# Patient Record
Sex: Female | Born: 1960 | Race: White | Hispanic: No | Marital: Single | Smoking: Current every day smoker
Health system: Southern US, Community
[De-identification: ages and names within clinical notes are randomized; demographics above are authoritative.]

## PROBLEM LIST (undated history)

## (undated) DIAGNOSIS — K5792 Diverticulitis of intestine, part unspecified, without perforation or abscess without bleeding: Secondary | ICD-10-CM

## (undated) DIAGNOSIS — J387 Other diseases of larynx: Secondary | ICD-10-CM

## (undated) DIAGNOSIS — M1990 Unspecified osteoarthritis, unspecified site: Secondary | ICD-10-CM

## (undated) DIAGNOSIS — K219 Gastro-esophageal reflux disease without esophagitis: Secondary | ICD-10-CM

## (undated) DIAGNOSIS — E039 Hypothyroidism, unspecified: Secondary | ICD-10-CM

## (undated) DIAGNOSIS — I10 Essential (primary) hypertension: Secondary | ICD-10-CM

## (undated) HISTORY — DX: Other diseases of larynx: J38.7

## (undated) HISTORY — PX: CHOLECYSTECTOMY: SHX55

## (undated) HISTORY — PX: APPENDECTOMY: SHX54

## (undated) HISTORY — PX: ABDOMINAL HYSTERECTOMY: SHX81

---

## 2005-08-16 ENCOUNTER — Encounter (INDEPENDENT_AMBULATORY_CARE_PROVIDER_SITE_OTHER): Payer: Self-pay | Admitting: *Deleted

## 2005-08-16 ENCOUNTER — Ambulatory Visit: Payer: Self-pay | Admitting: Gynecology

## 2005-08-16 ENCOUNTER — Ambulatory Visit (HOSPITAL_COMMUNITY): Admission: RE | Admit: 2005-08-16 | Discharge: 2005-08-16 | Payer: Self-pay | Admitting: Gynecology

## 2005-08-16 ENCOUNTER — Encounter: Payer: Self-pay | Admitting: Family Medicine

## 2005-09-06 ENCOUNTER — Ambulatory Visit: Payer: Self-pay | Admitting: Gynecology

## 2006-02-26 ENCOUNTER — Emergency Department (HOSPITAL_COMMUNITY): Admission: EM | Admit: 2006-02-26 | Discharge: 2006-02-27 | Payer: Self-pay | Admitting: Emergency Medicine

## 2006-11-10 ENCOUNTER — Ambulatory Visit: Payer: Self-pay | Admitting: Gynecology

## 2007-03-06 ENCOUNTER — Ambulatory Visit: Payer: Self-pay | Admitting: Gynecology

## 2007-07-11 ENCOUNTER — Emergency Department: Payer: Self-pay | Admitting: Emergency Medicine

## 2007-07-11 ENCOUNTER — Other Ambulatory Visit: Payer: Self-pay

## 2008-03-25 ENCOUNTER — Ambulatory Visit: Payer: Self-pay | Admitting: Gynecology

## 2009-02-02 ENCOUNTER — Inpatient Hospital Stay (HOSPITAL_COMMUNITY): Admission: AD | Admit: 2009-02-02 | Discharge: 2009-02-03 | Payer: Self-pay | Admitting: Obstetrics & Gynecology

## 2009-02-03 ENCOUNTER — Encounter: Payer: Self-pay | Admitting: Family Medicine

## 2009-02-03 ENCOUNTER — Ambulatory Visit: Payer: Self-pay | Admitting: Family Medicine

## 2009-02-03 LAB — CONVERTED CEMR LAB
Amylase: 50 units/L (ref 0–105)
Lipase: 42 units/L (ref 0–75)

## 2009-02-05 ENCOUNTER — Encounter: Payer: Self-pay | Admitting: Family Medicine

## 2009-02-05 LAB — CONVERTED CEMR LAB: T3, Free: 2.6 pg/mL (ref 2.3–4.2)

## 2009-03-19 ENCOUNTER — Ambulatory Visit: Payer: Self-pay | Admitting: Internal Medicine

## 2009-04-30 ENCOUNTER — Encounter: Payer: Self-pay | Admitting: Family Medicine

## 2009-04-30 LAB — CONVERTED CEMR LAB: TSH: 4.998 microintl units/mL

## 2009-05-05 ENCOUNTER — Encounter: Payer: Self-pay | Admitting: Family Medicine

## 2009-05-05 ENCOUNTER — Ambulatory Visit: Payer: Self-pay | Admitting: Obstetrics and Gynecology

## 2009-05-05 LAB — CONVERTED CEMR LAB: T3, Free: 0.91 pg/mL

## 2010-02-26 ENCOUNTER — Encounter: Payer: Self-pay | Admitting: Family Medicine

## 2010-02-26 LAB — CONVERTED CEMR LAB: TSH: 0.52 microintl units/mL (ref 0.350–4.500)

## 2010-03-03 ENCOUNTER — Ambulatory Visit: Payer: Self-pay | Admitting: Family Medicine

## 2010-03-03 DIAGNOSIS — Z9189 Other specified personal risk factors, not elsewhere classified: Secondary | ICD-10-CM

## 2010-03-03 DIAGNOSIS — E039 Hypothyroidism, unspecified: Secondary | ICD-10-CM | POA: Insufficient documentation

## 2010-03-03 DIAGNOSIS — I10 Essential (primary) hypertension: Secondary | ICD-10-CM | POA: Insufficient documentation

## 2010-03-03 DIAGNOSIS — K921 Melena: Secondary | ICD-10-CM

## 2010-03-03 DIAGNOSIS — F411 Generalized anxiety disorder: Secondary | ICD-10-CM | POA: Insufficient documentation

## 2010-03-16 ENCOUNTER — Encounter: Payer: Self-pay | Admitting: Family Medicine

## 2010-05-24 ENCOUNTER — Inpatient Hospital Stay (HOSPITAL_COMMUNITY): Admission: EM | Admit: 2010-05-24 | Discharge: 2010-05-26 | Payer: Self-pay | Source: Home / Self Care

## 2010-05-25 ENCOUNTER — Encounter (INDEPENDENT_AMBULATORY_CARE_PROVIDER_SITE_OTHER): Payer: Self-pay

## 2010-06-02 ENCOUNTER — Emergency Department (HOSPITAL_COMMUNITY)
Admission: EM | Admit: 2010-06-02 | Discharge: 2010-06-02 | Payer: Self-pay | Source: Home / Self Care | Admitting: Emergency Medicine

## 2010-07-07 NOTE — Miscellaneous (Signed)
  Clinical Lists Changes  Observations: Added new observation of TSH: 0.520 microintl units/mL (02/26/2010 10:05) Added new observation of T3 FREE: 0.91 pg/mL (05/05/2009 10:05) Added new observation of TSH: 4.998 microintl units/mL (04/30/2009 10:05) Added new observation of TSH: 6.614 microintl units/mL (02/03/2009 10:05)

## 2010-07-07 NOTE — Assessment & Plan Note (Signed)
Summary: NEW PT TO EST/THROAT PAIN/CLE   Vital Signs:  Patient profile:   50 year old female Height:      58 inches Weight:      120.75 pounds BMI:     25.33 Temp:     98.4 degrees F oral Pulse rate:   88 / minute Pulse rhythm:   regular BP sitting:   130 / 88  (left arm) Cuff size:   regular  Vitals Entered By: Delilah Shan CMA Duncan Dull) (March 03, 2010 9:32 AM) CC: New Patient to Establish   History of Present Illness: Blood in stool, happened 3 weeks ago. No pain with episode.  1 prev episode.  Occurred with straining for BM.   MDD- prev on effexor.  Had good effect.  Off the med, patient is fatigued.  No SI/HI.  "I did better on it."  H/o hypothyroidism, recent TSH wnl.    Pain throat down into chest, happens every other day.  Lasts about 2 hours.  +nausea w/o vomiting.  Feel hot with the episodes.  L arm and L leg feel "different when it happens."  Can happen at rest.  Nothing makes it better.  She can exercise w/o pain.  Irregular patter- "it happens when it happens."  Has been going on about 1.5 months.  No motor changes.  No h/o CAD.  No help with tums, aleve.  Taking BC powder every AM.    Berneta Levins off smoking.    Preventive Screening-Counseling & Management  Alcohol-Tobacco     Smoking Status: current  Caffeine-Diet-Exercise     Does Patient Exercise: yes      Drug Use:  no.    Problems Prior to Update: 1)  Anxiety  (ICD-300.00) 2)  Chest Pain, Atypical, Hx of  (ICD-V15.89) 3)  Blood in Stool  (ICD-578.1) 4)  Hypothyroidism  (ICD-244.9) 5)  Hypertension  (ICD-401.9)  Current Medications (verified): 1)  Levothyroxine Sodium 50 Mcg Tabs (Levothyroxine Sodium) .... Take 1 Tablet By Mouth Once A Day 2)  Effexor Xr 37.5 Mg Xr24h-Cap (Venlafaxine Hcl) .... Take 1 Capsule By Mouth Once A Day 3)  Premarin 1.25 Mg Tabs (Estrogens Conjugated) .... Take 1 Tablet By Mouth Once A Day  Allergies (verified): No Known Drug Allergies  Past History:  Past Medical  History: HYPOTHYROIDISM (ICD-244.9) HYPERTENSION (ICD-401.9) Depression- prev in citalopram, changed to effexor.   Menopausal symptoms on HRT per Dr. Shawnie Pons tobacco abuse likely GERD  Past Surgical History: 2006   Hysterectomy  Family History: Reviewed history and no changes required. Family History Breast cancer 1st degree relative <50, parents Family History Lung cancer, parents Family History of Stroke F 1st degree relative <60, parents M dead multiple types of  CA, some of unknown type, died in mid62s from throat CA F alive, healthy 4 sisters alive 1 sister dead alcohol/abuse  Social History: Reviewed history and no changes required. 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  no murmur PULM: ctab, no inc wob ABD: soft, +bs  EXT: no edema SKIN: no acute rash  chaperoned exam: ext hemorroids noted w/o gross blood.    Impression & Recommendations:  Problem # 1:  BLOOD IN STOOL (ICD-578.1) Old ext hemorrhoids noted.  rec follow up if persistent.  Stool softener in meantime.  Avoid straining.  If persistent BRBPR, would need GI eval.   Problem # 2:  HYPOTHYROIDISM (ICD-244.9) Recent TSH wnl, no change in meds.  Her updated medication list for this problem includes:    Levothyroxine Sodium 50 Mcg Tabs (Levothyroxine sodium) .Marland Kitchen... Take 1 tablet by mouth once a day  Problem # 3:  CHEST PAIN, ATYPICAL, HX OF (ICD-V15.89) Likely GERD.  Stop BC powders- prev taken  daily- and stop other NSAIDS.  Use tylenol as needed.  Elevated head of bed. prilosec 20mg  a day, #28 samples given.  follow up as needed.    Problem # 4:  ANXIETY (ICD-300.00) Restart effexor and follow up as needed.  She agrees.  No SI/HI.  The following medications were removed from the medication list:    Citalopram Hydrobromide 20 Mg Tabs (Citalopram hydrobromide) .Marland Kitchen... Take 1 tablet by mouth once a day Her updated medication list for this problem includes:    Effexor Xr 37.5 Mg Xr24h-cap (Venlafaxine hcl) .Marland Kitchen... Take 1 capsule by mouth once a day  Complete Medication List: 1)  Levothyroxine Sodium 50 Mcg Tabs (Levothyroxine sodium) .... Take 1 tablet by mouth once a day 2)  Effexor Xr 37.5 Mg Xr24h-cap (Venlafaxine hcl) .... Take 1 capsule by mouth once a day 3)  Premarin 1.25 Mg Tabs (Estrogens conjugated) .... Take 1 tablet by mouth once a day  Patient Instructions: 1)  Take the prilosec 1 tab a day and see if that makes a difference in the feeling in your chest.  Raise the head of your bed by putting 2 bricks under your headboard.  Let me know if you aren't feeling better.  Keep working on stopping smoking.  Take care.  Prescriptions: EFFEXOR XR 37.5 MG XR24H-CAP (VENLAFAXINE HCL) Take 1 capsule by mouth once a day  #90 x 3   Entered and Authorized by:   Crawford Givens MD   Signed by:   Crawford Givens MD on 03/03/2010   Method used:   Print then Give to Patient   RxID:   [id number] LEVOTHYROXINE SODIUM 50 MCG TABS (LEVOTHYROXINE SODIUM) Take 1 tablet by mouth once a day  #90 x 3   Entered and Authorized by:   Crawford Givens MD   Signed by:   Crawford Givens MD on 03/03/2010   Method used:   Print then Give to Patient   RxID:   [id number]   Current Allergies (reviewed today): No known allergies     Preventive Care Screening  Mammogram:    Date:  06/07/2008    Results:  Done   Pap Smear:    Date:  06/07/2008    Results:  Done

## 2010-08-17 LAB — COMPREHENSIVE METABOLIC PANEL
ALT: 12 U/L (ref 0–35)
AST: 27 U/L (ref 0–37)
Albumin: 4.3 g/dL (ref 3.5–5.2)
Alkaline Phosphatase: 85 U/L (ref 39–117)
Alkaline Phosphatase: 85 U/L (ref 39–117)
BUN: 20 mg/dL (ref 6–23)
CO2: 21 mEq/L (ref 19–32)
Calcium: 10.2 mg/dL (ref 8.4–10.5)
Chloride: 104 mEq/L (ref 96–112)
Creatinine, Ser: 1.14 mg/dL (ref 0.4–1.2)
Creatinine, Ser: 1.22 mg/dL — ABNORMAL HIGH (ref 0.4–1.2)
GFR calc Af Amer: 57 mL/min — ABNORMAL LOW (ref >60)
GFR calc Af Amer: >60 mL/min (ref >60)
GFR calc non Af Amer: 47 mL/min — ABNORMAL LOW (ref >60)
Glucose, Bld: 108 mg/dL — ABNORMAL HIGH (ref 70–99)
Potassium: 4.7 mEq/L (ref 3.5–5.1)
Potassium: 4.8 mEq/L (ref 3.5–5.1)
Total Bilirubin: 0.6 mg/dL (ref 0.3–1.2)
Total Bilirubin: 1.1 mg/dL (ref 0.3–1.2)
Total Protein: 8.1 g/dL (ref 6.0–8.3)

## 2010-08-17 LAB — DIFFERENTIAL
Basophils Absolute: 0.1 10*3/uL (ref 0.0–0.1)
Basophils Absolute: 0.1 10*3/uL (ref 0.0–0.1)
Basophils Relative: 1 % (ref 0–1)
Eosinophils Absolute: 0.5 10*3/uL (ref 0.0–0.7)
Eosinophils Relative: 5 % (ref 0–5)
Lymphocytes Relative: 28 % (ref 12–46)
Lymphocytes Relative: 29 % (ref 12–46)
Monocytes Absolute: 1 10*3/uL (ref 0.1–1.0)
Monocytes Relative: 8 % (ref 3–12)
Neutro Abs: 7.1 10*3/uL (ref 1.7–7.7)

## 2010-08-17 LAB — CBC
Hemoglobin: 14.7 g/dL (ref 12.0–15.0)
MCH: 34.7 pg — ABNORMAL HIGH (ref 26.0–34.0)
MCH: 34.9 pg — ABNORMAL HIGH (ref 26.0–34.0)
MCHC: 34.1 g/dL (ref 30.0–36.0)
MCV: 101.7 fL — ABNORMAL HIGH (ref 78.0–100.0)
MCV: 101.8 fL — ABNORMAL HIGH (ref 78.0–100.0)
Platelets: 296 10*3/uL (ref 150–400)
RBC: 4.21 MIL/uL (ref 3.87–5.11)
RDW: 13.1 % (ref 11.5–15.5)
WBC: 11.9 10*3/uL — ABNORMAL HIGH (ref 4.0–10.5)
WBC: 9.5 10*3/uL (ref 4.0–10.5)

## 2010-08-17 LAB — URINALYSIS, ROUTINE W REFLEX MICROSCOPIC
Bilirubin Urine: NEGATIVE
Bilirubin Urine: NEGATIVE
Hgb urine dipstick: NEGATIVE
Hgb urine dipstick: NEGATIVE
Ketones, ur: NEGATIVE mg/dL
Nitrite: NEGATIVE
Protein, ur: NEGATIVE mg/dL
Protein, ur: NEGATIVE mg/dL
Specific Gravity, Urine: 1.012 (ref 1.005–1.030)
Urobilinogen, UA: 0.2 mg/dL (ref 0.0–1.0)
Urobilinogen, UA: 0.2 mg/dL (ref 0.0–1.0)

## 2010-08-17 LAB — URINE CULTURE

## 2010-08-17 LAB — LIPASE, BLOOD: Lipase: 35 U/L (ref 11–59)

## 2010-09-12 LAB — CBC
HCT: 44.7 % (ref 36.0–46.0)
MCV: 104.8 fL — ABNORMAL HIGH (ref 78.0–100.0)
Platelets: 162 10*3/uL (ref 150–400)
RDW: 12.8 % (ref 11.5–15.5)
WBC: 7.3 10*3/uL (ref 4.0–10.5)

## 2010-09-12 LAB — DIFFERENTIAL
Basophils Absolute: 0 10*3/uL (ref 0.0–0.1)
Lymphocytes Relative: 28 % (ref 12–46)
Monocytes Absolute: 0.4 10*3/uL (ref 0.1–1.0)
Monocytes Relative: 6 % (ref 3–12)
Neutro Abs: 4.4 10*3/uL (ref 1.7–7.7)

## 2010-09-12 LAB — COMPREHENSIVE METABOLIC PANEL
AST: 23 U/L (ref 0–37)
Albumin: 3.9 g/dL (ref 3.5–5.2)
Calcium: 9.6 mg/dL (ref 8.4–10.5)
Creatinine, Ser: 0.65 mg/dL (ref 0.4–1.2)
GFR calc Af Amer: >60 mL/min (ref >60)
Total Protein: 7.1 g/dL (ref 6.0–8.3)

## 2010-09-12 LAB — URINALYSIS, ROUTINE W REFLEX MICROSCOPIC
Glucose, UA: NEGATIVE mg/dL
Leukocytes, UA: NEGATIVE
Protein, ur: NEGATIVE mg/dL
Specific Gravity, Urine: <1.005 — ABNORMAL LOW (ref 1.005–1.030)

## 2010-09-12 LAB — WET PREP, GENITAL
Trich, Wet Prep: NONE SEEN
Yeast Wet Prep HPF POC: NONE SEEN

## 2010-09-12 LAB — URINE MICROSCOPIC-ADD ON

## 2010-10-20 NOTE — Assessment & Plan Note (Signed)
[name], [name]               ACCOUNT NO.:  [id number]   MEDICAL RECORD NO.:  [id number]          PATIENT TYPE:  POB   LOCATION:  [facility] at [facility]         FACILITY:  [facility]   PHYSICIAN:  Tinnie Gens, MD        DATE OF BIRTH:  1960-09-25   DATE OF SERVICE:  02/03/2009                                  CLINIC NOTE   CHIEF COMPLAINT:  Abdominal pain.   HISTORY OF PRESENT ILLNESS:  The patient is a 50 year old gravida 3,  para 2-0-1-2 who has history of a hysterectomy with LSO followed by RSO  plus appendectomy for chronic pelvic pain.  The patient has been on  Premarin and Effexor; however, she has been without her Effexor since  for some time and she is waiting to get back to the medication  assistance program.  The patient has also not had any further blood  pressure medicine.  I have instructed her previously that her blood  pressure is exceptionally high, put on HCTZ, referred her for primary  care that she has not follow up with.  She states that that is because  she does not have health insurance and is unable to find a primary care  physician.  She went to the [facility] yesterday with abdominal pain.  She  threw up in the parking lot and stated that she felt better.  Her  abdominal pain is sharp in nature, sort of in the middle, it is upper  and lower abdominal pain, is associated with bloating.  She does  occasionally have blood in her stool.  The patient reports that she has  to hold her abdomen to empty her bladder.  The patient denies  significant vaginal bleeding or diarrhea.  The patient does report  vaginal odor.  She was diagnosed with BV at the hospital and was given a  prescription for Flagyl which she has not filled.  The patient also has  long history of alcohol and tobacco use.   On exam today, her blood pressure is elevated at 175/99, weight is 116.  She is an elderly-appearing female who looks much older than stated age.  She is thin.  She has some mild  bloating of her abdomen.  Liver is not  palpable.  I do not have great evidence of shifting waves.  Pelvic, she  has good support of the uterus and cervix.  Her adnexa are absent.   IMPRESSION:  1. Bacterial vaginitis.  2. Abdominal pain with bloating.   PLAN:  1. The patient also complains of hair loss.  We will check TSH, should      check amylase, lipase. We did check her LFTs which were normal.  We      will get a PT as well today.  2. Referral for right upper quadrant ultrasound to see if she has      gallbladder disease and could consider a CT of the abdomen should      that not be diagnostic.  3. Hypertension.  I have given her HCTZ again with 2 refills 25 mg and      I have cautioned her about stroke range blood  pressure and the need      for a primary care physician.  4. Long-term alcohol use.  I wonder if this is causing her abdominal      pain.  As stated, we will obtain the above-mentioned labs and have      her follow up with GI or primary care.           ______________________________  Tinnie Gens, MD     TP/MEDQ  D:  02/03/2009  T:  02/04/2009  Job:  [id number]

## 2010-10-20 NOTE — Assessment & Plan Note (Signed)
NAME:  [name], [name]               ACCOUNT NO.:  [id number]   MEDICAL RECORD NO.:  [id number]          PATIENT TYPE:  POB   LOCATION:  [facility] at [facility]         FACILITY:  [facility]   PHYSICIAN:  Tinnie Gens, MD        DATE OF BIRTH:  Jan 28, 1961   DATE OF SERVICE:  03/25/2008                                  CLINIC NOTE   CHIEF COMPLAINT:  Yearly exam.   HISTORY OF PRESENT ILLNESS:  The patient is a 50 year old gravida 3,  para 2-0-1-2 who has undergone hysterectomy, LSO and RSO plus  appendectomy for chronic pelvic pain.  The patient continues to have  some mild pain in the left lower quadrant greater than the right lower  quadrant especially with coughing.  The patient is currently on Effexor  75 mg a day and Premarin 1.25 mg a day.  It feels like this works well  for her.  She would like to get back on her Xanax because she thinks it  helps level her hormones out.  She usually takes 1 of these a day.  The  patient has been off it for the past year since she was referred to a  psychiatrist for treatment.  The patient is also interested in smoking  cessation.  She verbalizes this.  Her job is going to go to a smoke-free  environment in January and she is definitely interested in cutting down.   PAST MEDICAL HISTORY:  Essentially negative; however, the last 3 visits  here, she has had elevated blood pressures every time including 180/90  today.   PAST SURGICAL HISTORY:  She has undergone laparoscopic BTL, did LSO, RSO  plus appendectomy.   ALLERGIES:  None known.   MEDICATIONS:  1. Premarin 1.25 mg p.o. daily.  2. Effexor 25 mg p.o. daily.   OBSTETRICAL HISTORY:  She is G3, P2 with a 1 therapeutic AB.   FAMILY HISTORY:  Throat cancer in her mother.   SOCIAL HISTORY:  Long time tobacco use.  She reports one half pack per  day, although she reports waking up in the middle of the night to smoke  every hour on the hour.  She also drinks up to 6 beers a day; however,  she denies  having any trouble with alcohol use.  She has no DUIs, is  however not going to work.  She gets up and is at work by 4:30 every  morning.   REVIEW OF SYSTEMS:  A 14-point review of systems reviewed.  She does  have headache.  She reports having some dizzy spells and spots in front  of her eyes and some shortness of breath.  She denies hematuria,  dysuria, or chest pain.  She reports some shortness of breath with  exertion.  She has some hand pain and tendinitis, sounds like in her  left thumb.  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  She  denies nausea, vomiting, or abdominal pain.  She occasionally sees red  when she wipes but that is only after straining.  No hematochezia or  melena is noted.   PHYSICAL EXAMINATION:  VITAL SIGNS:  Her  vitals are as noted in the  chart.  The blood pressure is 180/90 and weight is 117.  GENERAL:  She is a well-developed, well-nourished female who looks older  than her stated age.  LUNGS:  Clear bilaterally.  CV:  Regular rate and rhythm.  No rubs, gallops, or murmurs.  NECK:  Supple.  Normal thyroid.  HEENT:  Normocephalic and atraumatic.  Sclerae anicteric.  ABDOMEN:  Soft, nontender, and nondistended.  No masses.  No  organomegaly.  EXTREMITIES:  No cyanosis, clubbing, or edema.  A 2+ distal pulses.  GU:  Normal external female genitalia.  BUS is normal.  Vagina is pink  and rugated.  Cervix, uterus, and adnexa are surgically absent.  She  does have tenderness diffusely throughout the pelvis.   IMPRESSION:  1. Yearly exam status post hysterectomy.  2. Markedly elevated blood pressure   PLAN:  1. Start hydrochlorothiazide 25 mg p.o. daily, refilled Premarin 1.25      mg daily, Effexor 75 mg p.o. daily, and Xanax 0.5 mg 1 p.o. daily      as needed.  2. Referred to primary care physician.  3. Mammogram.  4. Possible referral to hand surgeon if necessary.           ______________________________  Tinnie Gens, MD      TP/MEDQ  D:  03/25/2008  T:  03/26/2008  Job:  [id number]

## 2010-10-23 NOTE — Op Note (Signed)
[name], [name]               ACCOUNT NO.:  [id number]   MEDICAL RECORD NO.:  [id number]          PATIENT TYPE:  AMB   LOCATION:  [facility]                           FACILITY:  [facility]   PHYSICIAN:  Ginger Carne, MD  DATE OF BIRTH:  Jun 21, 1960   DATE OF PROCEDURE:  DATE OF DISCHARGE:                                 OPERATIVE REPORT   Audio too short to transcribe (less than 5 seconds)      Ginger Carne, MD     SHB/MEDQ  D:  08/16/2005  T:  08/16/2005  Job:  [id number]

## 2010-10-23 NOTE — Op Note (Signed)
NAMESAFFRON, BUSEY               ACCOUNT NO.:  [id number]   MEDICAL RECORD NO.:  [id number]          PATIENT TYPE:  AMB   LOCATION:  [facility]                           FACILITY:  [facility]   PHYSICIAN:  Ginger Carne, MD  DATE OF BIRTH:  [birth year]   DATE OF PROCEDURE:  08/16/2005  DATE OF DISCHARGE:                                 OPERATIVE REPORT   PREOPERATIVE DIAGNOSIS:  Chronic pelvic pain.   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   OPERATIVE PROCEDURE:  Laparoscopic lysis of pelvic peritoneal intestinal  adhesions, laparoscopic right salpingo-oophorectomy, and laparoscopic  appendectomy.   SURGEON:  Ginger Carne, M.D.   ASSISTANT:  None.   COMPLICATIONS:  None immediate.   ESTIMATED BLOOD LOSS:  Minimal.   SPECIMEN:  Right tube, ovary and appendix.   OPERATIVE FINDINGS:  The patient has had a previously excised uterus,  cervix, left tube and ovary despite operative report from [facility] by another physician indicating the patient had a right  salpingo-oophorectomy at the time of her abdominal hysterectomy with  preservation of left tube and ovary. Careful inspection with photography of  the left pelvic sidewall, tracing to the base of the infundibulopelvic  ligament demonstrated no evidence for ovarian tissue. Rectosigmoid was  carefully dissected away from the sidewall and there was no evidence of an  ovary hidden under said structure. The right tube and ovary were  significantly adherent to its respective sidewall. Portion of the distal  ileum made a 180 degrees loop with attachment to the ovary and the  respective sidewall overlying the right external iliac artery. The appendix  appeared to be significantly thickened, swollen with a thickened  mesoappendix consistent with subacute appendicitis. Upper abdomen normal.  Large and small bowel otherwise grossly  normal.   OPERATIVE PROCEDURE:  The patient prepped and draped in the usual fashion  and placed in lithotomy position. Betadine solution used for antiseptic and  the patient was catheterized prior to procedure. After adequate general  anesthesia a vertical infraumbilical incision was made. The Veress needle  was placed in the abdomen. Opening and closing pressures were 10-15 mmHg.  Needle released and trocar placed through the same incision. Laparoscope  placed through the trocar sleeve. Two 5 mm ports were made in the left lower  quadrant, left hypogastric regions respectively. After carefully identifying  the ureters bilaterally, the left pelvic sidewall was meticulously inspected  and dissected. The rectosigmoid was flipped over to assure that portions of  the left tube and ovary were not hidden under said structure. There were  areas of adhesions of the rectosigmoid and the mesosigmoid attached to the  bladder and the vaginal cuff and these were carefully dissected free. The  loop of distal ileum was dissected off the right ovary in addition to the  sidewall and no injury to the serosa was noted. After reidentifying the  ureter on the right pelvic sidewall. The ovary was carefully dissected off  its respective sidewall. Infundibulopelvic ligament bipolar cauterized and  cut with  the remainder of the ovary dissected off said sidewall. This was  removed at a later time through an Endopouch bag.   The appendix was then identified. Mesoappendix bipolar cauterized to the  base and no bleeding noted. 2-0 Vicryl loop ties were placed at the base.  Another about 7 mm above the first two. Appendix and cut above the first two  ties and removed through an Endopouch bag. Copious irrigation of the base  with lactated Ringer's followed. Irrigant removed. No active bleeding noted.  As mentioned earlier, the ovary and tube were then similarly removed with an  Endopouch bag. No bleeding noted on the  IP ligament of the right side. More  irrigation performed with irrigant removed. Afterwards gas released, trocars  removed. Closure of a 10 mm fascia site with 0 Vicryl suture and 4-0 Vicryl  for subcuticular closure. Instrument and sponge count were correct. The  patient tolerated the procedure well, returned to post anesthesia recovery  room in excellent condition.      Ginger Carne, MD  Electronically Signed     SHB/MEDQ  D:  08/16/2005  T:  08/17/2005  Job:  [id number]

## 2011-01-09 ENCOUNTER — Emergency Department (HOSPITAL_COMMUNITY): Payer: Self-pay

## 2011-01-09 ENCOUNTER — Emergency Department (HOSPITAL_COMMUNITY)
Admission: EM | Admit: 2011-01-09 | Discharge: 2011-01-10 | Disposition: A | Discharge status: Home / Self Care | Payer: Self-pay | Attending: Emergency Medicine | Admitting: Emergency Medicine

## 2011-01-09 DIAGNOSIS — R110 Nausea: Secondary | ICD-10-CM | POA: Insufficient documentation

## 2011-01-09 DIAGNOSIS — W010XXA Fall on same level from slipping, tripping and stumbling without subsequent striking against object, initial encounter: Secondary | ICD-10-CM | POA: Insufficient documentation

## 2011-01-09 DIAGNOSIS — S2239XA Fracture of one rib, unspecified side, initial encounter for closed fracture: Secondary | ICD-10-CM | POA: Insufficient documentation

## 2011-01-09 DIAGNOSIS — R0989 Other specified symptoms and signs involving the circulatory and respiratory systems: Secondary | ICD-10-CM | POA: Insufficient documentation

## 2011-01-09 DIAGNOSIS — R071 Chest pain on breathing: Secondary | ICD-10-CM | POA: Insufficient documentation

## 2011-01-09 DIAGNOSIS — Z9089 Acquired absence of other organs: Secondary | ICD-10-CM | POA: Insufficient documentation

## 2011-01-09 DIAGNOSIS — R0609 Other forms of dyspnea: Secondary | ICD-10-CM | POA: Insufficient documentation

## 2011-01-10 ENCOUNTER — Encounter: Payer: Self-pay | Admitting: Family Medicine

## 2011-01-10 DIAGNOSIS — S2239XA Fracture of one rib, unspecified side, initial encounter for closed fracture: Secondary | ICD-10-CM | POA: Insufficient documentation

## 2011-04-30 ENCOUNTER — Other Ambulatory Visit: Payer: Self-pay | Admitting: *Deleted

## 2011-04-30 MED ORDER — LEVOTHYROXINE SODIUM 50 MCG PO TABS: 50.0000 ug | ORAL_TABLET | Freq: Every day | ORAL | Status: DC

## 2011-09-24 ENCOUNTER — Emergency Department: Payer: Self-pay | Admitting: *Deleted

## 2011-09-24 LAB — COMPREHENSIVE METABOLIC PANEL
Albumin: 4.3 g/dL (ref 3.4–5.0)
Alkaline Phosphatase: 119 U/L (ref 50–136)
Bilirubin,Total: 0.3 mg/dL (ref 0.2–1.0)
Calcium, Total: 9.3 mg/dL (ref 8.5–10.1)
Chloride: 105 mmol/L (ref 98–107)
Co2: 26 mmol/L (ref 21–32)
EGFR (African American): >60
Glucose: 102 mg/dL — ABNORMAL HIGH (ref 65–99)
Osmolality: 282 (ref 275–301)
Potassium: 3.2 mmol/L — ABNORMAL LOW (ref 3.5–5.1)
SGOT(AST): 28 U/L (ref 15–37)

## 2011-09-24 LAB — CBC
HCT: 48.1 % — ABNORMAL HIGH (ref 35.0–47.0)
HGB: 16.5 g/dL — ABNORMAL HIGH (ref 12.0–16.0)
MCH: 36.6 pg — ABNORMAL HIGH (ref 26.0–34.0)
MCV: 107 fL — ABNORMAL HIGH (ref 80–100)
Platelet: 210 10*3/uL (ref 150–440)
RDW: 12.5 % (ref 11.5–14.5)
WBC: 11.5 10*3/uL — ABNORMAL HIGH (ref 3.6–11.0)

## 2011-09-24 LAB — CK TOTAL AND CKMB (NOT AT ARMC)
CK, Total: 51 U/L (ref 21–215)
CK-MB: 0.7 ng/mL (ref 0.5–3.6)

## 2011-09-24 LAB — TROPONIN I: Troponin-I: <0.02 ng/mL

## 2011-09-25 LAB — LIPASE, BLOOD: Lipase: 251 U/L (ref 73–393)

## 2011-12-30 ENCOUNTER — Emergency Department: Payer: Self-pay | Admitting: Emergency Medicine

## 2011-12-30 LAB — URINALYSIS, COMPLETE
Bilirubin,UR: NEGATIVE
Glucose,UR: NEGATIVE mg/dL (ref 0–75)
Hyaline Cast: 16
Ph: 6 (ref 4.5–8.0)
Protein: NEGATIVE
RBC,UR: 2 /HPF (ref 0–5)
Specific Gravity: 1.008 (ref 1.003–1.030)
Squamous Epithelial: <1
WBC UR: 1 /HPF (ref 0–5)

## 2011-12-30 LAB — COMPREHENSIVE METABOLIC PANEL
Albumin: 4.1 g/dL (ref 3.4–5.0)
Anion Gap: 10 (ref 7–16)
Calcium, Total: 9.7 mg/dL (ref 8.5–10.1)
Chloride: 101 mmol/L (ref 98–107)
Co2: 29 mmol/L (ref 21–32)
Creatinine: 0.83 mg/dL (ref 0.60–1.30)
EGFR (African American): >60
EGFR (Non-African Amer.): >60
Glucose: 124 mg/dL — ABNORMAL HIGH (ref 65–99)
Osmolality: 280 (ref 275–301)
Potassium: 2.8 mmol/L — ABNORMAL LOW (ref 3.5–5.1)
Sodium: 140 mmol/L (ref 136–145)

## 2011-12-30 LAB — CBC
HGB: 16.4 g/dL — ABNORMAL HIGH (ref 12.0–16.0)
MCH: 36 pg — ABNORMAL HIGH (ref 26.0–34.0)
MCHC: 34.6 g/dL (ref 32.0–36.0)
Platelet: 279 10*3/uL (ref 150–440)
RBC: 4.54 10*6/uL (ref 3.80–5.20)
RDW: 12.4 % (ref 11.5–14.5)
WBC: 12.3 10*3/uL — ABNORMAL HIGH (ref 3.6–11.0)

## 2011-12-30 LAB — CLOSTRIDIUM DIFFICILE BY PCR

## 2011-12-31 LAB — WBCS, STOOL

## 2012-01-06 ENCOUNTER — Inpatient Hospital Stay (HOSPITAL_COMMUNITY)
Admission: EM | Admit: 2012-01-06 | Discharge: 2012-01-09 | DRG: 378 | Disposition: A | Discharge status: Home / Self Care | Payer: MEDICAID | Attending: Internal Medicine | Admitting: Internal Medicine

## 2012-01-06 ENCOUNTER — Encounter (HOSPITAL_COMMUNITY): Payer: Self-pay | Admitting: Emergency Medicine

## 2012-01-06 DIAGNOSIS — K5730 Diverticulosis of large intestine without perforation or abscess without bleeding: Secondary | ICD-10-CM | POA: Diagnosis present

## 2012-01-06 DIAGNOSIS — D126 Benign neoplasm of colon, unspecified: Secondary | ICD-10-CM | POA: Diagnosis present

## 2012-01-06 DIAGNOSIS — F17200 Nicotine dependence, unspecified, uncomplicated: Secondary | ICD-10-CM | POA: Diagnosis present

## 2012-01-06 DIAGNOSIS — D72829 Elevated white blood cell count, unspecified: Secondary | ICD-10-CM | POA: Diagnosis present

## 2012-01-06 DIAGNOSIS — D649 Anemia, unspecified: Secondary | ICD-10-CM

## 2012-01-06 DIAGNOSIS — K922 Gastrointestinal hemorrhage, unspecified: Principal | ICD-10-CM | POA: Diagnosis present

## 2012-01-06 DIAGNOSIS — K219 Gastro-esophageal reflux disease without esophagitis: Secondary | ICD-10-CM | POA: Diagnosis present

## 2012-01-06 DIAGNOSIS — Z7982 Long term (current) use of aspirin: Secondary | ICD-10-CM

## 2012-01-06 DIAGNOSIS — F411 Generalized anxiety disorder: Secondary | ICD-10-CM

## 2012-01-06 DIAGNOSIS — K2970 Gastritis, unspecified, without bleeding: Secondary | ICD-10-CM | POA: Diagnosis present

## 2012-01-06 DIAGNOSIS — R9431 Abnormal electrocardiogram [ECG] [EKG]: Secondary | ICD-10-CM | POA: Diagnosis present

## 2012-01-06 DIAGNOSIS — F1010 Alcohol abuse, uncomplicated: Secondary | ICD-10-CM | POA: Diagnosis present

## 2012-01-06 DIAGNOSIS — E039 Hypothyroidism, unspecified: Secondary | ICD-10-CM | POA: Diagnosis present

## 2012-01-06 DIAGNOSIS — Z9189 Other specified personal risk factors, not elsewhere classified: Secondary | ICD-10-CM

## 2012-01-06 DIAGNOSIS — D62 Acute posthemorrhagic anemia: Secondary | ICD-10-CM | POA: Diagnosis present

## 2012-01-06 DIAGNOSIS — K259 Gastric ulcer, unspecified as acute or chronic, without hemorrhage or perforation: Secondary | ICD-10-CM | POA: Diagnosis present

## 2012-01-06 DIAGNOSIS — S2239XA Fracture of one rib, unspecified side, initial encounter for closed fracture: Secondary | ICD-10-CM

## 2012-01-06 DIAGNOSIS — E876 Hypokalemia: Secondary | ICD-10-CM | POA: Diagnosis present

## 2012-01-06 DIAGNOSIS — I959 Hypotension, unspecified: Secondary | ICD-10-CM | POA: Diagnosis present

## 2012-01-06 DIAGNOSIS — K5289 Other specified noninfective gastroenteritis and colitis: Secondary | ICD-10-CM | POA: Diagnosis present

## 2012-01-06 DIAGNOSIS — K921 Melena: Secondary | ICD-10-CM

## 2012-01-06 DIAGNOSIS — I10 Essential (primary) hypertension: Secondary | ICD-10-CM | POA: Diagnosis present

## 2012-01-06 HISTORY — DX: Gastro-esophageal reflux disease without esophagitis: K21.9

## 2012-01-06 HISTORY — DX: Unspecified osteoarthritis, unspecified site: M19.90

## 2012-01-06 HISTORY — DX: Essential (primary) hypertension: I10

## 2012-01-06 HISTORY — DX: Hypothyroidism, unspecified: E03.9

## 2012-01-06 MED ORDER — PANTOPRAZOLE SODIUM 40 MG IV SOLR
40.0000 mg | INTRAVENOUS | Status: AC
  Administered 2012-01-07: 40 mg via INTRAVENOUS
  Filled 2012-01-06: qty 40

## 2012-01-06 MED ORDER — ONDANSETRON HCL 4 MG/2ML IJ SOLN
INTRAMUSCULAR | Status: AC
  Filled 2012-01-06: qty 2

## 2012-01-06 MED ORDER — SODIUM CHLORIDE 0.9 % IV BOLUS (SEPSIS)
1000.0000 mL | Freq: Once | INTRAVENOUS | Status: AC
  Administered 2012-01-06: 1000 mL via INTRAVENOUS

## 2012-01-06 MED ORDER — SODIUM CHLORIDE 0.9 % IV SOLN
8.0000 mg/h | INTRAVENOUS | Status: AC
  Administered 2012-01-07: 8 mg/h via INTRAVENOUS
  Filled 2012-01-06: qty 80

## 2012-01-06 NOTE — ED Provider Notes (Addendum)
History     CSN: [id number]  Arrival date & time 01/06/12  2257   First MD Initiated Contact with Patient 01/06/12 2258      Chief Complaint  Patient presents with  . Rectal Bleeding    (Consider location/radiation/quality/duration/timing/severity/associated sxs/prior treatment) HPI Comments: 51 year old female with history of tobacco abuse, heavy aspirin use she takes 2 BC powders a day for approximately 15 years who presents with approximately 2 weeks of rectal bleeding. She states it has been getting worse over the last several days it's been a constant flow of dark red blood and extensive blood clot. She is not having bowel movements, has no rectal pain but has a constant feeling of tenesmus. She's been seen in to outside hospital, she reports having a CT scan of her abdomen one week ago and started on an antibiotic per her report. His is been getting worse, it is severe, she is getting lightheaded and short of breath. Paramedics report that she was mildly hypertensive in route but no hypoxia, hypotension. The patient denies nausea vomiting back pain swelling rashes headaches blurred vision or sore throat.  Patient is a 51 y.o. female presenting with hematochezia. The history is provided by the patient and the EMS personnel.  Rectal Bleeding     History reviewed. No pertinent past medical history.  History reviewed. No pertinent past surgical history.  No family history on file.  History  Substance Use Topics  . Smoking status: Never Smoker   . Smokeless tobacco: Not on file  . Alcohol Use: No    OB History    Grav Para Term Preterm Abortions TAB SAB Ect Mult Living                  Review of Systems  Gastrointestinal: Positive for hematochezia.  All other systems reviewed and are negative.    Allergies  Review of patient's allergies indicates no known allergies.  Home Medications   Current Outpatient Rx  Name Route Sig Dispense Refill  . HYDROCHLOROTHIAZIDE  25 MG PO TABS Oral Take 25 mg by mouth daily.    . IBUPROFEN 600 MG PO TABS Oral Take 600 mg by mouth 2 (two) times daily.    Marland Kitchen METRONIDAZOLE 500 MG PO TABS Oral Take 500 mg by mouth 3 (three) times daily. Started 12-31-11  for 7 days. Ending 01-07-12.    Marland Kitchen ONDANSETRON HCL 4 MG PO TABS Oral Take 4 mg by mouth every 8 (eight) hours as needed. For nausea    . OXYCODONE HCL PO Oral Take 1 tablet by mouth 2 (two) times daily.      BP 128/83  Pulse 98  Temp 97.4 F (36.3 C) (Oral)  Resp 14  SpO2 97%  Physical Exam  Nursing note and vitals reviewed. Constitutional: She appears well-developed and well-nourished. No distress.  HENT:  Head: Normocephalic and atraumatic.  Mouth/Throat: Oropharynx is clear and moist. No oropharyngeal exudate.  Eyes: Conjunctivae and EOM are normal. Pupils are equal, round, and reactive to light. Right eye exhibits no discharge. Left eye exhibits no discharge. No scleral icterus.  Neck: Normal range of motion. Neck supple. No JVD present. No thyromegaly present.  Cardiovascular: Regular rhythm, normal heart sounds and intact distal pulses.  Exam reveals no gallop and no friction rub.   No murmur heard.      Mild tachycardia, strong peripheral pulses at the radial arteries bilaterally  Pulmonary/Chest: Effort normal and breath sounds normal. No respiratory distress. She has no wheezes.  She has no rales.  Abdominal: Soft. Bowel sounds are normal. She exhibits no distension and no mass. There is tenderness ( Focal tenderness to palpation in the left lower quadrant and periumbilical region, no guarding, no peritoneal signs).  Genitourinary:       Rectal exam with chaperone present, gross blood in the rectal vault, both liquid and clot, no palpated hemorrhoids or fissures, no masses palpated  Musculoskeletal: Normal range of motion. She exhibits no edema and no tenderness.  Lymphadenopathy:    She has no cervical adenopathy.  Neurological: She is alert. Coordination  normal.  Skin: Skin is warm and dry. No rash noted. No erythema.  Psychiatric: She has a normal mood and affect. Her behavior is normal.    ED Course  Procedures (including critical care time)  Labs Reviewed  CBC WITH DIFFERENTIAL - Abnormal; Notable for the following:    WBC 13.4 (*)     RBC 3.26 (*)     Hemoglobin 11.5 (*)     HCT 32.9 (*)     MCV 100.9 (*)     MCH 35.3 (*)     Neutro Abs 9.7 (*)     All other components within normal limits  COMPREHENSIVE METABOLIC PANEL - Abnormal; Notable for the following:    Potassium 1.8 (*)     Calcium 8.3 (*)     Total Protein 5.4 (*)     Albumin 2.9 (*)     Total Bilirubin 0.1 (*)     GFR calc non Af Amer 79 (*)     All other components within normal limits  RETICULOCYTES - Abnormal; Notable for the following:    RBC. 3.26 (*)     All other components within normal limits  TYPE AND SCREEN  APTT  PROTIME-INR  ABO/RH  VITAMIN B12  FOLATE  IRON AND TIBC  FERRITIN   No results found.   1. GI bleed   2. Hypokalemia       MDM  The patient appears ill with gross rectal bleeding which she appears voluminous, likely anemic and coagulopathic secondary to platelet dysfunction from excessive aspirin intake. Will type and screen, collect anemia labs, provided GI medications including Pepcid and Protonix including a drip though I suspect this is likely lower GI in the appearance of the blood being bright red. There is not appear to be a definite source in the rectal vault on anoscopy, will involve gastroenterology as well as internal medicine.  Procedure Note:  Anoscopy  Risks benefits alternatives of the procedure given to the patient Verbal Consent obtained Patient placed in the lateral decubitus position Anoscopy performed Findings  gross bright red blood with extensive amount of clot in the rectal vault Patient tolerated procedure without any complaints  D/w Dr. Dulce Sellar who will see in the AM.   D/w Dr. Mikeal Hawthorne who will  admit  Has very low K requiring Magnesium Sulfate and K drips as well as protonix drip.  Outside records reviewed - showed mild colitis of the descending colon and Hgb of 16.5, today is 11.5 with significant drop in Hgb  Lab has verified that the potassium is 1.8, EKG shows prolonged QT  ED ECG REPORT  I personally interpreted this EKG   Date: 01/07/2012   Rate: 91  Rhythm: normal sinus rhythm  QRS Axis: normal  Intervals: QT prolonged  ST/T Wave abnormalities: nonspecific ST/T changes  Conduction Disutrbances:nonspecific intraventricular conduction delay  Narrative Interpretation:   Old EKG Reviewed: Compared with 05/24/2010,  QT interval prolonged, QRS no widened.   Critical care provided.    CRITICAL CARE Performed by: Vida Roller   Total critical care time: 35  Critical care time was exclusive of separately billable procedures and treating other patients.  Critical care was necessary to treat or prevent imminent or life-threatening deterioration.  Critical care was time spent personally by me on the following activities: development of treatment plan with patient and/or surrogate as well as nursing, discussions with consultants, evaluation of patient's response to treatment, examination of patient, obtaining history from patient or surrogate, ordering and performing treatments and interventions, ordering and review of laboratory studies, ordering and review of radiographic studies, pulse oximetry and re-evaluation of patient's condition.         Vida Roller, MD 01/07/12 7846  Vida Roller, MD 01/07/12 [phone]

## 2012-01-06 NOTE — ED Notes (Signed)
PHARMACY NOTIFIED ON PT'S PROTONIX IV ORDER.

## 2012-01-06 NOTE — ED Notes (Signed)
PT. ARRIVED WITH EMS FROM HOME , REPORTS INTERMITTENT RECTAL BLEEDING FOR 2 WEEKS AND THEN THIS EVENING , VOMITTED X1 PTA - RECEIVED ZOFRAN 4 MG IV BY EMS .

## 2012-01-07 ENCOUNTER — Encounter (HOSPITAL_COMMUNITY)
Admission: EM | Disposition: A | Discharge status: Home / Self Care | Payer: Self-pay | Source: Home / Self Care | Attending: Internal Medicine

## 2012-01-07 ENCOUNTER — Inpatient Hospital Stay (HOSPITAL_COMMUNITY): Payer: Self-pay

## 2012-01-07 ENCOUNTER — Encounter (HOSPITAL_COMMUNITY): Payer: Self-pay | Admitting: Internal Medicine

## 2012-01-07 DIAGNOSIS — D649 Anemia, unspecified: Secondary | ICD-10-CM

## 2012-01-07 DIAGNOSIS — R9431 Abnormal electrocardiogram [ECG] [EKG]: Secondary | ICD-10-CM

## 2012-01-07 DIAGNOSIS — K922 Gastrointestinal hemorrhage, unspecified: Secondary | ICD-10-CM

## 2012-01-07 DIAGNOSIS — F1010 Alcohol abuse, uncomplicated: Secondary | ICD-10-CM | POA: Diagnosis present

## 2012-01-07 DIAGNOSIS — E876 Hypokalemia: Secondary | ICD-10-CM | POA: Diagnosis present

## 2012-01-07 DIAGNOSIS — D62 Acute posthemorrhagic anemia: Secondary | ICD-10-CM | POA: Diagnosis present

## 2012-01-07 DIAGNOSIS — D72829 Elevated white blood cell count, unspecified: Secondary | ICD-10-CM | POA: Diagnosis present

## 2012-01-07 HISTORY — PX: ESOPHAGOGASTRODUODENOSCOPY: SHX5428

## 2012-01-07 HISTORY — DX: Gastrointestinal hemorrhage, unspecified: K92.2

## 2012-01-07 LAB — CBC
HCT: 26.9 % — ABNORMAL LOW (ref 36.0–46.0)
HCT: 24 % — ABNORMAL LOW (ref 36.0–46.0)
HCT: 24.2 % — ABNORMAL LOW (ref 36.0–46.0)
Hemoglobin: 8.8 g/dL — ABNORMAL LOW (ref 12.0–15.0)
Hemoglobin: 8.4 g/dL — ABNORMAL LOW (ref 12.0–15.0)
Hemoglobin: 8.3 g/dL — ABNORMAL LOW (ref 12.0–15.0)
MCH: 35.5 pg — ABNORMAL HIGH (ref 26.0–34.0)
MCH: 35.8 pg — ABNORMAL HIGH (ref 26.0–34.0)
MCHC: 34.3 g/dL (ref 30.0–36.0)
MCV: 102.6 fL — ABNORMAL HIGH (ref 78.0–100.0)
MCV: 104.3 fL — ABNORMAL HIGH (ref 78.0–100.0)
RBC: 2.48 MIL/uL — ABNORMAL LOW (ref 3.87–5.11)
RBC: 2.34 MIL/uL — ABNORMAL LOW (ref 3.87–5.11)
RDW: 12.4 % (ref 11.5–15.5)
RDW: 12.7 % (ref 11.5–15.5)
WBC: 10.6 10*3/uL — ABNORMAL HIGH (ref 4.0–10.5)
WBC: 8.1 10*3/uL (ref 4.0–10.5)

## 2012-01-07 LAB — VITAMIN B12: Vitamin B-12: 775 pg/mL (ref 211–911)

## 2012-01-07 LAB — CBC WITH DIFFERENTIAL/PLATELET
Basophils Relative: 1 % (ref 0–1)
HCT: 32.9 % — ABNORMAL LOW (ref 36.0–46.0)
Hemoglobin: 11.5 g/dL — ABNORMAL LOW (ref 12.0–15.0)
MCH: 35.3 pg — ABNORMAL HIGH (ref 26.0–34.0)
MCHC: 35 g/dL (ref 30.0–36.0)
Monocytes Absolute: 1 10*3/uL (ref 0.1–1.0)
Monocytes Relative: 7 % (ref 3–12)
Neutro Abs: 9.7 10*3/uL — ABNORMAL HIGH (ref 1.7–7.7)

## 2012-01-07 LAB — COMPREHENSIVE METABOLIC PANEL
Albumin: 2.9 g/dL — ABNORMAL LOW (ref 3.5–5.2)
Alkaline Phosphatase: 49 U/L (ref 39–117)
BUN: 11 mg/dL (ref 6–23)
BUN: 9 mg/dL (ref 6–23)
Calcium: 7.5 mg/dL — ABNORMAL LOW (ref 8.4–10.5)
Chloride: 102 mEq/L (ref 96–112)
Creatinine, Ser: 0.85 mg/dL (ref 0.50–1.10)
GFR calc Af Amer: >90 mL/min (ref >90)
GFR calc Af Amer: >90 mL/min (ref >90)
GFR calc non Af Amer: 79 mL/min — ABNORMAL LOW (ref >90)
Glucose, Bld: 95 mg/dL (ref 70–99)
Glucose, Bld: 122 mg/dL — ABNORMAL HIGH (ref 70–99)
Potassium: 2.7 mEq/L — CL (ref 3.5–5.1)
Total Bilirubin: 0.1 mg/dL — ABNORMAL LOW (ref 0.3–1.2)
Total Protein: 4.8 g/dL — ABNORMAL LOW (ref 6.0–8.3)

## 2012-01-07 LAB — BASIC METABOLIC PANEL
BUN: 5 mg/dL — ABNORMAL LOW (ref 6–23)
Chloride: 115 mEq/L — ABNORMAL HIGH (ref 96–112)
Glucose, Bld: 161 mg/dL — ABNORMAL HIGH (ref 70–99)
Potassium: 4 mEq/L (ref 3.5–5.1)

## 2012-01-07 LAB — IRON AND TIBC
Iron: 58 ug/dL (ref 42–135)
Saturation Ratios: 36 % (ref 20–55)
TIBC: 163 ug/dL — ABNORMAL LOW (ref 250–470)
UIBC: 105 ug/dL — ABNORMAL LOW (ref 125–400)

## 2012-01-07 LAB — MAGNESIUM: Magnesium: 1.9 mg/dL (ref 1.5–2.5)

## 2012-01-07 LAB — ABO/RH: ABO/RH(D): O POS

## 2012-01-07 LAB — FOLATE: Folate: >20 ng/mL

## 2012-01-07 SURGERY — EGD (ESOPHAGOGASTRODUODENOSCOPY)
Anesthesia: Moderate Sedation

## 2012-01-07 MED ORDER — AMPICILLIN-SULBACTAM SODIUM 3 (2-1) G IJ SOLR
3.0000 g | Freq: Four times a day (QID) | INTRAMUSCULAR | Status: DC
  Administered 2012-01-07 – 2012-01-09 (×7): 3 g via INTRAVENOUS
  Filled 2012-01-07 (×11): qty 3

## 2012-01-07 MED ORDER — MIDAZOLAM HCL 10 MG/2ML IJ SOLN
INTRAMUSCULAR | Status: DC | PRN
  Administered 2012-01-07 (×3): 2 mg via INTRAVENOUS

## 2012-01-07 MED ORDER — POTASSIUM CHLORIDE 10 MEQ/100ML IV SOLN
10.0000 meq | Freq: Once | INTRAVENOUS | Status: AC
  Administered 2012-01-07: 10 meq via INTRAVENOUS
  Filled 2012-01-07: qty 100

## 2012-01-07 MED ORDER — BUTAMBEN-TETRACAINE-BENZOCAINE 2-2-14 % EX AERO
INHALATION_SPRAY | CUTANEOUS | Status: DC | PRN
  Administered 2012-01-07: 1 via TOPICAL

## 2012-01-07 MED ORDER — ACETAMINOPHEN 325 MG PO TABS: 650.0000 mg | ORAL_TABLET | Freq: Four times a day (QID) | ORAL | Status: DC | PRN

## 2012-01-07 MED ORDER — POTASSIUM CHLORIDE 10 MEQ/100ML IV SOLN
10.0000 meq | INTRAVENOUS | Status: AC
  Administered 2012-01-07 (×6): 10 meq via INTRAVENOUS
  Filled 2012-01-07 (×2): qty 100
  Filled 2012-01-07: qty 200
  Filled 2012-01-07 (×2): qty 100

## 2012-01-07 MED ORDER — KCL IN DEXTROSE-NACL 40-5-0.9 MEQ/L-%-% IV SOLN
INTRAVENOUS | Status: DC
  Administered 2012-01-07 – 2012-01-08 (×3): via INTRAVENOUS
  Filled 2012-01-07 (×9): qty 1000

## 2012-01-07 MED ORDER — MORPHINE SULFATE 2 MG/ML IJ SOLN
1.0000 mg | INTRAMUSCULAR | Status: DC | PRN
  Administered 2012-01-07: 21:00:00 via INTRAVENOUS
  Administered 2012-01-08: 1 mg via INTRAVENOUS
  Filled 2012-01-07 (×2): qty 1

## 2012-01-07 MED ORDER — PANTOPRAZOLE SODIUM 40 MG IV SOLR
40.0000 mg | Freq: Two times a day (BID) | INTRAVENOUS | Status: DC
  Administered 2012-01-07 – 2012-01-08 (×4): 40 mg via INTRAVENOUS
  Filled 2012-01-07 (×6): qty 40

## 2012-01-07 MED ORDER — ACETAMINOPHEN 650 MG RE SUPP: 650.0000 mg | RECTAL | Status: DC | PRN

## 2012-01-07 MED ORDER — ACETAMINOPHEN 650 MG RE SUPP: 650.0000 mg | Freq: Four times a day (QID) | RECTAL | Status: DC | PRN

## 2012-01-07 MED ORDER — SODIUM CHLORIDE 0.9 % IJ SOLN
3.0000 mL | Freq: Two times a day (BID) | INTRAMUSCULAR | Status: DC
  Administered 2012-01-07 – 2012-01-08 (×3): 3 mL via INTRAVENOUS

## 2012-01-07 MED ORDER — ONDANSETRON HCL 4 MG/2ML IJ SOLN
4.0000 mg | Freq: Once | INTRAMUSCULAR | Status: AC
  Administered 2012-01-07: 4 mg via INTRAVENOUS
  Filled 2012-01-07: qty 2

## 2012-01-07 MED ORDER — ONDANSETRON HCL 4 MG/2ML IJ SOLN
4.0000 mg | Freq: Four times a day (QID) | INTRAMUSCULAR | Status: DC | PRN
  Administered 2012-01-08: 4 mg via INTRAVENOUS
  Filled 2012-01-07: qty 2

## 2012-01-07 MED ORDER — DIPHENHYDRAMINE HCL 50 MG/ML IJ SOLN
INTRAMUSCULAR | Status: DC | PRN
  Administered 2012-01-07: 25 mg via INTRAVENOUS

## 2012-01-07 MED ORDER — FENTANYL CITRATE 0.05 MG/ML IJ SOLN
INTRAMUSCULAR | Status: AC
  Filled 2012-01-07: qty 2

## 2012-01-07 MED ORDER — DIPHENHYDRAMINE HCL 50 MG/ML IJ SOLN
INTRAMUSCULAR | Status: AC
  Filled 2012-01-07: qty 1

## 2012-01-07 MED ORDER — FENTANYL CITRATE 0.05 MG/ML IJ SOLN
INTRAMUSCULAR | Status: DC | PRN
  Administered 2012-01-07 (×3): 25 ug via INTRAVENOUS

## 2012-01-07 MED ORDER — ACETAMINOPHEN 325 MG PO TABS
650.0000 mg | ORAL_TABLET | ORAL | Status: DC | PRN
  Administered 2012-01-07 – 2012-01-08 (×2): 650 mg via ORAL
  Filled 2012-01-07 (×2): qty 2

## 2012-01-07 MED ORDER — MAGNESIUM SULFATE 40 MG/ML IJ SOLN
2.0000 g | Freq: Once | INTRAMUSCULAR | Status: AC
  Administered 2012-01-07: 2 g via INTRAVENOUS
  Filled 2012-01-07: qty 50

## 2012-01-07 MED ORDER — ONDANSETRON HCL 4 MG PO TABS: 4.0000 mg | ORAL_TABLET | Freq: Four times a day (QID) | ORAL | Status: DC | PRN

## 2012-01-07 MED ORDER — SODIUM CHLORIDE 0.9 % IV BOLUS (SEPSIS)
500.0000 mL | Freq: Once | INTRAVENOUS | Status: AC
  Administered 2012-01-07: 500 mL via INTRAVENOUS

## 2012-01-07 MED ORDER — MIDAZOLAM HCL 10 MG/2ML IJ SOLN
INTRAMUSCULAR | Status: AC
  Filled 2012-01-07: qty 2

## 2012-01-07 NOTE — H&P (Signed)
BURNETT LIEBER is an 51 y.o. female.   Chief Complaint: Rectal bleeding HPI: A 51 year old female presenting with rectal bleeding for 2 weeks. Patient apparently started having mild abdominal pain and is followed by massive rectal bleed which is getting worse in the last 2 days. She was seen at an outside hospital initially over the last 2 weeks. She was doing fine with a hemoglobin of 16 g. That has dropped to 11 g tonight. Patient also felt weak tired and unable to function properly so she can emergency room. In the ER she was found to have grossly bloody stools leukocytosis and profound hypokalemia with potassium of 1.8. She also had of his EKG changes consistent with severe hypokalemia. She has been taking Goody's powder for the past 15 years. Initially she takes one twice a day but lately she's been taking just once a day.  History reviewed. No pertinent past medical history.  History reviewed. No pertinent past surgical history.  History reviewed. No pertinent family history. Social History:  reports that she has never smoked. She does not have any smokeless tobacco history on file. She reports that she does not drink alcohol or use illicit drugs.  Allergies: No Known Allergies   (Not in a hospital admission)  Results for orders placed during the hospital encounter of 01/06/12 (from the past 48 hour(s))  CBC WITH DIFFERENTIAL     Status: Abnormal   Collection Time   01/06/12 11:24 PM      Component Value Range Comment   WBC 13.4 (*) 4.0 - 10.5 K/uL    RBC 3.26 (*) 3.87 - 5.11 MIL/uL    Hemoglobin 11.5 (*) 12.0 - 15.0 g/dL    HCT 30.8 (*) 65.7 - 46.0 %    MCV 100.9 (*) 78.0 - 100.0 fL    MCH 35.3 (*) 26.0 - 34.0 pg    MCHC 35.0  30.0 - 36.0 g/dL    RDW 84.6  96.2 - 95.2 %    Platelets 271  150 - 400 K/uL    Neutrophils Relative 72  43 - 77 %    Neutro Abs 9.7 (*) 1.7 - 7.7 K/uL    Lymphocytes Relative 17  12 - 46 %    Lymphs Abs 2.3  0.7 - 4.0 K/uL    Monocytes Relative 7  3 -  12 %    Monocytes Absolute 1.0  0.1 - 1.0 K/uL    Eosinophils Relative 3  0 - 5 %    Eosinophils Absolute 0.4  0.0 - 0.7 K/uL    Basophils Relative 1  0 - 1 %    Basophils Absolute 0.1  0.0 - 0.1 K/uL   COMPREHENSIVE METABOLIC PANEL     Status: Abnormal   Collection Time   01/06/12 11:24 PM      Component Value Range Comment   Sodium 139  135 - 145 mEq/L    Potassium 1.8 (*) 3.5 - 5.1 mEq/L    Chloride 102  96 - 112 mEq/L    CO2 23  19 - 32 mEq/L    Glucose, Bld 95  70 - 99 mg/dL    BUN 11  6 - 23 mg/dL    Creatinine, Ser 8.41  0.50 - 1.10 mg/dL    Calcium 8.3 (*) 8.4 - 10.5 mg/dL    Total Protein 5.4 (*) 6.0 - 8.3 g/dL    Albumin 2.9 (*) 3.5 - 5.2 g/dL    AST 25  0 - 37  U/L    ALT 17  0 - 35 U/L    Alkaline Phosphatase 55  39 - 117 U/L    Total Bilirubin 0.1 (*) 0.3 - 1.2 mg/dL    GFR calc non Af Amer 79 (*) >90 mL/min    GFR calc Af Amer >90  >90 mL/min   APTT     Status: Normal   Collection Time   01/06/12 11:24 PM      Component Value Range Comment   aPTT 36  24 - 37 seconds   PROTIME-INR     Status: Normal   Collection Time   01/06/12 11:24 PM      Component Value Range Comment   Prothrombin Time 14.3  11.6 - 15.2 seconds    INR 1.09  0.00 - 1.49   RETICULOCYTES     Status: Abnormal   Collection Time   01/06/12 11:24 PM      Component Value Range Comment   Retic Ct Pct 1.9  0.4 - 3.1 %    RBC. 3.26 (*) 3.87 - 5.11 MIL/uL    Retic Count, Manual 61.9  19.0 - 186.0 K/uL   TYPE AND SCREEN     Status: Normal   Collection Time   01/06/12 11:31 PM      Component Value Range Comment   ABO/RH(D) O POS      Antibody Screen NEG      Sample Expiration 01/09/2012     ABO/RH     Status: Normal   Collection Time   01/06/12 11:31 PM      Component Value Range Comment   ABO/RH(D) O POS      No results found.  Review of Systems  HENT: Negative.   Eyes: Negative.   Respiratory: Negative.   Cardiovascular: Negative.   Gastrointestinal: Positive for nausea, vomiting, diarrhea and  blood in stool.  Genitourinary: Negative.   Musculoskeletal: Negative.   Skin: Negative.   Neurological: Positive for weakness.  Endo/Heme/Allergies: Bruises/bleeds easily.  Psychiatric/Behavioral: Negative.     Blood pressure 128/83, pulse 98, temperature 97.4 F (36.3 C), temperature source Oral, resp. rate 14, SpO2 97.00%. Physical Exam  Constitutional: She is oriented to person, place, and time. She appears well-developed and well-nourished.       Ill-looking  HENT:  Head: Normocephalic and atraumatic.  Right Ear: External ear normal.  Left Ear: External ear normal.  Nose: Nose normal.  Mouth/Throat: Oropharynx is clear and moist.  Eyes: Conjunctivae and EOM are normal. Pupils are equal, round, and reactive to light.  Neck: Normal range of motion. Neck supple.  Cardiovascular: Regular rhythm, normal heart sounds and intact distal pulses.  Tachycardia present.   Respiratory: Effort normal and breath sounds normal.  GI: Soft. Bowel sounds are normal.  Musculoskeletal: Normal range of motion.  Neurological: She is alert and oriented to person, place, and time. She has normal reflexes.  Skin: Skin is warm and dry.  Psychiatric: She has a normal mood and affect. Her behavior is normal. Judgment and thought content normal.     Assessment/Plan 51 year old female here with lower GI bleed profound hypokalemia as well as leukocytosis. See she had an outside hospital CT scan that is consistent with diverticular disease with some colitis about 2 weeks ago. I recommended will therefore be a combination of diverticular bleed, colitis, or internal hemorrhoids.  Plan #1 lower GI bleed: Patient will be admitted to step down unit. Will check serial CBCs every 6 hours. Type and crossmatch for  2 units of packed red blood cells on hold. We'll transfuse her if her hemoglobin drops below 8 or if she becomes hemodynamically unstable. GI has been consulted for possible scope in the morning. We'll put  her on IV Protonix at this point. No Goody's powder.  #2 profound hypokalemia: Possibly from taking diuretics as well as persistent diarrhea in the last 2 weeks.  #3 anemia blood loss: Again we'll follow H&H every 6 hours. We will transfuse her if she becomes hemodynamically unstable or if the hemoglobin drops to less than 8 g.  #4 leukocytosis: Probably reactive. Follow patient closely as well as her white count. Checking a chest x-ray and urinalysis.  #5 hypertension: We will hold antihypertensives in the setting of GI bleed. Blood pressure seems reasonable.  GARBA,LAWAL 01/07/2012, 1:08 AM

## 2012-01-07 NOTE — Progress Notes (Signed)
Utilization Review Completed.  [name]  01/07/2012  

## 2012-01-07 NOTE — ED Notes (Signed)
REPORT GIVEN TO FLOOR NURSE , PT. CHANGED THE 3RD TIME WITH BLOODY DISCHARGE FOR RECTUM , DENIES PAIN , ALERT AND ORIENTED , IV SITES UNREMARKABLE.

## 2012-01-07 NOTE — Interval H&P Note (Signed)
History and Physical Interval Note:  01/07/2012 4:23 PM  Sheppard Plumber  has presented today for surgery, with the diagnosis of GI bleed  The various methods of treatment have been discussed with the patient and family. After consideration of risks, benefits and other options for treatment, the patient has consented to  Procedure(s) (LRB): ESOPHAGOGASTRODUODENOSCOPY (EGD) (N/A) as a surgical intervention .  The patient's history has been reviewed, patient examined, no change in status, stable for surgery.  I have reviewed the patient's chart and labs.  Questions were answered to the patient's satisfaction.     Yasser Hepp JR,Dae Antonucci L

## 2012-01-07 NOTE — Op Note (Signed)
[facility] [address] [place], [place]  [address]  ENDOSCOPY PROCEDURE REPORT  PATIENT:  Maria, Frederick  MR#:  [id number] BIRTHDATE:  [birth year], 51 yrs. old  GENDER:  female  ENDOSCOPIST:  Carman Ching Referred by:  [facility],  PROCEDURE DATE:  01/07/2012 PROCEDURE:  EGD, diagnostic 43235 ASA CLASS:  Class II INDICATIONS:  melenic bleeding patient had an acute drop in hemoglobin. She has a history of BC use. For this reason EGD performed.  MEDICATIONS:   Benadryl 25 mg IV, Fentanyl 75 mcg IV, Versed 6 mg IV TOPICAL ANESTHETIC:  Cetacaine Spray  DESCRIPTION OF PROCEDURE:   After the risks and benefits of the procedure were explained, informed consent was obtained.  The EG-[id number] ([id number]) endoscope was introduced through the mouth and advanced to the second portion of the duodenum.  The instrument was slowly withdrawn as the mucosa was fully examined. <<PROCEDUREIMAGES>>  An ulcer was found in the antrum. the ulcer was approximately 1/2 cm or less. There was no stigmata of recent bleeding. There was associated gastritis and erosions alter the antrum.  nl esophagus in the total esophagus. no signs of esophageal varices. Small hiatal hernia  the duodenal bulb and second duodenum were without abnormalities.    Retroflexed views revealed no abnormalities. The scope was then withdrawn from the patient and the procedure completed.  COMPLICATIONS:  A complication of none occurred on 01/07/2012 at.  ENDOSCOPIC IMPRESSION: 1) Nl esophagus in the total esophagus 2) Nl duodenum 3) Antral gastric ulcer this is a very small ulcer is associated with gastritis is probably related to nonsteroidal use. It is not at all clear that this is the cause of her acute drop in hemoglobin. RECOMMENDATIONS: 1) continue PPI would also is that this could be a diverticular bleed. We'll add MiraLAX and try to get her cleaned out and follow  clinically.  ______________________________ Carman Ching  CC:  n. eSIGNEDCarman Ching at 01/07/2012 04:53 PM  Ramon Dredge, [phone]

## 2012-01-07 NOTE — Progress Notes (Signed)
[facility] PROGRESS NOTE  [name] ZOX:[id number] DOB: 12/10/1960 DOA: 01/06/2012 PCP: Crawford Givens, MD  Assessment/Plan: Principal Problem:  *GIB  Possibly lower GI but due to her h/o ETOH abuse and goody powder use, she will have an EGD today.  She continues to bleed profusely and is becoming lightheaded. Possible h/o of colitis and "kidney stones"- she was placed on and antibiotic at [place] and 2 others at [facility] but was vomiting too much to tolerate them. She did not vomit blood.  Will start Unasyn now - QT prolonged therefore will avoid flouroquinolones.   Active Problems:  Anemia due to blood loss, acute Will need to transfuse 2 units PRBC now- she has had 2-3 more bloody stools since the Hgb was checked this AM. Will place on strict bedrest.    HYPERTENSION/ hypotension Fluid bolus - 500 cc ordered to be given prior to blood transfusion.    Hypokalemia Replaced, recheck K+ and Mg+ now.    Leukocytosis Improved- were likely reactive.    Alcohol abuse Follow on CIWA scale.    Code Status: Full Family Communication: with family in room Disposition Plan: keep in SDU   Brief narrative: Patient has had a history of recent bleeding. She was in the emergency room in [place] following rectal bleeding several days ago and probably had a CT scan. Her family notes that she has had some rectal bleeding for about 2 weeks and somewhere along the way had a kidney stone. He reports that she was in the emergency room in  her hemoglobin was 16. She continued to feel weak and passing dark tarry stools and some maroonish stools and was subsequently brought to the emergency room,. At this point, she was found to have a potassium of 1.8 and a hemoglobin of 11. Since her hospitalization, her hemoglobin has dropped from 11.5-8.8 and she has had some maroonish stools. Other labs are remarkable for normal bilirubin AST and ALT with a somewhat low albumin of  2.9. Her INR is 1.09. The patient has a history of drinking he drinks at least 3 shots or more of whiskey a day. She also takes daily BC powders for chronic headaches. She denies ever having EGD . She apparently vomited yesterday her fianc indicates this was food in clear material with no signs of blood.   Consultants:  GI- Dr Randa Evens   HPI/Subjective: Feels weak and a little lightheaded when getting up. Having abdominal cramping  Objective: Filed Vitals:   01/07/12 0235 01/07/12 0422 01/07/12 0800 01/07/12 1151  BP:  117/59 118/70 117/64  Pulse:  90 91 85  Temp: 97.7 F (36.5 C) 97.5 F (36.4 C) 98.1 F (36.7 C) 98.4 F (36.9 C)  TempSrc: Oral Oral Oral Oral  Resp:  19 20 18   Height: 4\' 10"  (1.473 m)     Weight: 48.8 kg (107 lb 9.4 oz)     SpO2:  99% 99% 97%    Intake/Output Summary (Last 24 hours) at 01/07/12 1532 Last data filed at 01/07/12 1430  Gross per 24 hour  Intake 2511.75 ml  Output    975 ml  Net 1536.75 ml    Exam:   General:  Weak and pale appearing, mild distress.   Cardiovascular: RRR, tachycardic  Respiratory: CTA b/l  Abdomen: soft, tender in left abdomen and suprapubic area, BS+  Ext: no c/c/e  Data Reviewed: Basic Metabolic Panel:  Lab 01/07/12 8119 01/06/12 2324  NA 140 139  K 2.7* 1.8*  CL 106 102  CO2 22 23  GLUCOSE 122* 95  BUN 9 11  CREATININE 0.71 0.85  CALCIUM 7.5* 8.3*  MG 2.3 --  PHOS -- --   Liver Function Tests:  Lab 01/07/12 0234 01/06/12 2324  AST 20 25  ALT 15 17  ALKPHOS 49 55  BILITOT 0.1* 0.1*  PROT 4.8* 5.4*  ALBUMIN 2.6* 2.9*   No results found for this basename: LIPASE:5,AMYLASE:5 in the last 168 hours No results found for this basename: AMMONIA:5 in the last 168 hours CBC:  Lab 01/07/12 1452 01/07/12 0809 01/07/12 0234 01/06/12 2324  WBC 8.1 7.8 10.6* 13.4*  NEUTROABS -- -- -- 9.7*  HGB 8.4* 8.8* 9.5* 11.5*  HCT 24.0* 25.1* 26.9* 32.9*  MCV 102.6* 101.2* 100.7* 100.9*  PLT 230 252 235 271    Cardiac Enzymes: No results found for this basename: CKTOTAL:5,CKMB:5,CKMBINDEX:5,TROPONINI:5 in the last 168 hours BNP (last 3 results) No results found for this basename: PROBNP:3 in the last 8760 hours CBG: No results found for this basename: GLUCAP:5 in the last 168 hours  Recent Results (from the past 240 hour(s))  MRSA PCR SCREENING     Status: Normal   Collection Time   01/07/12  2:29 AM      Component Value Range Status Comment   MRSA by PCR NEGATIVE  NEGATIVE Final      Studies: No results found.  Scheduled Meds:   . ampicillin-sulbactam (UNASYN) IV  3 g Intravenous Q6H  . magnesium sulfate  2 g Intravenous Once  . ondansetron      . ondansetron (ZOFRAN) IV  4 mg Intravenous Once  . pantoprozole (PROTONIX) infusion  8 mg/hr Intravenous To ER  . pantoprazole (PROTONIX) IV  40 mg Intravenous STAT  . pantoprazole (PROTONIX) IV  40 mg Intravenous Q12H  . potassium chloride  10 mEq Intravenous Once  . potassium chloride  10 mEq Intravenous Q1 Hr x 6  . sodium chloride  1,000 mL Intravenous Once  . sodium chloride  500 mL Intravenous Once  . sodium chloride  3 mL Intravenous Q12H   Continuous Infusions:   . dextrose 5 % and 0.9 % NaCl with KCl 40 mEq/L 125 mL/hr at 01/07/12 1117    ________________________________________________________________________  Time spent: 50 min    [facility]  [facility] Pager [phone] If 8PM-8AM, please contact night-coverage at [email or web address], password [facility] 01/07/2012, 3:32 PM  LOS: 1 day

## 2012-01-07 NOTE — Progress Notes (Addendum)
CRITICAL VALUE ALERT  Critical value received:  K+ 2.7  Date of notification:  01/07/12   Time of notification:  0338  Critical value read back:yes  Nurse who received alert:  Sumner Boast, RN  MD notified (1st page):  Bretta Bang, NP  Time of first page:  [phone]  MD notified (2nd page):  Time of second page:  Responding MD: Bretta Bang, NP  Time MD responded:  [phone]

## 2012-01-07 NOTE — Consult Note (Signed)
[facility] CONSULT Reason for consult: GI bleeding Referring Physician: [facility]. She does not have a primary care physician  [name] is an 51 y.o. female.  HPI: Patient has had a history of recent bleeding. She was in the emergency room in [place] following rectal bleeding several days ago and probably had a CT scan. Her family notes that she has had some rectal bleeding for about 2 weeks and somewhere along the way had a kidney stone. He reports that she was in the emergency room in [place] her hemoglobin was 16. She continued to feel weak and passing dark tarry stools and some maroonish stools and was subsequently brought to the emergency room,. At this point, she was found to have a potassium of 1.8 and a hemoglobin of 11. Since her hospitalization, her hemoglobin has dropped from 11.5-8.8 and she has had some maroonish stools. Other labs are remarkable for normal bilirubin AST and ALT with a somewhat low albumin of 2.9. Her INR is 1.09. The patient has a history of drinking he drinks at least 3 shots or more of whiskey a day. She also takes daily BC powders for chronic headaches. She denies ever having EGD . She apparently vomited yesterday her fianc indicates this was food in clear material with no signs of blood.  Past Medical History  Diagnosis Date  . Hypertension     History reviewed. No pertinent past surgical history.  History reviewed. No pertinent family history.  Social History:  reports that she has been smoking Cigarettes.  She has been smoking about .5 packs per day. She does not have any smokeless tobacco history on file. She reports that she drinks about 16.8 ounces of alcohol per week. She reports that she does not use illicit drugs.  Allergies: No Known Allergies  Medications;    . ampicillin-sulbactam (UNASYN) IV  3 g Intravenous Q6H  . magnesium sulfate  2 g Intravenous Once  . ondansetron      . ondansetron (ZOFRAN) IV  4 mg  Intravenous Once  . pantoprozole (PROTONIX) infusion  8 mg/hr Intravenous To ER  . pantoprazole (PROTONIX) IV  40 mg Intravenous STAT  . pantoprazole (PROTONIX) IV  40 mg Intravenous Q12H  . potassium chloride  10 mEq Intravenous Once  . potassium chloride  10 mEq Intravenous Q1 Hr x 6  . sodium chloride  1,000 mL Intravenous Once  . sodium chloride  500 mL Intravenous Once  . sodium chloride  3 mL Intravenous Q12H   PRN Meds acetaminophen, acetaminophen, morphine injection, ondansetron (ZOFRAN) IV, ondansetron Results for orders placed during the hospital encounter of 01/06/12 (from the past 48 hour(s))  CBC WITH DIFFERENTIAL     Status: Abnormal   Collection Time   01/06/12 11:24 PM      Component Value Range Comment   WBC 13.4 (*) 4.0 - 10.5 K/uL    RBC 3.26 (*) 3.87 - 5.11 MIL/uL    Hemoglobin 11.5 (*) 12.0 - 15.0 g/dL    HCT 95.2 (*) 84.1 - 46.0 %    MCV 100.9 (*) 78.0 - 100.0 fL    MCH 35.3 (*) 26.0 - 34.0 pg    MCHC 35.0  30.0 - 36.0 g/dL    RDW 32.4  40.1 - 02.7 %    Platelets 271  150 - 400 K/uL    Neutrophils Relative 72  43 - 77 %    Neutro Abs 9.7 (*) 1.7 - 7.7 K/uL    Lymphocytes  Relative 17  12 - 46 %    Lymphs Abs 2.3  0.7 - 4.0 K/uL    Monocytes Relative 7  3 - 12 %    Monocytes Absolute 1.0  0.1 - 1.0 K/uL    Eosinophils Relative 3  0 - 5 %    Eosinophils Absolute 0.4  0.0 - 0.7 K/uL    Basophils Relative 1  0 - 1 %    Basophils Absolute 0.1  0.0 - 0.1 K/uL   COMPREHENSIVE METABOLIC PANEL     Status: Abnormal   Collection Time   01/06/12 11:24 PM      Component Value Range Comment   Sodium 139  135 - 145 mEq/L    Potassium 1.8 (*) 3.5 - 5.1 mEq/L    Chloride 102  96 - 112 mEq/L    CO2 23  19 - 32 mEq/L    Glucose, Bld 95  70 - 99 mg/dL    BUN 11  6 - 23 mg/dL    Creatinine, Ser 2.95  0.50 - 1.10 mg/dL    Calcium 8.3 (*) 8.4 - 10.5 mg/dL    Total Protein 5.4 (*) 6.0 - 8.3 g/dL    Albumin 2.9 (*) 3.5 - 5.2 g/dL    AST 25  0 - 37 U/L    ALT 17  0 - 35 U/L     Alkaline Phosphatase 55  39 - 117 U/L    Total Bilirubin 0.1 (*) 0.3 - 1.2 mg/dL    GFR calc non Af Amer 79 (*) >90 mL/min    GFR calc Af Amer >90  >90 mL/min   APTT     Status: Normal   Collection Time   01/06/12 11:24 PM      Component Value Range Comment   aPTT 36  24 - 37 seconds   PROTIME-INR     Status: Normal   Collection Time   01/06/12 11:24 PM      Component Value Range Comment   Prothrombin Time 14.3  11.6 - 15.2 seconds    INR 1.09  0.00 - 1.49   VITAMIN B12     Status: Normal   Collection Time   01/06/12 11:24 PM      Component Value Range Comment   Vitamin B-12 775  211 - 911 pg/mL   FOLATE     Status: Normal   Collection Time   01/06/12 11:24 PM      Component Value Range Comment   Folate >20.0     IRON AND TIBC     Status: Abnormal   Collection Time   01/06/12 11:24 PM      Component Value Range Comment   Iron 58  42 - 135 ug/dL    TIBC 621 (*) 308 - 657 ug/dL    Saturation Ratios 36  20 - 55 %    UIBC 105 (*) 125 - 400 ug/dL   FERRITIN     Status: Normal   Collection Time   01/06/12 11:24 PM      Component Value Range Comment   Ferritin 106  10 - 291 ng/mL   RETICULOCYTES     Status: Abnormal   Collection Time   01/06/12 11:24 PM      Component Value Range Comment   Retic Ct Pct 1.9  0.4 - 3.1 %    RBC. 3.26 (*) 3.87 - 5.11 MIL/uL    Retic Count, Manual 61.9  19.0 - 186.0 K/uL  TYPE AND SCREEN     Status: Normal (Preliminary result)   Collection Time   01/06/12 11:31 PM      Component Value Range Comment   ABO/RH(D) O POS      Antibody Screen NEG      Sample Expiration 01/09/2012      Unit Number [id number]      Blood Component Type RED CELLS,LR      Unit division 00      Status of Unit ALLOCATED      Transfusion Status OK TO TRANSFUSE      Crossmatch Result Compatible      Unit Number [id number]      Blood Component Type RED CELLS,LR      Unit division 00      Status of Unit ALLOCATED      Transfusion Status OK TO TRANSFUSE      Crossmatch Result  Compatible     ABO/RH     Status: Normal   Collection Time   01/06/12 11:31 PM      Component Value Range Comment   ABO/RH(D) O POS     MRSA PCR SCREENING     Status: Normal   Collection Time   01/07/12  2:29 AM      Component Value Range Comment   MRSA by PCR NEGATIVE  NEGATIVE   MAGNESIUM     Status: Normal   Collection Time   01/07/12  2:34 AM      Component Value Range Comment   Magnesium 2.3  1.5 - 2.5 mg/dL   TSH     Status: Abnormal   Collection Time   01/07/12  2:34 AM      Component Value Range Comment   TSH 5.367 (*) 0.350 - 4.500 uIU/mL   COMPREHENSIVE METABOLIC PANEL     Status: Abnormal   Collection Time   01/07/12  2:34 AM      Component Value Range Comment   Sodium 140  135 - 145 mEq/L    Potassium 2.7 (*) 3.5 - 5.1 mEq/L    Chloride 106  96 - 112 mEq/L    CO2 22  19 - 32 mEq/L    Glucose, Bld 122 (*) 70 - 99 mg/dL    BUN 9  6 - 23 mg/dL    Creatinine, Ser 7.82  0.50 - 1.10 mg/dL    Calcium 7.5 (*) 8.4 - 10.5 mg/dL    Total Protein 4.8 (*) 6.0 - 8.3 g/dL    Albumin 2.6 (*) 3.5 - 5.2 g/dL    AST 20  0 - 37 U/L    ALT 15  0 - 35 U/L    Alkaline Phosphatase 49  39 - 117 U/L    Total Bilirubin 0.1 (*) 0.3 - 1.2 mg/dL    GFR calc non Af Amer >90  >90 mL/min    GFR calc Af Amer >90  >90 mL/min   CBC     Status: Abnormal   Collection Time   01/07/12  2:34 AM      Component Value Range Comment   WBC 10.6 (*) 4.0 - 10.5 K/uL    RBC 2.67 (*) 3.87 - 5.11 MIL/uL    Hemoglobin 9.5 (*) 12.0 - 15.0 g/dL DELTA CHECK NOTED   HCT 26.9 (*) 36.0 - 46.0 %    MCV 100.7 (*) 78.0 - 100.0 fL    MCH 35.6 (*) 26.0 - 34.0 pg    MCHC 35.3  30.0 - 36.0  g/dL    RDW 40.9  81.1 - 91.4 %    Platelets 235  150 - 400 K/uL   CBC     Status: Abnormal   Collection Time   01/07/12  8:09 AM      Component Value Range Comment   WBC 7.8  4.0 - 10.5 K/uL    RBC 2.48 (*) 3.87 - 5.11 MIL/uL    Hemoglobin 8.8 (*) 12.0 - 15.0 g/dL    HCT 78.2 (*) 95.6 - 46.0 %    MCV 101.2 (*) 78.0 - 100.0 fL    MCH  35.5 (*) 26.0 - 34.0 pg    MCHC 35.1  30.0 - 36.0 g/dL    RDW 21.3  08.6 - 57.8 %    Platelets 252  150 - 400 K/uL   PREPARE RBC (CROSSMATCH)     Status: Normal   Collection Time   01/07/12  2:24 PM      Component Value Range Comment   Order Confirmation ORDER PROCESSED BY BLOOD BANK       No results found. ROS: Primarily as noted above in history of present illness Constitutional: HEENT: Cardiovascular: Respiratory: GI: GU: Musculoskeletal: Neuro/Psychiatric: Endocrine/Heme:            Blood pressure 117/64, pulse 85, temperature 98.4 F (36.9 C), temperature source Oral, resp. rate 18, height 4\' 10"  (1.473 m), weight 48.8 kg (107 lb 9.4 oz), SpO2 97.00%.  Physical exam:   General-pale white female in no acute distress Eyes-sclerae are nonicteric Lungs-clear Heart-regular rate and rhythm without murmurs or gallops Abdomen-mild upper abdominal tenderness really more periumbilical with elevated bowel sounds.  Assessment: #1 GI bleed. Could be lower GI workup for GI. Her BUN and creatinine are normal. 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 Think first thing to do is to go ahead with an EGD.  Plan: We'll proceed at this time with EGD. Have discussed with the patient and her family the rationale for this. We've discussed the fact we may need to perform intervention if we find active bleeding and they understand this. If her EGD is negative, she will be treated as a lower GI bleed.   Lucylle Foulkes JR,Cai Anfinson L 01/07/2012, 2:53 PM

## 2012-01-07 NOTE — Progress Notes (Signed)
Nutrition Brief Note  Patient identified on the Nutrition Risk Report for Problems Chewing/Swallowing. Pt denies that that she has these issues. Reports that her weight is stable and intake prior to admission has been appropriate.  Body mass index is 22.49 kg/(m^2). Pt meets criteria for normal weight based on current BMI.   Current diet order is Clear Liquids, patient is consuming approximately 75% of meals at this time. Pt reports that she is tolerating clear liquids well at this time. Labs and medications reviewed.   No nutrition interventions warranted at this time. If nutrition issues arise, please re-consult RD.   Jarold Motto MS, RD, LDN Pager: [phone] After-hours pager: [phone]

## 2012-01-08 DIAGNOSIS — F1010 Alcohol abuse, uncomplicated: Secondary | ICD-10-CM

## 2012-01-08 DIAGNOSIS — D62 Acute posthemorrhagic anemia: Secondary | ICD-10-CM

## 2012-01-08 LAB — URINALYSIS, ROUTINE W REFLEX MICROSCOPIC
Glucose, UA: NEGATIVE mg/dL
Leukocytes, UA: NEGATIVE
Protein, ur: NEGATIVE mg/dL
Specific Gravity, Urine: 1.014 (ref 1.005–1.030)
Urobilinogen, UA: 0.2 mg/dL (ref 0.0–1.0)

## 2012-01-08 LAB — CBC
HCT: 32.9 % — ABNORMAL LOW (ref 36.0–46.0)
Hemoglobin: 9.3 g/dL — ABNORMAL LOW (ref 12.0–15.0)
Hemoglobin: 11.4 g/dL — ABNORMAL LOW (ref 12.0–15.0)
MCH: 34.3 pg — ABNORMAL HIGH (ref 26.0–34.0)
MCHC: 34.7 g/dL (ref 30.0–36.0)
MCV: 100 fL (ref 78.0–100.0)
RBC: 2.71 MIL/uL — ABNORMAL LOW (ref 3.87–5.11)
RBC: 3.33 MIL/uL — ABNORMAL LOW (ref 3.87–5.11)
WBC: 8.8 10*3/uL (ref 4.0–10.5)

## 2012-01-08 LAB — BASIC METABOLIC PANEL
BUN: 3 mg/dL — ABNORMAL LOW (ref 6–23)
Chloride: 114 mEq/L — ABNORMAL HIGH (ref 96–112)
GFR calc Af Amer: >90 mL/min (ref >90)
Potassium: 4 mEq/L (ref 3.5–5.1)
Sodium: 144 mEq/L (ref 135–145)

## 2012-01-08 LAB — URINE MICROSCOPIC-ADD ON

## 2012-01-08 MED ORDER — PEG 3350-KCL-NA BICARB-NACL 420 G PO SOLR
4000.0000 mL | Freq: Once | ORAL | Status: AC
  Administered 2012-01-08: 4000 mL via ORAL
  Filled 2012-01-08: qty 4000

## 2012-01-08 MED ORDER — HYDRALAZINE HCL 20 MG/ML IJ SOLN
10.0000 mg | Freq: Once | INTRAMUSCULAR | Status: AC
  Administered 2012-01-08: 23:00:00 via INTRAVENOUS
  Filled 2012-01-08: qty 0.5

## 2012-01-08 NOTE — Progress Notes (Signed)
Patient ID: Anagabriela L Hijazi, female   DOB: 05/16/1961, 51 y.o.   MRN: [id number] [facility] Progress Note  Ahmari L Files 51 y.o. 04/04/1961   Subjective: No bleeding overnight. Reports brown stools. Feels ok. Hgb 11.4 up from 9.3 after transfusion.  Objective: Vital signs in last 24 hours: Filed Vitals:   01/08/12 1145  BP: 149/76  Pulse: 85  Temp: 98.4 F (36.9 C)  Resp: 19    Physical Exam: Gen: alert, no acute distress Abd: LUQ tenderness with minimal guarding otherwise NT, soft, ND, +BS  Lab Results:  Basename 01/08/12 0233 01/07/12 1452 01/07/12 0234  NA 144 144 --  K 4.0 4.0 --  CL 114* 115* --  CO2 23 23 --  GLUCOSE 97 161* --  BUN 3* 5* --  CREATININE 0.65 0.61 --  CALCIUM 7.8* 7.6* --  MG -- 1.9 2.3  PHOS -- -- --    Basename 01/07/12 0234 01/06/12 2324  AST 20 25  ALT 15 17  ALKPHOS 49 55  BILITOT 0.1* 0.1*  PROT 4.8* 5.4*  ALBUMIN 2.6* 2.9*    Basename 01/08/12 0832 01/08/12 0233 01/06/12 2324  WBC 9.0 8.8 --  NEUTROABS -- -- 9.7*  HGB 11.4* 9.3* --  HCT 32.9* 27.1* --  MCV 98.8 100.0 --  PLT 208 218 --    Basename 01/06/12 2324  LABPROT 14.3  INR 1.09      Assessment/Plan: Resolved GI bleed with EGD showing tiny gastric ulcer that I doubt is the source of the bleeding. Would do an inpatient colonoscopy to further evaluate. Will prep today and do colonoscopy tomorrow morning. If colonoscopy unrevealing, then ok to go home from a GI standpoint tomorrow and will need to avoid all NSAIDs.   Donelle Hise C. 01/08/2012, 12:34 PM    

## 2012-01-08 NOTE — Progress Notes (Signed)
[facility] PROGRESS NOTE  LATRAVIA Frederick ZOX:[id number] DOB: November 18, 1960 DOA: 01/06/2012 PCP: Crawford Givens, MD  Assessment/Plan: Principal Problem:  *GIB  Likely lower GI but due to her h/o ETOH abuse and goody powder use, she underwent an EGD which reveals only gastritis and small ulcer- nonbleeding Bleeding has stopped for now. Being prepped for colonoscopy tonight.  Colitis CT from [facility] revealed left sided colitis which may be the source of bleed. Less tender today. Fevers yesterday. WAs unable to tolerate PO antibiotics at home.  Cont Unasyn - QT prolonged therefore will avoid flouroquinolones.   Active Problems:  Anemia due to blood loss, acute Hgb improved- 2 u PRBC transfused on 8/2.    HYPERTENSION/ hypotension Cut back on IVF today but continue due to prep tonight.    Hypokalemia Replaced.    Leukocytosis Improved-    Alcohol abuse Follow on CIWA scale. No signs of withdrawal yet.    Code Status: Full Disposition Plan: keep in SDU   Brief narrative: Patient has had a history of recent bleeding. She was in the emergency room in [place] following rectal bleeding several days ago and probably had a CT scan. Her family notes that she has had some rectal bleeding for about 2 weeks and somewhere along the way had a kidney stone. He reports that she was in the emergency room in [place] her hemoglobin was 16. She continued to feel weak and passing dark tarry stools and some maroonish stools and was subsequently brought to the emergency room,. At this point, she was found to have a potassium of 1.8 and a hemoglobin of 11. Since her hospitalization, her hemoglobin has dropped from 11.5-8.8 and she has had some maroonish stools. Other labs are remarkable for normal bilirubin AST and ALT with a somewhat low albumin of 2.9. Her INR is 1.09. The patient has a history of drinking he drinks at least 3 shots or more of whiskey a day. She also takes daily BC powders for chronic  headaches. She denies ever having EGD . She apparently vomited yesterday her fianc indicates this was food in clear material with no signs of blood.   Consultants:  GI- Dr Randa Evens   HPI/Subjective: Feeling better today. Less pain in abdomen. No further bleeding per rectum.   Objective: Filed Vitals:   01/08/12 1000 01/08/12 1145 01/08/12 1200 01/08/12 1508  BP: 126/57 149/76 158/73 173/75  Pulse: 81 85 80 84  Temp:  98.4 F (36.9 C)  97.8 F (36.6 C)  TempSrc:  Oral  Axillary  Resp: 15 19 20    Height:      Weight:      SpO2: 98% 97% 99% 99%    Intake/Output Summary (Last 24 hours) at 01/08/12 2024 Last data filed at 01/08/12 1907  Gross per 24 hour  Intake   7233 ml  Output   3075 ml  Net   4158 ml    Exam:   General:  Weak and pale appearing, mild distress.   Cardiovascular: RRR, tachycardic  Respiratory: CTA b/l  Abdomen: soft, tender in left abdomen and suprapubic area, BS+  Ext: no c/c/e  Data Reviewed: Basic Metabolic Panel:  Lab 01/08/12 8119 01/07/12 1452 01/07/12 0234 01/06/12 2324  NA 144 144 140 139  K 4.0 4.0 2.7* 1.8*  CL 114* 115* 106 102  CO2 23 23 22 23   GLUCOSE 97 161* 122* 95  BUN 3* 5* 9 11  CREATININE 0.65 0.61 0.71 0.85  CALCIUM 7.8* 7.6* 7.5*  8.3*  MG -- 1.9 2.3 --  PHOS -- -- -- --   Liver Function Tests:  Lab 01/07/12 0234 01/06/12 2324  AST 20 25  ALT 15 17  ALKPHOS 49 55  BILITOT 0.1* 0.1*  PROT 4.8* 5.4*  ALBUMIN 2.6* 2.9*   No results found for this basename: LIPASE:5,AMYLASE:5 in the last 168 hours No results found for this basename: AMMONIA:5 in the last 168 hours CBC:  Lab 01/08/12 0832 01/08/12 0233 01/07/12 2011 01/07/12 1452 01/07/12 0809 01/06/12 2324  WBC 9.0 8.8 10.3 8.1 7.8 --  NEUTROABS -- -- -- -- -- 9.7*  HGB 11.4* 9.3* 8.3* 8.4* 8.8* --  HCT 32.9* 27.1* 24.2* 24.0* 25.1* --  MCV 98.8 100.0 104.3* 102.6* 101.2* --  PLT 208 218 239 230 252 --   Cardiac Enzymes: No results found for this  basename: CKTOTAL:5,CKMB:5,CKMBINDEX:5,TROPONINI:5 in the last 168 hours BNP (last 3 results) No results found for this basename: PROBNP:3 in the last 8760 hours CBG: No results found for this basename: GLUCAP:5 in the last 168 hours  Recent Results (from the past 240 hour(s))  MRSA PCR SCREENING     Status: Normal   Collection Time   01/07/12  2:29 AM      Component Value Range Status Comment   MRSA by PCR NEGATIVE  NEGATIVE Final      Studies: No results found.  Scheduled Meds:    . ampicillin-sulbactam (UNASYN) IV  3 g Intravenous Q6H  . pantoprazole (PROTONIX) IV  40 mg Intravenous Q12H  . polyethylene glycol-electrolytes  4,000 mL Oral Once  . sodium chloride  3 mL Intravenous Q12H   Continuous Infusions:    . dextrose 5 % and 0.9 % NaCl with KCl 40 mEq/L 75 mL/hr at 01/08/12 1907    ________________________________________________________________________  Time spent: 50 min    [facility]  [facility] Pager [phone] If 8PM-8AM, please contact night-coverage at [email or web address], password [facility] 01/08/2012, 8:24 PM  LOS: 2 days

## 2012-01-09 ENCOUNTER — Encounter (HOSPITAL_COMMUNITY)
Admission: EM | Disposition: A | Discharge status: Home / Self Care | Payer: Self-pay | Source: Home / Self Care | Attending: Internal Medicine

## 2012-01-09 ENCOUNTER — Encounter (HOSPITAL_COMMUNITY): Payer: Self-pay | Admitting: *Deleted

## 2012-01-09 HISTORY — PX: COLONOSCOPY: SHX5424

## 2012-01-09 LAB — CBC
MCH: 35 pg — ABNORMAL HIGH (ref 26.0–34.0)
MCHC: 35.3 g/dL (ref 30.0–36.0)
Platelets: 257 10*3/uL (ref 150–400)
RBC: 3.46 MIL/uL — ABNORMAL LOW (ref 3.87–5.11)

## 2012-01-09 LAB — TYPE AND SCREEN
ABO/RH(D): O POS
Antibody Screen: NEGATIVE
Unit division: 0

## 2012-01-09 LAB — BASIC METABOLIC PANEL
Calcium: 8.8 mg/dL (ref 8.4–10.5)
GFR calc non Af Amer: >90 mL/min (ref >90)
Potassium: 3.6 mEq/L (ref 3.5–5.1)
Sodium: 146 mEq/L — ABNORMAL HIGH (ref 135–145)

## 2012-01-09 LAB — URINE CULTURE: Colony Count: NO GROWTH

## 2012-01-09 SURGERY — COLONOSCOPY
Anesthesia: Moderate Sedation

## 2012-01-09 MED ORDER — MIDAZOLAM HCL 10 MG/2ML IJ SOLN
INTRAMUSCULAR | Status: AC
  Filled 2012-01-09: qty 4

## 2012-01-09 MED ORDER — DIPHENHYDRAMINE HCL 50 MG/ML IJ SOLN
INTRAMUSCULAR | Status: DC | PRN
  Administered 2012-01-09 (×2): 25 mg via INTRAVENOUS

## 2012-01-09 MED ORDER — AMOXICILLIN-POT CLAVULANATE 875-125 MG PO TABS: 1.0000 | ORAL_TABLET | Freq: Two times a day (BID) | ORAL | Status: AC

## 2012-01-09 MED ORDER — OMEPRAZOLE 20 MG PO CPDR: 20.0000 mg | DELAYED_RELEASE_CAPSULE | Freq: Every day | ORAL | Status: DC

## 2012-01-09 MED ORDER — OMEPRAZOLE 20 MG PO CPDR: 20.0000 mg | DELAYED_RELEASE_CAPSULE | Freq: Two times a day (BID) | ORAL | Status: DC

## 2012-01-09 MED ORDER — MIDAZOLAM HCL 5 MG/5ML IJ SOLN
INTRAMUSCULAR | Status: DC | PRN
  Administered 2012-01-09: 1 mg via INTRAVENOUS
  Administered 2012-01-09: 2 mg via INTRAVENOUS
  Administered 2012-01-09: 1 mg via INTRAVENOUS
  Administered 2012-01-09: 2 mg via INTRAVENOUS
  Administered 2012-01-09: 1 mg via INTRAVENOUS

## 2012-01-09 MED ORDER — CIPROFLOXACIN HCL 500 MG PO TABS
500.0000 mg | ORAL_TABLET | Freq: Two times a day (BID) | ORAL | Status: DC
  Administered 2012-01-09: 500 mg via ORAL
  Filled 2012-01-09 (×3): qty 1

## 2012-01-09 MED ORDER — PANTOPRAZOLE SODIUM 40 MG PO TBEC
40.0000 mg | DELAYED_RELEASE_TABLET | Freq: Every day | ORAL | Status: DC
  Administered 2012-01-09: 40 mg via ORAL
  Filled 2012-01-09 (×2): qty 1

## 2012-01-09 MED ORDER — SODIUM CHLORIDE 0.9 % IV SOLN
Freq: Once | INTRAVENOUS | Status: AC
  Administered 2012-01-09: 20 mL via INTRAVENOUS

## 2012-01-09 MED ORDER — FENTANYL CITRATE 0.05 MG/ML IJ SOLN
INTRAMUSCULAR | Status: AC
  Filled 2012-01-09: qty 4

## 2012-01-09 MED ORDER — DIPHENHYDRAMINE HCL 50 MG/ML IJ SOLN
INTRAMUSCULAR | Status: AC
  Filled 2012-01-09: qty 1

## 2012-01-09 MED ORDER — FENTANYL CITRATE 0.05 MG/ML IJ SOLN
INTRAMUSCULAR | Status: DC | PRN
  Administered 2012-01-09 (×4): 25 ug via INTRAVENOUS

## 2012-01-09 NOTE — Brief Op Note (Signed)
Diffuse diverticulosis. Internal hemorrhoids. Small polyp removed (obliterated no path to send). NO bleeding seen. Suspect bleeding was from NSAIDs or possibly from her diverticulosis. Needs to avoid all NSAIDs. She reports her mother died of colon cancer so would recommend repeat in 5 years instead of 10 years.   Start heart healthy diet.  Ok to go home from our standpoint.

## 2012-01-09 NOTE — Op Note (Addendum)
[facility] [address] [place], [place]  [address]  COLONOSCOPY PROCEDURE REPORT  PATIENT:  [name], [name]  MR#:  [id number] BIRTHDATE:  08-04-1960, 51 yrs. old  GENDER:  female ENDOSCOPIST:  Charlott Rakes, MD REF. BY: PROCEDURE DATE:  01/09/2012 PROCEDURE:  Colonoscopy with snare polypectomy ASA CLASS:  Class II INDICATIONS:  Gastrointestinal hemorrhage MEDICATIONS:   Fentanyl 100 mcg IV, Benadryl 50 mg IV, Versed 7 mg IV  DESCRIPTION OF PROCEDURE:   After the risks benefits and alternatives of the procedure were thoroughly explained, informed consent was obtained.  The Pentax Ped Colon [id number] endoscope was introduced through the anus and advanced to the cecum, which was identified by both the appendix and ileocecal valve, limited by a tortuous colon, looping difficulties, diverticulosis, severe.    The quality of the prep was good.  The instrument was then slowly withdrawn as the colon was fully examined. <<PROCEDUREIMAGES>>  FINDINGS:  Rectal exam unremarkable.  Pediatric colonoscope inserted into the colon and advanced to the cecum, where the appendiceal orifice and ileocecal valve were identified.   In order to reach the cecum repeated loop reduction was necessary as was turning the patient in the supine position due to multiple diverticuli in the sigmoid colon as well as a tortuous colon. Multiple diverticuli were seen throughout the colon. On careful withdrawal of the colonoscope the diffuse diverticuli were again noted. A 3 mm sessile polyp was seen in the sigmoid colon that was removed with snare cautery. No polyp tissue was able to be retrieved following snare cautery.   Retroflexion revealed medium-sized internal hemorrhoids. There were no blood products or bleeding noted throughout the colon. There were scattered areas of clear yellowish greenish stool products seen that was irrigated away or suctioned to achieve a good and  adequate prep.  COMPLICATIONS:  None  IMPRESSION:     1. Diffuse diverticulosis 2. Medium-sized internal hemorrhoids 3. Small colon polyp -s/p snare cautery: no path obtained 4. No GI bleeding noted  RECOMMENDATIONS:   1. Start heart healthy diet now (high fiber as well as outpatient) 2. No further GI workup needed 3. Repeat colonoscopy in 5 years due to family history of colon cancer (mother) 4. Avoid NSAIDs  ______________________________ Charlott Rakes, MD  CC:  n. REVISED:  01/09/2012 11:05 AM eSIGNED:   Charlott Rakes at 01/09/2012 11:05 AM  Ramon Dredge, [id number]

## 2012-01-09 NOTE — Progress Notes (Signed)
   CARE MANAGEMENT NOTE 01/09/2012  Patient:  LAURALI, GODDARD   Account Number:  [id number]  Date Initiated:  01/09/2012  Documentation initiated by:  [facility]  Subjective/Objective Assessment:     Action/Plan:   no insurance coverage   Anticipated DC Date:  01/09/2012   Anticipated DC Plan:  HOME/SELF CARE      DC Planning Services  CM consult  Medication Assistance  Indigent Health Clinic      Choice offered to / List presented to:             Status of service:  Completed, signed off Medicare Important Message given?   (If response is "NO", the following Medicare IM given date fields will be blank) Date Medicare IM given:   Date Additional Medicare IM given:    Discharge Disposition:  HOME/SELF CARE  Per UR Regulation:    If discussed at Long Length of Stay Meetings, dates discussed:    Comments:  01/09/12 1420 Contacted main pharmacy and eligible for Z[employer] medication assistance fund. NCM spoke to pt and explained fund can be used once per year. Will fill abx Rx using ZZ fund. States she does not have any drug coverage or insurance. Provided info about discount pharmacies [facility], [facility] and [facility]. Explained they offer discount prices for their generic meds. Provided pt with info on [employer], other medical clinics, [employer], [employer] representative and other community resources. Also a community pharmacy discount card that may assist with meds paid for out of pocket. Isidoro Donning RN CCM Case Mgmt phone [phone]

## 2012-01-09 NOTE — H&P (View-Only) (Signed)
Patient ID: [name], female   DOB: [birth year], 51 y.o.   MRN: [id number] [facility] Progress Note  [name] 51 y.o. 12/18/1960   Subjective: No bleeding overnight. Reports brown stools. Feels ok. Hgb 11.4 up from 9.3 after transfusion.  Objective: Vital signs in last 24 hours: Filed Vitals:   01/08/12 1145  BP: 149/76  Pulse: 85  Temp: 98.4 F (36.9 C)  Resp: 19    Physical Exam: Gen: alert, no acute distress Abd: LUQ tenderness with minimal guarding otherwise NT, soft, ND, +BS  Lab Results:  [name] 01/08/12 0233 01/07/12 1452 01/07/12 0234  NA 144 144 --  K 4.0 4.0 --  CL 114* 115* --  CO2 23 23 --  GLUCOSE 97 161* --  BUN 3* 5* --  CREATININE 0.65 0.61 --  CALCIUM 7.8* 7.6* --  MG -- 1.9 2.3  PHOS -- -- --    Basename 01/07/12 0234 01/06/12 2324  AST 20 25  ALT 15 17  ALKPHOS 49 55  BILITOT 0.1* 0.1*  PROT 4.8* 5.4*  ALBUMIN 2.6* 2.9*    Basename 01/08/12 0832 01/08/12 0233 01/06/12 2324  WBC 9.0 8.8 --  NEUTROABS -- -- 9.7*  HGB 11.4* 9.3* --  HCT 32.9* 27.1* --  MCV 98.8 100.0 --  PLT 208 218 --    Basename 01/06/12 2324  LABPROT 14.3  INR 1.09      Assessment/Plan: Resolved GI bleed with EGD showing tiny gastric ulcer that I doubt is the source of the bleeding. Would do an inpatient colonoscopy to further evaluate. Will prep today and do colonoscopy tomorrow morning. If colonoscopy unrevealing, then ok to go home from a GI standpoint tomorrow and will need to avoid all NSAIDs.   Smt Lokey C. 01/08/2012, 12:34 PM

## 2012-01-09 NOTE — Interval H&P Note (Signed)
History and Physical Interval Note:  01/09/2012 9:52 AM  Sheppard Plumber  has presented today for surgery, with the diagnosis of bleeding  The various methods of treatment have been discussed with the patient and family. After consideration of risks, benefits and other options for treatment, the patient has consented to  Procedure(s) (LRB): COLONOSCOPY (N/A) as a surgical intervention .  The patient's history has been reviewed, patient examined, no change in status, stable for surgery.  I have reviewed the patient's chart and labs.  Questions were answered to the patient's satisfaction.     Maurice Ramseur C.

## 2012-01-10 ENCOUNTER — Encounter (HOSPITAL_COMMUNITY): Payer: Self-pay | Admitting: Gastroenterology

## 2012-01-11 ENCOUNTER — Encounter (HOSPITAL_COMMUNITY): Payer: Self-pay | Admitting: Gastroenterology

## 2012-01-14 LAB — CULTURE, BLOOD (ROUTINE X 2)
Culture: NO GROWTH
Culture: NO GROWTH

## 2012-01-26 NOTE — Discharge Summary (Signed)
Physician Discharge Summary  [name] ZOX:[id number] DOB: [birth year] DOA: 01/06/2012  PCP: Crawford Givens, MD  Admit date: 01/06/2012 Discharge date: 01/26/2012  Recommendations for Outpatient Follow-up:  1. F/U With PCP in 1 wk  Discharge Diagnoses:  Principal Problem:  *GIB (gastrointestinal bleeding) Active Problems:  Anemia due to blood loss, acute  HYPERTENSION/ hypotension  Hypokalemia  Leukocytosis  Alcohol abuse   Discharge Condition: stable  Diet recommendation: heart healthy  Filed Weights   01/07/12 0235  Weight: 48.8 kg (107 lb 9.4 oz)    History of present illness:   *A 50 year old female presenting with rectal bleeding for 2 weeks. Patient apparently started having mild abdominal pain and is followed by massive rectal bleed which is getting worse in the last 2 days. She was seen at an outside hospital initially over the last 2 weeks. She was doing fine with a hemoglobin of 16 g. That has dropped to 11 g tonight. Patient also felt weak tired and unable to function properly so she can emergency room. In the ER she was found to have grossly bloody stools leukocytosis and profound hypokalemia with potassium of 1.8. She also had of his EKG changes consistent with severe hypokalemia. She has been taking Goody's powder for the past 15 years. Initially she takes one twice a day but lately she's been taking just once a day.  Hospital Course:    *GI Bleed Likely lower GI as she presents with a colitis picture but due to her h/o ETOH abuse and goody powder use, she underwent an EGD first which revealed  gastritis and small ulcer- nonbleeding - Advised to avoid NSAIDS Bleeding has stopped for now. Colonoscopy reveals, diffuse diverticulosis and moderate sized internal hemorrhoids.   Colitis  CT from [facility] revealed left sided colitis which may be the source of bleed. This was partially treated  She was noted to have left sided tenderness and  fevers upon presentation. She was  unable to tolerate PO antibiotics at home due to continued vomiting.   She has done well with Unasyn and symptoms have resolved  - QT was significantly prolonged therefore have avoided flouroquinolones.   Anemia due to blood loss, acute  Hgb improved- 2 u PRBC transfused on 8/2.   HYPERTENSION/ hypotension  Was Hypotensive on admission- she was hydrated appropriately and has improved.   Hypokalemia  Replaced.   Leukocytosis due to colitis Improved-   Alcohol abuse  Followed on CIWA scale. No signs of withdrawal noted.   Procedures:  8/2 EGD  8/4 Colonoscopy  Consultations:  GI- Dr Bosie Clos  Discharge Exam: Filed Vitals:   01/09/12 1115  BP: 151/88  Pulse: 87  Temp: 97.6 F (36.4 C)  Resp: 13   Filed Vitals:   01/09/12 1100 01/09/12 1105 01/09/12 1110 01/09/12 1115  BP: 158/81 158/81 151/88 151/88  Pulse:    87  Temp:    97.6 F (36.4 C)  TempSrc:    Oral  Resp: 17 16 26 13   Height:      Weight:      SpO2: 98% 97% 98% 96%    General: alert and in no distress Cardiovascular: RRR, No murmurs Respiratory: CTA b/l Abdomen: soft, NT, ND, BS+  Discharge Instructions  Discharge Orders    Future Orders Please Complete By Expires   Diet - low sodium heart healthy      Comments:   Bland, no spices or caffeine, no alcohol   Increase activity slowly  Discharge instructions      Comments:   Avoid Asprin, Ibuprofen, Naprosyn (Alleve), Advil, Motrin     Medication List  As of 01/26/2012  5:12 PM   STOP taking these medications         ibuprofen 600 MG tablet      metroNIDAZOLE 500 MG tablet         TAKE these medications         hydrochlorothiazide 25 MG tablet   Commonly known as: HYDRODIURIL   Take 25 mg by mouth daily.      omeprazole 20 MG capsule   Commonly known as: PRILOSEC   Take 1 capsule (20 mg total) by mouth 2 (two) times daily.      ondansetron 4 MG tablet   Commonly known as: ZOFRAN   Take 4 mg by mouth every 8 (eight) hours as  needed. For nausea      OXYCODONE HCL PO   Take 1 tablet by mouth 2 (two) times daily.           Augmentin 875/125 BID x 10 days- due to being unable to afford this, we have provided it for her via the         [employer].           The results of significant diagnostics from this hospitalization (including imaging, microbiology, ancillary and laboratory) are listed below for reference.    Significant Diagnostic Studies: Dg Chest Port 1 View  01/07/2012  *RADIOLOGY REPORT*  Clinical Data: Fever.  PORTABLE CHEST - 1 VIEW  Comparison: Chest x-ray 01/09/2011.  Findings: Lung volumes are normal.  No consolidative airspace disease.  No pleural effusions.  No pneumothorax.  No pulmonary nodule or mass noted.  Pulmonary vasculature and the cardiomediastinal silhouette are within normal limits.  IMPRESSION: 1. No radiographic evidence of acute cardiopulmonary disease.  Original Report Authenticated By: Florencia Reasons, M.D.    Microbiology: No results found for this or any previous visit (from the past 240 hour(s)).   Labs: Basic Metabolic Panel: No results found for this basename: NA:5,K:5,CL:5,CO2:5,GLUCOSE:5,BUN:5,CREATININE:5,CALCIUM:5,MG:5,PHOS:5 in the last 168 hours Liver Function Tests: No results found for this basename: AST:5,ALT:5,ALKPHOS:5,BILITOT:5,PROT:5,ALBUMIN:5 in the last 168 hours No results found for this basename: LIPASE:5,AMYLASE:5 in the last 168 hours No results found for this basename: AMMONIA:5 in the last 168 hours CBC: No results found for this basename: WBC:5,NEUTROABS:5,HGB:5,HCT:5,MCV:5,PLT:5 in the last 168 hours Cardiac Enzymes: No results found for this basename: CKTOTAL:5,CKMB:5,CKMBINDEX:5,TROPONINI:5 in the last 168 hours BNP: BNP (last 3 results) No results found for this basename: PROBNP:3 in the last 8760 hours CBG: No results found for this basename: GLUCAP:5 in the last 168 hours  Time coordinating discharge: >45  minutes  Signed:  Kamarri Lovvorn  [facility] 01/26/2012, 5:12 PM

## 2012-08-18 ENCOUNTER — Ambulatory Visit: Payer: Self-pay | Admitting: Family Medicine

## 2012-10-04 ENCOUNTER — Ambulatory Visit: Payer: Self-pay | Admitting: Physician Assistant

## 2012-10-10 ENCOUNTER — Emergency Department: Payer: Self-pay | Admitting: Emergency Medicine

## 2012-10-10 LAB — URINALYSIS, COMPLETE
Bacteria: NONE SEEN
Bilirubin,UR: NEGATIVE
Blood: NEGATIVE
Glucose,UR: NEGATIVE mg/dL (ref 0–75)
Hyaline Cast: 71
Ketone: NEGATIVE
Leukocyte Esterase: NEGATIVE
Nitrite: NEGATIVE
RBC,UR: 1 /HPF (ref 0–5)
Squamous Epithelial: 4
WBC UR: 5 /HPF (ref 0–5)

## 2012-10-10 LAB — COMPREHENSIVE METABOLIC PANEL
Albumin: 4.6 g/dL (ref 3.4–5.0)
Alkaline Phosphatase: 111 U/L (ref 50–136)
Anion Gap: 7 (ref 7–16)
BUN: 20 mg/dL — ABNORMAL HIGH (ref 7–18)
Calcium, Total: 10 mg/dL (ref 8.5–10.1)
Chloride: 103 mmol/L (ref 98–107)
Creatinine: 1.54 mg/dL — ABNORMAL HIGH (ref 0.60–1.30)
EGFR (African American): 45 — ABNORMAL LOW
EGFR (Non-African Amer.): 39 — ABNORMAL LOW
Glucose: 105 mg/dL — ABNORMAL HIGH (ref 65–99)
Potassium: 3.9 mmol/L (ref 3.5–5.1)
SGOT(AST): 25 U/L (ref 15–37)
SGPT (ALT): 20 U/L (ref 12–78)
Sodium: 137 mmol/L (ref 136–145)
Total Protein: 8.1 g/dL (ref 6.4–8.2)

## 2012-10-10 LAB — CBC
HCT: 46.3 % (ref 35.0–47.0)
Platelet: 255 10*3/uL (ref 150–440)
RBC: 4.47 10*6/uL (ref 3.80–5.20)
RDW: 12.3 % (ref 11.5–14.5)
WBC: 14.1 10*3/uL — ABNORMAL HIGH (ref 3.6–11.0)

## 2012-10-10 LAB — LIPASE, BLOOD: Lipase: 183 U/L (ref 73–393)

## 2012-11-11 ENCOUNTER — Emergency Department: Payer: Self-pay | Admitting: Emergency Medicine

## 2012-11-11 LAB — ETHANOL
Ethanol %: 0.163 % — ABNORMAL HIGH (ref 0.000–0.080)
Ethanol: 163 mg/dL

## 2012-11-11 LAB — COMPREHENSIVE METABOLIC PANEL
Albumin: 4 g/dL (ref 3.4–5.0)
Anion Gap: 9 (ref 7–16)
Bilirubin,Total: 0.3 mg/dL (ref 0.2–1.0)
Chloride: 108 mmol/L — ABNORMAL HIGH (ref 98–107)
Co2: 24 mmol/L (ref 21–32)
EGFR (Non-African Amer.): >60
Glucose: 106 mg/dL — ABNORMAL HIGH (ref 65–99)
Potassium: 3.3 mmol/L — ABNORMAL LOW (ref 3.5–5.1)
SGOT(AST): 43 U/L — ABNORMAL HIGH (ref 15–37)
Sodium: 141 mmol/L (ref 136–145)
Total Protein: 7.5 g/dL (ref 6.4–8.2)

## 2012-11-11 LAB — CK TOTAL AND CKMB (NOT AT ARMC)
CK, Total: 34 U/L (ref 21–215)
CK-MB: <0.5 ng/mL — ABNORMAL LOW (ref 0.5–3.6)

## 2012-11-11 LAB — CBC
HCT: 45.2 % (ref 35.0–47.0)
HGB: 15.8 g/dL (ref 12.0–16.0)
MCHC: 34.9 g/dL (ref 32.0–36.0)
MCV: 104 fL — ABNORMAL HIGH (ref 80–100)
Platelet: 233 10*3/uL (ref 150–440)
WBC: 10.2 10*3/uL (ref 3.6–11.0)

## 2012-11-11 LAB — LIPASE, BLOOD: Lipase: 186 U/L (ref 73–393)

## 2012-12-05 ENCOUNTER — Ambulatory Visit: Payer: Self-pay | Admitting: Physician Assistant

## 2012-12-21 ENCOUNTER — Ambulatory Visit: Payer: Self-pay | Admitting: Internal Medicine

## 2012-12-28 ENCOUNTER — Inpatient Hospital Stay: Payer: Self-pay | Admitting: Student

## 2012-12-28 LAB — URINALYSIS, COMPLETE
Bacteria: NONE SEEN
Bilirubin,UR: NEGATIVE
Glucose,UR: NEGATIVE mg/dL (ref 0–75)
Nitrite: NEGATIVE
Ph: 6 (ref 4.5–8.0)
Protein: NEGATIVE
Specific Gravity: 1.013 (ref 1.003–1.030)
Squamous Epithelial: 4
WBC UR: 16 /HPF (ref 0–5)

## 2012-12-28 LAB — TROPONIN I
Troponin-I: <0.02 ng/mL
Troponin-I: <0.02 ng/mL

## 2012-12-28 LAB — CBC
HGB: 17.7 g/dL — ABNORMAL HIGH (ref 12.0–16.0)
MCH: 36.5 pg — ABNORMAL HIGH (ref 26.0–34.0)
MCV: 105 fL — ABNORMAL HIGH (ref 80–100)
Platelet: 246 10*3/uL (ref 150–440)
RDW: 13 % (ref 11.5–14.5)
WBC: 12.8 10*3/uL — ABNORMAL HIGH (ref 3.6–11.0)

## 2012-12-28 LAB — BASIC METABOLIC PANEL
BUN: 25 mg/dL — ABNORMAL HIGH (ref 7–18)
Calcium, Total: 10.2 mg/dL — ABNORMAL HIGH (ref 8.5–10.1)
Chloride: 102 mmol/L (ref 98–107)
Creatinine: 1.9 mg/dL — ABNORMAL HIGH (ref 0.60–1.30)
EGFR (Non-African Amer.): 30 — ABNORMAL LOW
Glucose: 103 mg/dL — ABNORMAL HIGH (ref 65–99)
Osmolality: 278 (ref 275–301)
Potassium: 4.2 mmol/L (ref 3.5–5.1)
Sodium: 137 mmol/L (ref 136–145)

## 2012-12-28 LAB — CK TOTAL AND CKMB (NOT AT ARMC)
CK-MB: <0.5 ng/mL — ABNORMAL LOW (ref 0.5–3.6)
CK-MB: <0.5 ng/mL — ABNORMAL LOW (ref 0.5–3.6)

## 2012-12-28 LAB — LIPASE, BLOOD: Lipase: 153 U/L (ref 73–393)

## 2012-12-28 LAB — HEPATIC FUNCTION PANEL A (ARMC): Bilirubin,Total: 0.4 mg/dL (ref 0.2–1.0)

## 2012-12-29 LAB — COMPREHENSIVE METABOLIC PANEL
Anion Gap: 8 (ref 7–16)
Bilirubin,Total: 0.6 mg/dL (ref 0.2–1.0)
Calcium, Total: 8.3 mg/dL — ABNORMAL LOW (ref 8.5–10.1)
Co2: 24 mmol/L (ref 21–32)
Creatinine: 1.29 mg/dL (ref 0.60–1.30)
EGFR (African American): 56 — ABNORMAL LOW
EGFR (Non-African Amer.): 48 — ABNORMAL LOW
Glucose: 79 mg/dL (ref 65–99)
Potassium: 3.9 mmol/L (ref 3.5–5.1)
Sodium: 143 mmol/L (ref 136–145)

## 2012-12-29 LAB — CBC WITH DIFFERENTIAL/PLATELET
Basophil %: 1 %
Eosinophil %: 7.3 %
HGB: 14.7 g/dL (ref 12.0–16.0)
Lymphocyte #: 2 10*3/uL (ref 1.0–3.6)
Lymphocyte %: 24.3 %
MCH: 36.8 pg — ABNORMAL HIGH (ref 26.0–34.0)
MCHC: 35.1 g/dL (ref 32.0–36.0)
MCV: 105 fL — ABNORMAL HIGH (ref 80–100)
Monocyte #: 0.6 x10 3/mm (ref 0.2–0.9)
Monocyte %: 7 %
Neutrophil #: 5 10*3/uL (ref 1.4–6.5)
Platelet: 198 10*3/uL (ref 150–440)
WBC: 8.3 10*3/uL (ref 3.6–11.0)

## 2012-12-29 LAB — TROPONIN I: Troponin-I: <0.02 ng/mL

## 2012-12-29 LAB — URINE CULTURE

## 2012-12-30 LAB — BASIC METABOLIC PANEL
Calcium, Total: 8.5 mg/dL (ref 8.5–10.1)
Chloride: 113 mmol/L — ABNORMAL HIGH (ref 98–107)
Co2: 22 mmol/L (ref 21–32)
Creatinine: 1.05 mg/dL (ref 0.60–1.30)
EGFR (African American): >60
EGFR (Non-African Amer.): >60
Glucose: 86 mg/dL (ref 65–99)
Osmolality: 286 (ref 275–301)
Potassium: 3.9 mmol/L (ref 3.5–5.1)
Sodium: 144 mmol/L (ref 136–145)

## 2012-12-30 LAB — WBCS, STOOL

## 2013-01-02 LAB — STOOL CULTURE

## 2013-01-10 ENCOUNTER — Inpatient Hospital Stay (EMERGENCY_DEPARTMENT_HOSPITAL)
Admission: AD | Admit: 2013-01-10 | Discharge: 2013-01-10 | Disposition: A | Discharge status: Home / Self Care | Payer: BC Managed Care – PPO | Source: Ambulatory Visit | Attending: Obstetrics & Gynecology | Admitting: Obstetrics & Gynecology

## 2013-01-10 ENCOUNTER — Encounter (HOSPITAL_COMMUNITY): Payer: Self-pay | Admitting: *Deleted

## 2013-01-10 ENCOUNTER — Encounter (HOSPITAL_COMMUNITY): Payer: Self-pay | Admitting: Emergency Medicine

## 2013-01-10 ENCOUNTER — Emergency Department (HOSPITAL_COMMUNITY): Payer: BC Managed Care – PPO

## 2013-01-10 ENCOUNTER — Observation Stay (HOSPITAL_COMMUNITY)
Admission: EM | Admit: 2013-01-10 | Discharge: 2013-01-12 | Disposition: A | Discharge status: Home / Self Care | Payer: BC Managed Care – PPO | Attending: Internal Medicine | Admitting: Internal Medicine

## 2013-01-10 DIAGNOSIS — D62 Acute posthemorrhagic anemia: Secondary | ICD-10-CM

## 2013-01-10 DIAGNOSIS — R197 Diarrhea, unspecified: Secondary | ICD-10-CM | POA: Insufficient documentation

## 2013-01-10 DIAGNOSIS — E43 Unspecified severe protein-calorie malnutrition: Secondary | ICD-10-CM | POA: Insufficient documentation

## 2013-01-10 DIAGNOSIS — R109 Unspecified abdominal pain: Principal | ICD-10-CM | POA: Insufficient documentation

## 2013-01-10 DIAGNOSIS — R112 Nausea with vomiting, unspecified: Secondary | ICD-10-CM | POA: Insufficient documentation

## 2013-01-10 DIAGNOSIS — E861 Hypovolemia: Secondary | ICD-10-CM | POA: Insufficient documentation

## 2013-01-10 DIAGNOSIS — N179 Acute kidney failure, unspecified: Secondary | ICD-10-CM | POA: Diagnosis present

## 2013-01-10 DIAGNOSIS — E871 Hypo-osmolality and hyponatremia: Secondary | ICD-10-CM | POA: Insufficient documentation

## 2013-01-10 DIAGNOSIS — I959 Hypotension, unspecified: Secondary | ICD-10-CM | POA: Insufficient documentation

## 2013-01-10 DIAGNOSIS — R1032 Left lower quadrant pain: Secondary | ICD-10-CM

## 2013-01-10 DIAGNOSIS — R1110 Vomiting, unspecified: Secondary | ICD-10-CM

## 2013-01-10 DIAGNOSIS — K921 Melena: Secondary | ICD-10-CM

## 2013-01-10 DIAGNOSIS — E039 Hypothyroidism, unspecified: Secondary | ICD-10-CM

## 2013-01-10 DIAGNOSIS — F1010 Alcohol abuse, uncomplicated: Secondary | ICD-10-CM | POA: Diagnosis present

## 2013-01-10 DIAGNOSIS — D72829 Elevated white blood cell count, unspecified: Secondary | ICD-10-CM | POA: Diagnosis present

## 2013-01-10 DIAGNOSIS — K219 Gastro-esophageal reflux disease without esophagitis: Secondary | ICD-10-CM | POA: Insufficient documentation

## 2013-01-10 DIAGNOSIS — Z9189 Other specified personal risk factors, not elsewhere classified: Secondary | ICD-10-CM

## 2013-01-10 DIAGNOSIS — Z79899 Other long term (current) drug therapy: Secondary | ICD-10-CM | POA: Insufficient documentation

## 2013-01-10 DIAGNOSIS — F17200 Nicotine dependence, unspecified, uncomplicated: Secondary | ICD-10-CM | POA: Insufficient documentation

## 2013-01-10 DIAGNOSIS — I10 Essential (primary) hypertension: Secondary | ICD-10-CM | POA: Diagnosis present

## 2013-01-10 DIAGNOSIS — F411 Generalized anxiety disorder: Secondary | ICD-10-CM

## 2013-01-10 DIAGNOSIS — S2239XD Fracture of one rib, unspecified side, subsequent encounter for fracture with routine healing: Secondary | ICD-10-CM

## 2013-01-10 DIAGNOSIS — E876 Hypokalemia: Secondary | ICD-10-CM

## 2013-01-10 DIAGNOSIS — G8929 Other chronic pain: Secondary | ICD-10-CM

## 2013-01-10 DIAGNOSIS — K922 Gastrointestinal hemorrhage, unspecified: Secondary | ICD-10-CM

## 2013-01-10 DIAGNOSIS — K5731 Diverticulosis of large intestine without perforation or abscess with bleeding: Secondary | ICD-10-CM

## 2013-01-10 HISTORY — DX: Diverticulitis of intestine, part unspecified, without perforation or abscess without bleeding: K57.92

## 2013-01-10 LAB — CBC WITH DIFFERENTIAL/PLATELET
Basophils Absolute: 0.1 10*3/uL (ref 0.0–0.1)
Basophils Relative: 1 % (ref 0–1)
HCT: 52.4 % — ABNORMAL HIGH (ref 36.0–46.0)
Hemoglobin: 18.4 g/dL — ABNORMAL HIGH (ref 12.0–15.0)
Lymphocytes Relative: 14 % (ref 12–46)
MCHC: 35.1 g/dL (ref 30.0–36.0)
Monocytes Absolute: 0.8 10*3/uL (ref 0.1–1.0)
Monocytes Relative: 6 % (ref 3–12)
Neutro Abs: 10.2 10*3/uL — ABNORMAL HIGH (ref 1.7–7.7)
Neutrophils Relative %: 78 % — ABNORMAL HIGH (ref 43–77)
RDW: 12.6 % (ref 11.5–15.5)
WBC: 13 10*3/uL — ABNORMAL HIGH (ref 4.0–10.5)

## 2013-01-10 LAB — URINALYSIS, ROUTINE W REFLEX MICROSCOPIC
Bilirubin Urine: NEGATIVE
Glucose, UA: NEGATIVE mg/dL
Hgb urine dipstick: NEGATIVE
Ketones, ur: NEGATIVE mg/dL
Nitrite: NEGATIVE
Protein, ur: NEGATIVE mg/dL
Specific Gravity, Urine: 1.015 (ref 1.005–1.030)
Urobilinogen, UA: 0.2 mg/dL (ref 0.0–1.0)
Urobilinogen, UA: 0.2 mg/dL (ref 0.0–1.0)
pH: 6 (ref 5.0–8.0)

## 2013-01-10 LAB — COMPREHENSIVE METABOLIC PANEL
AST: 19 U/L (ref 0–37)
Albumin: 4 g/dL (ref 3.5–5.2)
Alkaline Phosphatase: 101 U/L (ref 39–117)
CO2: 15 mEq/L — ABNORMAL LOW (ref 19–32)
Chloride: 95 mEq/L — ABNORMAL LOW (ref 96–112)
Creatinine, Ser: 1.81 mg/dL — ABNORMAL HIGH (ref 0.50–1.10)
GFR calc non Af Amer: 32 mL/min — ABNORMAL LOW (ref >90)
Potassium: 4.8 mEq/L (ref 3.5–5.1)
Total Bilirubin: 0.3 mg/dL (ref 0.3–1.2)

## 2013-01-10 LAB — CBC
HCT: 46 % (ref 36.0–46.0)
Hemoglobin: 16.6 g/dL — ABNORMAL HIGH (ref 12.0–15.0)
Hemoglobin: 15.7 g/dL — ABNORMAL HIGH (ref 12.0–15.0)
MCH: 35.5 pg — ABNORMAL HIGH (ref 26.0–34.0)
MCH: 35.7 pg — ABNORMAL HIGH (ref 26.0–34.0)
MCV: 104.5 fL — ABNORMAL HIGH (ref 78.0–100.0)
Platelets: 258 10*3/uL (ref 150–400)
RBC: 4.67 MIL/uL (ref 3.87–5.11)
RBC: 4.4 MIL/uL (ref 3.87–5.11)
WBC: 12.6 10*3/uL — ABNORMAL HIGH (ref 4.0–10.5)

## 2013-01-10 LAB — RAPID URINE DRUG SCREEN, HOSP PERFORMED
Barbiturates: NOT DETECTED
Benzodiazepines: POSITIVE — AB

## 2013-01-10 LAB — URINE MICROSCOPIC-ADD ON

## 2013-01-10 MED ORDER — ONDANSETRON HCL 4 MG/2ML IJ SOLN: 4.0000 mg | Freq: Four times a day (QID) | INTRAMUSCULAR | Status: DC | PRN

## 2013-01-10 MED ORDER — ONDANSETRON HCL 4 MG/2ML IJ SOLN
4.0000 mg | Freq: Once | INTRAMUSCULAR | Status: AC
  Administered 2013-01-10: 4 mg via INTRAVENOUS
  Filled 2013-01-10: qty 2

## 2013-01-10 MED ORDER — HYDROMORPHONE HCL PF 1 MG/ML IJ SOLN
1.0000 mg | Freq: Once | INTRAMUSCULAR | Status: AC
  Administered 2013-01-10: 1 mg via INTRAMUSCULAR
  Filled 2013-01-10: qty 1

## 2013-01-10 MED ORDER — FOLIC ACID 1 MG PO TABS
1.0000 mg | ORAL_TABLET | Freq: Every day | ORAL | Status: DC
  Administered 2013-01-11 – 2013-01-12 (×2): 1 mg via ORAL
  Filled 2013-01-10 (×2): qty 1

## 2013-01-10 MED ORDER — SODIUM CHLORIDE 0.9 % IV SOLN
INTRAVENOUS | Status: DC
  Administered 2013-01-10 – 2013-01-12 (×3): via INTRAVENOUS

## 2013-01-10 MED ORDER — MORPHINE SULFATE 4 MG/ML IJ SOLN
4.0000 mg | Freq: Once | INTRAMUSCULAR | Status: AC
  Administered 2013-01-10: 4 mg via INTRAVENOUS
  Filled 2013-01-10: qty 1

## 2013-01-10 MED ORDER — KETOROLAC TROMETHAMINE 15 MG/ML IJ SOLN
15.0000 mg | Freq: Once | INTRAMUSCULAR | Status: AC
  Administered 2013-01-10: 15 mg via INTRAVENOUS
  Filled 2013-01-10: qty 1

## 2013-01-10 MED ORDER — ONDANSETRON HCL 4 MG/2ML IJ SOLN: 4.0000 mg | Freq: Three times a day (TID) | INTRAMUSCULAR | Status: DC | PRN

## 2013-01-10 MED ORDER — SODIUM CHLORIDE 0.9 % IV SOLN: INTRAVENOUS | Status: AC

## 2013-01-10 MED ORDER — ENOXAPARIN SODIUM 40 MG/0.4ML ~~LOC~~ SOLN
40.0000 mg | SUBCUTANEOUS | Status: DC
  Administered 2013-01-11 (×2): 40 mg via SUBCUTANEOUS
  Filled 2013-01-10 (×3): qty 0.4

## 2013-01-10 MED ORDER — ADULT MULTIVITAMIN W/MINERALS CH
1.0000 | ORAL_TABLET | Freq: Every day | ORAL | Status: DC
  Administered 2013-01-11 – 2013-01-12 (×2): 1 via ORAL
  Filled 2013-01-10 (×2): qty 1

## 2013-01-10 MED ORDER — VITAMIN B-1 100 MG PO TABS
100.0000 mg | ORAL_TABLET | Freq: Every day | ORAL | Status: DC
  Administered 2013-01-11 – 2013-01-12 (×2): 100 mg via ORAL
  Filled 2013-01-10 (×2): qty 1

## 2013-01-10 MED ORDER — HYDROMORPHONE HCL PF 1 MG/ML IJ SOLN
1.0000 mg | INTRAMUSCULAR | Status: AC | PRN
  Administered 2013-01-11: 1 mg via INTRAVENOUS
  Filled 2013-01-10: qty 1

## 2013-01-10 MED ORDER — SODIUM CHLORIDE 0.9 % IV BOLUS (SEPSIS)
1000.0000 mL | Freq: Once | INTRAVENOUS | Status: AC
  Administered 2013-01-10: 1000 mL via INTRAVENOUS

## 2013-01-10 MED ORDER — IOHEXOL 300 MG/ML  SOLN
50.0000 mL | Freq: Once | INTRAMUSCULAR | Status: AC | PRN
  Administered 2013-01-10: 50 mL via ORAL

## 2013-01-10 MED ORDER — SODIUM CHLORIDE 0.9 % IV BOLUS (SEPSIS): 1000.0000 mL | Freq: Once | INTRAVENOUS | Status: DC

## 2013-01-10 MED ORDER — ONDANSETRON HCL 4 MG PO TABS: 4.0000 mg | ORAL_TABLET | Freq: Four times a day (QID) | ORAL | Status: DC | PRN

## 2013-01-10 NOTE — ED Provider Notes (Signed)
Medical screening examination/treatment/procedure(s) were performed by non-physician practitioner and as supervising physician I was immediately available for consultation/collaboration.   Loren Racer, MD 01/10/13 2151

## 2013-01-10 NOTE — ED Notes (Signed)
Pt reports lower abdominal pain and back pain for past year. States that she has had several tests to determine the cause for the pain with no success. Pt reports lsoing 20lbs in last 3 weeks secondary to nausea and vomiting.

## 2013-01-10 NOTE — ED Notes (Signed)
Unable to obtain blood

## 2013-01-10 NOTE — ED Notes (Signed)
Pt back from CT

## 2013-01-10 NOTE — MAU Provider Note (Signed)
History     CSN: [id number]  Arrival date and time: 01/10/13 1159   First Provider Initiated Contact with Patient 01/10/13 1336      Chief Complaint  Patient presents with  . Emesis  . Abdominal Pain   HPI [name] is a 52 y.o. G1P1 who presents to MAU today with complaint of abdominal pain, N/V/D for > 1 year. The patient states a history of GI issues and has seen GI in [place]. The patient states that she was at [facility] 2 weeks ago and had work-up for pain that she was told was negative. Patient rates pain at 10/10 now. She takes Aspirin for pain, last dose was earlier this morning. She denies fever or UTI symptoms. She has a history of diverticulitis. Patient has had complete hysterectomy.   OB History   Grav Para Term Preterm Abortions TAB SAB Ect Mult Living   1         1      Past Medical History  Diagnosis Date  . Hypertension   . Hypothyroidism   . Chronic kidney disease     stones  . GERD (gastroesophageal reflux disease)   . Arthritis     hands  . Diverticulitis     Past Surgical History  Procedure Laterality Date  . Abdominal hysterectomy    . Cholecystectomy    . Appendectomy    . Esophagogastroduodenoscopy  01/07/2012    Procedure: ESOPHAGOGASTRODUODENOSCOPY (EGD);  Surgeon: Vertell Novak., MD;  Location: [facility] ENDOSCOPY;  Service: Endoscopy;  Laterality: N/A;  . Esophagogastroduodenoscopy  01/07/2012    Procedure: ESOPHAGOGASTRODUODENOSCOPY (EGD);  Surgeon: Vertell Novak., MD;  Location: [facility] ENDOSCOPY;  Service: Endoscopy;  Laterality: N/A;  . Colonoscopy  01/09/2012    Procedure: COLONOSCOPY;  Surgeon: Shirley Friar, MD;  Location: [facility] ENDOSCOPY;  Service: Endoscopy;  Laterality: N/A;    History reviewed. No pertinent family history.  History  Substance Use Topics  . Smoking status: Current Every Day Smoker -- 0.50 packs/day    Types: Cigarettes  . Smokeless tobacco: Not on file  . Alcohol Use: 16.8 oz/week     28 Shots of liquor per week    Allergies: No Known Allergies  Prescriptions prior to admission  Medication Sig Dispense Refill  . Cholecalciferol (VITAMIN D) 2000 UNITS tablet Take 2,000 Units by mouth daily.      . hydrochlorothiazide (HYDRODIURIL) 25 MG tablet Take 25 mg by mouth daily.      Marland Kitchen ibuprofen (ADVIL,MOTRIN) 200 MG tablet Take 200 mg by mouth every 6 (six) hours as needed for pain.        Review of Systems  Constitutional: Positive for malaise/fatigue. Negative for fever.  Gastrointestinal: Positive for nausea, vomiting, abdominal pain and diarrhea. Negative for constipation.  Genitourinary: Negative for dysuria, urgency and frequency.   Physical Exam   Blood pressure 109/78, pulse 108, temperature 98.2 F (36.8 C), temperature source Oral, resp. rate 18, height 4\' 9"  (1.448 m), weight 103 lb (46.72 kg).  Physical Exam  Constitutional: She is oriented to person, place, and time. She appears well-developed and well-nourished. No distress.  HENT:  Head: Normocephalic and atraumatic.  Cardiovascular: Normal rate, regular rhythm and normal heart sounds.   Respiratory: Effort normal and breath sounds normal. No respiratory distress.  GI: Soft. Bowel sounds are normal. She exhibits no distension and no mass. There is tenderness (moderate tenderness to palpation of the lower abdomen more prominent LLQ). There  is no rebound and no guarding.  Neurological: She is alert and oriented to person, place, and time.  Skin: Skin is warm and dry. No erythema.  Psychiatric: She has a normal mood and affect.   Results for orders placed during the hospital encounter of 01/10/13 (from the past 24 hour(s))  URINALYSIS, ROUTINE W REFLEX MICROSCOPIC     Status: Abnormal   Collection Time    01/10/13  1:35 PM      Result Value Range   Color, Urine YELLOW  YELLOW   APPearance CLEAR  CLEAR   Specific Gravity, Urine 1.015  1.005 - 1.030   pH 6.0  5.0 - 8.0   Glucose, UA NEGATIVE   NEGATIVE mg/dL   Hgb urine dipstick TRACE (*) NEGATIVE   Bilirubin Urine NEGATIVE  NEGATIVE   Ketones, ur NEGATIVE  NEGATIVE mg/dL   Protein, ur NEGATIVE  NEGATIVE mg/dL   Urobilinogen, UA 0.2  0.0 - 1.0 mg/dL   Nitrite NEGATIVE  NEGATIVE   Leukocytes, UA NEGATIVE  NEGATIVE  URINE MICROSCOPIC-ADD ON     Status: Abnormal   Collection Time    01/10/13  1:35 PM      Result Value Range   Squamous Epithelial / LPF FEW (*) RARE   WBC, UA 3-6  <3 WBC/hpf   Bacteria, UA RARE  RARE   Casts HYALINE CASTS (*) NEGATIVE  CBC     Status: Abnormal   Collection Time    01/10/13  2:10 PM      Result Value Range   WBC 12.6 (*) 4.0 - 10.5 K/uL   RBC 4.67  3.87 - 5.11 MIL/uL   Hemoglobin 16.6 (*) 12.0 - 15.0 g/dL   HCT 29.5 (*) 62.1 - 30.8 %   MCV 102.6 (*) 78.0 - 100.0 fL   MCH 35.5 (*) 26.0 - 34.0 pg   MCHC 34.7  30.0 - 36.0 g/dL   RDW 65.7  84.6 - 96.2 %   Platelets 258  150 - 400 K/uL     MAU Course  Procedures None  MDM UA and CBC today Patient given 1 mg Dilaudid in MAU today Patient is stable here today. This is a chronic issue that has had recent work-up at [facility] regional ED. I have reviewed some of the lab results supplied by the patient and most are normal.  Assessment and Plan  A: LLQ abdominal pain, chronic  P: Discharge home Patient advised to follow-up with GI or go to [facility] or [facility] for further evaluation if she feels this is emergent and cannot get in to see GI Patient may return to MAU as needed, although we have discussed that her condition would most likely be best evaluated at [facility] or [facility] in the future as she has had surgical removal of her uterus and ovaries.   Freddi Starr, PA-C  01/10/2013, 2:49 PM

## 2013-01-10 NOTE — Progress Notes (Signed)
Pt does not want to sign up for My Chart at current time. Briscoe Burns BSN, RN-BC Admissions RN  01/10/2013 9:15 PM

## 2013-01-10 NOTE — ED Notes (Signed)
Patient transported to CT 

## 2013-01-10 NOTE — MAU Note (Signed)
Been throwing up, pain in lower abd , these have been going on for 6-38months.  Was at [facility] 2 wks ago, could not find cause.  Pain is like labor.

## 2013-01-10 NOTE — MAU Note (Signed)
No uterus or ovaries.

## 2013-01-10 NOTE — ED Notes (Signed)
Pt reports having 10/10 lower pelvic/abdominal pain. Pt reports she was seen earlier today at [facility] hospital who referred her to [facility]. Pt denies vaginal bleeding or discharge. Pt states this pain has been occuring since last year. Pt reports not being able to eat or drink for two days due to nausea and pain. Pt states during her last hospital admission there was no diagnosis given. Pt reports loosing 20 pounds over the past four weeks.

## 2013-01-10 NOTE — H&P (Addendum)
Hospitalist Admission History and Physical  Patient name: [name] Medical record number: [id number] Date of birth: [birth year] Age: 52 y.o. Gender: female  Primary Care Provider: Crawford Givens, MD GI: [facility], [facility]   Chief Complaint: Dehydration, chronic abdominal pain History of Present Illness:This is a 52 y.o. year old female with significant past medical history of GI bleed, alcohol abuse, chronic abdominal pain presenting with abdominal pain flare and dehydration. Patient states that she's had chronic abdominal pain over the past 6 months with associated nausea and diarrhea. Patient states over the past 2 weeks she's had mildly worsening abdominal pain with markedly worsening nausea and diarrhea. Patient states she's not been able to hold down any foods. Patient states she's also 20 pounds over the past 2 weeks. Patient denies any fevers or chills. Vomiting is non-bloody. Has been bilious x1 day. Patient is also has some diarrhea. Patient has noticed a small amount of blood with wiping in the setting of history of hemorrhoids. No frank blood or black tarry stools in comparison to her previous GI bleed. Pain is predominantly in the lower abdominal area. Pain is greater than 10/10. No radiation. Patient has been seen by gastroenterology as well as gynecology for issues. Has had multiple imaging and diagnostic studies with no formal diagnosis. Patient does report a prior heavy drinking history. Patient states she has not drank in the past 6 months. Abdominal pain began around the time of patient quitting drinking. Patient denies any anxiety or irritation In the ER, patient is noted to have a creatinine of 1.8 as well as a white blood cell count of 12.6. A CT of the abdomen and pelvis was performed that was negative for any acute intra-abdominal abnormality. Some colonic diverticulosis was noted but no evidence of diverticulitis. Patient was also seen at [facility] hospital for symptoms  earlier in the day. Patient status post hysterectomy. Patient is also had multiple Donnell surgeries including cholecystectomy appendectomy and GYN surgery x2.  Patient Active Problem List   Diagnosis Date Noted  . GIB (gastrointestinal bleeding) 01/07/2012  . Hypokalemia 01/07/2012  . Anemia due to blood loss, acute 01/07/2012  . Leukocytosis 01/07/2012  . Alcohol abuse 01/07/2012  . Fracture, rib 01/10/2011  . HYPOTHYROIDISM 03/03/2010  . ANXIETY 03/03/2010  . HYPERTENSION/ hypotension 03/03/2010  . BLOOD IN STOOL 03/03/2010  . CHEST PAIN, ATYPICAL, HX OF 03/03/2010   Past Medical History: Past Medical History  Diagnosis Date  . Hypertension   . Hypothyroidism   . Chronic kidney disease     stones  . GERD (gastroesophageal reflux disease)   . Arthritis     hands  . Diverticulitis     Past Surgical History: Past Surgical History  Procedure Laterality Date  . Abdominal hysterectomy    . Cholecystectomy    . Appendectomy    . Esophagogastroduodenoscopy  01/07/2012    Procedure: ESOPHAGOGASTRODUODENOSCOPY (EGD);  Surgeon: Vertell Novak., MD;  Location: [facility] ENDOSCOPY;  Service: Endoscopy;  Laterality: N/A;  . Esophagogastroduodenoscopy  01/07/2012    Procedure: ESOPHAGOGASTRODUODENOSCOPY (EGD);  Surgeon: Vertell Novak., MD;  Location: [facility] ENDOSCOPY;  Service: Endoscopy;  Laterality: N/A;  . Colonoscopy  01/09/2012    Procedure: COLONOSCOPY;  Surgeon: Shirley Friar, MD;  Location: [facility] ENDOSCOPY;  Service: Endoscopy;  Laterality: N/A;    Social History: History   Social History  . Marital Status: Single    Spouse Name: N/A    Number of Children: N/A  . Years of Education:  N/A   Social History Main Topics  . Smoking status: Current Every Day Smoker -- 0.25 packs/day for 20 years    Types: Cigarettes  . Smokeless tobacco: Never Used  . Alcohol Use: 16.8 oz/week    28 Shots of liquor per week     Comment: 01/10/13 - quit 6 to 7 months ago, prior heavy use  .  Drug Use: No  . Sexually Active: No   Other Topics Concern  . None   Social History Narrative  . None    Family History: History reviewed. No pertinent family history.  Allergies: No Known Allergies  Current Facility-Administered Medications  Medication Dose Route Frequency Provider Last Rate Last Dose  . 0.9 %  sodium chloride infusion   Intravenous STAT Nicole Pisciotta, PA-C      . 0.9 %  sodium chloride infusion   Intravenous Continuous Doree Albee, MD      . enoxaparin (LOVENOX) injection 40 mg  40 mg Subcutaneous Q24H Doree Albee, MD      . folic acid (FOLVITE) tablet 1 mg  1 mg Oral Daily Doree Albee, MD      . HYDROmorphone (DILAUDID) injection 1 mg  1 mg Intravenous Q4H PRN Nicole Pisciotta, PA-C      . multivitamin with minerals tablet 1 tablet  1 tablet Oral Daily Doree Albee, MD      . ondansetron [facility]) tablet 4 mg  4 mg Oral Q6H PRN Doree Albee, MD       Or  . ondansetron [facility]) injection 4 mg  4 mg Intravenous Q6H PRN Doree Albee, MD      . sodium chloride 0.9 % bolus 1,000 mL  1,000 mL Intravenous Once [employer], PA-C      . thiamine (VITAMIN B-1) tablet 100 mg  100 mg Oral Daily Doree Albee, MD       Current Outpatient Prescriptions  Medication Sig Dispense Refill  . Cholecalciferol (VITAMIN D) 2000 UNITS tablet Take 2,000 Units by mouth daily.      . hydrochlorothiazide (HYDRODIURIL) 25 MG tablet Take 25 mg by mouth daily.      Marland Kitchen ibuprofen (ADVIL,MOTRIN) 200 MG tablet Take 400 mg by mouth every 8 (eight) hours as needed for pain.        Review Of Systems: 12 point ROS negative except as noted above in HPI.  Physical Exam: Filed Vitals:   01/10/13 1628  BP: 102/66  Pulse: 87  Temp: 97.6 F (36.4 C)  Resp: 20    General: alert and underweight  HEENT: PERRLA, extra ocular movement intact and dry oral mucoas  Heart: S1, S2 normal, no murmur, rub or gallop, regular rate and rhythm Lungs: clear to auscultation, no wheezes or  rales and unlabored breathing Abdomen: Hypoactive bowel sounds, positive markedly. Umbilical and lower, tenderness palpation  Genitourinary: Noted external hemorrhoids. Nonthrombosed. No active bleeding. Extremities: extremities normal, atraumatic, no cyanosis or edema  Skin:no rashes Neurology: normal without focal findings  Labs and Imaging: Lab Results  Component Value Date/Time   NA 128* 01/10/2013  7:00 PM   K 4.8 01/10/2013  7:00 PM   CL 95* 01/10/2013  7:00 PM   CO2 15* 01/10/2013  7:00 PM   BUN 25* 01/10/2013  7:00 PM   CREATININE 1.81* 01/10/2013  7:00 PM   GLUCOSE 115* 01/10/2013  7:00 PM   Lab Results  Component Value Date   WBC 13.0* 01/10/2013   HGB 18.4* 01/10/2013   HCT 52.4* 01/10/2013  MCV 103.8* 01/10/2013   PLT 242 01/10/2013   Urinalysis    Component Value Date/Time   COLORURINE YELLOW 01/10/2013 1934   APPEARANCEUR CLOUDY* 01/10/2013 1934   LABSPEC 1.022 01/10/2013 1934   PHURINE 5.0 01/10/2013 1934   GLUCOSEU NEGATIVE 01/10/2013 1934   HGBUR NEGATIVE 01/10/2013 1934   BILIRUBINUR SMALL* 01/10/2013 1934   KETONESUR NEGATIVE 01/10/2013 1934   PROTEINUR NEGATIVE 01/10/2013 1934   UROBILINOGEN 0.2 01/10/2013 1934   NITRITE NEGATIVE 01/10/2013 1934   LEUKOCYTESUR TRACE* 01/10/2013 1934      Ct Abdomen Pelvis Wo Contrast  01/10/2013   *RADIOLOGY REPORT*  Clinical Data: Nausea/vomiting, abdominal pain, weight loss  CT ABDOMEN AND PELVIS WITHOUT CONTRAST  Technique:  Multidetector CT imaging of the abdomen and pelvis was performed following the standard protocol without intravenous contrast.  Comparison: None.  Findings: Lung bases are clear.  Unenhanced liver, spleen, pancreas, and adrenal glands are within normal limits.  Status post cholecystectomy.  No intrahepatic or extrahepatic ductal dilatation.  1.4 cm medial left upper pole renal cyst (series 2/image 22).  Left kidney is unremarkable.  No renal calculi or hydronephrosis.  No evidence of bowel obstruction.  Prior appendectomy.  Extensive colonic  diverticulosis, without associated inflammatory changes.  Atherosclerotic calcifications of the abdominal aorta and branch vessels.  No abdominopelvic ascites.  No suspicious abdominopelvic lymphadenopathy.  Status post hysterectomy.  No adnexal masses.  No ureteral or bladder calculi.  Bilateral calcified pelvic phleboliths.  Bladder is mildly thick-walled but underdistended.  Very mild degenerative changes at L3-4.  IMPRESSION: No evidence of bowel obstruction.  Prior appendectomy.  Extensive colonic diverticulosis, without evidence of diverticulitis.  No CT findings to account for the patient's abdominal pain.   Original Report Authenticated By: Charline Bills, M.D.     Assessment and Plan: KENOSHA DOSTER is a 52 y.o. year old female presenting with chronic abdominal pain flare and dehydration.   Dehydration: Noted acute kidney injury with creatinine 1.8. Will aggressively hydrate patient and reassess in the morning. Zofran as needed for nausea.  Abdominal pain: This is a chronic issue. Has had fairly extensive workup in the past. This followed by gastroenterology in [place]. No acute findings on CT today. Will hold off on starting antibiotics currently. Follow white count. Pain management in the interim as this is been a chronic issue. Reassess in the morning.  Hypertension: BP is lower limits of normal in the setting of dehydration. Hold oral antihypertensive. Continue to follow.  Leukocytosis: Likely reactive in setting of chronic vomiting.  Volume status is likely also contributing. Hold off antibiotics as patient is afebrile. Consider starting if white count persists despite hydration.  FEN/GI: Hyponatremia. Hypovolemic in etiology. Hydrate and reassess Advance diet as tolerated. Zofran for nausea.  Prophylaxis: Subcutaneous heparin. No active signs of bleeding. Hemoglobin stable. Disposition: Pending further evaluation Code Status: Full code       Doree Albee MD  Pager:  [phone]

## 2013-01-10 NOTE — ED Provider Notes (Signed)
CSN: [id number]     Arrival date & time 01/10/13  1513 History     First MD Initiated Contact with Patient 01/10/13 1846     Chief Complaint  Patient presents with  . Abdominal Pain   (Consider location/radiation/quality/duration/timing/severity/associated sxs/prior Treatment) HPI  [name] is a 52 y.o. female sent from [facility] hospital complaining of exacerbation of chronic abdominal pain. Patient rates her pain at 10 out of 10, described as twisting. she's had multiple episodes of nonbloody, nonbilious emesis with a 20 pound weight loss in the last month. Patient states she intermittently is able to tolerate by mouth. She denies fever she endorses constipation states she has not had a bowel movement in 3 days, however, she is passing flatus.  Denies  fever, chest pain, shortness of breath, abnormal vaginal discharge or change in urination, hematochezia or melena.  Patient's past medical history of diverticulitis had negative colonoscopy one year ago.    Past Medical History  Diagnosis Date  . Hypertension   . Hypothyroidism   . Chronic kidney disease     stones  . GERD (gastroesophageal reflux disease)   . Arthritis     hands  . Diverticulitis    Past Surgical History  Procedure Laterality Date  . Abdominal hysterectomy    . Cholecystectomy    . Appendectomy    . Esophagogastroduodenoscopy  01/07/2012    Procedure: ESOPHAGOGASTRODUODENOSCOPY (EGD);  Surgeon: Vertell Novak., MD;  Location: [facility] ENDOSCOPY;  Service: Endoscopy;  Laterality: N/A;  . Esophagogastroduodenoscopy  01/07/2012    Procedure: ESOPHAGOGASTRODUODENOSCOPY (EGD);  Surgeon: Vertell Novak., MD;  Location: [facility] ENDOSCOPY;  Service: Endoscopy;  Laterality: N/A;  . Colonoscopy  01/09/2012    Procedure: COLONOSCOPY;  Surgeon: Shirley Friar, MD;  Location: [facility] ENDOSCOPY;  Service: Endoscopy;  Laterality: N/A;   No family history on file. History  Substance Use Topics  . Smoking status: Current Every  Day Smoker -- 0.25 packs/day for 20 years    Types: Cigarettes  . Smokeless tobacco: Never Used  . Alcohol Use: 16.8 oz/week    28 Shots of liquor per week   OB History   Grav Para Term Preterm Abortions TAB SAB Ect Mult Living   1         1     Review of Systems 10 systems reviewed and found to be negative, except as noted in the HPI  Allergies  Review of patient's allergies indicates no known allergies.  Home Medications   Current Outpatient Rx  Name  Route  Sig  Dispense  Refill  . Cholecalciferol (VITAMIN D) 2000 UNITS tablet   Oral   Take 2,000 Units by mouth daily.         . hydrochlorothiazide (HYDRODIURIL) 25 MG tablet   Oral   Take 25 mg by mouth daily.         Marland Kitchen ibuprofen (ADVIL,MOTRIN) 200 MG tablet   Oral   Take 400 mg by mouth every 8 (eight) hours as needed for pain.           BP 102/66  Pulse 87  Temp(Src) 97.6 F (36.4 C) (Oral)  Resp 20  SpO2 94% Physical Exam  Nursing note and vitals reviewed. Constitutional: She is oriented to person, place, and time. She appears well-developed and well-nourished.  Tearful  HENT:  Head: Normocephalic.  Mouth/Throat: Oropharynx is clear and moist.  Eyes: Conjunctivae and EOM are normal. Pupils are equal, round, and reactive to light.  Cardiovascular: Normal rate, regular rhythm and intact distal pulses.   Pulmonary/Chest: Effort normal and breath sounds normal. No stridor. No respiratory distress. She has no wheezes. She has no rales. She exhibits no tenderness.  Abdominal: Soft. Bowel sounds are normal. She exhibits no distension and no mass. There is tenderness. There is no rebound and no guarding.  To palpation left lower quadrant with no guarding or rebound  Musculoskeletal: Normal range of motion.  Neurological: She is alert and oriented to person, place, and time.  Psychiatric: She has a normal mood and affect.    ED Course   Procedures (including critical care time)  Labs Reviewed  CBC WITH  DIFFERENTIAL - Abnormal; Notable for the following:    WBC 13.0 (*)    Hemoglobin 18.4 (*)    HCT 52.4 (*)    MCV 103.8 (*)    MCH 36.4 (*)    Neutrophils Relative % 78 (*)    Neutro Abs 10.2 (*)    All other components within normal limits  COMPREHENSIVE METABOLIC PANEL - Abnormal; Notable for the following:    Sodium 128 (*)    Chloride 95 (*)    CO2 15 (*)    Glucose, Bld 115 (*)    BUN 25 (*)    Creatinine, Ser 1.81 (*)    GFR calc non Af Amer 32 (*)    GFR calc Af Amer 37 (*)    All other components within normal limits  URINALYSIS, ROUTINE W REFLEX MICROSCOPIC - Abnormal; Notable for the following:    APPearance CLOUDY (*)    Bilirubin Urine SMALL (*)    Leukocytes, UA TRACE (*)    All other components within normal limits  URINE MICROSCOPIC-ADD ON - Abnormal; Notable for the following:    Casts HYALINE CASTS (*)    All other components within normal limits  LIPASE, BLOOD   Ct Abdomen Pelvis Wo Contrast  01/10/2013   *RADIOLOGY REPORT*  Clinical Data: Nausea/vomiting, abdominal pain, weight loss  CT ABDOMEN AND PELVIS WITHOUT CONTRAST  Technique:  Multidetector CT imaging of the abdomen and pelvis was performed following the standard protocol without intravenous contrast.  Comparison: None.  Findings: Lung bases are clear.  Unenhanced liver, spleen, pancreas, and adrenal glands are within normal limits.  Status post cholecystectomy.  No intrahepatic or extrahepatic ductal dilatation.  1.4 cm medial left upper pole renal cyst (series 2/image 22).  Left kidney is unremarkable.  No renal calculi or hydronephrosis.  No evidence of bowel obstruction.  Prior appendectomy.  Extensive colonic diverticulosis, without associated inflammatory changes.  Atherosclerotic calcifications of the abdominal aorta and branch vessels.  No abdominopelvic ascites.  No suspicious abdominopelvic lymphadenopathy.  Status post hysterectomy.  No adnexal masses.  No ureteral or bladder calculi.  Bilateral  calcified pelvic phleboliths.  Bladder is mildly thick-walled but underdistended.  Very mild degenerative changes at L3-4.  IMPRESSION: No evidence of bowel obstruction.  Prior appendectomy.  Extensive colonic diverticulosis, without evidence of diverticulitis.  No CT findings to account for the patient's abdominal pain.   Original Report Authenticated By: Charline Bills, M.D.   1. AKI (acute kidney injury)   2. Emesis   3. Chronic abdominal pain   4. Diverticulosis large intestine w/o perforation or abscess w/bleeding     MDM   Filed Vitals:   01/10/13 1628  BP: 102/66  Pulse: 87  Temp: 97.6 F (36.4 C)  TempSrc: Oral  Resp: 20  SpO2: 94%  [name] is a 52 y.o. female planing of exacerbation of chronic abdominal pain to left lower quadrant with nausea vomiting, constipation and 20 pound weight loss in the last month. Abdominal exam is nonsurgical. Patient is a mild leukocytosis and appears hemoconcentrated on CBC. Electrolytes show mild hyponatremia at 128 and acute injury with creatinine of 1.81 increasing from normal 48 hours.   Patient will be admitted to [facility] Dr. Alvester Morin for hydration recheck of kidney function  Medications  sodium chloride 0.9 % bolus 1,000 mL (not administered)  sodium chloride 0.9 % bolus 1,000 mL (1,000 mLs Intravenous New Bag/Given 01/10/13 2003)  ondansetron (ZOFRAN) injection 4 mg (4 mg Intravenous Given 01/10/13 2002)  iohexol (OMNIPAQUE) 300 MG/ML solution 50 mL (50 mLs Oral Contrast Given 01/10/13 2011)  ketorolac (TORADOL) 15 MG/ML injection 15 mg (15 mg Intravenous Given 01/10/13 2032)  morphine 4 MG/ML injection 4 mg (4 mg Intravenous Given 01/10/13 2047)   Note: Portions of this report may have been transcribed using voice recognition software. Every effort was made to ensure accuracy; however, inadvertent computerized transcription errors may be present     Wynetta Emery, PA-C 01/10/13 2051

## 2013-01-11 DIAGNOSIS — N179 Acute kidney failure, unspecified: Secondary | ICD-10-CM | POA: Diagnosis present

## 2013-01-11 DIAGNOSIS — R1110 Vomiting, unspecified: Secondary | ICD-10-CM

## 2013-01-11 DIAGNOSIS — F1010 Alcohol abuse, uncomplicated: Secondary | ICD-10-CM

## 2013-01-11 LAB — BASIC METABOLIC PANEL
BUN: 17 mg/dL (ref 6–23)
GFR calc Af Amer: 59 mL/min — ABNORMAL LOW (ref >90)
GFR calc non Af Amer: 51 mL/min — ABNORMAL LOW (ref >90)
Potassium: 4.4 mEq/L (ref 3.5–5.1)
Sodium: 137 mEq/L (ref 135–145)

## 2013-01-11 LAB — ETHANOL: Alcohol, Ethyl (B): <11 mg/dL (ref 0–11)

## 2013-01-11 MED ORDER — SODIUM CHLORIDE 0.9 % IV BOLUS (SEPSIS)
1000.0000 mL | Freq: Once | INTRAVENOUS | Status: AC
  Administered 2013-01-11: 1000 mL via INTRAVENOUS

## 2013-01-11 MED ORDER — ZOLPIDEM TARTRATE 5 MG PO TABS
5.0000 mg | ORAL_TABLET | Freq: Every evening | ORAL | Status: DC | PRN
  Administered 2013-01-11: 5 mg via ORAL
  Filled 2013-01-11: qty 1

## 2013-01-11 MED ORDER — PANTOPRAZOLE SODIUM 40 MG PO TBEC
40.0000 mg | DELAYED_RELEASE_TABLET | Freq: Two times a day (BID) | ORAL | Status: DC
  Administered 2013-01-11 – 2013-01-12 (×3): 40 mg via ORAL
  Filled 2013-01-11 (×4): qty 1

## 2013-01-11 MED ORDER — HYDROCODONE-ACETAMINOPHEN 7.5-325 MG PO TABS
1.0000 | ORAL_TABLET | Freq: Four times a day (QID) | ORAL | Status: DC | PRN
  Administered 2013-01-11: 1 via ORAL
  Filled 2013-01-11: qty 1

## 2013-01-11 NOTE — Progress Notes (Addendum)
[facility] PROGRESS NOTE Interim History: 52 y.o. year old female with significant past medical history of GI bleed, alcohol abuse, chronic abdominal pain presenting with abdominal pain flare and dehydration. Patient states that she's had chronic abdominal pain over the past 6 months with associated nausea and diarrhea.Patient states she's not been able to hold down any foods.  20 pounds over the past 2 weeks. Patient denies any fevers or chills. Vomiting is non-bloody. Has been bilious x1 day. Patient is also has some diarrhea. . No frank blood or black tarry stools in comparison to her previous GI bleed. Pain is predominantly in the lower abdominal area. Pain is greater than 10/10. No radiation.  Patient has been seen by gastroenterology as well as gynecology for issues. Has had multiple imaging and diagnostic studies with no formal diagnosis. Patient does report a prior heavy drinking history. Patient states she has not drank in the past 6 months. Abdominal pain began around the time of patient quitting drinking. Patient denies any anxiety or irritation    Assessment/Plan: AKI (acute kidney injury) - Improved with IV fluids. - CT Abd & pelvis no acute pathology.  Leukocytosis - resolved. Due to AKI. - afebrile.  Chronic abdominal pain: - has had ongoing abdominal pain for over 6 months extensive w/u as an outpatinet. Protonix, monitor Hbg. - no melena. - ct abd no acute finding. - follow up with GI.  Alcohol abuse - no sign of withdrawal.  HYPERTENSION: - monitor.   Code Status: full Family Communication: none  Disposition Plan: inpatient   Consultants:  none  Procedures:  Ct abdomen and pelvis  Antibiotics:  None  HPI/Subjective: Tolerate liq diet Some mild abdominal pain.  Objective: Filed Vitals:   01/10/13 1628 01/10/13 2222 01/10/13 2300 01/11/13 0532  BP: 102/66  131/83 131/82  Pulse: 87  80 80  Temp: 97.6 F (36.4 C)  97.7 F (36.5 C) 97.4 F  (36.3 C)  TempSrc: Oral  Oral Oral  Resp: 20  18 18   Height:  4\' 9"  (1.448 m)    Weight:  46.72 kg (103 lb) 47.2 kg (104 lb 0.9 oz) 47.4 kg (104 lb 8 oz)  SpO2: 94%  96% 100%    Intake/Output Summary (Last 24 hours) at 01/11/13 1014 Last data filed at 01/11/13 0600  Gross per 24 hour  Intake   1135 ml  Output      0 ml  Net   1135 ml   Filed Weights   01/10/13 2222 01/10/13 2300 01/11/13 0532  Weight: 46.72 kg (103 lb) 47.2 kg (104 lb 0.9 oz) 47.4 kg (104 lb 8 oz)    Exam:  General: Alert, awake, oriented x3, in no acute distress.  HEENT: No bruits, no goiter.  Heart: Regular rate and rhythm, without murmurs, rubs, gallops.  Lungs: Good air movement, bilateral air movement.  Abdomen: Soft, nontender, nondistended, positive bowel sounds.  Neuro: Grossly intact, nonfocal.   Data Reviewed: Basic Metabolic Panel:  Recent Labs Lab 01/10/13 1900 01/10/13 2205 01/11/13 0845  NA 128*  --  137  K 4.8  --  4.4  CL 95*  --  109  CO2 15*  --  20  GLUCOSE 115*  --  97  BUN 25*  --  17  CREATININE 1.81* 1.61* 1.22*  CALCIUM 10.0  --  8.6   Liver Function Tests:  Recent Labs Lab 01/10/13 1900  AST 19  ALT 13  ALKPHOS 101  BILITOT 0.3  PROT 7.6  ALBUMIN 4.0    Recent Labs Lab 01/10/13 1900  LIPASE 38    Recent Labs Lab 01/10/13 2318  AMMONIA 21   CBC:  Recent Labs Lab 01/10/13 1410 01/10/13 1900 01/10/13 2205  WBC 12.6* 13.0* 10.4  NEUTROABS  --  10.2*  --   HGB 16.6* 18.4* 15.7*  HCT 47.9* 52.4* 46.0  MCV 102.6* 103.8* 104.5*  PLT 258 242 241   Cardiac Enzymes: No results found for this basename: CKTOTAL, CKMB, CKMBINDEX, TROPONINI,  in the last 168 hours BNP (last 3 results) No results found for this basename: PROBNP,  in the last 8760 hours CBG: No results found for this basename: GLUCAP,  in the last 168 hours  No results found for this or any previous visit (from the past 240 hour(s)).   Studies: Ct Abdomen Pelvis Wo  Contrast  01/10/2013   *RADIOLOGY REPORT*  Clinical Data: Nausea/vomiting, abdominal pain, weight loss  CT ABDOMEN AND PELVIS WITHOUT CONTRAST  Technique:  Multidetector CT imaging of the abdomen and pelvis was performed following the standard protocol without intravenous contrast.  Comparison: None.  Findings: Lung bases are clear.  Unenhanced liver, spleen, pancreas, and adrenal glands are within normal limits.  Status post cholecystectomy.  No intrahepatic or extrahepatic ductal dilatation.  1.4 cm medial left upper pole renal cyst (series 2/image 22).  Left kidney is unremarkable.  No renal calculi or hydronephrosis.  No evidence of bowel obstruction.  Prior appendectomy.  Extensive colonic diverticulosis, without associated inflammatory changes.  Atherosclerotic calcifications of the abdominal aorta and branch vessels.  No abdominopelvic ascites.  No suspicious abdominopelvic lymphadenopathy.  Status post hysterectomy.  No adnexal masses.  No ureteral or bladder calculi.  Bilateral calcified pelvic phleboliths.  Bladder is mildly thick-walled but underdistended.  Very mild degenerative changes at L3-4.  IMPRESSION: No evidence of bowel obstruction.  Prior appendectomy.  Extensive colonic diverticulosis, without evidence of diverticulitis.  No CT findings to account for the patient's abdominal pain.   Original Report Authenticated By: Charline Bills, M.D.    Scheduled Meds: . sodium chloride   Intravenous STAT  . enoxaparin (LOVENOX) injection  40 mg Subcutaneous Q24H  . folic acid  1 mg Oral Daily  . multivitamin with minerals  1 tablet Oral Daily  . sodium chloride  1,000 mL Intravenous Once  . thiamine  100 mg Oral Daily   Continuous Infusions: . sodium chloride 150 mL/hr at 01/10/13 2226     Marinda Elk  [facility] Pager [phone]. If 8PM-8AM, please contact night-coverage at [email or web address], password [facility] 01/11/2013, 10:14 AM  LOS: 1 day

## 2013-01-11 NOTE — Progress Notes (Signed)
INITIAL NUTRITION ASSESSMENT  Pt meets criteria for severe MALNUTRITION in the context of chronic illness as evidenced by <50% estimated energy intake with 16% weight loss in the past month per pt report.  DOCUMENTATION CODES Per approved criteria  -Severe malnutrition in the context of chronic illness   INTERVENTION: - Anti-emetics per MD - Multivitamin 1 tablet PO daily - Recommend MD monitor pt's magnesium, potassium, and phosphorus as PO intake improves (daily, for at least 3 days) and replete as needed as pt at risk of refeeding syndrome r/t 2 weeks of pt being basically NPO - Will continue to monitor   NUTRITION DIAGNOSIS: Altered GI function related to nausea, 2 weeks of vomiting, as evidenced by pt report.   Goal: 1. Resolution of nausea, vomiting, and abdominal pain 2. Pt to consume >90% of meals/supplements   Monitor:  Weights, labs, intake, nausea/vomiting/abdominal pain  Reason for Assessment: Nutrition risk   52 y.o. female  Admitting Dx: AKI (acute kidney injury)  ASSESSMENT: Pt with history of GI bleed, alcohol abuse, chronic abdominal pain presenting with abdominal pain flare and dehydration. Patient states that she's had chronic abdominal pain over the past 6 months with associated nausea and diarrhea. Patient states over the past 2 weeks she's had mildly worsening abdominal pain with markedly worsening nausea and diarrhea. Patient states she's not been able to hold down any foods. Patient states she's also 20 pounds over the past 2 weeks. Pt reports emesis occurred 4-5 times/day and was green. States the only intake at home consisted of a few bites of macaroni and cheese/day. Pt c/o still having abdominal pain but states the nausea is better today. Pt tearful during visit about being sick and not able to eat for so long, emotional support provided.    Height: Ht Readings from Last 1 Encounters:  01/10/13 4\' 9"  (1.448 m)    Weight: Wt Readings from Last 1  Encounters:  01/11/13 104 lb 8 oz (47.4 kg)    Ideal Body Weight: 95 lb  % Ideal Body Weight: 109%  Wt Readings from Last 10 Encounters:  01/11/13 104 lb 8 oz (47.4 kg)  01/10/13 103 lb (46.72 kg)  01/07/12 107 lb 9.4 oz (48.8 kg)  01/07/12 107 lb 9.4 oz (48.8 kg)  01/07/12 107 lb 9.4 oz (48.8 kg)  01/07/12 107 lb 9.4 oz (48.8 kg)  03/03/10 120 lb 12 oz (54.772 kg)    Usual Body Weight: 124 lb per pt  % Usual Body Weight: 84%  BMI:  Body mass index is 22.61 kg/(m^2).  Estimated Nutritional Needs: Kcal: 1200-1400 Protein: 50-60g Fluid: 1.4 L/day  Skin: Intact   Diet Order: General  EDUCATION NEEDS: -No education needs identified at this time   Intake/Output Summary (Last 24 hours) at 01/11/13 1129 Last data filed at 01/11/13 0600  Gross per 24 hour  Intake   1135 ml  Output      0 ml  Net   1135 ml    Last BM: PTA  Labs:   Recent Labs Lab 01/10/13 1900 01/10/13 2205 01/11/13 0845  NA 128*  --  137  K 4.8  --  4.4  CL 95*  --  109  CO2 15*  --  20  BUN 25*  --  17  CREATININE 1.81* 1.61* 1.22*  CALCIUM 10.0  --  8.6  GLUCOSE 115*  --  97    CBG (last 3)  No results found for this basename: GLUCAP,  in the last  72 hours  Scheduled Meds: . sodium chloride   Intravenous STAT  . enoxaparin (LOVENOX) injection  40 mg Subcutaneous Q24H  . folic acid  1 mg Oral Daily  . multivitamin with minerals  1 tablet Oral Daily  . pantoprazole  40 mg Oral BID  . sodium chloride  1,000 mL Intravenous Once  . thiamine  100 mg Oral Daily    Continuous Infusions: . sodium chloride 150 mL/hr at 01/10/13 2226    Past Medical History  Diagnosis Date  . Hypertension   . Hypothyroidism   . Chronic kidney disease     stones  . GERD (gastroesophageal reflux disease)   . Arthritis     hands  . Diverticulitis     Past Surgical History  Procedure Laterality Date  . Abdominal hysterectomy    . Cholecystectomy    . Appendectomy    .  Esophagogastroduodenoscopy  01/07/2012    Procedure: ESOPHAGOGASTRODUODENOSCOPY (EGD);  Surgeon: Vertell Novak., MD;  Location: [facility] ENDOSCOPY;  Service: Endoscopy;  Laterality: N/A;  . Esophagogastroduodenoscopy  01/07/2012    Procedure: ESOPHAGOGASTRODUODENOSCOPY (EGD);  Surgeon: Vertell Novak., MD;  Location: [facility] ENDOSCOPY;  Service: Endoscopy;  Laterality: N/A;  . Colonoscopy  01/09/2012    Procedure: COLONOSCOPY;  Surgeon: Shirley Friar, MD;  Location: [facility] ENDOSCOPY;  Service: Endoscopy;  Laterality: N/A;     Levon Hedger MS, RD, LDN [phone] Pager [phone] After Hours Pager

## 2013-01-12 DIAGNOSIS — R109 Unspecified abdominal pain: Secondary | ICD-10-CM

## 2013-01-12 DIAGNOSIS — E43 Unspecified severe protein-calorie malnutrition: Secondary | ICD-10-CM | POA: Insufficient documentation

## 2013-01-12 DIAGNOSIS — G8929 Other chronic pain: Secondary | ICD-10-CM

## 2013-01-12 LAB — BASIC METABOLIC PANEL
Chloride: 111 mEq/L (ref 96–112)
Creatinine, Ser: 1.15 mg/dL — ABNORMAL HIGH (ref 0.50–1.10)
GFR calc Af Amer: 63 mL/min — ABNORMAL LOW (ref >90)

## 2013-01-12 LAB — CBC
HCT: 38.5 % (ref 36.0–46.0)
Platelets: 212 10*3/uL (ref 150–400)
RDW: 12.5 % (ref 11.5–15.5)
WBC: 6.2 10*3/uL (ref 4.0–10.5)

## 2013-01-12 LAB — URINE CULTURE
Colony Count: NO GROWTH
Culture: NO GROWTH

## 2013-01-12 MED ORDER — IBUPROFEN 200 MG PO TABS: 400.0000 mg | ORAL_TABLET | Freq: Three times a day (TID) | ORAL | Status: DC | PRN

## 2013-01-12 MED ORDER — HYDROCHLOROTHIAZIDE 25 MG PO TABS: 25.0000 mg | ORAL_TABLET | Freq: Every day | ORAL | Status: DC

## 2013-01-12 NOTE — Discharge Summary (Signed)
Physician Discharge Summary  [name] ZOX:[id number] DOB: 02-28-1961 DOA: 01/10/2013  PCP: Crawford Givens, MD  Admit date: 01/10/2013 Discharge date: 01/12/2013  Time spent: 35 minutes  Recommendations for Outpatient Follow-up:  1. Follow up with PCP and cehck a b-met in 1 week. 2. Follow up with Gi.  Discharge Diagnoses:  Principal Problem:   AKI (acute kidney injury) Active Problems:   HYPERTENSION/ hypotension   Leukocytosis   Alcohol abuse   Protein-calorie malnutrition, severe   Discharge Condition: stable  Diet recommendation: regulat  Filed Weights   01/10/13 2222 01/10/13 2300 01/11/13 0532  Weight: 46.72 kg (103 lb) 47.2 kg (104 lb 0.9 oz) 47.4 kg (104 lb 8 oz)    History of present illness:  52 y.o. year old female with significant past medical history of GI bleed, alcohol abuse, chronic abdominal pain presenting with abdominal pain flare and dehydration. Patient states that she's had chronic abdominal pain over the past 6 months with associated nausea and diarrhea. Patient states over the past 2 weeks she's had mildly worsening abdominal pain with markedly worsening nausea and diarrhea. Patient states she's not been able to hold down any foods. Patient states she's also 20 pounds over the past 2 weeks. Patient denies any fevers or chills. Vomiting is non-bloody. Has been bilious x1 day. Patient is also has some diarrhea. Patient has noticed a small amount of blood with wiping in the setting of history of hemorrhoids. No frank blood or black tarry stools in comparison to her previous GI bleed. Pain is predominantly in the lower abdominal area. Pain is greater than 10/10. No radiation.  Patient has been seen by gastroenterology as well as gynecology for issues. Has had multiple imaging and diagnostic studies with no formal diagnosis. Patient does report a prior heavy drinking history. Patient states she has not drank in the past 6 months. Abdominal pain began around the time  of patient quitting drinking. Patient denies any anxiety or irritation  In the ER, patient is noted to have a creatinine of 1.8 as well as a white blood cell count of 12.6. A CT of the abdomen and pelvis was performed that was negative for any acute intra-abdominal abnormality. Some colonic diverticulosis was noted but no evidence of diverticulitis. Patient was also seen at [facility] hospital for symptoms earlier in the day. Patient status post hysterectomy. Patient is also had multiple Donnell surgeries including cholecystectomy appendectomy and GYN surgery x2.   Hospital Course:  AKI (acute kidney injury)  - Improved with IV fluids.  - CT Abd & pelvis no acute pathology.  - resume HCTZ in 1 week.  Leukocytosis  - resolved. Due to AKI.  - afebrile.   Chronic abdominal pain:  - has had ongoing abdominal pain for over 6 months extensive w/u as an outpatinet. Protonix, monitor Hbg.  - no melena.  - ct abd no acute finding.  - follow up with GI.   Alcohol abuse  - no sign of withdrawal.   HYPERTENSION:  - monitor.    Procedures:  Ct abd and pelvis  Consultations:  none  Discharge Exam: Filed Vitals:   01/12/13 0600  BP: 155/75  Pulse: 84  Temp: 98.3 F (36.8 C)  Resp: 18    General: A&O x3 Cardiovascular: RRR Respiratory: good air movement CTA B/L  Discharge Instructions  Discharge Orders   Future Orders Complete By Expires     Diet - low sodium heart healthy  As directed     Increase  activity slowly  As directed         Medication List         hydrochlorothiazide 25 MG tablet  Commonly known as:  HYDRODIURIL  Take 1 tablet (25 mg total) by mouth daily.  Start taking on:  01/17/2013     ibuprofen 200 MG tablet  Commonly known as:  ADVIL,MOTRIN  Take 2 tablets (400 mg total) by mouth every 8 (eight) hours as needed for pain.  Start taking on:  01/18/2013     Vitamin D 2000 UNITS tablet  Take 2,000 Units by mouth daily.       No Known  Allergies    The results of significant diagnostics from this hospitalization (including imaging, microbiology, ancillary and laboratory) are listed below for reference.    Significant Diagnostic Studies: Ct Abdomen Pelvis Wo Contrast  01/10/2013   *RADIOLOGY REPORT*  Clinical Data: Nausea/vomiting, abdominal pain, weight loss  CT ABDOMEN AND PELVIS WITHOUT CONTRAST  Technique:  Multidetector CT imaging of the abdomen and pelvis was performed following the standard protocol without intravenous contrast.  Comparison: None.  Findings: Lung bases are clear.  Unenhanced liver, spleen, pancreas, and adrenal glands are within normal limits.  Status post cholecystectomy.  No intrahepatic or extrahepatic ductal dilatation.  1.4 cm medial left upper pole renal cyst (series 2/image 22).  Left kidney is unremarkable.  No renal calculi or hydronephrosis.  No evidence of bowel obstruction.  Prior appendectomy.  Extensive colonic diverticulosis, without associated inflammatory changes.  Atherosclerotic calcifications of the abdominal aorta and branch vessels.  No abdominopelvic ascites.  No suspicious abdominopelvic lymphadenopathy.  Status post hysterectomy.  No adnexal masses.  No ureteral or bladder calculi.  Bilateral calcified pelvic phleboliths.  Bladder is mildly thick-walled but underdistended.  Very mild degenerative changes at L3-4.  IMPRESSION: No evidence of bowel obstruction.  Prior appendectomy.  Extensive colonic diverticulosis, without evidence of diverticulitis.  No CT findings to account for the patient's abdominal pain.   Original Report Authenticated By: Charline Bills, M.D.    Microbiology: Recent Results (from the past 240 hour(s))  URINE CULTURE     Status: None   Collection Time    01/10/13  9:13 PM      Result Value Range Status   Specimen Description URINE, CLEAN CATCH   Final   Special Requests NONE   Final   Culture  Setup Time     Final   Value: 01/11/2013 02:59     Performed at  [facility] Count     Final   Value: NO GROWTH     Performed at [facility]   Culture     Final   Value: NO GROWTH     Performed at [facility]   Report Status 01/12/2013 FINAL   Final     Labs: Basic Metabolic Panel:  Recent Labs Lab 01/10/13 1900 01/10/13 2205 01/11/13 0845 01/12/13 0443  NA 128*  --  137 140  K 4.8  --  4.4 4.2  CL 95*  --  109 111  CO2 15*  --  20 22  GLUCOSE 115*  --  97 98  BUN 25*  --  17 13  CREATININE 1.81* 1.61* 1.22* 1.15*  CALCIUM 10.0  --  8.6 9.1   Liver Function Tests:  Recent Labs Lab 01/10/13 1900  AST 19  ALT 13  ALKPHOS 101  BILITOT 0.3  PROT 7.6  ALBUMIN 4.0  Recent Labs Lab 01/10/13 1900  LIPASE 38    Recent Labs Lab 01/10/13 2318  AMMONIA 21   CBC:  Recent Labs Lab 01/10/13 1410 01/10/13 1900 01/10/13 2205 01/12/13 0443  WBC 12.6* 13.0* 10.4 6.2  NEUTROABS  --  10.2*  --   --   HGB 16.6* 18.4* 15.7* 13.1  HCT 47.9* 52.4* 46.0 38.5  MCV 102.6* 103.8* 104.5* 102.9*  PLT 258 242 241 212   Cardiac Enzymes: No results found for this basename: CKTOTAL, CKMB, CKMBINDEX, TROPONINI,  in the last 168 hours BNP: BNP (last 3 results) No results found for this basename: PROBNP,  in the last 8760 hours CBG: No results found for this basename: GLUCAP,  in the last 168 hours     Signed:  Marinda Elk  [facility] 01/12/2013, 12:31 PM

## 2013-01-12 NOTE — Progress Notes (Signed)
Discharge instructions given to pt, verbalized understanding. Left the unit in stable condition. 

## 2013-03-28 LAB — DIFFERENTIAL
Lymphocyte #: 0.9 10*3/uL — ABNORMAL LOW (ref 1.0–3.6)
Monocyte %: 5.4 %
Neutrophil #: 22.3 10*3/uL — ABNORMAL HIGH (ref 1.4–6.5)
Neutrophil %: 89.7 %

## 2013-03-28 LAB — COMPREHENSIVE METABOLIC PANEL
Albumin: 3.4 g/dL (ref 3.4–5.0)
Anion Gap: 10 (ref 7–16)
Bilirubin,Total: 0.5 mg/dL (ref 0.2–1.0)
EGFR (Non-African Amer.): 20 — ABNORMAL LOW
Potassium: 4.6 mmol/L (ref 3.5–5.1)
SGOT(AST): 26 U/L (ref 15–37)
SGPT (ALT): 17 U/L (ref 12–78)
Sodium: 135 mmol/L — ABNORMAL LOW (ref 136–145)

## 2013-03-28 LAB — URINALYSIS, COMPLETE
Bacteria: NONE SEEN
Bilirubin,UR: NEGATIVE
Blood: NEGATIVE
Glucose,UR: NEGATIVE mg/dL (ref 0–75)
Nitrite: NEGATIVE
Protein: 30
RBC,UR: 3 /HPF (ref 0–5)
Specific Gravity: 1.021 (ref 1.003–1.030)
WBC UR: 12 /HPF (ref 0–5)

## 2013-03-28 LAB — CBC
MCHC: 34 g/dL (ref 32.0–36.0)
MCV: 100 fL (ref 80–100)
RBC: 4.32 10*6/uL (ref 3.80–5.20)
WBC: 24.8 10*3/uL — ABNORMAL HIGH (ref 3.6–11.0)

## 2013-03-29 ENCOUNTER — Inpatient Hospital Stay: Payer: Self-pay | Admitting: Internal Medicine

## 2013-03-30 LAB — BASIC METABOLIC PANEL
Anion Gap: 7 (ref 7–16)
BUN: 16 mg/dL (ref 7–18)
Calcium, Total: 8.3 mg/dL — ABNORMAL LOW (ref 8.5–10.1)
Creatinine: 1.57 mg/dL — ABNORMAL HIGH (ref 0.60–1.30)
EGFR (African American): 44 — ABNORMAL LOW
EGFR (Non-African Amer.): 38 — ABNORMAL LOW
Sodium: 147 mmol/L — ABNORMAL HIGH (ref 136–145)

## 2013-03-30 LAB — CBC WITH DIFFERENTIAL/PLATELET
Basophil #: 0.1 10*3/uL (ref 0.0–0.1)
Eosinophil #: 0.4 10*3/uL (ref 0.0–0.7)
Eosinophil %: 4.8 %
HCT: 31.5 % — ABNORMAL LOW (ref 35.0–47.0)
HGB: 11.1 g/dL — ABNORMAL LOW (ref 12.0–16.0)
Lymphocyte #: 1.8 10*3/uL (ref 1.0–3.6)
Lymphocyte %: 20.8 %
MCHC: 35.1 g/dL (ref 32.0–36.0)
Monocyte %: 8.2 %
Neutrophil #: 5.8 10*3/uL (ref 1.4–6.5)
Neutrophil %: 65.2 %
Platelet: 220 10*3/uL (ref 150–440)
RBC: 3.18 10*6/uL — ABNORMAL LOW (ref 3.80–5.20)
RDW: 13.3 % (ref 11.5–14.5)

## 2013-04-01 LAB — BASIC METABOLIC PANEL
Chloride: 117 mmol/L — ABNORMAL HIGH (ref 98–107)
Co2: 19 mmol/L — ABNORMAL LOW (ref 21–32)
Creatinine: 1.49 mg/dL — ABNORMAL HIGH (ref 0.60–1.30)
EGFR (African American): 47 — ABNORMAL LOW
EGFR (Non-African Amer.): 41 — ABNORMAL LOW
Glucose: 85 mg/dL (ref 65–99)
Potassium: 3.9 mmol/L (ref 3.5–5.1)
Sodium: 144 mmol/L (ref 136–145)

## 2013-04-02 LAB — CULTURE, BLOOD (SINGLE)

## 2013-07-16 ENCOUNTER — Emergency Department (HOSPITAL_COMMUNITY): Payer: BC Managed Care – PPO

## 2013-07-16 ENCOUNTER — Encounter (HOSPITAL_COMMUNITY): Payer: Self-pay | Admitting: Emergency Medicine

## 2013-07-16 ENCOUNTER — Inpatient Hospital Stay (HOSPITAL_COMMUNITY)
Admission: EM | Admit: 2013-07-16 | Discharge: 2013-07-20 | DRG: 371 | Disposition: A | Discharge status: Home / Self Care | Payer: BC Managed Care – PPO | Attending: Internal Medicine | Admitting: Internal Medicine

## 2013-07-16 DIAGNOSIS — E860 Dehydration: Secondary | ICD-10-CM | POA: Diagnosis present

## 2013-07-16 DIAGNOSIS — K5289 Other specified noninfective gastroenteritis and colitis: Secondary | ICD-10-CM

## 2013-07-16 DIAGNOSIS — N179 Acute kidney failure, unspecified: Secondary | ICD-10-CM

## 2013-07-16 DIAGNOSIS — F1010 Alcohol abuse, uncomplicated: Secondary | ICD-10-CM

## 2013-07-16 DIAGNOSIS — K219 Gastro-esophageal reflux disease without esophagitis: Secondary | ICD-10-CM | POA: Diagnosis present

## 2013-07-16 DIAGNOSIS — I129 Hypertensive chronic kidney disease with stage 1 through stage 4 chronic kidney disease, or unspecified chronic kidney disease: Secondary | ICD-10-CM | POA: Diagnosis present

## 2013-07-16 DIAGNOSIS — M19049 Primary osteoarthritis, unspecified hand: Secondary | ICD-10-CM | POA: Diagnosis present

## 2013-07-16 DIAGNOSIS — D72828 Other elevated white blood cell count: Secondary | ICD-10-CM

## 2013-07-16 DIAGNOSIS — Z9089 Acquired absence of other organs: Secondary | ICD-10-CM

## 2013-07-16 DIAGNOSIS — E43 Unspecified severe protein-calorie malnutrition: Secondary | ICD-10-CM

## 2013-07-16 DIAGNOSIS — I10 Essential (primary) hypertension: Secondary | ICD-10-CM

## 2013-07-16 DIAGNOSIS — F17200 Nicotine dependence, unspecified, uncomplicated: Secondary | ICD-10-CM | POA: Diagnosis present

## 2013-07-16 DIAGNOSIS — K5792 Diverticulitis of intestine, part unspecified, without perforation or abscess without bleeding: Secondary | ICD-10-CM

## 2013-07-16 DIAGNOSIS — N189 Chronic kidney disease, unspecified: Secondary | ICD-10-CM | POA: Diagnosis present

## 2013-07-16 DIAGNOSIS — A0472 Enterocolitis due to Clostridium difficile, not specified as recurrent: Secondary | ICD-10-CM

## 2013-07-16 DIAGNOSIS — D72829 Elevated white blood cell count, unspecified: Secondary | ICD-10-CM

## 2013-07-16 DIAGNOSIS — Z79899 Other long term (current) drug therapy: Secondary | ICD-10-CM

## 2013-07-16 DIAGNOSIS — K5732 Diverticulitis of large intestine without perforation or abscess without bleeding: Secondary | ICD-10-CM | POA: Diagnosis present

## 2013-07-16 DIAGNOSIS — G8929 Other chronic pain: Secondary | ICD-10-CM | POA: Diagnosis present

## 2013-07-16 DIAGNOSIS — K529 Noninfective gastroenteritis and colitis, unspecified: Secondary | ICD-10-CM

## 2013-07-16 DIAGNOSIS — R52 Pain, unspecified: Secondary | ICD-10-CM | POA: Diagnosis present

## 2013-07-16 DIAGNOSIS — IMO0002 Reserved for concepts with insufficient information to code with codable children: Secondary | ICD-10-CM

## 2013-07-16 DIAGNOSIS — N289 Disorder of kidney and ureter, unspecified: Secondary | ICD-10-CM

## 2013-07-16 DIAGNOSIS — E039 Hypothyroidism, unspecified: Secondary | ICD-10-CM | POA: Diagnosis present

## 2013-07-16 DIAGNOSIS — D729 Disorder of white blood cells, unspecified: Secondary | ICD-10-CM

## 2013-07-16 HISTORY — DX: Diverticulitis of intestine, part unspecified, without perforation or abscess without bleeding: K57.92

## 2013-07-16 HISTORY — DX: Noninfective gastroenteritis and colitis, unspecified: K52.9

## 2013-07-16 HISTORY — DX: Acute kidney failure, unspecified: N17.9

## 2013-07-16 LAB — URINALYSIS, ROUTINE W REFLEX MICROSCOPIC
GLUCOSE, UA: NEGATIVE mg/dL
Hgb urine dipstick: NEGATIVE
Ketones, ur: NEGATIVE mg/dL
Nitrite: NEGATIVE
Protein, ur: 30 mg/dL — AB
Specific Gravity, Urine: 1.019 (ref 1.005–1.030)
UROBILINOGEN UA: 0.2 mg/dL (ref 0.0–1.0)
pH: 5.5 (ref 5.0–8.0)

## 2013-07-16 LAB — CBC WITH DIFFERENTIAL/PLATELET
BASOS ABS: 0 10*3/uL (ref 0.0–0.1)
Basophils Absolute: 0 10*3/uL (ref 0.0–0.1)
Basophils Relative: 0 % (ref 0–1)
Basophils Relative: 0 % (ref 0–1)
Eosinophils Absolute: 0.2 10*3/uL (ref 0.0–0.7)
Eosinophils Absolute: 0.2 10*3/uL (ref 0.0–0.7)
Eosinophils Relative: 1 % (ref 0–5)
Eosinophils Relative: 2 % (ref 0–5)
HCT: 50.1 % — ABNORMAL HIGH (ref 36.0–46.0)
HCT: 41.2 % (ref 36.0–46.0)
HEMOGLOBIN: 13.9 g/dL (ref 12.0–15.0)
Hemoglobin: 17 g/dL — ABNORMAL HIGH (ref 12.0–15.0)
LYMPHS ABS: 2.1 10*3/uL (ref 0.7–4.0)
LYMPHS PCT: 11 % — AB (ref 12–46)
LYMPHS PCT: 17 % (ref 12–46)
Lymphs Abs: 1.8 10*3/uL (ref 0.7–4.0)
MCH: 32.7 pg (ref 26.0–34.0)
MCH: 32.7 pg (ref 26.0–34.0)
MCHC: 33.9 g/dL (ref 30.0–36.0)
MCHC: 33.7 g/dL (ref 30.0–36.0)
MCV: 96.3 fL (ref 78.0–100.0)
MCV: 96.9 fL (ref 78.0–100.0)
Monocytes Absolute: 1.4 10*3/uL — ABNORMAL HIGH (ref 0.1–1.0)
Monocytes Absolute: 1 10*3/uL (ref 0.1–1.0)
Monocytes Relative: 9 % (ref 3–12)
Monocytes Relative: 7 % (ref 3–12)
NEUTROS ABS: 12.2 10*3/uL — AB (ref 1.7–7.7)
NEUTROS PCT: 74 % (ref 43–77)
Neutro Abs: 9.6 10*3/uL — ABNORMAL HIGH (ref 1.7–7.7)
Neutrophils Relative %: 79 % — ABNORMAL HIGH (ref 43–77)
PLATELETS: 218 10*3/uL (ref 150–400)
Platelets: 201 10*3/uL (ref 150–400)
RBC: 5.2 MIL/uL — AB (ref 3.87–5.11)
RBC: 4.25 MIL/uL (ref 3.87–5.11)
RDW: 14.6 % (ref 11.5–15.5)
RDW: 14.3 % (ref 11.5–15.5)
WBC: 15.5 10*3/uL — AB (ref 4.0–10.5)
WBC: 13 10*3/uL — ABNORMAL HIGH (ref 4.0–10.5)

## 2013-07-16 LAB — PHOSPHORUS: Phosphorus: 3 mg/dL (ref 2.3–4.6)

## 2013-07-16 LAB — COMPREHENSIVE METABOLIC PANEL
ALBUMIN: 3.2 g/dL — AB (ref 3.5–5.2)
ALK PHOS: 112 U/L (ref 39–117)
ALT: 10 U/L (ref 0–35)
ALT: 7 U/L (ref 0–35)
AST: 22 U/L (ref 0–37)
AST: 13 U/L (ref 0–37)
Albumin: 3.9 g/dL (ref 3.5–5.2)
Alkaline Phosphatase: 94 U/L (ref 39–117)
BILIRUBIN TOTAL: 0.8 mg/dL (ref 0.3–1.2)
BUN: 30 mg/dL — ABNORMAL HIGH (ref 6–23)
BUN: 25 mg/dL — AB (ref 6–23)
CALCIUM: 8.7 mg/dL (ref 8.4–10.5)
CHLORIDE: 97 meq/L (ref 96–112)
CO2: 22 mEq/L (ref 19–32)
CO2: 20 mEq/L (ref 19–32)
Calcium: 9.7 mg/dL (ref 8.4–10.5)
Chloride: 102 mEq/L (ref 96–112)
Creatinine, Ser: 2.37 mg/dL — ABNORMAL HIGH (ref 0.50–1.10)
Creatinine, Ser: 1.9 mg/dL — ABNORMAL HIGH (ref 0.50–1.10)
GFR calc non Af Amer: 30 mL/min — ABNORMAL LOW (ref >90)
GFR, EST AFRICAN AMERICAN: 26 mL/min — AB (ref >90)
GFR, EST AFRICAN AMERICAN: 34 mL/min — AB (ref >90)
GFR, EST NON AFRICAN AMERICAN: 23 mL/min — AB (ref >90)
GLUCOSE: 107 mg/dL — AB (ref 70–99)
GLUCOSE: 126 mg/dL — AB (ref 70–99)
POTASSIUM: 5 meq/L (ref 3.7–5.3)
Potassium: 4 mEq/L (ref 3.7–5.3)
SODIUM: 138 meq/L (ref 137–147)
Sodium: 137 mEq/L (ref 137–147)
Total Bilirubin: 0.6 mg/dL (ref 0.3–1.2)
Total Protein: 7.9 g/dL (ref 6.0–8.3)
Total Protein: 6.6 g/dL (ref 6.0–8.3)

## 2013-07-16 LAB — LIPASE, BLOOD: Lipase: 28 U/L (ref 11–59)

## 2013-07-16 LAB — URINE MICROSCOPIC-ADD ON

## 2013-07-16 LAB — PROTIME-INR
INR: 1.04 (ref 0.00–1.49)
Prothrombin Time: 13.4 seconds (ref 11.6–15.2)

## 2013-07-16 LAB — OCCULT BLOOD, POC DEVICE: FECAL OCCULT BLD: POSITIVE — AB

## 2013-07-16 LAB — APTT: aPTT: 34 seconds (ref 24–37)

## 2013-07-16 LAB — MAGNESIUM: MAGNESIUM: 1.7 mg/dL (ref 1.5–2.5)

## 2013-07-16 MED ORDER — TEMAZEPAM 15 MG PO CAPS
15.0000 mg | ORAL_CAPSULE | Freq: Every evening | ORAL | Status: DC | PRN
  Administered 2013-07-16: 15 mg via ORAL
  Filled 2013-07-16: qty 1

## 2013-07-16 MED ORDER — ONDANSETRON HCL 4 MG PO TABS: 4.0000 mg | ORAL_TABLET | Freq: Four times a day (QID) | ORAL | Status: DC | PRN

## 2013-07-16 MED ORDER — SODIUM CHLORIDE 0.9 % IV BOLUS (SEPSIS)
1000.0000 mL | Freq: Once | INTRAVENOUS | Status: AC
  Administered 2013-07-16: 1000 mL via INTRAVENOUS

## 2013-07-16 MED ORDER — ACETAMINOPHEN 650 MG RE SUPP: 650.0000 mg | Freq: Four times a day (QID) | RECTAL | Status: DC | PRN

## 2013-07-16 MED ORDER — ONDANSETRON HCL 4 MG/2ML IJ SOLN: 4.0000 mg | Freq: Four times a day (QID) | INTRAMUSCULAR | Status: DC | PRN

## 2013-07-16 MED ORDER — THIAMINE HCL 100 MG/ML IJ SOLN
100.0000 mg | Freq: Every day | INTRAMUSCULAR | Status: DC
  Filled 2013-07-16 (×3): qty 1

## 2013-07-16 MED ORDER — SODIUM CHLORIDE 0.9 % IV SOLN
INTRAVENOUS | Status: DC
Start: 2013-07-16 — End: 2013-07-20
  Administered 2013-07-16 – 2013-07-18 (×6): via INTRAVENOUS
  Administered 2013-07-19 (×2): 125 mL via INTRAVENOUS
  Administered 2013-07-19 – 2013-07-20 (×2): via INTRAVENOUS

## 2013-07-16 MED ORDER — FOLIC ACID 1 MG PO TABS
1.0000 mg | ORAL_TABLET | Freq: Every day | ORAL | Status: DC
  Administered 2013-07-16 – 2013-07-20 (×5): 1 mg via ORAL
  Filled 2013-07-16 (×5): qty 1

## 2013-07-16 MED ORDER — ADULT MULTIVITAMIN W/MINERALS CH
1.0000 | ORAL_TABLET | Freq: Every day | ORAL | Status: DC
  Administered 2013-07-16 – 2013-07-20 (×5): 1 via ORAL
  Filled 2013-07-16 (×5): qty 1

## 2013-07-16 MED ORDER — HYDROMORPHONE HCL PF 1 MG/ML IJ SOLN
0.5000 mg | Freq: Once | INTRAMUSCULAR | Status: AC
  Administered 2013-07-16: 0.5 mg via INTRAVENOUS
  Filled 2013-07-16: qty 1

## 2013-07-16 MED ORDER — ONDANSETRON HCL 4 MG/2ML IJ SOLN
4.0000 mg | Freq: Once | INTRAMUSCULAR | Status: AC
  Administered 2013-07-16: 4 mg via INTRAVENOUS
  Filled 2013-07-16: qty 2

## 2013-07-16 MED ORDER — ACETAMINOPHEN 325 MG PO TABS: 650.0000 mg | ORAL_TABLET | Freq: Four times a day (QID) | ORAL | Status: DC | PRN

## 2013-07-16 MED ORDER — METRONIDAZOLE IN NACL 5-0.79 MG/ML-% IV SOLN
500.0000 mg | Freq: Once | INTRAVENOUS | Status: AC
  Administered 2013-07-16: 500 mg via INTRAVENOUS
  Filled 2013-07-16: qty 100

## 2013-07-16 MED ORDER — METRONIDAZOLE IN NACL 5-0.79 MG/ML-% IV SOLN: 500.0000 mg | Freq: Three times a day (TID) | INTRAVENOUS | Status: DC

## 2013-07-16 MED ORDER — CIPROFLOXACIN IN D5W 400 MG/200ML IV SOLN
400.0000 mg | Freq: Once | INTRAVENOUS | Status: DC
  Administered 2013-07-16: 400 mg via INTRAVENOUS
  Filled 2013-07-16: qty 200

## 2013-07-16 MED ORDER — VITAMIN D3 25 MCG (1000 UNIT) PO TABS
10000.0000 [IU] | ORAL_TABLET | Freq: Every day | ORAL | Status: DC
  Administered 2013-07-17 – 2013-07-20 (×4): 10000 [IU] via ORAL
  Filled 2013-07-16 (×4): qty 10

## 2013-07-16 MED ORDER — HYDROMORPHONE HCL PF 1 MG/ML IJ SOLN
1.0000 mg | INTRAMUSCULAR | Status: DC | PRN
  Administered 2013-07-16 – 2013-07-19 (×7): 1 mg via INTRAVENOUS
  Filled 2013-07-16 (×7): qty 1

## 2013-07-16 MED ORDER — LORAZEPAM 2 MG/ML IJ SOLN: 1.0000 mg | Freq: Four times a day (QID) | INTRAMUSCULAR | Status: AC | PRN

## 2013-07-16 MED ORDER — PANTOPRAZOLE SODIUM 40 MG PO TBEC
40.0000 mg | DELAYED_RELEASE_TABLET | Freq: Every day | ORAL | Status: DC
  Administered 2013-07-16 – 2013-07-18 (×3): 40 mg via ORAL
  Filled 2013-07-16 (×3): qty 1

## 2013-07-16 MED ORDER — GABAPENTIN 100 MG PO CAPS
100.0000 mg | ORAL_CAPSULE | Freq: Every day | ORAL | Status: DC
  Administered 2013-07-16 – 2013-07-19 (×4): 100 mg via ORAL
  Filled 2013-07-16 (×5): qty 1

## 2013-07-16 MED ORDER — LORAZEPAM 1 MG PO TABS: 1.0000 mg | ORAL_TABLET | Freq: Four times a day (QID) | ORAL | Status: AC | PRN

## 2013-07-16 MED ORDER — DICLOFENAC SODIUM 75 MG PO TBEC
150.0000 mg | DELAYED_RELEASE_TABLET | Freq: Two times a day (BID) | ORAL | Status: DC
  Filled 2013-07-16: qty 2

## 2013-07-16 MED ORDER — IOHEXOL 300 MG/ML  SOLN
50.0000 mL | Freq: Once | INTRAMUSCULAR | Status: AC | PRN
  Administered 2013-07-16: 25 mL via ORAL

## 2013-07-16 MED ORDER — HYDRALAZINE HCL 10 MG PO TABS
10.0000 mg | ORAL_TABLET | Freq: Three times a day (TID) | ORAL | Status: DC
  Administered 2013-07-16 – 2013-07-20 (×11): 10 mg via ORAL
  Filled 2013-07-16 (×14): qty 1

## 2013-07-16 MED ORDER — VITAMIN B-1 100 MG PO TABS
100.0000 mg | ORAL_TABLET | Freq: Every day | ORAL | Status: DC
  Administered 2013-07-16 – 2013-07-20 (×5): 100 mg via ORAL
  Filled 2013-07-16 (×5): qty 1

## 2013-07-16 MED ORDER — CIPROFLOXACIN IN D5W 400 MG/200ML IV SOLN
400.0000 mg | INTRAVENOUS | Status: DC
  Administered 2013-07-17 – 2013-07-18 (×2): 400 mg via INTRAVENOUS
  Filled 2013-07-16 (×3): qty 200

## 2013-07-16 MED ORDER — METRONIDAZOLE IN NACL 5-0.79 MG/ML-% IV SOLN
500.0000 mg | Freq: Three times a day (TID) | INTRAVENOUS | Status: DC
  Administered 2013-07-17 – 2013-07-20 (×11): 500 mg via INTRAVENOUS
  Filled 2013-07-16 (×12): qty 100

## 2013-07-16 MED ORDER — TIZANIDINE HCL 2 MG PO TABS
2.0000 mg | ORAL_TABLET | Freq: Every evening | ORAL | Status: DC | PRN
  Administered 2013-07-18: 2 mg via ORAL
  Filled 2013-07-16: qty 2

## 2013-07-16 NOTE — ED Notes (Signed)
Pt states she has had lower abd pain for a year. Pt states it has gotten worse in last few days. Pt has hx of diverticulosis with perfusion. Pt has already had appendix and gallbladder removed. Pt feels nauseated, but no emesis. PT states she has had diarrhea.

## 2013-07-16 NOTE — ED Provider Notes (Signed)
CSN: [id number]     Arrival date & time 07/16/13  1310 History   First MD Initiated Contact with Patient 07/16/13 1513     Chief Complaint  Patient presents with  . Abdominal Pain     (Consider location/radiation/quality/duration/timing/severity/associated sxs/prior Treatment) The history is provided by the patient. No language interpreter was used.  [name] is a 53 y/o F with PMHx of HTN, hypothyroidism, CKD, GERD, Diverticulitis presenting to the ED with abdominal pain that has been ongoing for the past 5 years stating that the pain has gotten worse starting yesterday. Patient reported that the pain was localized to the LLQ, but has now gone to the RLQ described as an intense cramping sensation that is constant with radiation across the lower abdomen and back. Patient reported that she started to have intermittent diarrhea and nausea. Patient reported that this morning she has had at least 4 bowel movements, all consisting of black tarry stools. Patient reported that she has been having decreased appetite due to feeling nauseous. Patient reported that she has been having decreased urination secondary to not being able to drink much due to nausea. Stated that she has been feeling dizzy due to not eating anything within the past 2 days. Patient reported that in August of 2014 she was diagnosed with diverticulitis with a perforation. Reported that she has been following Dr. Rayann Heman at [facility] in [place] regarding her abdomen, but is not content with care. Denied using medication for pain relief. Denied chest pain, shortness of breath, difficulty breathing, hematochezia, fever, vomiting, vaginal pain, vaginal bleeding, vaginal discharge.  PCP Dr. Damita Dunnings  Past Medical History  Diagnosis Date  . Hypertension   . Hypothyroidism   . Chronic kidney disease     stones  . GERD (gastroesophageal reflux disease)   . Arthritis     hands  . Diverticulitis    Past Surgical History   Procedure Laterality Date  . Abdominal hysterectomy    . Cholecystectomy    . Appendectomy    . Esophagogastroduodenoscopy  01/07/2012    Procedure: ESOPHAGOGASTRODUODENOSCOPY (EGD);  Surgeon: Winfield Cunas., MD;  Location: [facility] ENDOSCOPY;  Service: Endoscopy;  Laterality: N/A;  . Esophagogastroduodenoscopy  01/07/2012    Procedure: ESOPHAGOGASTRODUODENOSCOPY (EGD);  Surgeon: Winfield Cunas., MD;  Location: [facility] ENDOSCOPY;  Service: Endoscopy;  Laterality: N/A;  . Colonoscopy  01/09/2012    Procedure: COLONOSCOPY;  Surgeon: Lear Ng, MD;  Location: [facility] ENDOSCOPY;  Service: Endoscopy;  Laterality: N/A;   History reviewed. No pertinent family history. History  Substance Use Topics  . Smoking status: Current Every Day Smoker -- 0.25 packs/day for 20 years    Types: Cigarettes  . Smokeless tobacco: Never Used  . Alcohol Use: 16.8 oz/week    28 Shots of liquor per week     Comment: 01/10/13 - quit 6 to 7 months ago, prior heavy use   OB History   Grav Para Term Preterm Abortions TAB SAB Ect Mult Living   1         1     Review of Systems  Constitutional: Positive for chills. Negative for fever.  Respiratory: Negative for chest tightness and shortness of breath.   Cardiovascular: Negative for chest pain.  Gastrointestinal: Positive for nausea, abdominal pain, diarrhea and blood in stool. Negative for vomiting, constipation, anal bleeding and rectal pain.  Genitourinary: Positive for decreased urine volume. Negative for dysuria, vaginal bleeding, vaginal discharge and vaginal pain.  Musculoskeletal: Positive  for back pain.  Neurological: Positive for dizziness. Negative for weakness.  All other systems reviewed and are negative.      Allergies  Review of patient's allergies indicates no known allergies.  Home Medications   Current Outpatient Rx  Name  Route  Sig  Dispense  Refill  . Cholecalciferol (VITAMIN D-3) 5000 UNITS TABS   Oral   Take 10,000 Units by mouth  daily after breakfast.         . diclofenac (VOLTAREN) 75 MG EC tablet   Oral   Take 140 mg by mouth 2 (two) times daily.         Marland Kitchen gabapentin (NEURONTIN) 100 MG capsule   Oral   Take 100 mg by mouth at bedtime.         . hydrochlorothiazide (HYDRODIURIL) 25 MG tablet   Oral   Take 50 mg by mouth at bedtime.         Marland Kitchen lisinopril (PRINIVIL,ZESTRIL) 20 MG tablet   Oral   Take 20 mg by mouth daily.         Marland Kitchen omeprazole (PRILOSEC) 20 MG capsule   Oral   Take 40 mg by mouth 2 (two) times daily.         . ondansetron (ZOFRAN) 4 MG tablet   Oral   Take 4 mg by mouth every 4 (four) hours as needed for nausea or vomiting.          . temazepam (RESTORIL) 15 MG capsule   Oral   Take 15 mg by mouth at bedtime as needed for sleep.         Marland Kitchen tiZANidine (ZANAFLEX) 4 MG tablet   Oral   Take 2-4 mg by mouth at bedtime as needed for muscle spasms.          BP 161/81  Pulse 94  Temp(Src) 97.5 F (36.4 C) (Oral)  Resp 17  SpO2 96% Physical Exam  Nursing note and vitals reviewed. Constitutional: She is oriented to person, place, and time. She appears well-developed and well-nourished. No distress.  HENT:  Head: Normocephalic and atraumatic.  Dry mucus membranes  Eyes: Conjunctivae and EOM are normal. Pupils are equal, round, and reactive to light. Right eye exhibits no discharge. Left eye exhibits no discharge.  Neck: Normal range of motion. Neck supple. No tracheal deviation present.  Cardiovascular: Normal rate, regular rhythm and normal heart sounds.  Exam reveals no friction rub.   No murmur heard. Pulses:      Radial pulses are 2+ on the right side, and 2+ on the left side.       Dorsalis pedis pulses are 2+ on the right side, and 2+ on the left side.  Pulmonary/Chest: Effort normal and breath sounds normal. No respiratory distress. She has no wheezes. She has no rales. She exhibits no tenderness.  Abdominal: Soft. Bowel sounds are normal. There is tenderness.  There is guarding.  Soft upon palpation  Diffuse tenderness upon palpation to the abdomen - generalized discomfort.  Positive Murphy's sign Positive McBurney's point Positive rovsings  Patient had appendectomy and cholecystectomy performed  Genitourinary:  Rectal exam: Negative swelling, erythema, inflammation, lesions, sores, hemorrhoids noted to anus. Negative fissures, lesions, masses internal hemorrhoids palpated to the rectum. Negative blood on glove. Black tarry stools noted.  Exam chaperoned with RN.  Musculoskeletal: Normal range of motion.  Full ROM to upper and lower extremities without difficulty noted, negative ataxia noted.  Lymphadenopathy:    She has no cervical  adenopathy.  Neurological: She is alert and oriented to person, place, and time. She exhibits normal muscle tone. Coordination normal.  Skin: Skin is warm and dry. No rash noted. She is not diaphoretic. No erythema.  Psychiatric: She has a normal mood and affect. Her behavior is normal. Thought content normal.    ED Course  Procedures (including critical care time)  6:35 PM This provider spoke with Dr. Charlies Silvers - [facility] - regarding history, case, presentation, labs, imaging. Patient to be admitted to [place] for colitis, neutrophilic leukocytosis, acute renal insufficiency.   Results for orders placed during the hospital encounter of 07/16/13  URINALYSIS, ROUTINE W REFLEX MICROSCOPIC      Result Value Range   Color, Urine YELLOW  YELLOW   APPearance CLOUDY (*) CLEAR   Specific Gravity, Urine 1.019  1.005 - 1.030   pH 5.5  5.0 - 8.0   Glucose, UA NEGATIVE  NEGATIVE mg/dL   Hgb urine dipstick NEGATIVE  NEGATIVE   Bilirubin Urine SMALL (*) NEGATIVE   Ketones, ur NEGATIVE  NEGATIVE mg/dL   Protein, ur 30 (*) NEGATIVE mg/dL   Urobilinogen, UA 0.2  0.0 - 1.0 mg/dL   Nitrite NEGATIVE  NEGATIVE   Leukocytes, UA TRACE (*) NEGATIVE  CBC WITH DIFFERENTIAL      Result Value Range   WBC 15.5 (*) 4.0 - 10.5  K/uL   RBC 5.20 (*) 3.87 - 5.11 MIL/uL   Hemoglobin 17.0 (*) 12.0 - 15.0 g/dL   HCT 50.1 (*) 36.0 - 46.0 %   MCV 96.3  78.0 - 100.0 fL   MCH 32.7  26.0 - 34.0 pg   MCHC 33.9  30.0 - 36.0 g/dL   RDW 14.6  11.5 - 15.5 %   Platelets 218  150 - 400 K/uL   Neutrophils Relative % 79 (*) 43 - 77 %   Neutro Abs 12.2 (*) 1.7 - 7.7 K/uL   Lymphocytes Relative 11 (*) 12 - 46 %   Lymphs Abs 1.8  0.7 - 4.0 K/uL   Monocytes Relative 9  3 - 12 %   Monocytes Absolute 1.4 (*) 0.1 - 1.0 K/uL   Eosinophils Relative 1  0 - 5 %   Eosinophils Absolute 0.2  0.0 - 0.7 K/uL   Basophils Relative 0  0 - 1 %   Basophils Absolute 0.0  0.0 - 0.1 K/uL  COMPREHENSIVE METABOLIC PANEL      Result Value Range   Sodium 138  137 - 147 mEq/L   Potassium 5.0  3.7 - 5.3 mEq/L   Chloride 97  96 - 112 mEq/L   CO2 22  19 - 32 mEq/L   Glucose, Bld 107 (*) 70 - 99 mg/dL   BUN 30 (*) 6 - 23 mg/dL   Creatinine, Ser 2.37 (*) 0.50 - 1.10 mg/dL   Calcium 9.7  8.4 - 10.5 mg/dL   Total Protein 7.9  6.0 - 8.3 g/dL   Albumin 3.9  3.5 - 5.2 g/dL   AST 22  0 - 37 U/L   ALT 10  0 - 35 U/L   Alkaline Phosphatase 112  39 - 117 U/L   Total Bilirubin 0.8  0.3 - 1.2 mg/dL   GFR calc non Af Amer 23 (*) >90 mL/min   GFR calc Af Amer 26 (*) >90 mL/min  LIPASE, BLOOD      Result Value Range   Lipase 28  11 - 59 U/L  URINE MICROSCOPIC-ADD ON  Result Value Range   Squamous Epithelial / LPF FEW (*) RARE   WBC, UA 0-2  <3 WBC/hpf   RBC / HPF 0-2  <3 RBC/hpf   Bacteria, UA RARE  RARE   Casts HYALINE CASTS (*) NEGATIVE  OCCULT BLOOD, POC DEVICE      Result Value Range   Fecal Occult Bld POSITIVE (*) NEGATIVE   Ct Abdomen Pelvis Wo Contrast  07/16/2013   CLINICAL DATA:  Pt states she has had lower abd pain for a year. Pt states it has gotten worse in last few days. Pt has hx of diverticulosis with perfusion. Pt has already had appendix and gallbladder removed. Pt feels nauseated, but no emesis. PT states she has had diarrhea.  EXAM:  CT ABDOMEN AND PELVIS WITHOUT CONTRAST  TECHNIQUE: Multidetector CT imaging of the abdomen and pelvis was performed following the standard protocol without intravenous contrast.  COMPARISON:  01/10/2013  FINDINGS: Lung bases are unremarkable in appearance. Previous cholecystectomy. No focal abnormality identified within the liver, spleen, pancreas, adrenal glands, or left kidney. Low density lesion in the mid pole of the right kidney likely represents a cyst and measures 1.9 cm in diameter.  The stomach and small bowel loops are normal in appearance.  The ascending colon and hepatic flexure are notable for wall thickening and significant stranding. Scattered diverticula are identified within this segment of colon. The findings are most consistent with acute diverticulitis. Numerous colonic diverticula are identified throughout the length of the colon. The sigmoid colon show significant diverticulosis with no evidence for acute diverticulitis. No abscess or free intraperitoneal air. Small mesenteric lymph nodes are present.  The uterus is absent.  No adnexal mass identified.  IMPRESSION: 1. Colonic wall thickening and stranding in the ascending colon- hepatic flexure region. The location is less typical for diverticulitis. However, the patient does have numerous diverticula in this region. Given the history of diverticulitis, acute diverticulitis is certainly the most likely etiology. Other considerations would include inflammatory/infectious colitis. Ischemic colitis would be considered less likely. Given the long-standing symptoms and the location, neoplasm should also be considered. 2. No abscess or free intraperitoneal air.   Electronically Signed   By: Shon Hale M.D.   On: 07/16/2013 17:12   Labs Review Labs Reviewed  URINALYSIS, ROUTINE W REFLEX MICROSCOPIC - Abnormal; Notable for the following:    APPearance CLOUDY (*)    Bilirubin Urine SMALL (*)    Protein, ur 30 (*)    Leukocytes, UA TRACE (*)     All other components within normal limits  CBC WITH DIFFERENTIAL - Abnormal; Notable for the following:    WBC 15.5 (*)    RBC 5.20 (*)    Hemoglobin 17.0 (*)    HCT 50.1 (*)    Neutrophils Relative % 79 (*)    Neutro Abs 12.2 (*)    Lymphocytes Relative 11 (*)    Monocytes Absolute 1.4 (*)    All other components within normal limits  COMPREHENSIVE METABOLIC PANEL - Abnormal; Notable for the following:    Glucose, Bld 107 (*)    BUN 30 (*)    Creatinine, Ser 2.37 (*)    GFR calc non Af Amer 23 (*)    GFR calc Af Amer 26 (*)    All other components within normal limits  URINE MICROSCOPIC-ADD ON - Abnormal; Notable for the following:    Squamous Epithelial / LPF FEW (*)    Casts HYALINE CASTS (*)    All other  components within normal limits  OCCULT BLOOD, POC DEVICE - Abnormal; Notable for the following:    Fecal Occult Bld POSITIVE (*)    All other components within normal limits  LIPASE, BLOOD   Imaging Review Ct Abdomen Pelvis Wo Contrast  07/16/2013   CLINICAL DATA:  Pt states she has had lower abd pain for a year. Pt states it has gotten worse in last few days. Pt has hx of diverticulosis with perfusion. Pt has already had appendix and gallbladder removed. Pt feels nauseated, but no emesis. PT states she has had diarrhea.  EXAM: CT ABDOMEN AND PELVIS WITHOUT CONTRAST  TECHNIQUE: Multidetector CT imaging of the abdomen and pelvis was performed following the standard protocol without intravenous contrast.  COMPARISON:  01/10/2013  FINDINGS: Lung bases are unremarkable in appearance. Previous cholecystectomy. No focal abnormality identified within the liver, spleen, pancreas, adrenal glands, or left kidney. Low density lesion in the mid pole of the right kidney likely represents a cyst and measures 1.9 cm in diameter.  The stomach and small bowel loops are normal in appearance.  The ascending colon and hepatic flexure are notable for wall thickening and significant stranding. Scattered  diverticula are identified within this segment of colon. The findings are most consistent with acute diverticulitis. Numerous colonic diverticula are identified throughout the length of the colon. The sigmoid colon show significant diverticulosis with no evidence for acute diverticulitis. No abscess or free intraperitoneal air. Small mesenteric lymph nodes are present.  The uterus is absent.  No adnexal mass identified.  IMPRESSION: 1. Colonic wall thickening and stranding in the ascending colon- hepatic flexure region. The location is less typical for diverticulitis. However, the patient does have numerous diverticula in this region. Given the history of diverticulitis, acute diverticulitis is certainly the most likely etiology. Other considerations would include inflammatory/infectious colitis. Ischemic colitis would be considered less likely. Given the long-standing symptoms and the location, neoplasm should also be considered. 2. No abscess or free intraperitoneal air.   Electronically Signed   By: Shon Hale M.D.   On: 07/16/2013 17:12    EKG Interpretation   None      Date: 07/17/2013  Rate: 89  Rhythm: normal sinus rhythm  QRS Axis: normal  Intervals: normal  ST/T Wave abnormalities: normal  Conduction Disutrbances:none  Narrative Interpretation:   Old EKG Reviewed: unchanged EKG analyzed and reviewed by this provider and attending physician.     MDM   Diagnoses that have been ruled out:  None  Diagnoses that are still under consideration:  None  Final diagnoses:  Colitis  Neutrophilic leukocytosis  Acute renal insufficiency   Medications  ciprofloxacin (CIPRO) IVPB 400 mg (not administered)  metroNIDAZOLE (FLAGYL) IVPB 500 mg (500 mg Intravenous New Bag/Given 07/16/13 1754)  ondansetron (ZOFRAN) injection 4 mg (4 mg Intravenous Given 07/16/13 1431)  sodium chloride 0.9 % bolus 1,000 mL (0 mLs Intravenous Stopped 07/16/13 1736)  HYDROmorphone (DILAUDID) injection 0.5 mg (0.5 mg  Intravenous Given 07/16/13 1620)  iohexol (OMNIPAQUE) 300 MG/ML solution 50 mL (25 mLs Oral Contrast Given 07/16/13 1634)   Filed Vitals:   07/16/13 1335 07/16/13 1600 07/16/13 1735  BP: 93/75 122/62 161/81  Pulse: 105  94  Temp: 97.8 F (36.6 C)  97.5 F (36.4 C)  TempSrc: Oral  Oral  Resp: 18 18 17   SpO2: 99%  96%     Patient presenting to the ED with abdominal pain that has been ongoing for the past 5 years with worsening starting  yesterday. Reported that the pain started off on the LLQ, but has now transitioned to the LLQ with radiation across the lower abdomen and back. Reported decreased appetite, nausea, diarrhea, decreased urination. Reported that she has had at least 4 bowel movements of black tarry stools that started today.  Alert and oriented. GCS 15. Heart rate and rhythm normal. Lungs clear to auscultation. Radial and DP pulses 2+ bilaterally. BS normoactive in all quadrants - diffuse tenderness upon palpation to the abdomen. Positive Murphy's sign, positive rovsing's, positive McBurney's point. Black tarry stool on glove - otherwise unremarkable rectal exam.  CBC noted elevated WBC of 15.5 with elevated neutrophils of 12.2. Hgb 17.0, Hct 50.1. Fecal occult positive. UA noted trace of leukocytes with negative pyuria or nitrites. CMP noted elevated BUN of 30 and Cr of 2.37 - increased Cr from 1.15 compared to 6 months ago. Lipase negative elevation. CT abdomen and pelvis without contrast noted colonic wall thickening within the ascending colon-hepatic flexure with numerous diverticula noted - possible acute diverticulitis noted in this region - considerations of inflammatory versus infectious colitis - doubt ischemic. Negative findings of abscess or perforation.  Patient to be admitted to the hospital secondary to increased Creatinine and BUN over the course of 6 months. Patient to be admitted due to colitis findings. Patient given IV fluids and started on IV antibiotics for colitis.  Discussed imaging, results and labs with patient in great detail. Discussed with patient plan for admission - patient agreed. Discussed case with Dr. Charlies Silvers of [facility] - patient to be admitted to [place]. Patient stable, afebrile. Patient stable for transfer.    Jamse Mead, PA-C 07/17/13 [phone]

## 2013-07-16 NOTE — Progress Notes (Signed)
ANTIBIOTIC CONSULT NOTE - INITIAL  Pharmacy Consult for Ciprofloxacin Indication: Colitis  No Known Allergies  Patient Measurements:   Adjusted Body Weight:   Vital Signs: Temp: 97.5 F (36.4 C) (02/09 1735) Temp src: Oral (02/09 1735) BP: 145/92 mmHg (02/09 1830) Pulse Rate: 94 (02/09 1830) Intake/Output from previous day:   Intake/Output from this shift:    Labs:  Recent Labs  07/16/13 1429  WBC 15.5*  HGB 17.0*  PLT 218  CREATININE 2.37*   The CrCl is unknown because both a height and weight (above a minimum accepted value) are required for this calculation. No results found for this basename: VANCOTROUGH, VANCOPEAK, VANCORANDOM, GENTTROUGH, GENTPEAK, GENTRANDOM, TOBRATROUGH, TOBRAPEAK, TOBRARND, AMIKACINPEAK, AMIKACINTROU, AMIKACIN,  in the last 72 hours   Microbiology: No results found for this or any previous visit (from the past 720 hour(s)).  Medical History: Past Medical History  Diagnosis Date  . Hypertension   . Hypothyroidism   . Chronic kidney disease     stones  . GERD (gastroesophageal reflux disease)   . Arthritis     hands  . Diverticulitis     Assessment: 52 yoF with PMHx significant for HTN, hypothyroidism, CKD, GERD, and diverticulitis presents with abdominal pain. FOB+.  CT abd/pelvix shows colonic wall thickening with possible acute diverticulitis (inflammatory vs infectious colitis).  Pharmacy consulted to dose ciprofloxacin for empiric colitis.   2/9 >> Metronidazole >> 2/9 >> Ciprofloxacin >>  NDKA  SCr 2.37, estimated CrCl using previous weight (47kg) ~20 ml/min (CG) WBC 15.5 (neutrophils 79%) Tm24h: 97.5  Goal of Therapy:  Eradication of infection  Plan:  Ciprofloxacin 400mg  IV q 24 hours (reduced frequency due to CrCl <30)  Ralene Bathe, PharmD, BCPS 07/16/2013, 7:31 PM  Pager: [phone]

## 2013-07-16 NOTE — H&P (Addendum)
[facility] History and Physical  [name] [id number]:[id number] DOB: 12/17/1960 DOA: 07/16/2013  Referring physician: ED PCP: Crawford Givens, MD   Chief Complaint:  Abdominal pain worsen for 2 days with dark stool  HPI:  53 year old female with history of diverticulitis, chronic lower abdominal pain, hypertension, alcohol abuse and hypothyroidism who presented to the ED with worsening abdominal pain for past 2 days. Patient reports ongoing chronic abdominal pain over her right lower quadrant for almost 3 years. She was admitted in 2013 for acute abdominal pain and was found to have diverticulitis. She had colonoscopy done at that time which showed multiple diverticuli and internal hemorrhoids. and followed by endoscopy which showed antral gastric ulcer. Patient reports that for past 2 days she has worsening over abdominal pain primarily over the right lower quadrant radiating to the right upper quadrant, which crampy in nature. This was also associated with nausea but no vomiting. This morning she started having pain over her left lower quadrant radiating to the back and was more severe. She also noticed dark stools since this morning. He denies any diarrhea or constipation. Denies any recent travel or sick contacts. Denies eating anything outside. He reports poor appetite for past few days and because of nausea has not been able to eat well. Patient denies headache, dizziness, fever, chills,  vomiting, chest pain, palpitations, SOB,  bowel or urinary symptoms. Denies change in weight..  Course in the ED patient was afebrile, mildly tachycardic to 103, respiratory rate of 18, blood pressure of 93/51 mmHg and O2 sat of 98% on room air Blood work showed leukocytosis with WBC of 15.5. Hemoglobin of 17 and HCT of 50. Platelets of 218. Chemistry showed sodium of 138, K of 5, chloride 97, CO2 of 22, O2 kidney into the with BUN of 30 and creatinine of 2.37, glucose of 107. UA done that suggested of  UTI with mild proteinuria and highly endoscopy did a CT scan of the abdomen and pelvis without contrast showed colonic wall thickening and stranding in the ascending colon and hepatic flexure region along with numerous diverticula suggestive of acute diverticulitis. Even location all colonic thickening over the right colon and the hepatic flexure possibility of infectious colitis was also placed in the differential.  Patient given IV normal saline and started on IV ciprofloxacin and Flagyl and admitted to medical floor.  Review of Systems:  Constitutional: Denies fever, chills, diaphoresis, appetite change and fatigue.  HEENT: Denies photophobia, eye pain,  ear pain, congestion, sore throat, rhinorrhea, sneezing, mouth sores, trouble swallowing, neck pain, neck stiffness and tinnitus.   Respiratory: Denies SOB, DOE, cough, chest tightness,  and wheezing.   Cardiovascular: Denies chest pain, palpitations and leg swelling.  Gastrointestinal: Nausea, abdominal pain, dark stools, Denies  vomiting,  diarrhea, constipation, blood in stool and abdominal distention.  Genitourinary: Denies dysuria, urgency, frequency, hematuria, flank pain and difficulty urinating.  Endocrine: Denies hot or cold intolerance,  polyuria, polydipsia. Musculoskeletal: Denies myalgias, back pain, joint swelling, arthralgias and gait problem.  Skin: Denies pallor, rash and wound.  Neurological: Denies dizziness, seizures, syncope, weakness, light-headedness, numbness and headaches.  Psychiatric/Behavioral: Denies confusion, nervousness, sleep disturbance and agitation   Past Medical History  Diagnosis Date  . Hypertension   . Hypothyroidism   . Chronic kidney disease     stones  . GERD (gastroesophageal reflux disease)   . Arthritis     hands  . Diverticulitis    Past Surgical History  Procedure Laterality Date  . Abdominal  hysterectomy    . Cholecystectomy    . Appendectomy    . Esophagogastroduodenoscopy   01/07/2012    Procedure: ESOPHAGOGASTRODUODENOSCOPY (EGD);  Surgeon: Vertell Novak., MD;  Location: [facility] ENDOSCOPY;  Service: Endoscopy;  Laterality: N/A;  . Esophagogastroduodenoscopy  01/07/2012    Procedure: ESOPHAGOGASTRODUODENOSCOPY (EGD);  Surgeon: Vertell Novak., MD;  Location: [facility] ENDOSCOPY;  Service: Endoscopy;  Laterality: N/A;  . Colonoscopy  01/09/2012    Procedure: COLONOSCOPY;  Surgeon: Shirley Friar, MD;  Location: [facility] ENDOSCOPY;  Service: Endoscopy;  Laterality: N/A;   Social History:  reports that she has been smoking Cigarettes.  She has a 5 pack-year smoking history. She has never used smokeless tobacco. She reports that she drinks about 16.8 ounces of alcohol per week. She reports that she does not use illicit drugs.  No Known Allergies  History reviewed. No pertinent family history.  Prior to Admission medications   Medication Sig Start Date End Date Taking? Authorizing Provider  Cholecalciferol (VITAMIN D-3) 5000 UNITS TABS Take 10,000 Units by mouth daily after breakfast.   Yes Historical Provider, MD  diclofenac (VOLTAREN) 75 MG EC tablet Take 140 mg by mouth 2 (two) times daily.   Yes Historical Provider, MD  gabapentin (NEURONTIN) 100 MG capsule Take 100 mg by mouth at bedtime.   Yes Historical Provider, MD  hydrochlorothiazide (HYDRODIURIL) 25 MG tablet Take 50 mg by mouth at bedtime.   Yes Historical Provider, MD  lisinopril (PRINIVIL,ZESTRIL) 20 MG tablet Take 20 mg by mouth daily.   Yes Historical Provider, MD  omeprazole (PRILOSEC) 20 MG capsule Take 40 mg by mouth 2 (two) times daily.   Yes Historical Provider, MD  ondansetron (ZOFRAN) 4 MG tablet Take 4 mg by mouth every 4 (four) hours as needed for nausea or vomiting.    Yes Historical Provider, MD  temazepam (RESTORIL) 15 MG capsule Take 15 mg by mouth at bedtime as needed for sleep.   Yes Historical Provider, MD  tiZANidine (ZANAFLEX) 4 MG tablet Take 2-4 mg by mouth at bedtime as needed for muscle  spasms.   Yes Historical Provider, MD     Physical Exam:  Filed Vitals:   07/16/13 1735 07/16/13 1745 07/16/13 1830 07/16/13 1953  BP: 161/81 146/76 145/92 93/51  Pulse: 94 93 94 103  Temp: 97.5 F (36.4 C)   97.9 F (36.6 C)  TempSrc: Oral   Oral  Resp: 17 19 18    Height:    4\' 10"  (1.473 m)  Weight:    47.129 kg (103 lb 14.4 oz)  SpO2: 96% 96% 97% 98%    Constitutional: Vital signs reviewed.  Patient is a middle aged thin built female in no acute distress. HEENT: no pallor, no icterus, dry oral mucosa, no cervical lymphadenopathy Cardiovascular: RRR, S1 normal, S2 normal, no MRG Chest: CTAB, no wheezes, rales, or rhonchi Abdominal: Soft.  non-distended, bowel sounds hyperactive, tender to palpation over bilateral lower quadrant ( left >right ) , no masses, organomegaly, or guarding present.  GU: no CVA tenderness Ext: warm, no edema Neurological: A&O x3, non focal  Labs on Admission:  Basic Metabolic Panel:  Recent Labs Lab 07/16/13 1429  NA 138  K 5.0  CL 97  CO2 22  GLUCOSE 107*  BUN 30*  CREATININE 2.37*  CALCIUM 9.7   Liver Function Tests:  Recent Labs Lab 07/16/13 1429  AST 22  ALT 10  ALKPHOS 112  BILITOT 0.8  PROT 7.9  ALBUMIN 3.9  Recent Labs Lab 07/16/13 1429  LIPASE 28   No results found for this basename: AMMONIA,  in the last 168 hours CBC:  Recent Labs Lab 07/16/13 1429  WBC 15.5*  NEUTROABS 12.2*  HGB 17.0*  HCT 50.1*  MCV 96.3  PLT 218   Cardiac Enzymes: No results found for this basename: CKTOTAL, CKMB, CKMBINDEX, TROPONINI,  in the last 168 hours BNP: No components found with this basename: POCBNP,  CBG: No results found for this basename: GLUCAP,  in the last 168 hours  Radiological Exams on Admission: Ct Abdomen Pelvis Wo Contrast  07/16/2013   CLINICAL DATA:  Pt states she has had lower abd pain for a year. Pt states it has gotten worse in last few days. Pt has hx of diverticulosis with perfusion. Pt has  already had appendix and gallbladder removed. Pt feels nauseated, but no emesis. PT states she has had diarrhea.  EXAM: CT ABDOMEN AND PELVIS WITHOUT CONTRAST  TECHNIQUE: Multidetector CT imaging of the abdomen and pelvis was performed following the standard protocol without intravenous contrast.  COMPARISON:  01/10/2013  FINDINGS: Lung bases are unremarkable in appearance. Previous cholecystectomy. No focal abnormality identified within the liver, spleen, pancreas, adrenal glands, or left kidney. Low density lesion in the mid pole of the right kidney likely represents a cyst and measures 1.9 cm in diameter.  The stomach and small bowel loops are normal in appearance.  The ascending colon and hepatic flexure are notable for wall thickening and significant stranding. Scattered diverticula are identified within this segment of colon. The findings are most consistent with acute diverticulitis. Numerous colonic diverticula are identified throughout the length of the colon. The sigmoid colon show significant diverticulosis with no evidence for acute diverticulitis. No abscess or free intraperitoneal air. Small mesenteric lymph nodes are present.  The uterus is absent.  No adnexal mass identified.  IMPRESSION: 1. Colonic wall thickening and stranding in the ascending colon- hepatic flexure region. The location is less typical for diverticulitis. However, the patient does have numerous diverticula in this region. Given the history of diverticulitis, acute diverticulitis is certainly the most likely etiology. Other considerations would include inflammatory/infectious colitis. Ischemic colitis would be considered less likely. Given the long-standing symptoms and the location, neoplasm should also be considered. 2. No abscess or free intraperitoneal air.   Electronically Signed   By: Rosalie Gums M.D.   On: 07/16/2013 17:12      Assessment/Plan  Principal Problem:   Acute Colitis Noncontrast CT of the abdomen shows  possible acute diverticulitis versus inflammatory/infectious colitis. Admit to MedSurg. Place on clear liquids. Pain control with when necessary Dilaudid. Patient had colonoscopy in 2013 which showed diffuse diverticulosis and internal hemorrhoids. I do not see any results of the colonic mucosa biopsy done at that time. (Seen by [facility]) -Monitor with serial abdominal exam. Will order lactic acid.  -cover Empirically with IV ciprofloxacin and Flagyl. Check stool for C. difficile and GI pathogen -Continue IV hydration with pain and antiemetics. -She will need GI evaluation as outpatient unless symptoms worsen and has drop in her hemoglobin while in the hospital.  Active Problems:   Acute renal failure Possibly prerenal with dehydration. Likely has some underlying chronic kidney disease as well. She was admitted with acute kidney injury in August of 2014 as well and improved with IV fluids. Patient is on HCTZ and lisinopril as well as diclofenac twice daily. On repeated questioning she clearly denies use of over-the-counter NSAIDs for pain. UA  does suggest UTI with some proteinuria and hyaline cast. Check urine lites and urine tox. Check ultrasound of the abdomen.  -I will hydrate her with normal saline at 125 cc per hour. Monitor renal function in the morning.    Alcohol abuse She reports to drinking 2-3 times a week ( 2-3 drinks of vodka at a time). Hospital course 4 days back. Denies withdrawal symptoms. -Will place on CIWA     Protein-calorie malnutrition, severe We will obtain a nutrition consult     HTN (hypertension) -hold  HCTZ and lisinopril. Ordered when necessary hydralazine.  History of antral ulcer Will place on PPI daily   Diet:clears DVT prophylaxis: SCD   Code Status: full code Family Communication: discussed with  patient Disposition Plan: home once improved  Ron Beske [facility] Pager [phone]  Total time spent on admission :70 minutes  If  7PM-7AM, please contact night-coverage [email or web address] Password [email or web address] 07/16/2013, 8:01 PM

## 2013-07-17 ENCOUNTER — Inpatient Hospital Stay (HOSPITAL_COMMUNITY): Payer: BC Managed Care – PPO

## 2013-07-17 DIAGNOSIS — I10 Essential (primary) hypertension: Secondary | ICD-10-CM

## 2013-07-17 DIAGNOSIS — N289 Disorder of kidney and ureter, unspecified: Secondary | ICD-10-CM

## 2013-07-17 LAB — COMPREHENSIVE METABOLIC PANEL
ALT: 7 U/L (ref 0–35)
AST: 14 U/L (ref 0–37)
Albumin: 3.1 g/dL — ABNORMAL LOW (ref 3.5–5.2)
Alkaline Phosphatase: 91 U/L (ref 39–117)
BUN: 19 mg/dL (ref 6–23)
CO2: 23 mEq/L (ref 19–32)
Calcium: 8.8 mg/dL (ref 8.4–10.5)
Chloride: 104 mEq/L (ref 96–112)
Creatinine, Ser: 1.56 mg/dL — ABNORMAL HIGH (ref 0.50–1.10)
GFR calc Af Amer: 43 mL/min — ABNORMAL LOW (ref >90)
GFR calc non Af Amer: 37 mL/min — ABNORMAL LOW (ref >90)
Glucose, Bld: 96 mg/dL (ref 70–99)
POTASSIUM: 4.2 meq/L (ref 3.7–5.3)
SODIUM: 139 meq/L (ref 137–147)
TOTAL PROTEIN: 6.4 g/dL (ref 6.0–8.3)
Total Bilirubin: 0.5 mg/dL (ref 0.3–1.2)

## 2013-07-17 LAB — CBC
HCT: 40.9 % (ref 36.0–46.0)
Hemoglobin: 13.4 g/dL (ref 12.0–15.0)
MCH: 32.1 pg (ref 26.0–34.0)
MCHC: 32.8 g/dL (ref 30.0–36.0)
MCV: 98.1 fL (ref 78.0–100.0)
Platelets: 211 10*3/uL (ref 150–400)
RBC: 4.17 MIL/uL (ref 3.87–5.11)
RDW: 14.3 % (ref 11.5–15.5)
WBC: 11 10*3/uL — AB (ref 4.0–10.5)

## 2013-07-17 LAB — CREATININE, URINE, RANDOM: CREATININE, URINE: 150.6 mg/dL

## 2013-07-17 LAB — GI PATHOGEN PANEL BY PCR, STOOL
C DIFFICILE TOXIN A/B: POSITIVE
Campylobacter by PCR: NEGATIVE
Cryptosporidium by PCR: NEGATIVE
E coli (ETEC) LT/ST: NEGATIVE
E coli (STEC): NEGATIVE
E coli 0157 by PCR: NEGATIVE
G lamblia by PCR: NEGATIVE
Norovirus GI/GII: NEGATIVE
ROTAVIRUS A BY PCR: POSITIVE
Salmonella by PCR: NEGATIVE
Shigella by PCR: NEGATIVE

## 2013-07-17 LAB — RAPID URINE DRUG SCREEN, HOSP PERFORMED
Amphetamines: NOT DETECTED
BENZODIAZEPINES: NOT DETECTED
Barbiturates: NOT DETECTED
Cocaine: NOT DETECTED
OPIATES: NOT DETECTED
Tetrahydrocannabinol: NOT DETECTED

## 2013-07-17 LAB — OSMOLALITY, URINE: Osmolality, Ur: 491 mOsm/kg (ref 390–1090)

## 2013-07-17 LAB — GLUCOSE, CAPILLARY: Glucose-Capillary: 96 mg/dL (ref 70–99)

## 2013-07-17 LAB — TSH: TSH: 6.474 u[IU]/mL — ABNORMAL HIGH (ref 0.350–4.500)

## 2013-07-17 LAB — SODIUM, URINE, RANDOM: Sodium, Ur: 70 mEq/L

## 2013-07-17 MED ORDER — BOOST / RESOURCE BREEZE PO LIQD
1.0000 | Freq: Three times a day (TID) | ORAL | Status: DC
  Administered 2013-07-17 – 2013-07-20 (×9): 1 via ORAL

## 2013-07-17 MED ORDER — DIPHENHYDRAMINE HCL 25 MG PO CAPS
25.0000 mg | ORAL_CAPSULE | Freq: Four times a day (QID) | ORAL | Status: DC | PRN
  Administered 2013-07-17 – 2013-07-19 (×4): 25 mg via ORAL
  Filled 2013-07-17 (×4): qty 1

## 2013-07-17 NOTE — Progress Notes (Signed)
[facility] PROGRESS NOTE  [name] [id number]:[id number] DOB: 02-03-1961 DOA: 07/16/2013 PCP: Elsie Stain, MD  Assessment/Plan: Acute Colitis  Noncontrast CT of the abdomen shows possible acute diverticulitis versus inflammatory/infectious colitis.  Admit to MedSurg.  Place on clear liquids. Pain control with when necessary Dilaudid. Patient had colonoscopy in 2013 which showed diffuse diverticulosis and internal hemorrhoids. I do not see any results of the colonic mucosa biopsy done at that time. (Seen by [facility])  -Monitor with serial abdominal exam. Will order lactic acid.  -cover Empirically with IV ciprofloxacin and Flagyl. Check stool for C. difficile and GI pathogen  -Continue IV hydration with pain and antiemetics.  -She will need GI evaluation as outpatient unless symptoms worsen and has drop in her hemoglobin while in the hospital.  Active Problems:  Acute renal failure  -Multifactorial including dehydration, along with HCTZ in setting of lisinopril and [place]  - Likely has some underlying chronic kidney disease as well. She was admitted with acute kidney injury in August of 2014 as well and improved with IV fluids.  -Creatinine improving with hydration, renal ultrasound with cyst otherwise no hydronephrosis Alcohol abuse  She previously reported drinking 2-3 times a week ( 2-3 drinks of vodka at a time). -Continue CIWA, no evidence of withdrawal at this Protein-calorie malnutrition, severe   nutrition consulted on admission  HTN (hypertension)  -Continue holding HCTZ and lisinopril. On necessary hydralazine.  History of antral ulcer  Continue on PPI daily   Code Status: Full code Family Communication: None at bedside  Disposition Plan: To home when medically   Consultants:  none  Procedures:  none  Antibiotics:  Flagyl started on 2/9  cipro Started on 2/9  HPI/Subjective: Still some abdominal pain but less, denies any nausea or  vomiting  Objective: Filed Vitals:   07/17/13 1405  BP: 123/73  Pulse: 93  Temp: 97.5 F (36.4 C)  Resp: 20    Intake/Output Summary (Last 24 hours) at 07/17/13 1856 Last data filed at 07/17/13 1806  Gross per 24 hour  Intake 3594.58 ml  Output   1600 ml  Net 1994.58 ml   Filed Weights   07/16/13 1953 07/17/13 0500  Weight: 47.129 kg (103 lb 14.4 oz) 47.4 kg (104 lb 8 oz)    Exam:  General: alert & oriented x 3 In NAD Cardiovascular: RRR, nl S1 s2 Respiratory: CTAB Abdomen: soft +BS right greater than left lower abdominal tenderness, no rebound/ND, no masses palpable Extremities: No cyanosis and no edema. No tremor    Data Reviewed: Basic Metabolic Panel:  Recent Labs Lab 07/16/13 1429 07/16/13 2010 07/17/13 0445  NA 138 137 139  K 5.0 4.0 4.2  CL 97 102 104  CO2 22 20 23   GLUCOSE 107* 126* 96  BUN 30* 25* 19  CREATININE 2.37* 1.90* 1.56*  CALCIUM 9.7 8.7 8.8  MG  --  1.7  --   PHOS  --  3.0  --    Liver Function Tests:  Recent Labs Lab 07/16/13 1429 07/16/13 2010 07/17/13 0445  AST 22 13 14   ALT 10 7 7   ALKPHOS 112 94 91  BILITOT 0.8 0.6 0.5  PROT 7.9 6.6 6.4  ALBUMIN 3.9 3.2* 3.1*    Recent Labs Lab 07/16/13 1429  LIPASE 28   No results found for this basename: AMMONIA,  in the last 168 hours CBC:  Recent Labs Lab 07/16/13 1429 07/16/13 2010 07/17/13 0445  WBC 15.5* 13.0* 11.0*  NEUTROABS 12.2* 9.6*  --  HGB 17.0* 13.9 13.4  HCT 50.1* 41.2 40.9  MCV 96.3 96.9 98.1  PLT 218 201 211   Cardiac Enzymes: No results found for this basename: CKTOTAL, CKMB, CKMBINDEX, TROPONINI,  in the last 168 hours BNP (last 3 results) No results found for this basename: PROBNP,  in the last 8760 hours CBG:  Recent Labs Lab 07/17/13 0737  GLUCAP 96    No results found for this or any previous visit (from the past 240 hour(s)).   Studies: Ct Abdomen Pelvis Wo Contrast  07/16/2013   CLINICAL DATA:  Pt states she has had lower abd pain  for a year. Pt states it has gotten worse in last few days. Pt has hx of diverticulosis with perfusion. Pt has already had appendix and gallbladder removed. Pt feels nauseated, but no emesis. PT states she has had diarrhea.  EXAM: CT ABDOMEN AND PELVIS WITHOUT CONTRAST  TECHNIQUE: Multidetector CT imaging of the abdomen and pelvis was performed following the standard protocol without intravenous contrast.  COMPARISON:  01/10/2013  FINDINGS: Lung bases are unremarkable in appearance. Previous cholecystectomy. No focal abnormality identified within the liver, spleen, pancreas, adrenal glands, or left kidney. Low density lesion in the mid pole of the right kidney likely represents a cyst and measures 1.9 cm in diameter.  The stomach and small bowel loops are normal in appearance.  The ascending colon and hepatic flexure are notable for wall thickening and significant stranding. Scattered diverticula are identified within this segment of colon. The findings are most consistent with acute diverticulitis. Numerous colonic diverticula are identified throughout the length of the colon. The sigmoid colon show significant diverticulosis with no evidence for acute diverticulitis. No abscess or free intraperitoneal air. Small mesenteric lymph nodes are present.  The uterus is absent.  No adnexal mass identified.  IMPRESSION: 1. Colonic wall thickening and stranding in the ascending colon- hepatic flexure region. The location is less typical for diverticulitis. However, the patient does have numerous diverticula in this region. Given the history of diverticulitis, acute diverticulitis is certainly the most likely etiology. Other considerations would include inflammatory/infectious colitis. Ischemic colitis would be considered less likely. Given the long-standing symptoms and the location, neoplasm should also be considered. 2. No abscess or free intraperitoneal air.   Electronically Signed   By: Shon Hale M.D.   On: 07/16/2013  17:12   US Renal  07/17/2013   CLINICAL DATA:  Acute renal insufficiency.  Hypertension.  EXAM: RENAL/URINARY TRACT ULTRASOUND COMPLETE  COMPARISON:  CT abdomen pelvis without contrast 07/16/2013 and abdominal ultrasound 05/24/2010  FINDINGS: Right Kidney:  Length: 8 point 7 cm sagittal length. Echogenicity of the renal cortex is slightly increased compared to the adjacent liver. There is a 1.5 x 1.6 x 1.4 cm cyst in the upper pole the right kidney. Negative for hydronephrosis.  Left Kidney:  Length: 9.1 cm in sagittal length. Echogenicity appears within normal limits. Negative for mass or hydronephrosis.  Bladder:  Appears normal for degree of bladder distention.  IMPRESSION: 1. Negative for hydronephrosis. 2. Echogenicity of the right kidney appears slightly increased, which can be seen in the setting of chronic medical renal disease. 3. 1.6 cm cyst right kidney.   Electronically Signed   By: Curlene Dolphin M.D.   On: 07/17/2013 08:33    Scheduled Meds: . cholecalciferol  10,000 Units Oral Daily  . ciprofloxacin  400 mg Intravenous Q24H  . feeding supplement (RESOURCE BREEZE)  1 Container Oral TID BM  . folic acid  1 mg Oral Daily  . gabapentin  100 mg Oral QHS  . hydrALAZINE  10 mg Oral Q8H  . metronidazole  500 mg Intravenous Q8H  . multivitamin with minerals  1 tablet Oral Daily  . pantoprazole  40 mg Oral Daily  . thiamine  100 mg Oral Daily   Or  . thiamine  100 mg Intravenous Daily   Continuous Infusions: . sodium chloride 125 mL/hr at 07/17/13 1559    Principal Problem:   Colitis Active Problems:   Leukocytosis   Alcohol abuse   Protein-calorie malnutrition, severe   Acute renal failure   HTN (hypertension)   Acute diverticulitis    Time spent: [facility] Pager [phone]. If 7PM-7AM, please contact night-coverage at [email or web address], password [facility] 07/17/2013, 6:56 PM  LOS: 1 day

## 2013-07-17 NOTE — Progress Notes (Signed)
INITIAL NUTRITION ASSESSMENT  Pt meets criteria for severe MALNUTRITION in the context of chronic illness as evidenced by <50% estimated energy intake in the past 1-2 months per pt report in addition to pt with severe muscle wasting and subcutaneous fat loss in upper arms.  DOCUMENTATION CODES Per approved criteria  -Severe malnutrition in the context of chronic illness   INTERVENTION: - Diet advancement per MD - Resource Breeze TID - Will continue to monitor   NUTRITION DIAGNOSIS: Inadequate oral intake related to clear liquid diet as evidenced by diet order.   Goal: Advance diet as tolerated to low fiber diet  Monitor:  Weights, labs, diet advancement, nausea, diarrhea  Reason for Assessment: Malnutrition screening tool   53 y.o. female  Admitting Dx: Colitis  ASSESSMENT: Pt with history of diverticulitis, chronic lower abdominal pain, hypertension, alcohol abuse and hypothyroidism who presented to the ED with worsening abdominal pain for past 2 days. Patient reports ongoing chronic abdominal pain over her right lower quadrant for almost 3 years. She was admitted in 2013 for acute abdominal pain and was found to have diverticulitis. She had colonoscopy done at that time which showed multiple diverticuli and internal hemorrhoids. and followed by endoscopy which showed antral gastric ulcer.  Patient reports that for past 2 days she has worsening over abdominal pain primarily over the right lower quadrant radiating to the right upper quadrant, which crampy in nature. This was also associated with nausea but no vomiting. This morning she started having pain over her left lower quadrant radiating to the back and was more severe. She also noticed dark stools since this morning. He denies any diarrhea or constipation. Denies any recent travel or sick contacts. Denies eating anything outside. He reports poor appetite for past few days and because of nausea has not been able to eat well. She  reports to drinking 2-3 times a week (2-3 drinks of vodka at a time). Denies withdrawal symptoms. Found to have acute colitis.   Met with pt who reports having nausea for the past year with diarrhea for the past 3 months, occuring 7-8 times per day. Reports 25-30 pound unintended weight loss in the past 4 months. 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 States she only eats 1 meal/day which consists of either deer meat or chicken, no fruits or vegetables. Reports she has been eating this way for the past 1-2 months. Did not eat anything for 2 days PTA. Has been on Ensure in the past but didn't drink them recently because she couldn't afford them. Reports having 1 episode of diarrhea so far today. Ate jello and juice on clear liquid tray this morning.   Nutrition Focused Physical Exam:  Subcutaneous Fat:  Orbital Region: WNL Upper Arm Region: severe wasting Thoracic and Lumbar Region: NA  Muscle:  Temple Region: WNL Clavicle Bone Region: mild/moderate wasting Clavicle and Acromion Bone Region: mild/moderate wasting Scapular Bone Region: NA Dorsal Hand: mild/moderate wasting Patellar Region: mild/moderate wasting Anterior Thigh Region: mild/moderate wasting Posterior Calf Region: mild/moderate wasting  Edema: None noted   Height: Ht Readings from Last 1 Encounters:  07/16/13 '4\' 10"'  (1.473 m)    Weight: Wt Readings from Last 1 Encounters:  07/17/13 104 lb 8 oz (47.4 kg)    Ideal Body Weight: 97 lb  % Ideal Body Weight: 107%  Wt Readings from Last 10 Encounters:  07/17/13 104 lb 8 oz (47.4 kg)  01/11/13 104 lb 8 oz (47.4 kg)  01/10/13 103 lb (46.72 kg)  01/07/12 107 lb 9.4 oz (48.8 kg)  01/07/12 107 lb 9.4 oz (48.8 kg)  01/07/12 107 lb 9.4 oz (48.8 kg)  01/07/12 107 lb 9.4 oz (48.8 kg)  03/03/10 120 lb 12 oz (54.772 kg)    Usual Body Weight: 129-134 lb per pt  % Usual Body Weight: 78-81%  BMI:  Body mass  index is 21.85 kg/(m^2).  Estimated Nutritional Needs: Kcal: 1200-1400 Protein: 55-70g Fluid: 1.2-1.4L/day   Skin: Intact  Diet Order: Clear Liquid  EDUCATION NEEDS: -No education needs identified at this time   Intake/Output Summary (Last 24 hours) at 07/17/13 1047 Last data filed at 07/17/13 1000  Gross per 24 hour  Intake 1602.08 ml  Output    950 ml  Net 652.08 ml    Last BM: 2/10  Labs:   Recent Labs Lab 07/16/13 1429 07/16/13 2010 07/17/13 0445  NA 138 137 139  K 5.0 4.0 4.2  CL 97 102 104  CO2 '22 20 23  ' BUN 30* 25* 19  CREATININE 2.37* 1.90* 1.56*  CALCIUM 9.7 8.7 8.8  MG  --  1.7  --   PHOS  --  3.0  --   GLUCOSE 107* 126* 96    CBG (last 3)   Recent Labs  07/17/13 0737  GLUCAP 96    Scheduled Meds: . cholecalciferol  10,000 Units Oral Daily  . ciprofloxacin  400 mg Intravenous Q24H  . folic acid  1 mg Oral Daily  . gabapentin  100 mg Oral QHS  . hydrALAZINE  10 mg Oral Q8H  . metronidazole  500 mg Intravenous Q8H  . multivitamin with minerals  1 tablet Oral Daily  . pantoprazole  40 mg Oral Daily  . thiamine  100 mg Oral Daily   Or  . thiamine  100 mg Intravenous Daily    Continuous Infusions: . sodium chloride 125 mL/hr at 07/17/13 0165    Past Medical History  Diagnosis Date  . Hypertension   . Hypothyroidism   . Chronic kidney disease     stones  . GERD (gastroesophageal reflux disease)   . Arthritis     hands  . Diverticulitis     Past Surgical History  Procedure Laterality Date  . Abdominal hysterectomy    . Cholecystectomy    . Appendectomy    . Esophagogastroduodenoscopy  01/07/2012    Procedure: ESOPHAGOGASTRODUODENOSCOPY (EGD);  Surgeon: Winfield Cunas., MD;  Location[facility] ENDOSCOPY;  Service: Endoscopy;  Laterality: N/A;  . Esophagogastroduodenoscopy  01/07/2012    Procedure: ESOPHAGOGASTRODUODENOSCOPY (EGD);  Surgeon: Winfield Cunas., MD;  Location: [facility] ENDOSCOPY;  Service: Endoscopy;  Laterality: N/A;   . Colonoscopy  01/09/2012    Procedure: COLONOSCOPY;  Surgeon: Lear Ng, MD;  Location: [facility] ENDOSCOPY;  Service: Endoscopy;  Laterality: N/A;    Mikey College MS, [place], [place] Pager [phone] After Hours Pager

## 2013-07-17 NOTE — Care Management Note (Signed)
    Page 1 of 1   07/17/2013     11:50:56 AM   CARE MANAGEMENT NOTE 07/17/2013  Patient:  [name], [name]   Account Number:  [id number]  Date Initiated:  07/17/2013  Documentation initiated by:  Sunday Spillers  Subjective/Objective Assessment:   53 yo female admitted with diverticulitis vs colitis. PTA lived at home.     Action/Plan:   Home when stable   Anticipated DC Date:  07/20/2013   Anticipated DC Plan:  [place]  CM consult      Choice offered to / List presented to:             Status of service:  Completed, signed off Medicare Important Message given?   (If response is "NO", the following Medicare IM given date fields will be blank) Date Medicare IM given:   Date Additional Medicare IM given:    Discharge Disposition:  HOME/SELF CARE  Per UR Regulation:  Reviewed for med. necessity/level of care/duration of stay  If discussed at [place] of Stay Meetings, dates discussed:    Comments:

## 2013-07-17 NOTE — ED Provider Notes (Signed)
Medical screening examination/treatment/procedure(s) were performed by non-physician practitioner and as supervising physician I was immediately available for consultation/collaboration.     Threasa Beards, MD 07/17/13 [phone]

## 2013-07-18 DIAGNOSIS — D72829 Elevated white blood cell count, unspecified: Secondary | ICD-10-CM

## 2013-07-18 DIAGNOSIS — A0472 Enterocolitis due to Clostridium difficile, not specified as recurrent: Principal | ICD-10-CM

## 2013-07-18 DIAGNOSIS — F1010 Alcohol abuse, uncomplicated: Secondary | ICD-10-CM

## 2013-07-18 LAB — COMPREHENSIVE METABOLIC PANEL
ALK PHOS: 93 U/L (ref 39–117)
ALT: 7 U/L (ref 0–35)
AST: 15 U/L (ref 0–37)
Albumin: 3.1 g/dL — ABNORMAL LOW (ref 3.5–5.2)
BUN: 7 mg/dL (ref 6–23)
CALCIUM: 9.1 mg/dL (ref 8.4–10.5)
CO2: 21 meq/L (ref 19–32)
Chloride: 110 mEq/L (ref 96–112)
Creatinine, Ser: 1.18 mg/dL — ABNORMAL HIGH (ref 0.50–1.10)
GFR calc non Af Amer: 52 mL/min — ABNORMAL LOW (ref >90)
GFR, EST AFRICAN AMERICAN: 61 mL/min — AB (ref >90)
Glucose, Bld: 146 mg/dL — ABNORMAL HIGH (ref 70–99)
POTASSIUM: 4.3 meq/L (ref 3.7–5.3)
SODIUM: 142 meq/L (ref 137–147)
TOTAL PROTEIN: 6 g/dL (ref 6.0–8.3)
Total Bilirubin: <0.2 mg/dL — ABNORMAL LOW (ref 0.3–1.2)

## 2013-07-18 LAB — CBC WITH DIFFERENTIAL/PLATELET
Basophils Absolute: 0 10*3/uL (ref 0.0–0.1)
Basophils Relative: 1 % (ref 0–1)
EOS ABS: 0.3 10*3/uL (ref 0.0–0.7)
EOS PCT: 4 % (ref 0–5)
HCT: 36.4 % (ref 36.0–46.0)
Hemoglobin: 12 g/dL (ref 12.0–15.0)
LYMPHS ABS: 2 10*3/uL (ref 0.7–4.0)
LYMPHS PCT: 25 % (ref 12–46)
MCH: 31.9 pg (ref 26.0–34.0)
MCHC: 33 g/dL (ref 30.0–36.0)
MCV: 96.8 fL (ref 78.0–100.0)
MONOS PCT: 8 % (ref 3–12)
Monocytes Absolute: 0.7 10*3/uL (ref 0.1–1.0)
Neutro Abs: 5 10*3/uL (ref 1.7–7.7)
Neutrophils Relative %: 62 % (ref 43–77)
Platelets: 207 10*3/uL (ref 150–400)
RBC: 3.76 MIL/uL — AB (ref 3.87–5.11)
RDW: 14.2 % (ref 11.5–15.5)
WBC: 8 10*3/uL (ref 4.0–10.5)

## 2013-07-18 LAB — GLUCOSE, CAPILLARY: Glucose-Capillary: 81 mg/dL (ref 70–99)

## 2013-07-18 LAB — MAGNESIUM: MAGNESIUM: 1.7 mg/dL (ref 1.5–2.5)

## 2013-07-18 MED ORDER — PANTOPRAZOLE SODIUM 40 MG PO TBEC
40.0000 mg | DELAYED_RELEASE_TABLET | Freq: Two times a day (BID) | ORAL | Status: DC
  Administered 2013-07-18 – 2013-07-20 (×4): 40 mg via ORAL
  Filled 2013-07-18 (×5): qty 1

## 2013-07-18 MED ORDER — METOPROLOL TARTRATE 25 MG PO TABS
25.0000 mg | ORAL_TABLET | Freq: Two times a day (BID) | ORAL | Status: DC
  Administered 2013-07-18 – 2013-07-19 (×2): 25 mg via ORAL
  Filled 2013-07-18 (×3): qty 1

## 2013-07-18 MED ORDER — METOPROLOL TARTRATE 25 MG PO TABS
25.0000 mg | ORAL_TABLET | Freq: Two times a day (BID) | ORAL | Status: DC
  Filled 2013-07-18: qty 1

## 2013-07-18 NOTE — Plan of Care (Signed)
Problem: Phase II Progression Outcomes Goal: Progress activity as tolerated unless otherwise ordered Outcome: Progressing Pt ambulates independently.  Goal: Vital signs remain stable Outcome: Progressing Pt vital signs are stable.

## 2013-07-18 NOTE — Progress Notes (Signed)
[facility] PROGRESS NOTE  [name] YSA:[id number] DOB: 1960-08-05 DOA: 07/16/2013 PCP: Elsie Stain, MD  Assessment/Plan:  Acute Colitis  -Noncontrast CT of the abdomen shows possible acute diverticulitis versus inflammatory/infectious colitis.  -Continue clear liquids. - Pain control PRN  Dilaudid. - Patient had colonoscopy in 2013 which showed diffuse diverticulosis and internal hemorrhoids. I do not see any results of the colonic mucosa biopsy done at that time. (Seen by [facility])  -Continue IV ciprofloxacin and Flagyl.  -Continue IV hydration with pain and antiemetics.  -She will need GI evaluation as outpatient unless symptoms worsen and has drop in her hemoglobin while in the hospital.  Clostridium difficile -Positive for C. difficile toxin A./B2/02/2014 -Continue metronidazole     Acute renal failure  -Multifactorial including dehydration, along with HCTZ in setting of lisinopril and [place]  - Likely has some underlying chronic kidney disease as well. She was admitted with acute kidney injury in August of 2014 as well and improved with IV fluids.  -Creatinine improving with hydration, renal ultrasound with cyst otherwise no hydronephrosis  -2/11 creatinine 1.18  Alcohol abuse  She previously reported drinking 2-3 times a week ( 2-3 drinks of vodka at a time).  -Continue CIWA, no evidence of withdrawal at this   Protein-calorie malnutrition, severe  nutrition consulted on admission   HTN (hypertension)  -Continue holding HCTZ and lisinopril.  -Start metoprolol 25 mg BID   History of antral ulcer  Increase Protonix to twice a day      Code Status: Full code  Family Communication: None at bedside  Disposition Plan: To home when medically    Consultants:    Procedures:   Antibiotics: Flagyl started on 2/9 >> cipro Started on 2/9>>     HPI/Subjective:   Objective: Filed Vitals:   07/17/13 2043 07/18/13 0612 07/18/13 1418 07/18/13 1649  BP:  138/77 147/70 178/88 182/82  Pulse: 91 79 82   Temp: 97.9 F (36.6 C) 98 F (36.7 C) 97.6 F (36.4 C)   TempSrc: Oral Oral Oral   Resp: 18 18    Height:      Weight:  48.3 kg (106 lb 7.7 oz)    SpO2: 100% 98% 99%     Intake/Output Summary (Last 24 hours) at 07/18/13 1740 Last data filed at 07/18/13 1418  Gross per 24 hour  Intake 3988.33 ml  Output   1150 ml  Net 2838.33 ml   Filed Weights   07/16/13 1953 07/17/13 0500 07/18/13 0612  Weight: 47.129 kg (103 lb 14.4 oz) 47.4 kg (104 lb 8 oz) 48.3 kg (106 lb 7.7 oz)    Exam:   General:  A./O. x4, NAD (patient actually drank a Coca-Cola to)  Cardiovascular: Reread, negative murmurs rubs gallops, DP/PT pulse one plus bilateral  Respiratory: To auscultation bilateral  Abdomen: Soft,LLQ tender to palpation, positive bowel sound  Musculoskeletal: Negative pedal edema   Data Reviewed: Basic Metabolic Panel:  Recent Labs Lab 07/16/13 1429 07/16/13 2010 07/17/13 0445  NA 138 137 139  K 5.0 4.0 4.2  CL 97 102 104  CO2 22 20 23   GLUCOSE 107* 126* 96  BUN 30* 25* 19  CREATININE 2.37* 1.90* 1.56*  CALCIUM 9.7 8.7 8.8  MG  --  1.7  --   PHOS  --  3.0  --    Liver Function Tests:  Recent Labs Lab 07/16/13 1429 07/16/13 2010 07/17/13 0445  AST 22 13 14   ALT 10 7 7   ALKPHOS 112  94 91  BILITOT 0.8 0.6 0.5  PROT 7.9 6.6 6.4  ALBUMIN 3.9 3.2* 3.1*    Recent Labs Lab 07/16/13 1429  LIPASE 28   No results found for this basename: AMMONIA,  in the last 168 hours CBC:  Recent Labs Lab 07/16/13 1429 07/16/13 2010 07/17/13 0445  WBC 15.5* 13.0* 11.0*  NEUTROABS 12.2* 9.6*  --   HGB 17.0* 13.9 13.4  HCT 50.1* 41.2 40.9  MCV 96.3 96.9 98.1  PLT 218 201 211   Cardiac Enzymes: No results found for this basename: CKTOTAL, CKMB, CKMBINDEX, TROPONINI,  in the last 168 hours BNP (last 3 results) No results found for this basename: PROBNP,  in the last 8760 hours CBG:  Recent Labs Lab 07/17/13 0737  07/18/13 0752  GLUCAP 96 81    No results found for this or any previous visit (from the past 240 hour(s)).   Studies: US Renal  07/17/2013   CLINICAL DATA:  Acute renal insufficiency.  Hypertension.  EXAM: RENAL/URINARY TRACT ULTRASOUND COMPLETE  COMPARISON:  CT abdomen pelvis without contrast 07/16/2013 and abdominal ultrasound 05/24/2010  FINDINGS: Right Kidney:  Length: 8 point 7 cm sagittal length. Echogenicity of the renal cortex is slightly increased compared to the adjacent liver. There is a 1.5 x 1.6 x 1.4 cm cyst in the upper pole the right kidney. Negative for hydronephrosis.  Left Kidney:  Length: 9.1 cm in sagittal length. Echogenicity appears within normal limits. Negative for mass or hydronephrosis.  Bladder:  Appears normal for degree of bladder distention.  IMPRESSION: 1. Negative for hydronephrosis. 2. Echogenicity of the right kidney appears slightly increased, which can be seen in the setting of chronic medical renal disease. 3. 1.6 cm cyst right kidney.   Electronically Signed   By: Curlene Dolphin M.D.   On: 07/17/2013 08:33    Scheduled Meds: . cholecalciferol  10,000 Units Oral Daily  . ciprofloxacin  400 mg Intravenous Q24H  . feeding supplement (RESOURCE BREEZE)  1 Container Oral TID BM  . folic acid  1 mg Oral Daily  . gabapentin  100 mg Oral QHS  . hydrALAZINE  10 mg Oral 3 times per day  . metronidazole  500 mg Intravenous Q8H  . multivitamin with minerals  1 tablet Oral Daily  . pantoprazole  40 mg Oral Daily  . thiamine  100 mg Oral Daily   Continuous Infusions: . sodium chloride 125 mL/hr at 07/18/13 1605    Principal Problem:   Colitis Active Problems:   Leukocytosis   Alcohol abuse   Protein-calorie malnutrition, severe   Acute renal failure   HTN (hypertension)   Acute diverticulitis    Time spent: 40 minutes    WOODS, CURTIS, J  [facility] Pager [phone]. If 7PM-7AM, please contact night-coverage at [email or web address], password  [facility] 07/18/2013, 5:40 PM  LOS: 2 days

## 2013-07-19 LAB — COMPREHENSIVE METABOLIC PANEL
ALT: 7 U/L (ref 0–35)
AST: 16 U/L (ref 0–37)
Albumin: 2.7 g/dL — ABNORMAL LOW (ref 3.5–5.2)
Alkaline Phosphatase: 75 U/L (ref 39–117)
BUN: 6 mg/dL (ref 6–23)
CALCIUM: 8.5 mg/dL (ref 8.4–10.5)
CO2: 19 mEq/L (ref 19–32)
CREATININE: 1.22 mg/dL — AB (ref 0.50–1.10)
Chloride: 112 mEq/L (ref 96–112)
GFR calc non Af Amer: 50 mL/min — ABNORMAL LOW (ref >90)
GFR, EST AFRICAN AMERICAN: 58 mL/min — AB (ref >90)
GLUCOSE: 100 mg/dL — AB (ref 70–99)
POTASSIUM: 4 meq/L (ref 3.7–5.3)
Sodium: 143 mEq/L (ref 137–147)
Total Bilirubin: 0.3 mg/dL (ref 0.3–1.2)
Total Protein: 5.1 g/dL — ABNORMAL LOW (ref 6.0–8.3)

## 2013-07-19 LAB — CBC WITH DIFFERENTIAL/PLATELET
BASOS ABS: 0 10*3/uL (ref 0.0–0.1)
Basophils Relative: 1 % (ref 0–1)
EOS PCT: 4 % (ref 0–5)
Eosinophils Absolute: 0.4 10*3/uL (ref 0.0–0.7)
HCT: 34 % — ABNORMAL LOW (ref 36.0–46.0)
HEMOGLOBIN: 11 g/dL — AB (ref 12.0–15.0)
LYMPHS ABS: 2 10*3/uL (ref 0.7–4.0)
LYMPHS PCT: 23 % (ref 12–46)
MCH: 31.4 pg (ref 26.0–34.0)
MCHC: 32.4 g/dL (ref 30.0–36.0)
MCV: 97.1 fL (ref 78.0–100.0)
MONO ABS: 0.9 10*3/uL (ref 0.1–1.0)
MONOS PCT: 10 % (ref 3–12)
NEUTROS ABS: 5.3 10*3/uL (ref 1.7–7.7)
Neutrophils Relative %: 62 % (ref 43–77)
Platelets: 198 10*3/uL (ref 150–400)
RBC: 3.5 MIL/uL — AB (ref 3.87–5.11)
RDW: 14.4 % (ref 11.5–15.5)
WBC: 8.6 10*3/uL (ref 4.0–10.5)

## 2013-07-19 LAB — LACTIC ACID, PLASMA: Lactic Acid, Venous: 0.5 mmol/L (ref 0.5–2.2)

## 2013-07-19 LAB — MAGNESIUM: Magnesium: 1.5 mg/dL (ref 1.5–2.5)

## 2013-07-19 LAB — GLUCOSE, CAPILLARY: Glucose-Capillary: 115 mg/dL — ABNORMAL HIGH (ref 70–99)

## 2013-07-19 MED ORDER — LABETALOL HCL 100 MG PO TABS
100.0000 mg | ORAL_TABLET | Freq: Two times a day (BID) | ORAL | Status: DC
  Administered 2013-07-19 – 2013-07-20 (×2): 100 mg via ORAL
  Filled 2013-07-19 (×3): qty 1

## 2013-07-19 MED ORDER — HYDRALAZINE HCL 20 MG/ML IJ SOLN
10.0000 mg | Freq: Once | INTRAMUSCULAR | Status: DC
  Filled 2013-07-19: qty 0.5

## 2013-07-19 MED ORDER — LABETALOL HCL 100 MG PO TABS: 100.0000 mg | ORAL_TABLET | Freq: Two times a day (BID) | ORAL | Status: DC

## 2013-07-19 MED ORDER — CIPROFLOXACIN IN D5W 400 MG/200ML IV SOLN
400.0000 mg | Freq: Two times a day (BID) | INTRAVENOUS | Status: DC
  Filled 2013-07-19: qty 200

## 2013-07-19 MED ORDER — HYDRALAZINE HCL 20 MG/ML IJ SOLN
10.0000 mg | Freq: Once | INTRAMUSCULAR | Status: AC
  Administered 2013-07-19: 10 mg via INTRAVENOUS
  Filled 2013-07-19: qty 0.5

## 2013-07-19 MED ORDER — CIPROFLOXACIN IN D5W 400 MG/200ML IV SOLN
400.0000 mg | INTRAVENOUS | Status: DC
  Administered 2013-07-19: 400 mg via INTRAVENOUS
  Filled 2013-07-19 (×2): qty 200

## 2013-07-19 NOTE — Progress Notes (Addendum)
ANTIBIOTIC CONSULT NOTE   Pharmacy Consult for Ciprofloxacin Indication: Colitis  No Known Allergies  Patient Measurements: Height: 4\' 10"  (147.3 cm) Weight: 110 lb (49.896 kg) IBW/kg (Calculated) : 40.9 Adjusted Body Weight:   Vital Signs: Temp: 97.5 F (36.4 C) (02/12 1327) Temp src: Oral (02/12 1327) BP: 176/81 mmHg (02/12 1224) Pulse Rate: 64 (02/12 1327) Intake/Output from previous day: 02/11 0701 - 02/12 0700 In: 2893.8 [I.V.:2893.8] Out: 1750 [Urine:1750] Intake/Output from this shift: Total I/O In: 480 [P.O.:480] Out: 450 [Urine:450]  Labs:  Recent Labs  07/16/13 1429 07/16/13 1437  07/17/13 0445 07/18/13 1820 07/19/13 0510  WBC 15.5*  --   < > 11.0* 8.0 8.6  HGB 17.0*  --   < > 13.4 12.0 11.0*  PLT 218  --   < > 211 207 198  LABCREA  --  150.6  --   --   --   --   CREATININE 2.37*  --   < > 1.56* 1.18* 1.22*  < > = values in this interval not displayed. Estimated Creatinine Clearance: 38.3 ml/min (by C-G formula based on Cr of 1.22).    Microbiology: No results found for this or any previous visit (from the past 720 hour(s)).  Medical History: Past Medical History  Diagnosis Date  . Hypertension   . Hypothyroidism   . Chronic kidney disease     stones  . GERD (gastroesophageal reflux disease)   . Arthritis     hands  . Diverticulitis     Assessment: 6 yoF with PMHx significant for HTN, hypothyroidism, CKD, GERD, and diverticulitis presented with abdominal pain. FOB+.  CT abd/pelvis showed colonic wall thickening with possible acute diverticulitis (inflammatory vs infectious colitis).  Pharmacy was consulted to dose ciprofloxacin for empiric colitis.  On concomitant metronidazole for anaerobic and C.diff coverage.  2/9 >> Metronidazole >> 2/9 >> Ciprofloxacin >>  2/12: D#4 Cipro 400mg  IV q24h + Flagyl 500mg  IV q8h. Afebrile, leukocytosis resolved SCr improved, CrCl now > 30 mL/min GI pathogen panel 2/9:  Positive for CDiff toxin  A/B  Goal of Therapy:  Eradication of infection  Plan:  1. Continue Cipro at present dosage (400mg  IV q24h). 2. Continues Flagyl 500mg  IV q8h as per MD order.  Clayburn Pert, PharmD, BCPS Pager: [phone] 07/19/2013  1:59 PM

## 2013-07-19 NOTE — Progress Notes (Signed)
[facility] PROGRESS NOTE  [name] DGL:[id number] DOB: 02/09/1961 DOA: 07/16/2013 PCP: Crawford Givens, MD  Assessment/Plan:  Acute Colitis  -Noncontrast CT of the abdomen shows possible acute diverticulitis versus inflammatory/infectious colitis.  -Continue clear liquids. - Pain control PRN  Dilaudid. - Patient had colonoscopy in 2013 which showed diffuse diverticulosis and internal hemorrhoids. I do not see any results of the colonic mucosa biopsy done at that time. (Seen by [facility])  -Continue IV ciprofloxacin and Flagyl.  -Continue IV hydration with pain and antiemetics.  -She will need GI evaluation as outpatient unless symptoms worsen and has drop in her hemoglobin while in the hospital. -GI symptoms improving advance diet to BRAT  Clostridium difficile -Positive for C. difficile toxin A./B  On 07/16/2013 -Continue metronidazole     Acute renal failure  -Multifactorial including dehydration, along with HCTZ in setting of lisinopril and [place]  - Likely has some underlying chronic kidney disease as well. She was admitted with acute kidney injury in August of 2014 as well and improved with IV fluids.  -Creatinine improving with hydration, renal ultrasound with cyst otherwise no hydronephrosis  -2/12 creatinine 1.22  Alcohol abuse  She previously reported drinking 2-3 times a week ( 2-3 drinks of vodka at a time).  -Continue CIWA, no evidence of withdrawal at this   Protein-calorie malnutrition, severe  nutrition consulted on admission   HTN (hypertension)  -Continue holding HCTZ and lisinopril.  -DC  metoprolol 25 mg BID start labetalol 100 mg BID   History of antral ulcer  Increase Protonix to twice a day      Code Status: Full code  Family Communication: None at bedside  Disposition Plan: To home when medically    Consultants:    Procedures:   Antibiotics: Flagyl started on 2/9 >> cipro Started on 2/9>>     HPI/Subjective: 53 yo WF PMHx   diverticulitis, chronic lower abdominal pain, hypertension, alcohol abuse and hypothyroidism who presented to the ED with worsening abdominal pain for past 2 days. Patient reports ongoing chronic abdominal pain over her right lower quadrant for almost 3 years. She was admitted in 2013 for acute abdominal pain and was found to have diverticulitis. She had colonoscopy done at that time which showed multiple diverticuli and internal hemorrhoids. and followed by endoscopy which showed antral gastric ulcer.  Patient reports that for past 2 days she has worsening over abdominal pain primarily over the right lower quadrant radiating to the right upper quadrant, which crampy in nature. This was also associated with nausea but no vomiting. This morning she started having pain over her left lower quadrant radiating to the back and was more severe. She also noticed dark stools since this morning. He denies any diarrhea or constipation. Denies any recent travel or sick contacts. Denies eating anything outside. He reports poor appetite for past few days and because of nausea has not been able to eat well.  Course in the ED patient was afebrile, mildly tachycardic to 103, respiratory rate of 18, blood pressure of 93/51 mmHg and O2 sat of 98% on room air  Blood work showed leukocytosis with WBC of 15.5. Hemoglobin of 17 and HCT of 50. Platelets of 218. Chemistry showed sodium of 138, K of 5, chloride 97, CO2 of 22, O2 kidney into the with BUN of 30 and creatinine of 2.37, glucose of 107. 2/11 patient states negative N./V., one episode of nonbloody diarrhea (patient drinking Coca-Cola). 12/2 patient has tolerated full liquid diet with  negative N./V./D., negative BM, plus flatus.    Objective: Filed Vitals:   07/19/13 0525 07/19/13 1036 07/19/13 1224 07/19/13 1327  BP: 153/78 160/80 176/81   Pulse: 79 78 74 64  Temp: 97.9 F (36.6 C)   97.5 F (36.4 C)  TempSrc: Oral   Oral  Resp: 18   16  Height:      Weight:  49.896 kg (110 lb)     SpO2: 96%   99%    Intake/Output Summary (Last 24 hours) at 07/19/13 1724 Last data filed at 07/19/13 1327  Gross per 24 hour  Intake 2371.67 ml  Output   1800 ml  Net 571.67 ml   Filed Weights   07/17/13 0500 07/18/13 0612 07/19/13 0525  Weight: 47.4 kg (104 lb 8 oz) 48.3 kg (106 lb 7.7 oz) 49.896 kg (110 lb)    Exam:   General:  A./O. x4, NAD (patient actually drank a Coca-Cola to)  Cardiovascular: Reread, negative murmurs rubs gallops, DP/PT pulse one plus bilateral  Respiratory: To auscultation bilateral  Abdomen: Soft, nontender, nondistended, positive bowel sound  Musculoskeletal: Negative pedal edema   Data Reviewed: Basic Metabolic Panel:  Recent Labs Lab 07/16/13 1429 07/16/13 2010 07/17/13 0445 07/18/13 1820 07/19/13 0510  NA 138 137 139 142 143  K 5.0 4.0 4.2 4.3 4.0  CL 97 102 104 110 112  CO2 22 20 23 21 19   GLUCOSE 107* 126* 96 146* 100*  BUN 30* 25* 19 7 6   CREATININE 2.37* 1.90* 1.56* 1.18* 1.22*  CALCIUM 9.7 8.7 8.8 9.1 8.5  MG  --  1.7  --  1.7 1.5  PHOS  --  3.0  --   --   --    Liver Function Tests:  Recent Labs Lab 07/16/13 1429 07/16/13 2010 07/17/13 0445 07/18/13 1820 07/19/13 0510  AST 22 13 14 15 16   ALT 10 7 7 7 7   ALKPHOS 112 94 91 93 75  BILITOT 0.8 0.6 0.5 <0.2* 0.3  PROT 7.9 6.6 6.4 6.0 5.1*  ALBUMIN 3.9 3.2* 3.1* 3.1* 2.7*    Recent Labs Lab 07/16/13 1429  LIPASE 28   No results found for this basename: AMMONIA,  in the last 168 hours CBC:  Recent Labs Lab 07/16/13 1429 07/16/13 2010 07/17/13 0445 07/18/13 1820 07/19/13 0510  WBC 15.5* 13.0* 11.0* 8.0 8.6  NEUTROABS 12.2* 9.6*  --  5.0 5.3  HGB 17.0* 13.9 13.4 12.0 11.0*  HCT 50.1* 41.2 40.9 36.4 34.0*  MCV 96.3 96.9 98.1 96.8 97.1  PLT 218 201 211 207 198   Cardiac Enzymes: No results found for this basename: CKTOTAL, CKMB, CKMBINDEX, TROPONINI,  in the last 168 hours BNP (last 3 results) No results found for this  basename: PROBNP,  in the last 8760 hours CBG:  Recent Labs Lab 07/17/13 0737 07/18/13 0752 07/19/13 0802  GLUCAP 96 81 115*    No results found for this or any previous visit (from the past 240 hour(s)).   Studies: No results found.  Scheduled Meds: . cholecalciferol  10,000 Units Oral Daily  . ciprofloxacin  400 mg Intravenous Q24H  . feeding supplement (RESOURCE BREEZE)  1 Container Oral TID BM  . folic acid  1 mg Oral Daily  . gabapentin  100 mg Oral QHS  . hydrALAZINE  10 mg Oral 3 times per day  . metoprolol tartrate  25 mg Oral BID  . metronidazole  500 mg Intravenous Q8H  . multivitamin with minerals  1 tablet Oral Daily  . pantoprazole  40 mg Oral BID  . thiamine  100 mg Oral Daily   Continuous Infusions: . sodium chloride 125 mL/hr at 07/19/13 1324    Principal Problem:   Colitis Active Problems:   Leukocytosis   Alcohol abuse   Protein-calorie malnutrition, severe   Acute renal failure   HTN (hypertension)   Acute diverticulitis    Time spent: 40 minutes    Haseeb Fiallos, J  [facility] Pager [phone]. If 7PM-7AM, please contact night-coverage at [email or web address], password [facility] 07/19/2013, 5:24 PM  LOS: 3 days

## 2013-07-20 LAB — COMPREHENSIVE METABOLIC PANEL
ALT: 8 U/L (ref 0–35)
AST: 16 U/L (ref 0–37)
Albumin: 2.9 g/dL — ABNORMAL LOW (ref 3.5–5.2)
Alkaline Phosphatase: 78 U/L (ref 39–117)
BUN: 10 mg/dL (ref 6–23)
CO2: 16 mEq/L — ABNORMAL LOW (ref 19–32)
Calcium: 8.8 mg/dL (ref 8.4–10.5)
Chloride: 111 mEq/L (ref 96–112)
Creatinine, Ser: 1.23 mg/dL — ABNORMAL HIGH (ref 0.50–1.10)
GFR calc Af Amer: 58 mL/min — ABNORMAL LOW (ref >90)
GFR calc non Af Amer: 50 mL/min — ABNORMAL LOW (ref >90)
Glucose, Bld: 92 mg/dL (ref 70–99)
POTASSIUM: 4.2 meq/L (ref 3.7–5.3)
SODIUM: 142 meq/L (ref 137–147)
TOTAL PROTEIN: 5.4 g/dL — AB (ref 6.0–8.3)
Total Bilirubin: 0.2 mg/dL — ABNORMAL LOW (ref 0.3–1.2)

## 2013-07-20 LAB — CBC WITH DIFFERENTIAL/PLATELET
BASOS ABS: 0.1 10*3/uL (ref 0.0–0.1)
BASOS PCT: 1 % (ref 0–1)
Eosinophils Absolute: 0.4 10*3/uL (ref 0.0–0.7)
Eosinophils Relative: 4 % (ref 0–5)
HEMATOCRIT: 35.7 % — AB (ref 36.0–46.0)
Hemoglobin: 12 g/dL (ref 12.0–15.0)
Lymphocytes Relative: 20 % (ref 12–46)
Lymphs Abs: 1.9 10*3/uL (ref 0.7–4.0)
MCH: 32.3 pg (ref 26.0–34.0)
MCHC: 33.6 g/dL (ref 30.0–36.0)
MCV: 96 fL (ref 78.0–100.0)
MONO ABS: 0.9 10*3/uL (ref 0.1–1.0)
Monocytes Relative: 10 % (ref 3–12)
Neutro Abs: 6.1 10*3/uL (ref 1.7–7.7)
Neutrophils Relative %: 65 % (ref 43–77)
Platelets: 201 10*3/uL (ref 150–400)
RBC: 3.72 MIL/uL — ABNORMAL LOW (ref 3.87–5.11)
RDW: 14.5 % (ref 11.5–15.5)
WBC: 9.3 10*3/uL (ref 4.0–10.5)

## 2013-07-20 LAB — GLUCOSE, CAPILLARY: Glucose-Capillary: 116 mg/dL — ABNORMAL HIGH (ref 70–99)

## 2013-07-20 LAB — MAGNESIUM: Magnesium: 1.5 mg/dL (ref 1.5–2.5)

## 2013-07-20 MED ORDER — BUPROPION HCL ER (SR) 150 MG PO TB12: 150.0000 mg | ORAL_TABLET | Freq: Every day | ORAL | Status: DC

## 2013-07-20 MED ORDER — CIPROFLOXACIN HCL 500 MG PO TABS
500.0000 mg | ORAL_TABLET | Freq: Two times a day (BID) | ORAL | Status: DC
  Filled 2013-07-20 (×2): qty 1

## 2013-07-20 MED ORDER — BUPROPION HCL ER (SR) 150 MG PO TB12
150.0000 mg | ORAL_TABLET | Freq: Every day | ORAL | Status: DC
  Filled 2013-07-20: qty 1

## 2013-07-20 MED ORDER — METRONIDAZOLE 500 MG PO TABS
500.0000 mg | ORAL_TABLET | Freq: Three times a day (TID) | ORAL | Status: DC
Start: 2013-07-20 — End: 2014-12-18

## 2013-07-20 MED ORDER — HYDRALAZINE HCL 10 MG PO TABS: 10.0000 mg | ORAL_TABLET | Freq: Three times a day (TID) | ORAL | Status: DC

## 2013-07-20 MED ORDER — LABETALOL HCL 100 MG PO TABS: 100.0000 mg | ORAL_TABLET | Freq: Two times a day (BID) | ORAL | Status: DC

## 2013-07-20 MED ORDER — CIPROFLOXACIN HCL 500 MG PO TABS
500.0000 mg | ORAL_TABLET | Freq: Two times a day (BID) | ORAL | Status: DC
Start: 2013-07-20 — End: 2014-12-18

## 2013-07-20 MED ORDER — PANTOPRAZOLE SODIUM 40 MG PO TBEC: 40.0000 mg | DELAYED_RELEASE_TABLET | Freq: Two times a day (BID) | ORAL | Status: DC

## 2013-07-20 MED ORDER — METRONIDAZOLE 500 MG PO TABS
500.0000 mg | ORAL_TABLET | Freq: Three times a day (TID) | ORAL | Status: DC
  Filled 2013-07-20 (×3): qty 1

## 2013-07-20 NOTE — Discharge Summary (Signed)
Physician Discharge Summary  [name] [id number] DOB: Aug 22, 1960 DOA: 07/16/2013  PCP: Maria Stain, MD  Admit date: 07/16/2013 Discharge date: 07/20/2013  Time spent: 40 minutes  Recommendations for Outpatient Follow-up:   Acute Colitis  -Noncontrast CT of the abdomen shows possible acute diverticulitis versus inflammatory/infectious colitis.  -Continue clear liquids.  - Pain control PRN Dilaudid.  - Patient had colonoscopy in 2013 which showed diffuse diverticulosis and internal hemorrhoids. I do not see any results of the colonic mucosa biopsy done at that time. (Seen by [facility])  -Continue Ciprofloxacin 500 mg BID for 10 additional days from discharge to complete 14 day course -Flagyl. 500 mg TID for 10 additional days from discharge to complete 14 day course -Patient tolerated  Regular diet today breakfast/lunch without adverse signs or symptoms. Stable for discharge  -Followup with [facility] in 2 weeks    Clostridium difficile  -Positive for C. difficile toxin A./B On 07/16/2013  -Patient on metronidazole for colitis Will also treat her for C. difficile.    Acute renal failure  -Multifactorial including dehydration, HCTZ and lisinopril (would not restart; BP control with other agents)  - Likely has some underlying chronic kidney disease as well. She was admitted with acute kidney injury in August of 2014 as well and improved with IV fluids.  -Creatinine improving with hydration, renal ultrasound with cyst otherwise no hydronephrosis  -2/13 creatinine 1.23   Alcohol abuse  She previously reported drinking 2-3 times a week ( 2-3 drinks of vodka at a time).  -Continue CIWA, no evidence of withdrawal at this -Counseled patient and husband: Need to not restart alcohol   Protein-calorie malnutrition, severe  nutrition consulted on admission   HTN (hypertension)  -Do not restart HCTZ and lisinopril.  -Discharge on labetalol 100 mg BID  -Discharge on hydralazine 10 mg  TID  History of antral ulcer  - discharge on Protonix 40 mg BID  Nicotine dependence -Patient understands the sequela of continue to smoke and requests help in stopping. -Discharge on Wellbutrin    Discharge Diagnoses:  Principal Problem:   Colitis Active Problems:   Leukocytosis   Alcohol abuse   Protein-calorie malnutrition, severe   Acute renal failure   HTN (hypertension)   Acute diverticulitis   Discharge Condition: Stable  Diet recommendation: Heart healthy  Filed Weights   07/18/13 0612 07/19/13 0525 07/20/13 0635  Weight: 48.3 kg (106 lb 7.7 oz) 49.896 kg (110 lb) 51.3 kg (113 lb 1.5 oz)    History of present illness:  53 yo WF PMHx diverticulitis, chronic lower abdominal pain, hypertension, alcohol abuse and hypothyroidism who presented to the ED with worsening abdominal pain for past 2 days. Patient reports ongoing chronic abdominal pain over her right lower quadrant for almost 3 years. She was admitted in 2013 for acute abdominal pain and was found to have diverticulitis. She had colonoscopy done at that time which showed multiple diverticuli and internal hemorrhoids. and followed by endoscopy which showed antral gastric ulcer.  Patient reports that for past 2 days she has worsening over abdominal pain primarily over the right lower quadrant radiating to the right upper quadrant, which crampy in nature. This was also associated with nausea but no vomiting. This morning she started having pain over her left lower quadrant radiating to the back and was more severe. She also noticed dark stools since this morning. He denies any diarrhea or constipation. Denies any recent travel or sick contacts. Denies eating anything outside. He reports poor  appetite for past few days and because of nausea has not been able to eat well.  Course in the ED patient was afebrile, mildly tachycardic to 103, respiratory rate of 18, blood pressure of 93/51 mmHg and O2 sat of 98% on room air   Blood work showed leukocytosis with WBC of 15.5. Hemoglobin of 17 and HCT of 50. Platelets of 218. Chemistry showed sodium of 138, K of 5, chloride 97, CO2 of 22, O2 kidney into the with BUN of 30 and creatinine of 2.37, glucose of 107. 2/11 patient states negative N./V., one episode of nonbloody diarrhea (patient drinking Coca[employer]). 12/2 patient has tolerated full liquid diet with negative N./V./D., negative BM, plus flatus. 12/13 she has eaten to regular diet meals without N./V./D., negative abdominal pain or discomfort states she is ready for  Consultants:    Procedures:    Antibiotics:  IV Flagyl started on 2/9 >> stopped 2/13 IV cipro Started on 2/9>> stopped 2/13    Discharge Exam: Filed Vitals:   07/19/13 2300 07/20/13 0100 07/20/13 0635 07/20/13 1000  BP: 118/64 105/55 136/71 145/82  Pulse: 77 97 100 92  Temp:   98.7 F (37.1 C)   TempSrc:   Oral   Resp:   16   Height:      Weight:   51.3 kg (113 lb 1.5 oz)   SpO2:   98%    General: A./O. x4, NAD  Cardiovascular: Regular rhythm and rate, negative murmurs rubs gallops, DP/PT +1 bilateral Respiratory: Clear to auscultation bilateral Abdomen: Soft, nontender, nondistended, positive bowel sound  Musculoskeletal: Negative pedal edema    Discharge Instructions     Medication List    ASK your doctor about these medications       diclofenac 75 MG EC tablet  Commonly known as:  VOLTAREN  Take 140 mg by mouth 2 (two) times daily.     gabapentin 100 MG capsule  Commonly known as:  NEURONTIN  Take 100 mg by mouth at bedtime.     hydrochlorothiazide 25 MG tablet  Commonly known as:  HYDRODIURIL  Take 50 mg by mouth at bedtime.     lisinopril 20 MG tablet  Commonly known as:  PRINIVIL,ZESTRIL  Take 20 mg by mouth daily.     omeprazole 20 MG capsule  Commonly known as:  PRILOSEC  Take 40 mg by mouth 2 (two) times daily.     ondansetron 4 MG tablet  Commonly known as:  ZOFRAN  Take 4 mg by mouth every 4  (four) hours as needed for nausea or vomiting.     temazepam 15 MG capsule  Commonly known as:  RESTORIL  Take 15 mg by mouth at bedtime as needed for sleep.     tiZANidine 4 MG tablet  Commonly known as:  ZANAFLEX  Take 2-4 mg by mouth at bedtime as needed for muscle spasms.     Vitamin D-3 5000 UNITS Tabs  Take 10,000 Units by mouth daily after breakfast.       No Known Allergies    The results of significant diagnostics from this hospitalization (including imaging, microbiology, ancillary and laboratory) are listed below for reference.    Significant Diagnostic Studies: Ct Abdomen Pelvis Wo Contrast  07/16/2013   CLINICAL DATA:  Pt states she has had lower abd pain for a year. Pt states it has gotten worse in last few days. Pt has hx of diverticulosis with perfusion. Pt has already had appendix and gallbladder removed. Pt feels  nauseated, but no emesis. PT states she has had diarrhea.  EXAM: CT ABDOMEN AND PELVIS WITHOUT CONTRAST  TECHNIQUE: Multidetector CT imaging of the abdomen and pelvis was performed following the standard protocol without intravenous contrast.  COMPARISON:  01/10/2013  FINDINGS: Lung bases are unremarkable in appearance. Previous cholecystectomy. No focal abnormality identified within the liver, spleen, pancreas, adrenal glands, or left kidney. Low density lesion in the mid pole of the right kidney likely represents a cyst and measures 1.9 cm in diameter.  The stomach and small bowel loops are normal in appearance.  The ascending colon and hepatic flexure are notable for wall thickening and significant stranding. Scattered diverticula are identified within this segment of colon. The findings are most consistent with acute diverticulitis. Numerous colonic diverticula are identified throughout the length of the colon. The sigmoid colon show significant diverticulosis with no evidence for acute diverticulitis. No abscess or free intraperitoneal air. Small mesenteric lymph  nodes are present.  The uterus is absent.  No adnexal mass identified.  IMPRESSION: 1. Colonic wall thickening and stranding in the ascending colon- hepatic flexure region. The location is less typical for diverticulitis. However, the patient does have numerous diverticula in this region. Given the history of diverticulitis, acute diverticulitis is certainly the most likely etiology. Other considerations would include inflammatory/infectious colitis. Ischemic colitis would be considered less likely. Given the long-standing symptoms and the location, neoplasm should also be considered. 2. No abscess or free intraperitoneal air.   Electronically Signed   By: Shon Hale M.D.   On: 07/16/2013 17:12   US Renal  07/17/2013   CLINICAL DATA:  Acute renal insufficiency.  Hypertension.  EXAM: RENAL/URINARY TRACT ULTRASOUND COMPLETE  COMPARISON:  CT abdomen pelvis without contrast 07/16/2013 and abdominal ultrasound 05/24/2010  FINDINGS: Right Kidney:  Length: 8 point 7 cm sagittal length. Echogenicity of the renal cortex is slightly increased compared to the adjacent liver. There is a 1.5 x 1.6 x 1.4 cm cyst in the upper pole the right kidney. Negative for hydronephrosis.  Left Kidney:  Length: 9.1 cm in sagittal length. Echogenicity appears within normal limits. Negative for mass or hydronephrosis.  Bladder:  Appears normal for degree of bladder distention.  IMPRESSION: 1. Negative for hydronephrosis. 2. Echogenicity of the right kidney appears slightly increased, which can be seen in the setting of chronic medical renal disease. 3. 1.6 cm cyst right kidney.   Electronically Signed   By: Curlene Dolphin M.D.   On: 07/17/2013 08:33    Microbiology: No results found for this or any previous visit (from the past 240 hour(s)).   Labs: Basic Metabolic Panel:  Recent Labs Lab 07/16/13 1429 07/16/13 2010 07/17/13 0445 07/18/13 1820 07/19/13 0510 07/20/13 0505  NA 138 137 139 142 143 142  K 5.0 4.0 4.2 4.3 4.0  4.2  CL 97 102 104 110 112 111  CO2 22 20 23 21 19  16*  GLUCOSE 107* 126* 96 146* 100* 92  BUN 30* 25* 19 7 6 10   CREATININE 2.37* 1.90* 1.56* 1.18* 1.22* 1.23*  CALCIUM 9.7 8.7 8.8 9.1 8.5 8.8  MG  --  1.7  --  1.7 1.5 1.5  PHOS  --  3.0  --   --   --   --    Liver Function Tests:  Recent Labs Lab 07/16/13 2010 07/17/13 0445 07/18/13 1820 07/19/13 0510 07/20/13 0505  AST 13 14 15 16 16   ALT 7 7 7 7 8   ALKPHOS 94 91 93  75 78  BILITOT 0.6 0.5 <0.2* 0.3 0.2*  PROT 6.6 6.4 6.0 5.1* 5.4*  ALBUMIN 3.2* 3.1* 3.1* 2.7* 2.9*    Recent Labs Lab 07/16/13 1429  LIPASE 28   No results found for this basename: AMMONIA,  in the last 168 hours CBC:  Recent Labs Lab 07/16/13 1429 07/16/13 2010 07/17/13 0445 07/18/13 1820 07/19/13 0510 07/20/13 0505  WBC 15.5* 13.0* 11.0* 8.0 8.6 9.3  NEUTROABS 12.2* 9.6*  --  5.0 5.3 6.1  HGB 17.0* 13.9 13.4 12.0 11.0* 12.0  HCT 50.1* 41.2 40.9 36.4 34.0* 35.7*  MCV 96.3 96.9 98.1 96.8 97.1 96.0  PLT 218 201 211 207 198 201   Cardiac Enzymes: No results found for this basename: CKTOTAL, CKMB, CKMBINDEX, TROPONINI,  in the last 168 hours BNP: BNP (last 3 results) No results found for this basename: PROBNP,  in the last 8760 hours CBG:  Recent Labs Lab 07/17/13 0737 07/18/13 0752 07/19/13 0802 07/20/13 1032  GLUCAP 96 81 115* 116*       Signed:  Dia Crawford, MD [facility] [phone] pager

## 2013-09-26 ENCOUNTER — Other Ambulatory Visit: Payer: Self-pay | Admitting: Gastroenterology

## 2014-04-08 ENCOUNTER — Encounter (HOSPITAL_COMMUNITY): Payer: Self-pay | Admitting: Emergency Medicine

## 2014-06-09 ENCOUNTER — Inpatient Hospital Stay: Payer: Self-pay | Admitting: Internal Medicine

## 2014-06-09 LAB — TROPONIN I: Troponin-I: <0.02 ng/mL

## 2014-06-09 LAB — CBC
HCT: 50.5 % — ABNORMAL HIGH (ref 35.0–47.0)
HGB: 16.9 g/dL — ABNORMAL HIGH (ref 12.0–16.0)
MCH: 35.3 pg — AB (ref 26.0–34.0)
MCHC: 33.4 g/dL (ref 32.0–36.0)
MCV: 106 fL — ABNORMAL HIGH (ref 80–100)
PLATELETS: 124 10*3/uL — AB (ref 150–440)
RBC: 4.79 10*6/uL (ref 3.80–5.20)
RDW: 13.7 % (ref 11.5–14.5)
WBC: 6.2 10*3/uL (ref 3.6–11.0)

## 2014-06-09 LAB — BASIC METABOLIC PANEL
Anion Gap: 8 (ref 7–16)
BUN: 7 mg/dL (ref 7–18)
Calcium, Total: 9 mg/dL (ref 8.5–10.1)
Chloride: 103 mmol/L (ref 98–107)
Co2: 27 mmol/L (ref 21–32)
Creatinine: 1.2 mg/dL (ref 0.60–1.30)
GFR CALC NON AF AMER: 50 — AB
Glucose: 100 mg/dL — ABNORMAL HIGH (ref 65–99)
Osmolality: 274 (ref 275–301)
Potassium: 3.9 mmol/L (ref 3.5–5.1)
SODIUM: 138 mmol/L (ref 136–145)

## 2014-06-20 ENCOUNTER — Encounter (HOSPITAL_COMMUNITY): Payer: Self-pay | Admitting: Gastroenterology

## 2014-09-27 NOTE — Consult Note (Signed)
PATIENT NAME:  [name], [name] MR#:  [id number] DATE OF BIRTH:  Nov 26, 1960  DATE OF CONSULTATION:  12/28/2012  REFERRING PHYSICIAN:  Felipa Furnace, MD   CONSULTING PHYSICIAN:  Dow Adolph, MD/Masyn Rostro M. Lorree Millar, PA-C  REASON FOR CONSULTATION: Possible GI bleed, a history of alcohol abuse with chest pain, bright red blood per rectum and vomiting.   HISTORY OF PRESENT ILLNESS: This is a pleasant 54 year old female who presents with concerns of a GI bleed and vomiting. She states that beginning last night she went to use the restroom and noticed bright red blood per rectum with accompanying watery stools. Then beginning this morning at work around 7:00 a.m., she developed sudden onset chest pain. She did have some concerns that this is cardiac in nature and decided to come into the ER. Thus far, a cardiac work-up has been negative. She does report a history of alcohol abuse and has continued to have persistent vomiting throughout the day. She has been unable to tolerate anything p.o. Per report, the vomiting has been nonbloody. Blood per rectum does appear to be bright red with accompanying loose watery stools. When speaking with the patient, she denies any melena. There has been no black, sticky, tarry-appearing stools that she has noticed. She denies any abdominal pain outside of up in the chest in the upper epigastric region. Hemoglobin was stable at 17.7 with an MCV of 105, and LFTs were within normal limits. Currently, her vital signs are stable as well. Chest x-ray was negative, and an MRI angiogram of the thoracic and abdominal aorta was obtained and was negative for any dissection or any acute abnormality. She does have a history of alcohol use and abuse but states that she has not had a drink for the past 6 weeks. She has history of drinking moonshine. She does report seeing Dr. Niel Hummer in the past, approximately 1 year ago in August 2013 for a GI bleed during which time she underwent an EGD  and a colonoscopy. Per the patient, it was notable for diverticulosis and 2 colon polyps but was otherwise unremarkable. She states then she followed up with Dr. Niel Hummer just 2 to 3 months ago where they repeated the EGD and colonoscopy. When asked about findings or any signs of source of GI bleed or esophageal varices, she does not believe that they have been able to find anything. No unintentional weight changes. No shortness of breath. No fever or chills. No recent NSAID use.   PAST MEDICAL HISTORY: Hypertension and diverticulosis.   PAST SURGICAL HISTORY: Cholecystectomy, total hysterectomy and appendectomy.   ALLERGIES: No known drug allergies.  SOCIAL HISTORY: The patient reported daily tobacco use. She denies any illicit drug use. She does have a history of alcohol use and abuse but states she has not had a drink for 6 weeks.   FAMILY HISTORY: Father had coronary artery disease. Mother passed away of cancer, but the patient was unsure of what the primary was but suspected that she had cancer involvement in the throat, breast and stomach.   REVIEW OF SYSTEMS: A 10-system review was obtained on the patient. Pertinent positives are mentioned above and otherwise negative.   PHYSICAL EXAMINATION:  VITAL SIGNS: Blood pressure 124/75, heart rate 97.  GENERAL: This is a pleasant 54 year old female seen in the Emergency Department in no acute distress resting quietly and comfortably in bed, alert and oriented x 3.  HEENT: Atraumatic, normocephalic. Sclerae anicteric. Mucous membranes moist.  NECK: Supple. No lymphadenopathy noted.  PULMONARY: Respirations  are even and unlabored, clear to auscultation bilateral anterior lung fields.  CARDIAC: Regular rate and rhythm. S1, S2 noted.  ABDOMEN: Soft, nontender, nondistended. Normoactive bowel sounds noted in all 4 quadrants. No masses palpated.  RECTAL: Deferred. ER physician performed rectal exam notable for a large amount of blood per rectum.   PSYCHIATRIC: Appropriate mood and affect.  EXTREMITIES: Negative for lower extremity edema, 2+ pulses bilaterally.   LABORATORY DATA: White blood cells 12.8, hemoglobin 17.7, hematocrit 50.8, MCV 105, platelets 246. Sodium 137, potassium 4.2, BUN 25, creatinine 1.90, glucose 103. Urinalysis negative. Troponins were negative. Lactic acid 0.7. LFTs are within normal limits. D-dimer is 0.82.   IMAGING STUDIES:  MRI angiogram of the thoracic and abdominal aorta was negative for dissection. No evidence of acute abnormality.  Chest x-ray was obtained on the patient and showed no acute cardiopulmonary process.   ASSESSMENT: 1.  GI bleed described as bright red blood per rectum.  2.  Chest pain, sudden onset beginning this morning.  3.  Intractable vomiting, inability to tolerate anything p.o.  4.  History of alcohol use and abuse. Per the patient, she states she has not had a sip of alcohol for the past 6 weeks.   PLAN: I have discussed this patient's case in detail with Dr. Dow Adolph, who is involved in the development of the patient's plan of care. Because the patient states that she did have an EGD and a colonoscopy just 2 to 3 months ago by Dr. Niel Hummer, I will obtain a medical records release form and obtain these report so we can evaluate what these revealed and to see whether or not she has had esophageal varices, particularly because of her history of alcohol use and abuse. Currently, her vital signs are stable, and her hemoglobin is stable and, therefore, we do recommend rechecking a hemoglobin in the morning. Continue PPI drip for now. Antiemetics p.r.n. The patient may or may not benefit from repeat endoscopic intervention. However, this decision will be made pending review of her previous EGD and colonoscopy by Dr. Niel Hummer. The above was discussed in detail with the patient who verbalized understanding, and all questions were answered.   Thank you so much for this consultation and for  allowing [place] to participate in the patient's plan of care.    ____________________________ Hardie Shackleton. Keonta Monceaux, PA-C kme:cb D: 12/28/2012 17:08:13 ET T: 12/28/2012 17:20:12 ET JOB#: [id number]  cc: Hardie Shackleton. Deloma Spindle, PA-C, <Dictator> Hardie Shackleton Daeja Helderman PA ELECTRONICALLY SIGNED 12/29/2012 12:43

## 2014-09-27 NOTE — Consult Note (Signed)
Chief Complaint:  Subjective/Chief Complaint Cross cover for Dr. Rayann Heman. Patient with one bowel movement this am. Feeling much better. No new complaints.   VITAL SIGNS/ANCILLARY NOTES: **Vital Signs.:   25-Oct-14 05:06  Vital Signs Type Routine  Temperature Temperature (F) 98.2  Celsius 36.7  Temperature Source oral  Pulse Pulse 73  Respirations Respirations 17  Systolic BP Systolic BP 161  Diastolic BP (mmHg) Diastolic BP (mmHg) 78  Mean BP 101  Pulse Ox % Pulse Ox % 96  Pulse Ox Activity Level  At rest  Oxygen Delivery Room Air/ 21 %  *Intake and Output.:   Daily 25-Oct-14 07:00  Grand Totals Intake:  2089 Output:  950    Net:  70 24 Hr.:  1139  Oral Intake      In:  200  IV (Primary)      In:  1889  Urine ml     Out:  950  Length of Stay Totals Intake:  3520 Output:  1940    Net:  0960   Brief Assessment:  GEN well developed, well nourished   Respiratory normal resp effort   Additional Physical Exam Alert and orientated times 3   Lab Results: Routine Chem:  24-Oct-14 04:40   Glucose, Serum 93  BUN 16  Creatinine (comp)  1.57  Sodium, Serum  147  Potassium, Serum 4.2  Chloride, Serum  122  CO2, Serum  18  Calcium (Total), Serum  8.3  Anion Gap 7  Osmolality (calc) 293  eGFR (African American)  44  eGFR (Non-African American)  38 (eGFR values <39m/min/1.73 m2 may be an indication of chronic kidney disease (CKD). Calculated eGFR is useful in patients with stable renal function. The eGFR calculation will not be reliable in acutely ill patients when serum creatinine is changing rapidly. It is not useful in  patients on dialysis. The eGFR calculation may not be applicable to patients at the low and high extremes of body sizes, pregnant women, and vegetarians.)  Routine Hem:  24-Oct-14 04:40   WBC (CBC) 8.9  RBC (CBC)  3.18  Hemoglobin (CBC)  11.1  Hematocrit (CBC)  31.5  Platelet Count (CBC) 220  MCV 99  MCH  34.8  MCHC 35.1  RDW 13.3   Neutrophil % 65.2  Lymphocyte % 20.8  Monocyte % 8.2  Eosinophil % 4.8  Basophil % 1.0  Neutrophil # 5.8  Lymphocyte # 1.8  Monocyte # 0.7  Eosinophil # 0.4  Basophil # 0.1 (Result(s) reported on 30 Mar 2013 at 05:43AM.)   Assessment/Plan:  Assessment/Plan:  Assessment C. Diff colitis.   Plan Patient progressing well. Dr. RRayann Hemanwill resume care monday. Call if any concerns this weekend.   Electronic Signatures: WLucilla Lame(MD)  (Signed 25-Oct-14 10:49)  Authored: Chief Complaint, VITAL SIGNS/ANCILLARY NOTES, Brief Assessment, Lab Results, Assessment/Plan   Last Updated: 25-Oct-14 10:49 by WLucilla Lame(MD)

## 2014-09-27 NOTE — Consult Note (Signed)
PATIENT NAME:  [name], [name] MR#:  [id number] DATE OF BIRTH:  1961/03/08  DATE OF CONSULTATION:  03/29/2013  REFERRING PHYSICIAN:  Valentino Nose, MD CONSULTING PHYSICIAN:  Arther Dames, MD  REASON FOR CONSULT: Nausea, vomiting, diarrhea.   HISTORY OF PRESENT ILLNESS: [name] is a 54 year old female with a past medical history notable for chronic abdominal pain and hypertension who is admitted to the hospital for management of nausea, vomiting and diarrhea.   [name] was seen in my GI clinic on the day prior. At that time she had reported 2 weeks of diarrhea. She also had some worsening in her nausea and vomiting and felt as though she continue to lose weight.   In the clinic, orthostatic vitals were tested and were strongly positive for volume depletion. Based on this, the decision was made to send her to the Emergency Room for further evaluation.   During this hospitalization, she was found to be positive for C. diff and was started on p.o. vancomycin. She was also given IV fluids for hydration.   Currently [name] feels as though she is feeling much better. Her diarrhea has decreased and she is currently not having any trouble with nausea or vomiting. She is unclear what caused the improvement but just noticed that she is overall feeling much better.   Currently, she denies any rectal bleeding, any hematemesis, any dysphagia.   PAST MEDICAL HISTORY:  1.  Hypertension.  2.  Alcohol abuse.  3.  Chronic abdominal pain.  4.  Vitamin D deficiency.     PAST SURGICAL HISTORY: Hysterectomy, appendectomy, cholecystectomy.   REVIEW OF SYSTEMS:  CONSTITUTIONAL: No weight gain or weight loss.  No fever or chills. Positive for generalized weakness and fatigue.  HEENT: No oral lesions or sore throat. No vision changes. GASTROINTESTINAL: See HPI.  HEME/LYMPH: No easy bruising or bleeding. CARDIOVASCULAR: No chest pain or dyspnea on exertion. GENITOURINARY: No  hematuria. INTEGUMENTARY: No rashes or pruritus PSYCHIATRIC: No depression/anxiety.  ENDOCRINE: No heat/cold intolerance, no hair loss or skin changes. ALLERGIC/IMMUNOLOGIC: Negative for hives. RESPIRATORY: No cough, no shortness of breath.  MUSCULOSKELETAL: No joint swelling or muscle pain.  SOCIAL HISTORY: She is an ongoing smoker. She denies any alcohol or recreational drugs.   FAMILY HISTORY: She denies any family history of colon cancer or other GI malignancy.   PHYSICAL EXAMINATION:  VITAL SIGNS: Temperature is 97.6. Pulse is 69. Respirations are 16. Blood pressure is 104/68. Oxygen saturation are 98% on room air.  GENERAL: Alert and oriented times 4.  No acute distress. Appears stated age. Chronically ill appearing.  HEENT: Normocephalic/atraumatic. Extraocular movements are intact. Anicteric. NECK: Soft, supple. JVP appears normal. No adenopathy. CHEST: Clear to auscultation. No wheeze or crackle. Respirations unlabored. HEART: Regular. No murmur, rub, or gallop.  Normal S1 and S2. ABDOMEN: Soft, nontender, nondistended.  Normal active bowel sounds in all four quadrants.  No organomegaly. No masses. Mild diffuse tenderness to palpation.  EXTREMITIES: No swelling, well perfused. SKIN: No rash or lesion. Skin color, texture, turgor normal. NEUROLOGICAL: Grossly intact. PSYCHIATRIC: Normal tone and affect. MUSCULOSKELETAL: No joint swelling or erythema.   LABORATORY DATA: Sodium 135, potassium 4.6, bicarb 18, chloride 107, BUN 27, creatinine 2.68. Liver panel is normal. White count is 24.8, hemoglobin 14, hematocrit 43. Platelets are 270.   Her stool for C. diff is positive.   CT of the abdomen shows diffuse thickening of the colon wall consistent with colitis. Also scattered diverticulosis without diverticulitis.   ASSESSMENT  AND PLAN:  1.  Diarrhea, Clostridium difficile. This is the cause of her diarrhea and her thickening on CT. It is also likely why she is feeling so  hydrated, fatigued and why she is showing evidence of volume depletion and renal insufficiency. Her white count is also significantly elevated at 25. I do agree with p.o. vancomycin at this time given her severe Clostridium difficile.  She is already  starting to feel significantly better. She should complete a 14-day course of p.o. vancomycin. I do not think that the Zosyn is necessary at this time given the likely etiology is known at this point.  2.  Nausea and vomiting. This seems to be a very chronic problem for her. It is encouraging that it is much better when I saw her in the hospital today. I agree with continuing Zofran as this seems to be helping. Hopefully she will also improve as we continue to treat her Clostridium difficile.   Thank you for this consult.   ____________________________ Arther Dames, MD mr:np D: 03/29/2013 17:27:24 ET T: 03/29/2013 20:43:26 ET JOB#: [id number]  cc: Arther Dames, MD, <Dictator> Mellody Life MD ELECTRONICALLY SIGNED 04/05/2013 16:25

## 2014-09-27 NOTE — Consult Note (Signed)
Brief Consult Note: Diagnosis: c diff colitis.   Patient was seen by consultant.   Consult note dictated.   Comments: thank you for care of Ms Hesler.  She appears much better today.  Agree with PO vanc,  IV fluids, zofran.  Should be able to d./c the zosyn.  Will con to follow.  Electronic Signatures: Arther Dames (MD)  (Signed 23-Oct-14 18:24)  Authored: Brief Consult Note   Last Updated: 23-Oct-14 18:24 by Arther Dames (MD)

## 2014-09-27 NOTE — Discharge Summary (Signed)
PATIENT NAME:  [name], [name] MR#:  [id number] DATE OF BIRTH:  February 17, 1961  DATE OF ADMISSION:  03/29/2013 DATE OF DISCHARGE:  04/01/2013  Addendum   The patient's discharge summary was done by me yesterday.     The patient's C. difficile has been positive.   DISCHARGE DIAGNOSES:  Clostridium difficile colitis, renal failure.  The patient also has history of anxiety.  A 54 year old female admitted for nausea, vomiting and diarrhea for a few weeks, and also had some abdominal pain. Stool for C. difficile has been positive. Started on vancomycin p.o. Seen by gastroenterologist. The patient improved with vancomycin  125 mg q.6 hours, along with probiotics..  The patient did not have any  more diarrhea. Following treatment, the abdominal pain resolved.  The patient discharged home with p.o. vancomycin.   The patient's lab data showed the electrolytes on admission: Sodium 135, potassium 4.6, chloride 107, bicarb 18, BUN 27, creatinine 2.68, glucose 77. WBC 24.8, hemoglobin 14.6, hematocrit 43, platelets 270. The patient's stool cultures were positive for C. difficile.    The patient improved. Her renal failure resolved with fluids.  White count also normalized to 8.9. The patient's renal function showed a creatinine of 1.49 on the 26th of October. Bicarb is improved to 19. The patient felt better with the fluids, and the patient was discharged home in stable condition.   DISCHARGE VITAL SIGNS: Temperature 98, heart rate 83, blood pressure 152/77, sats 97%.   DISCHARGE INSTRUCTIONS:  Advised her to follow up with gastroenterologist.  To have a repeat stool culture, to make sure the infection is resolved. The patient's symptoms nicely improved at the time of discharge with no diarrhea and tolerating a regular diet.  ____________________________ Epifanio Lesches, MD sk:dmm D: 04/03/2013 22:29:00 ET T: 04/03/2013 22:45:13 ET JOB#: [id number]  cc: Epifanio Lesches, MD, <Dictator> Epifanio Lesches MD ELECTRONICALLY SIGNED 04/19/2013 21:51

## 2014-09-27 NOTE — Consult Note (Signed)
Chief Complaint:  Subjective/Chief Complaint Covering for Dr. Rayann Heman. No further bleeding. Hgb stable. Reviewed recent EGD and flex sig from Dr. Dionne Milo. Sigmoid diverticulosis and mild gastritis. Tolerated solids.   VITAL SIGNS/ANCILLARY NOTES: **Vital Signs.:   78-ION-62 95:28  Systolic BP Systolic BP 413  Diastolic BP (mmHg) Diastolic BP (mmHg) 82  Mean BP 111   Brief Assessment:  GEN no acute distress   Cardiac Regular   Respiratory clear BS   Gastrointestinal Normal   Lab Results: Thyroid:  25-Jul-14 01:39   Thyroid Stimulating Hormone  6.05 (0.45-4.50 (International Unit)  ----------------------- Pregnant patients have  different reference  ranges for TSH:  - - - - - - - - - -  Pregnant, first trimetser:  0.36 - 2.50 uIU/mL)  Hepatic:  25-Jul-14 01:39   Bilirubin, Total 0.6  Alkaline Phosphatase 81  SGPT (ALT) 21  SGOT (AST) 20  Total Protein, Serum  5.9  Albumin, Serum  3.1  Routine Chem:  25-Jul-14 01:39   Glucose, Serum 79  BUN  19  Creatinine (comp) 1.29  Sodium, Serum 143  Potassium, Serum 3.9  Chloride, Serum  111  CO2, Serum 24  Calcium (Total), Serum  8.3  Anion Gap 8  Osmolality (calc) 286  eGFR (African American)  56  eGFR (Non-African American)  48 (eGFR values <34m/min/1.73 m2 may be an indication of chronic kidney disease (CKD). Calculated eGFR is useful in patients with stable renal function. The eGFR calculation will not be reliable in acutely ill patients when serum creatinine is changing rapidly. It is not useful in  patients on dialysis. The eGFR calculation may not be applicable to patients at the low and high extremes of body sizes, pregnant women, and vegetarians.)  Magnesium, Serum  1.7 (1.8-2.4 THERAPEUTIC RANGE: 4-7 mg/dL TOXIC: > 10 mg/dL  -----------------------)  26-Jul-14 04:21   Glucose, Serum 86  BUN 12  Creatinine (comp) 1.05  Sodium, Serum 144  Potassium, Serum 3.9  Chloride, Serum  113  CO2, Serum 22   Calcium (Total), Serum 8.5  Anion Gap 9  Osmolality (calc) 286  eGFR (African American) >60  eGFR (Non-African American) >60 (eGFR values <654mmin/1.73 m2 may be an indication of chronic kidney disease (CKD). Calculated eGFR is useful in patients with stable renal function. The eGFR calculation will not be reliable in acutely ill patients when serum creatinine is changing rapidly. It is not useful in  patients on dialysis. The eGFR calculation may not be applicable to patients at the low and high extremes of body sizes, pregnant women, and vegetarians.)  Cardiac:  25-Jul-14 01:39   CK, Total  20  CPK-MB, Serum  < 0.5 (Result(s) reported on 29 Dec 2012 at 03:08AM.)  Troponin I < 0.02 (0.00-0.05 0.05 ng/mL or less: NEGATIVE  Repeat testing in 3-6 hrs  if clinically indicated. >0.05 ng/mL: POTENTIAL  MYOCARDIAL INJURY. Repeat  testing in 3-6 hrs if  clinically indicated. NOTE: An increase or decrease  of 30% or more on serial  testing suggests a  clinically important change)  Routine Hem:  25-Jul-14 01:39   WBC (CBC) 8.3  RBC (CBC) 4.00  Hemoglobin (CBC) 14.7  Hematocrit (CBC) 41.8  Platelet Count (CBC) 198  MCV  105  MCH  36.8  MCHC 35.1  RDW 12.9  Neutrophil % 60.4  Lymphocyte % 24.3  Monocyte % 7.0  Eosinophil % 7.3  Basophil % 1.0  Neutrophil # 5.0  Lymphocyte # 2.0  Monocyte # 0.6  Eosinophil # 0.6  Basophil # 0.1 (Result(s) reported on 29 Dec 2012 at 02:54AM.)   Assessment/Plan:  Assessment/Plan:  Assessment LGI bleeding stopped. Possibly from hemorhoids or diverticulosis.   Plan Ok for discharge. Treat external hemorroids that were seen on admission. Can f/u with Dr. Rayann Heman as outpt if more GI issues arise. Will sign off. Thanks.   Electronic Signatures: Verdie Shire (MD)  (Signed 26-Jul-14 10:06)  Authored: Chief Complaint, VITAL SIGNS/ANCILLARY NOTES, Brief Assessment, Lab Results, Assessment/Plan   Last Updated: 26-Jul-14 10:06 by Verdie Shire (MD)

## 2014-09-27 NOTE — Discharge Summary (Signed)
PATIENT NAME:  [name], [name] MR#:  [id number] DATE OF BIRTH:  1960/08/18  DATE OF ADMISSION:  12/28/2012 DATE OF DISCHARGE:  12/30/2012   PRIMARY CARE PHYSICIAN: Merla Riches, PA-C  CONSULTANTS: Dr. Rayann Heman from GI and also with Dr. Candace Cruise from GI.  CHIEF COMPLAINT: Epigastric pain, nausea, vomiting and some rectal bleeding.   DISCHARGE DIAGNOSES:  1. Nausea, vomiting, possibly from gastroenteritis.  2. Lower gastrointestinal bleed, resolved, possibly from hemorrhoids.  3. Acute renal failure, resolved.  4. Systemic inflammatory response syndrome, resolved.  5. Hypertension.  6. History of vitamin D deficiency.  7. Alcohol abuse.  8. History of diverticulitis and diverticulosis.  9. History of gastritis.  10. Smoker.   DISCHARGE MEDICATIONS:  1. Hydrochlorothiazide 25 mg daily.  2. Vitamin D3 one tab once a day.  3. Omeprazole 20 mg 2 times a day. 4. Anusol 25 mg rectal suppository 1 suppository 2 times a day as needed for hemorrhoids.   DIET: Low sodium.   ACTIVITY: As tolerated.   FOLLOWUP: Please follow with PCP and your gastroenterologist within 1 to 2 weeks. If further vomiting bleeding, dizziness or any other issues, call your doctor right away.   DISPOSITION: She will be getting discharged to home.   SIGNIFICANT LABORATORIES AND IMAGING: Initial BUN 25, creatinine of 1.9, sodium 137, potassium 4.2, calcium 10.2, magnesium was 1.7. Initial LFTs within normal limits. On day of discharge, BUN was 12, creatinine of 1.05. Troponins were negative x4. TSH was slightly high at 6.05. Initial WBC was 12.8, hemoglobin initially 17.7, on July 25th it was 14.7, platelets were 246. WBC went down to 8.3, and platelets to 198 on July 25th. Urine cultures show mixed flora. Stool cultures: Holding for possible pathogens. UA had 2+ leukocyte esterase, 60 WBC, no bacteria. Lactic acid on arrival was 0.7. Chest x-ray, PA and lateral, on arrival showed no acute cardiopulmonary disease. MRI of  aorta with bilateral lower extremity runoff with contrast: No evidence of dissection. No evidence of acute abnormality otherwise.   HISTORY OF PRESENT ILLNESS AND HOSPITAL COURSE: For full details of H and P, please see the dictation by Dr. 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 She came in for having a tremendous amount of nausea, vomiting, vomited in excess of 20 to 30 times, and came in for epigastric pain radiating to the back. She was admitted to the hospitalist service. She also had some bloody stools apparently. She was noted to have acute renal failure with elevated BUN and creatinine, her hemoglobin was also elevated, and she appeared dehydrated. She was started on IV fluids with a GI consult. She had also complained of chest pain which was atypical, which was more epigastric area. She was ruled out for acute coronary syndrome with cyclic cardiac markers. She was started on PPI. Hemoglobins were trended, and they did trend down slightly, but I think the first hemoglobin of 17 was hemoconcentrated in the setting of excessive volume loss and acute renal failure and dehydration. With IV fluid resuscitation and symptomatic treatment, the renal failure is better. Her records were obtained from outpatient flexible sigmoidoscopy as well as EGD. The EGD had shown gastritis and the flexible sigmoidoscopy just diverticulosis. At this point, her symptoms have resolved. Stool cultures are pending, but I suspect the patient likely had a gastroenteritis. Diet has been  advanced. In regards to the acute lower GI bleed, this was potentially hemorrhoidal as the patient has external hemorrhoids on exam per H and P. She will be discharged with Anusol. She has no chest pains. Her systemic inflammatory response syndrome has resolved. We have continued her outpatient  medications.   TOTAL TIME SPENT: 35 minutes.   CODE STATUS: The patient is full code.   ____________________________ Vivien Presto, MD sa:OSi D: 12/30/2012 11:02:02 ET T: 12/30/2012 11:12:03 ET JOB#: [id number]  cc: Vivien Presto, MD, <Dictator> Merla Riches, PA-C Vivien Presto MD ELECTRONICALLY SIGNED 01/18/2013 13:25

## 2014-09-27 NOTE — H&P (Signed)
PATIENT NAME:  [name], [name] MR#:  [id number] DATE OF BIRTH:  1961-01-30  DATE OF ADMISSION:  03/29/2013  REFERRING PHYSICIAN:  Dr. Cinda Quest.   PRIMARY CARE PHYSCIAN:  Dr. Matthew Saras.  GASTROENTEROLOGIST:  Dr. Rayann Heman of [facility].   CHIEF COMPLAINT:  Nausea, vomiting, diarrhea.   HISTORY OF PRESENT ILLNESS:  [name] is a 54 year old Caucasian female with past medical history of hypertension, diverticulosis, presenting with persistent nausea, vomiting and diarrhea.  She has had these symptoms for approximately two weeks or more in duration, consisting of nausea with multiple bouts of emesis daily, which she describes as bilious, though nonbloody with associated diarrhea which she describes as watery multiple times daily.  She states over 7 times a day.  She has also had by mouth intolerance as persistent and worsening over the last two weeks.  She has had similar symptoms intermittently for almost one year in duration without any clear etiology.  With her current symptoms she went to see her gastroenterologist Dr. Rayann Heman and she was found to be hypotensive, systolic blood pressures in the 70s.  With that she was sent to [facility] for further work-up and evaluation.  Currently, she is complaining of abdominal pain which is suprapubic in location, cramping in quality, 4 to 5 out of 10 in intensity, nonradiating.  No worsening or relieving factors.   REVIEW OF SYSTEMS: CONSTITUTIONAL:  Denies fevers, though positive for fatigue, generalized weakness and subjective chills.  EYES:  Denies blurred vision, double vision, eye pain.  EARS, NOSE, THROAT:  Denies tinnitus, ear pain or hearing loss.  RESPIRATORY:  Denies cough, wheeze, shortness of breath.  CARDIOVASCULAR:  Denies chest pain, palpitations, edema.  GASTROINTESTINAL:  Positive for nausea, vomiting, diarrhea.  Abdominal pain as above.  Denies any hematemesis.  GENITOURINARY:  Denies dysuria, hematuria.  ENDOCRINE:  Denies nocturia  or thyroid problems.  HEMATOLOGIC AND LYMPHATIC:  Denies any anemia, easy bruising or bleeding.  SKIN:  Denies any rashes or lesions.  MUSCULOSKELETAL:  Denies any pain in her neck, back, shoulders or arthritic symptoms.  NEUROLOGIC:  Denies any paralysis or paresthesias.  PSYCHIATRIC:  Denies any anxiety or depressive symptoms.   PAST MEDICAL HISTORY:  Hypertension, history of alcohol abuse, diverticulosis, vitamin D deficiency, hysterectomy, appendectomy and cholecystectomy.   FAMILY HISTORY:  Significant for coronary artery disease in her father.  No gastrointestinal illnesses in her family.   SOCIAL HISTORY:  Current every day smoker.  She has a remote history of alcohol abuse, last drank about 6 to 7 months ago.  Denies any drug usage.   ALLERGIES:  No known drug allergies.   HOME MEDICATIONS:  Diclofenac sodium 75 mg 1 tab by mouth twice daily, hydroxyzine 25 mg by mouth at bedtime, lisinopril 10 mg by mouth daily, Prilosec 20 mg by mouth twice daily, oxycodone 5 mg by mouth q. 6 hours as needed for pain, temazepam 15 mg by mouth at bedtime, tizanidine 4 mg by mouth at bedtime, tramadol 50 mg by mouth q. 4 hours as needed for pain.   PHYSICAL EXAMINATION: VITAL SIGNS:  Temperature 98.3, heart rate 100, respirations 20, blood pressure 81/51, after 3 liters of normal saline now 95/62, saturating 96% on room air.  Weight 43.5 kg, BMI 20.1.  GENERAL:  Chronically ill-appearing Caucasian female who is in no acute distress.  HEAD:  Normocephalic, atraumatic.  EYES:  Pupils equal, round, reactive to light as well as accommodation.  Extraocular muscles intact.  No scleral icterus.  MOUTH:  Dry mucosal membranes.  Dentition intact.  No abscess noted.  EARS, NOSE, THROAT:  Throat clear without exudate.  No external lesions.  NECK:  Supple.  No thyromegaly.  No nodules.  No JVD.  PULMONARY:  Clear to auscultation bilaterally without wheezes, rubs or rhonchi.  No use of accessory muscles.  Good  respiratory effort.  CHEST:  Nontender to palpation.  CARDIOVASCULAR:  S1, S2, regular rate and rhythm.  No murmurs, rubs or gallops.  No edema.  Pedal pulses 2+ bilaterally.  GASTROINTESTINAL:  Soft, nondistended.  No masses.  No hepatosplenomegaly.  Positive bowel sounds.  Minimal tenderness to palpation over suprapubic region.  No rebound tenderness.  No guarding.  No motion tenderness.  MUSCULOSKELETAL:  No swelling, clubbing, edema.  Range of motion full in all extremities.  NEUROLOGIC:  Cranial nerves II through XII intact.  No gross focal neurological deficits.  Sensation intact.  Reflexes intact.  SKIN:  No ulcerations, lesions, rashes, cyanosis.  Skin is warm and dry.  Turgor is poor.  PSYCHIATRIC:  Mood and affect within normal limits.  Awake, alert, oriented x 3.  Insight and judgment intact.   LABORATORY DATA:  Sodium 135, potassium 4.6, chloride 107, bicarb 18, anion gap of 10, BUN 27, creatinine 2.68, glucose 77.  LFTs within normal limits.  WBC 24.8 which is neutrophil predominant, absolute neutrophil count of 22.3, hemoglobin 14.6, platelets of 270.  Urinalysis, leukocyte esterase trace, nitrite negative, WBCs 12, RBCs 3, epithelial cells 21, negative for signs of infection.  CT abdomen performed revealing colitis.   ASSESSMENT AND PLAN:  This is a 54 year old Caucasian female with history of diverticulosis, hypertension, presenting with persistent nausea, vomiting, diarrhea.  1.  Sepsis secondary to colitis, unclear etiology of colitis.  Consult gastroenterology.  Follow blood cultures, Clostridium difficile and stool cultures as noted by the Emergency Department.  Continue antibiotics of Zosyn.  IV fluids to keep map greater than 65.  2.  Acute kidney injury, likely prerenal, possibly acute tubular necrosis by now in the setting of gastrointestinal losses.  IV fluid hydration.  Follow her renal function.  3.  Hypertension.  Hold any antihypertensives.  4.  Hyponatremia secondary to  gastrointestinal losses.  IV fluid hydration.  Follow sodium level.  5.  DVT prophylaxis with heparin subQ.   6.  The patient is a FULL CODE.   TIME SPENT:  45 minutes.     ____________________________ Aaron Mose. Hower, MD dkh:ea D: 03/29/2013 03:10:10 ET T: 03/29/2013 03:53:47 ET JOB#: [id number]  cc: Aaron Mose. Hower, MD, <Dictator> DAVID Woodfin Ganja MD ELECTRONICALLY SIGNED 03/29/2013 4:16

## 2014-09-27 NOTE — Consult Note (Signed)
I personally saw and examined the patient. Agree with Tobe Sos dictation.   78F presented to ED for c/p but also reported multiple episodes of brbpr since last night.  She has has similar symptoms in the past and reports EGD/Colon 1 year ago along with EGD/Flex 3 months ago to investigate this same problem.    She does report severe hemorrhoids which do prolapse at time and which are currently active.  Her Hgb is currently 17.   Suspect this is hemorrhoidal bleeding but need to obtain the prev EGD/Colon and EGD/Flex report that was done 3 months ago.   Would continue to monitor H/H, and watch for bleeding.  If EGD/Flex were indeed done 3 months ago and EGD/Colon 1 year ago without an etiology, may need referral to surgery for consideration of hemorrhoidectomy.    Electronic Signatures: Arther Dames (MD)  (Signed on 24-Jul-14 18:25)  Authored  Last Updated: 24-Jul-14 18:25 by Arther Dames (MD)

## 2014-09-27 NOTE — H&P (Signed)
PATIENT NAME:  [name], DUCHENEAUX MR#:  [id number] DATE OF BIRTH:  07-20-1960  DATE OF ADMISSION:  12/28/2012  PRIMARY CARE PHYSICIAN: Maia Plan, PA-C  CHIEF COMPLAINT: Epigastric pain radiating to the back with vomiting 20 to 30 times since last night and rectal bleeding.   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 The patient states that she started having tightness of her chest and epigastric area this morning. She was presented to me as chest pain syndrome, but after talking to her and the rest of the story clears up, we can deduct that the patient has been vomiting 20 to 30 times since last night, a lot of dry heaving this morning.  The vomiting has been green without any blood, without any coffee-ground emesis that she can tell. She has been really nauseated for the last 2 to 3 days. She has not been eating or drinking anything for the last 48 hours. The patient at 7:00 a.m. this morning was working and started having some tightness of the chest and the epigastric area.  The intensity was about 7 out of 10 and it was continuous, occasionally burning, but mostly stabbing and pressure-like. The patient says that during that episode she started vomiting again and she got really sweaty. She denied any fever, but she has had chills for at least 2 days. She is having a lot of trouble urinating and she has been noticing blood in the stool for the past 2 days. Apparently, she has history of diverticulitis in the past for what she has bleeding, but this bleeding seems to be much more larger. She has history of 4 bowel movements and they all have large amount of blood in the bottom of the toilet. Her bowel movements are dark too. The patient is evaluated over here.  Since the pain was radiating to the back and middle of the scapular areas, she got  MRI to evaluate for aortic abnormalities like dissection, and it was negative. The patient is really dehydrated. She is in acute kidney injury. Her creatinine is usually around 0.8. Right now it is doubled that, 1.9. Her BUN is around 25.  Her calcium level is around 10.2 and all her hematology labs seem to be hemoconcentrated as well. She has slight elevation of d-dimer, but no signs of hypoxia or shortness of breath. The patient is admitted for evaluation of this issue. Since the patient has positive GI bleeding we are not going to be able to treat it as acute coronary syndrome and again all her clinic points more of a gastric problem rather than a heart problem. She does have history of smoking and family history of coronary artery disease and hypertension, but no other risk factors. She is a heavy drinker. She drinks moonshine, at least 7 or 8 glasses a day, and she stated she quit about a month ago and she has not drank anything since then. She did not go into DTs. The patient is admitted for evaluation and treatment of all of these conditions.   REVIEW OF SYSTEMS: CONSTITUTIONAL: Denies any fever. Positive fatigue. Positive weakness.  EYES: No blurred or double vision. No glaucoma.  EARS, NOSE, THROAT: No tinnitus. No difficulty swallowing. No hearing loss.  RESPIRATORY: No cough. No wheezing. No COPD diagnosed on the patient. The patient says that she occasionally has  a smoker's cough, what she calls.  CARDIOVASCULAR: No previous chest pain up until today. No orthopnea, no edema, no palpitations.  GASTROINTESTINAL: Positive nausea. Positive vomiting. Positive blood in stool. Positive dark stools.  Negative jaundice. Positive GERD.  GENITOURINARY: Positive difficulty urinating, tenderness and mild dysuria.  GYNECOLOGIC: No breast masses.  ENDOCRINE: No polyuria, polydipsia, polyphagia, cold or heat intolerance.  SKIN: No rashes or petechiae. She gets into tanning beds all the time and her skin is  very dark. MUSCULOSKELETAL: No rashes, lesions or changes of lesions or moles.  MUSCULOSKELETAL: No significant swelling of joints or gout.  NEUROLOGIC: No numbness, tingling or ataxia.  PSYCHIATRIC: No significant depression. The patient has mild anxiety.  PAST MEDICAL HISTORY: 1.  Hypertension.  2.  Current smoker.  3.  Diverticulitis.  4.  Alcohol abuse.  5.  Vitamin D deficiency.   ALLERGIES: No known drug allergies.   PAST SURGICAL HISTORY: 1.  Hysterectomy.  2.  Appendectomy.  3.  Cholecystectomy.   MEDICATIONS:  Hydrochlorothiazide and vitamin D once daily.   FAMILY HISTORY: Positive for advanced cancer, metastatic in her mom, unknown origin.  Positive for coronary artery disease and MI in father who had 5 stents. He is alive at the age of 44.   SOCIAL HISTORY: The patient smokes 1 pack a day for the last 20 years. Within the past month, she has been trying to cut back and she is smoking 5 cigarettes a day. Smoking cessation education has been given to the patient. The patient agrees on quitting smoking. I spent about 3 minutes educating the patient about the importance of quitting smoking and risks for coronary artery disease and other problems. The patient accepts to quit smoking. The patient is a drinker. She drinks moonshine, 6 to 8 glasses at night, large amounts mixed with [facility]. She states that she quit a month to a month and a half ago and she has not gone into any DTs. 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   PHYSICAL EXAMINATION: VITAL SIGNS: Blood pressure 126/68, pulse 109, respirations 22, temperature 97.6, oxygen saturation 95% on room air.  GENERAL: The patient is alert and oriented x 3. No acute distress. No respiratory distress. Hemodynamically stable.  HEENT: Pupils are equal and reactive. Extraocular movements are intact. Mucosa is dry. Anicteric sclerae. Pink conjunctivae. No oral lesions. Normocephalic, atraumatic.  NECK: Supple. No JVD. No  thyromegaly. No adenopathy. No carotid bruits. Trachea central.  HEART:  Regular rate and rhythm. No murmurs, rubs or gallops are appreciated at this moment. No tenderness to palpation at chest wall. No displacement of PMI.  LUNGS: Clear without any wheezing or crepitus. No use of accessory muscles.  ABDOMEN: Soft, very tender to palpation at the level of the epigastric area and also at the level of the left lower quadrant on the border of the colon. The patient has no rebound tenderness. No guarding. The patient's bowel sounds are positive and overall normal. No hepatosplenomegaly.  No hepatic stigmata. GENITAL: Negative for external lesions.  RECTAL:  Positive for external hemorrhoids, not inflamed, nontender. Positive for stool which is red and black and heavy positive guaiac. No internal lesions.  EXTREMITIES: No edema, cyanosis or clubbing. Pulses +2. Capillary refill less than 3.  SKIN: Without any rashes or petechiae. Has very dark skin from tanning. No moles with suspicious forms.  NEUROLOGIC: Cranial nerves II through XII intact.  Strength is equal in all 4 extremities. Sensation is normal for extremities.  PSYCHIATRIC: No significant agitation.  The patient is alert and oriented x 3.  MUSCULOSKELETAL: No joint effusions. No joint swelling. No deformity.  LABS AND IMAGING STUDIES: Cardiac enzymes are negative. Glucose 103, BUN 25, creatinine 1.9. Her baseline creatinine is around 0.8 and she is only 99 pounds for what her GFR is around 20. Her calcium is 10.2, which is also due to concentration and dehydration. Sodium 137 and potassium 4.2. LFTs are actually within normal limits. Normal AST at 24, ALT at 26.  Bilirubin is 0.4. Troponin is 0.02. White count is 12.8, hemoglobin 17, hematocrit 50 and platelet count 246. This is likely related to hemoconcentration as the patient is really dehydrated. She is also a smoker for what I think her hemoglobins are high anyway, but she definitely has GI  bleeding. Until we given her IV fluids we do not know how much she is going to drop. D-dimer is 0.8. Lactic acid is 0.7.   EKG: No ST depression or elevation. The patient was sinus tachy at 109.   The patient had a MRI that did not show any aortic abnormalities.   Chest x-ray:  No acute pulmonary issues.   ASSESSMENT AND PLAN: A 54 year old female who presented to the ER with complaint likely of chest pain, but mostly she has epigastric pain and gastrointestinal bleeding.  1.  Chest pain. This is atypical on the sense that it might be related to epigastric pain. Since the patient has a history of severe alcohol and smoking and vomiting 20 to 30 times since last night this could be related to peptic ulcer disease versus severe gastritis. We are going to put her on a Protonix drip and give her Protonix bolus. Since the patient has gastrointestinal bleeding, I am not going to give her more aspirin. She already got one time aspirin today, and I am not going to put her on heparin or anything like that. We are going to follow-up cardiac enzymes. At this moment, I do not know if she is going to need a stress test, but first we are going to evaluate her for GI bleeding. Her history does not really have much as a cardiac problem for what probably we can defer the stress test for later on or outpatient as long as her cardiac enzymes are negative.  2.  Gastrointestinal bleeding. The patient has significant black dark stools and also positive guaiac. We are going to consult GI.  The patient is an alcoholic who quit about 1 month ago. At this moment, her hemoglobin is 17, but I think it is just because she is really dehydrated and hemoconcentrated. We are going to give IV fluids and see how much it drops, but we are going to monitor q. 8 hours. The patient drinks a lot of moonshine. She does not have history of esophageal varices and there is no significant blood in her vomit for what I am not going to start octreotide  right now, but if her hemoglobin starts dropping or she has any blood in her vomit, we will start octreotide right away. Check amylase to rule out also pancreatitis since the pain goes down into the lower back and upper back. 3.  Smoker. The patient has received smoking cessation education for over 3 minutes. She states she is going to quit.  4.  Drinking, alcohol abuse. The patient has not drank anything for 1-1/2 months for what I am not going to start any DT protocol at this moment, but we  are going to observe her closely. The patient has been congratulated as far as quitting drinking.  5.  Acute kidney injury, likely secondary to dehydration from multiple episodes of vomiting and not eating or drinking anything for 2 days. Also could be associated to the gastrointestinal bleed.  IV fluids given. The patient is very tachycardic at 110, and this could be related just to her gastrointestinal bleed.  6.  Abdominal pain. The patient has history of diverticulitis.  At this moment, her white count is slightly elevated at 12, but could be due to hemoconcentration. We are going to repeat the test in the morning. If there is any fever, we are going to get a CT scan of the abdomen and pelvis to evaluate for acute diverticulitis. Although she is slightly tender in the left lower quadrant, I am not going to start any antibiotics just yet.  7.  Deep vein thrombosis prophylaxis. Since the patient has gastrointestinal bleeding, we are going to do only mechanical prophylaxis with SCDs and encourage ambulation.  8.  Gastrointestinal prophylaxis. The patient is on a Protonix drip.   The patient is a FULL CODE.   TIME SPENT: About 50 minutes. ____________________________ Felipa Furnace, MD rsg:sb D: 12/28/2012 15:50:29 ET T: 12/28/2012 16:26:15 ET JOB#: [id number]  cc: Felipa Furnace, MD, <Dictator> Violette Morneault Juanda Chance MD ELECTRONICALLY SIGNED 12/31/2012 0:35

## 2014-09-27 NOTE — Consult Note (Signed)
Brief Consult Note: Diagnosis: GI bleed, vomiting, chest pain.   Patient was seen by consultant.   Consult note dictated.   Orders entered.   Discussed with Attending MD.   Comments: Patient seen and exmained in the ER. Pt presents with chest pain, vomiting, and BRBPR. hx alcohol use and abuse but she states she has not had a drink for 6 weeks. Hgb stable. VSS.  Pt states she has had two coloscopies and two EGDs within the past year by Dr Minna Merritts. She recalls colon polyps and diverticulosis, but is unsure if she had esophageal varices. She was told there was no source of bleed identified on prior scopes. Will request endoscopy records from Dr Roselee Nova office. Recheck h&h in the morning. Continue PPI and antiemetics. Will make further reccs upon review on prior records and per clinical course. case discussed with Dr Rayann Heman. full consult dictated.  Electronic Signatures: Sherald Barge (PA-C)  (Signed 24-Jul-14 17:16)  Authored: Brief Consult Note   Last Updated: 24-Jul-14 17:16 by Sherald Barge (PA-C)

## 2014-09-27 NOTE — Discharge Summary (Signed)
PATIENT NAME:  [name], [name] MR#:  [id number] DATE OF BIRTH:  1960-11-28  DATE OF ADMISSION:  03/29/2013 DATE OF DISCHARGE:  04/01/2013  DISCHARGE DIAGNOSES:  1. Clostridium difficile colitis.  2. Renal failure.  DISCHARGE MEDICATIONS: 1. Omeprazole 20 mg p.o. b.i.d.  2. Lisinopril 10 mg p.o. daily.  3. Diclofenac sodium 75 mg p.o. b.i.d.  4. Oxycodone 5 mg at bedtime.  5. Tizanidine 4 mg p.o. daily every 4 hours.  6. Temazepam 15 mg once a day.  7. Vancomycin 125 mg, the patient was given vancomycin (125 mg po q6hrs  from October 26th to November 6th.   HOSPITAL COURSE: The patient is a 54 year old female patient with diarrhea. The patient had diarrhea, abdominal pain and nausea, vomiting for many days before she came. C. difficile is positive, and started on vancomycin. Now, the patient is not having nausea or vomiting or diarrhea, tolerating the vancomycin very well. The patient's renal failure also improved. Troponins have been negative. Renal failure also improved.   TIME SPENT ON DISCHARGE PREPARATION: More than 30 minutes.   The patient really felt better with no diarrhea or nausea, no vomiting, and did want to go home with vancomycin.    ____________________________ Epifanio Lesches, MD sk:lb D: 04/02/2013 22:17:26 ET T: 04/03/2013 06:37:36 ET JOB#: [id number]  cc: Epifanio Lesches, MD, <Dictator> Epifanio Lesches MD ELECTRONICALLY SIGNED 04/19/2013 22:47

## 2014-10-06 NOTE — Consult Note (Signed)
PATIENT NAME:  [name], [name] MR#:  [id number] DATE OF BIRTH:  1961/02/06  DATE OF CONSULTATION:  06/10/2014  CONSULTING PHYSICIAN:  Allyne Gee, MD  REASON FOR CONSULTATION: Shortness of breath.   HISTORY OF PRESENT ILLNESS:  This is a 54 year old female who has past medical history significant for COPD.  She also has a history of diverticulosis and hypertension. The patient  presented to the hospital with increasing shortness of breath. She was noted to be significantly hypoxic when she came in.  She had been having a little bit of a cough and some sputum production. She had been taking inhalers and has a history of smoking. She said that she was smoking up until about 2 weeks ago. The patient had no fevers or chills noted. She denies any chest pain. She had no syncopal episode.   PAST MEDICAL HISTORY: As already outlined above and COPD.  FAMILY HISTORY: Noncontributory.  SOCIAL HISTORY:  Positive for tobacco use.  No alcohol or drug use.  REVIEW OF SYSTEMS:  A 12-point review of systems performed and unremarkable other than what is noted above on HPI.   MEDICATIONS: Reviewed on the electronic medical record.   ALLERGIES: Negative.   PHYSICAL EXAMINATION: VITAL SIGNS: At the time she was seen, temperature was 97.4, pulse of 97, respiratory rate was 18, blood pressure 98/59, saturations were 94%.  NECK: Supple. No JVD. No adenopathy. No thyromegaly.  CHEST: No rales or rhonchi. Expansion is equal.  CARDIOVASCULAR: S1, S2 were normal. Regular rhythm. No gallop or rub.  ABDOMEN: Appeared to be soft and nontender.  SKIN: Without any acute rashes.  MUSCULOSKELETAL: No active synovitis. NEUROLOGIC:  She was moving all 4 extremities, was nonfocal.   IMAGING:  The patient's chest x-ray that was done on admission showed mild acute bronchitic changes without any airspace disease.   LABORATORY DATA: White count was 6.2, hemoglobin 16.9, hematocrit 50.5. The patient's BUN was seven,  creatinine 1.2, sodium 138, potassium 3.9 and glucose was 100.   IMPRESSION:  Acute chronic obstructive pulmonary disease exacerbation with acute respiratory failure.   PLAN: The patient is going to continue on IV steroids. Will continue with antibiotics. Continue with inhalers.  Smoking cessation discussed at length with the patient. She should follow up in the office after she is discharged.   Thank you for consulting me in the care of this patient. Will follow along. Further recommendations as necessary.  Overall prognosis is guarded.   ____________________________ Allyne Gee, MD sak:LT D: 06/10/2014 16:41:21 ET T: 06/10/2014 18:03:46 ET JOB#: [id number]  cc: Allyne Gee, MD, <Dictator> Allyne Gee MD ELECTRONICALLY SIGNED 06/22/2014 10:51

## 2014-10-06 NOTE — Discharge Summary (Signed)
PATIENT NAME:  [name], [name] MR#:  [id number] DATE OF BIRTH:  [birth year]  DATE OF ADMISSION:  06/09/2014 DATE OF DISCHARGE:  06/11/2014   ADMITTING DIAGNOSIS: Shortness of breath, cough.   DISCHARGE DIAGNOSES:  1.  Shortness of breath due to acute chronic obstructive pulmonary exacerbation.   2.  Hypertension.  3.  Diverticulosis.   CONSULTANTS: Dr. Humphrey Rolls.   PERTINENT LABORATORIES AND EVALUATIONS: Serum glucose 100, BUN 7, creatinine 1.2, serum sodium 138, potassium 3.9, chloride 103, bicarbonate 27, calcium was 9. Troponin negative. WBC count 6.2, hemoglobin 16.9. Chest x-ray showed mild changes of acute bronchitis.   HOSPITAL COURSE: The patient presented to the hospital with complaint of shortness of breath, cough ongoing for about 5 days' duration. The patient was wheezing and was short of breath. Due to these symptoms, she was admitted for acute COPD exacerbation and acute bronchitis. She was treated with nebulizers, steroids, and antibiotics with significant improvement in her symptoms. She was seen by pulmonary with whom she will follow up as an outpatient for PFTs. At this time, she is doing much better and is stable for discharge.   DISCHARGE MEDICATIONS: Temazepam 15 mg at bedtime, cyclobenzaprine 10 one tablet p.o. at bedtime, lisinopril 10 daily, diclofenac 75 1 tablet p.o. b.i.d., hydroxyzine 25 mg at bedtime, oxycodone 5 mg q. 6 p.r.n., tizanidine 4 mg at bedtime, prednisone taper starting at 60 taper by 10 until complete, Advair 1 puff b.i.d., Spiriva 18 mcg daily, guaifenesin 600 one tablet  p.o. b.i.d., nicotine patch 7 mg one patch daily, albuterol/ ipratropium 1 puff every 4 times a day as needed, azithromycin 250 one tablet p.o. daily x 4 days.   DIET: Low-sodium, low-fat, low-cholesterol.   ACTIVITY: As tolerated.   FOLLOWUP: Primary M.D. in 1-2 weeks. Follow up with Dr. Humphrey Rolls of pulmonary in 2-4 weeks.   TIME SPENT: 35 minutes.     ____________________________ Lafonda Mosses Posey Pronto, MD shp:bm D: 06/11/2014 20:37:28 ET T: 06/12/2014 01:53:15 ET JOB#: [id number]  cc: Lanice Folden H. Posey Pronto, MD, <Dictator> Alric Seton MD ELECTRONICALLY SIGNED 06/15/2014 9:25

## 2014-10-06 NOTE — H&P (Signed)
PATIENT NAME:  [name], [name] MR#:  [id number] DATE OF BIRTH:  [birth year]  DATE OF ADMISSION:  06/09/2014  REFERRING PHYSICIAN:  Gladstone Pih, MD   PRIMARY CARE PHYSICIAN:  Delma Freeze, MS, MHS, PA-C  ADMISSION DIAGNOSIS:  Acute-on-chronic respiratory failure with hypoxia.   HISTORY OF PRESENT ILLNESS:  This is a 54 year old Caucasian female who presents to the Emergency Department complaining of shortness of breath. The patient admits to feeling "terrible" 5 days ago. She says that she has become progressively weaker and has not been able to eat much other than oranges and water for the last 3 days. She states this is mostly due to cough and shortness of breath. She has tried taking Mucinex, Robitussin, Alka-Seltzer Cold, and an allergy pill, but none of these things have been helping. She says that she came to the hospital because she could not breathe. In the Emergency Department, the patient was found to have low pulse oximetry, and it did not improve significantly after multiple breathing treatments, which prompted the Emergency Department to call for admission.   REVIEW OF SYSTEMS: CONSTITUTIONAL:  The patient denies fever, but admits to generalized weakness.  EYES:  Denies blurred vision or inflammation.  EARS, NOSE, AND THROAT:  Denies tinnitus or sore throat.  RESPIRATORY:  Admits to cough and shortness of breath.  CARDIOVASCULAR:  Denies chest pain, palpitations, orthopnea, or paroxysmal nocturnal dyspnea.  GASTROINTESTINAL:  Denies nausea or vomiting, diarrhea, or abdominal pain.  GENITOURINARY:  Denies dysuria, increased frequency, or hesitancy of urination.  ENDOCRINE:  Denies polyuria or polydipsia.  HEMATOLOGIC AND LYMPHATIC:  Denies easy bruising or bleeding.  INTEGUMENTARY:  Denies rashes or lesions.  MUSCULOSKELETAL:  Denies arthralgias or myalgias.  NEUROLOGIC:  Denies numbness in the extremities or dysarthria.  PSYCHIATRIC:  Denies depression or suicidal ideation.    PAST MEDICAL HISTORY:  Hypertension, COPD, diverticulosis.   PAST SURGICAL HISTORY:  Appendectomy, cholecystectomy as well as hysterectomy.   SOCIAL HISTORY:  The patient has quit smoking 2 weeks now. She has a 7-1/2 pack-year history. She does not drink alcohol or do any illicit drugs.   FAMILY HISTORY:  The patient's mother is deceased of breast cancer.   MEDICATIONS:  1.  Cyclobenzaprine 10 mg 1 tablet p.o. at bedtime.  2.  Diclofenac sodium 75 mg delayed-release 1 tablet p.o. b.i.d.  3.  Hydroxyzine 25 mg capsule 1 capsule p.o. at bedtime.  4.  Lactobacillus acidophilus 1 capsule p.o. 3 times a day.  5.  Lisinopril 10 mg 1 tablet p.o. once a day.  6.  Omeprazole 20 mg 1 delayed-release capsule p.o. b.i.d.  7.  Oxycodone 5 mg 1 tablet p.o. q. 6 hours as needed for pain.  8.  Temazepam 15 mg 1 capsule p.o. at bedtime.  9.  Tizanidine 4 mg 1 tablet p.o. at bedtime.  10.  Tramadol 50 mg 1 tablet every 4 hours as needed for pain.  11.  Vancomycin 125 mg oral capsule 1 capsule p.o. every 6 hours.   ALLERGIES:  No known drug allergies.   PERTINENT LABORATORY RESULTS AND RADIOGRAPHIC FINDINGS:  Serum glucose is 100, BUN 7, creatinine 1.2, serum sodium 138, potassium 3.9, chloride 103, bicarbonate 27, and calcium is 9. Troponin is negative. White blood cell count is 6.2, hemoglobin 16.9, hematocrit 50.5, platelet count 124,000, and MCV is 106. Chest x-ray shows mild changes of acute bronchitis and/or asthma without focal airspace pneumonia.   PHYSICAL EXAMINATION: VITAL SIGNS:  Temperature is 98.7, pulse 138, respirations  28, blood pressure 117/90, pulse oximetry is 92% on 2 liters of oxygen via nasal cannula.  GENERAL:  The patient is alert and oriented x 3. She is in mild distress.  HEENT:  Normocephalic, atraumatic. Pupils are equal, round, and reactive to light and accommodation. Extraocular movements are intact. Mucous membranes are moist.  NECK:  Trachea is midline. No adenopathy.  Thyroid is nonpalpable and nontender.  CHEST:  Symmetric and atraumatic.  CARDIOVASCULAR:  Tachycardic rate with normal rhythm. Normal S1 and S2. No rubs, clicks, or murmurs appreciated. Peripheral pulses are 2+ bilaterally in the upper and lower extremities.  LUNGS:  The patient has prolonged expiratory phase accompanied with wheezing. She has some bronchial breath sounds that clear with cough. She has moderate air movement throughout both lung fields bilaterally.  ABDOMEN:  Positive bowel sounds. Soft, nontender, nondistended. No hepatosplenomegaly.  GENITOURINARY:  Deferred.  MUSCULOSKELETAL:  The patient moves all 4 extremities equally. There is 5/5 strength in the upper and lower extremities bilaterally.  SKIN:  Warm and dry. No rashes or lesions.  EXTREMITIES:  No clubbing, cyanosis, or edema.  NEUROLOGIC: Cranial nerves II through XII are grossly intact.  PSYCHIATRIC:  Mood is normal. Affect is congruent. The patient has good judgment and insight into her condition.   ASSESSMENT AND PLAN:  This is a 54 year old female admitted for acute-on-chronic respiratory failure.   1.  Acute-on-chronic respiratory failure. This is likely an exacerbation of the patient's chronic obstructive pulmonary disease. She was hypoxic on presentation, but has improved some with supplemental oxygen via nasal cannula. She has received multiple breathing treatments in the Emergency Department and is less tachycardic and tachypneic. I doubt she has any sepsis at this time. She has been given Solu-Medrol in the Emergency Department as well as azithromycin. I will start an oral steroid taper and continue her azithromycin. I have added Advair and Spiriva to her regimen as well.  2.  Hypertension, controlled. We will continue lisinopril.  3.  Diverticulosis, stable.  4.  Tobacco abuse. The patient quit smoking 2 weeks ago, but she still has some cravings. I will prescribe a low-dose NicoDerm patch.  5.  Deep vein  thrombosis prophylaxis. Heparin.  6.  Gastrointestinal prophylaxis. None.   CODE STATUS:  The patient is a full code.   Time spent on admission orders and patient care was approximately 35 minutes.    ____________________________ Kelton Pillar. Sheryle Hail, MD msd:nb D: 06/11/2014 02:18:35 ET T: 06/11/2014 02:28:28 ET JOB#: [id number]  cc: Kelton Pillar. Sheryle Hail, MD, <Dictator> Kelton Pillar Hasani Diemer MD ELECTRONICALLY SIGNED 06/19/2014 22:56

## 2014-11-14 ENCOUNTER — Encounter (HOSPITAL_COMMUNITY): Payer: Self-pay | Admitting: Gastroenterology

## 2014-12-18 ENCOUNTER — Encounter (HOSPITAL_COMMUNITY): Payer: Self-pay | Admitting: Emergency Medicine

## 2014-12-18 ENCOUNTER — Emergency Department (HOSPITAL_COMMUNITY): Payer: Self-pay

## 2014-12-18 ENCOUNTER — Emergency Department (HOSPITAL_COMMUNITY)
Admission: EM | Admit: 2014-12-18 | Discharge: 2014-12-18 | Disposition: A | Discharge status: Home / Self Care | Payer: Self-pay | Attending: Emergency Medicine | Admitting: Emergency Medicine

## 2014-12-18 DIAGNOSIS — Z8739 Personal history of other diseases of the musculoskeletal system and connective tissue: Secondary | ICD-10-CM | POA: Insufficient documentation

## 2014-12-18 DIAGNOSIS — I129 Hypertensive chronic kidney disease with stage 1 through stage 4 chronic kidney disease, or unspecified chronic kidney disease: Secondary | ICD-10-CM | POA: Insufficient documentation

## 2014-12-18 DIAGNOSIS — Z8639 Personal history of other endocrine, nutritional and metabolic disease: Secondary | ICD-10-CM | POA: Insufficient documentation

## 2014-12-18 DIAGNOSIS — Z79899 Other long term (current) drug therapy: Secondary | ICD-10-CM | POA: Insufficient documentation

## 2014-12-18 DIAGNOSIS — Z720 Tobacco use: Secondary | ICD-10-CM | POA: Insufficient documentation

## 2014-12-18 DIAGNOSIS — N189 Chronic kidney disease, unspecified: Secondary | ICD-10-CM | POA: Insufficient documentation

## 2014-12-18 DIAGNOSIS — K5732 Diverticulitis of large intestine without perforation or abscess without bleeding: Secondary | ICD-10-CM

## 2014-12-18 DIAGNOSIS — K219 Gastro-esophageal reflux disease without esophagitis: Secondary | ICD-10-CM | POA: Insufficient documentation

## 2014-12-18 LAB — BASIC METABOLIC PANEL
ANION GAP: 11 (ref 5–15)
BUN: 11 mg/dL (ref 6–20)
CHLORIDE: 106 mmol/L (ref 101–111)
CO2: 24 mmol/L (ref 22–32)
CREATININE: 1.08 mg/dL — AB (ref 0.44–1.00)
Calcium: 9.6 mg/dL (ref 8.9–10.3)
GFR calc non Af Amer: 58 mL/min — ABNORMAL LOW (ref >60)
Glucose, Bld: 95 mg/dL (ref 65–99)
Potassium: 4.3 mmol/L (ref 3.5–5.1)
Sodium: 141 mmol/L (ref 135–145)

## 2014-12-18 LAB — URINALYSIS, ROUTINE W REFLEX MICROSCOPIC
Bilirubin Urine: NEGATIVE
Glucose, UA: NEGATIVE mg/dL
Hgb urine dipstick: NEGATIVE
KETONES UR: NEGATIVE mg/dL
LEUKOCYTES UA: NEGATIVE
Nitrite: NEGATIVE
Protein, ur: NEGATIVE mg/dL
Specific Gravity, Urine: 1.005 (ref 1.005–1.030)
UROBILINOGEN UA: 0.2 mg/dL (ref 0.0–1.0)
pH: 6 (ref 5.0–8.0)

## 2014-12-18 LAB — CBC WITH DIFFERENTIAL/PLATELET
Basophils Absolute: 0 10*3/uL (ref 0.0–0.1)
Basophils Relative: 0 % (ref 0–1)
EOS ABS: 0.2 10*3/uL (ref 0.0–0.7)
Eosinophils Relative: 2 % (ref 0–5)
HCT: 45.4 % (ref 36.0–46.0)
Hemoglobin: 15.3 g/dL — ABNORMAL HIGH (ref 12.0–15.0)
LYMPHS ABS: 2.5 10*3/uL (ref 0.7–4.0)
Lymphocytes Relative: 25 % (ref 12–46)
MCH: 35.3 pg — ABNORMAL HIGH (ref 26.0–34.0)
MCHC: 33.7 g/dL (ref 30.0–36.0)
MCV: 104.6 fL — ABNORMAL HIGH (ref 78.0–100.0)
Monocytes Absolute: 1 10*3/uL (ref 0.1–1.0)
Monocytes Relative: 10 % (ref 3–12)
Neutro Abs: 6.6 10*3/uL (ref 1.7–7.7)
Neutrophils Relative %: 63 % (ref 43–77)
Platelets: 204 10*3/uL (ref 150–400)
RBC: 4.34 MIL/uL (ref 3.87–5.11)
RDW: 13.8 % (ref 11.5–15.5)
WBC: 10.3 10*3/uL (ref 4.0–10.5)

## 2014-12-18 MED ORDER — HYDROCODONE-ACETAMINOPHEN 5-325 MG PO TABS: 1.0000 | ORAL_TABLET | ORAL | Status: DC | PRN

## 2014-12-18 MED ORDER — CIPROFLOXACIN HCL 500 MG PO TABS: 500.0000 mg | ORAL_TABLET | Freq: Two times a day (BID) | ORAL | Status: DC

## 2014-12-18 MED ORDER — METRONIDAZOLE 500 MG PO TABS
500.0000 mg | ORAL_TABLET | Freq: Once | ORAL | Status: AC
  Administered 2014-12-18: 500 mg via ORAL
  Filled 2014-12-18: qty 1

## 2014-12-18 MED ORDER — HYDROMORPHONE HCL 1 MG/ML IJ SOLN
1.0000 mg | Freq: Once | INTRAMUSCULAR | Status: AC
  Administered 2014-12-18: 1 mg via INTRAVENOUS
  Filled 2014-12-18: qty 1

## 2014-12-18 MED ORDER — IOHEXOL 300 MG/ML  SOLN
100.0000 mL | Freq: Once | INTRAMUSCULAR | Status: AC | PRN
  Administered 2014-12-18: 100 mL via INTRAVENOUS

## 2014-12-18 MED ORDER — HYDROMORPHONE HCL 1 MG/ML IJ SOLN
1.0000 mg | Freq: Once | INTRAMUSCULAR | Status: AC
Start: 2014-12-18 — End: 2014-12-18
  Administered 2014-12-18: 1 mg via INTRAVENOUS
  Filled 2014-12-18: qty 1

## 2014-12-18 MED ORDER — METRONIDAZOLE 500 MG PO TABS: 500.0000 mg | ORAL_TABLET | Freq: Two times a day (BID) | ORAL | Status: DC

## 2014-12-18 MED ORDER — CIPROFLOXACIN HCL 500 MG PO TABS
500.0000 mg | ORAL_TABLET | Freq: Once | ORAL | Status: AC
  Administered 2014-12-18: 500 mg via ORAL
  Filled 2014-12-18: qty 1

## 2014-12-18 MED ORDER — ONDANSETRON 8 MG PO TBDP: 8.0000 mg | ORAL_TABLET | Freq: Three times a day (TID) | ORAL | Status: DC | PRN

## 2014-12-18 NOTE — Discharge Instructions (Signed)

## 2014-12-18 NOTE — ED Notes (Signed)
Patient transported to CT 

## 2014-12-18 NOTE — ED Notes (Signed)
Pt states "I feel like my bladder has dropped". Pt c/o painful urination and lower abd pain.

## 2014-12-18 NOTE — ED Provider Notes (Signed)
CSN: [id number]     Arrival date & time 12/18/14  1428 History   First MD Initiated Contact with Patient 12/18/14 1722     Chief Complaint  Patient presents with  . painful urination       HPI Patient reports increasing lower abdominal pain over the past 24 hours with associated painful urination.  She denies nausea and vomiting.  No history of recent diarrhea.  No fevers or chills.  She has had diverticulitis before in the past and this feels similar.  She denies vaginal complaints.   Past Medical History  Diagnosis Date  . Hypertension   . Hypothyroidism   . Chronic kidney disease     stones  . GERD (gastroesophageal reflux disease)   . Arthritis     hands  . Diverticulitis    Past Surgical History  Procedure Laterality Date  . Abdominal hysterectomy    . Cholecystectomy    . Appendectomy    . Esophagogastroduodenoscopy  01/07/2012    Procedure: ESOPHAGOGASTRODUODENOSCOPY (EGD);  Surgeon: Winfield Cunas., MD;  Location: [facility] ENDOSCOPY;  Service: Endoscopy;  Laterality: N/A;  . Colonoscopy  01/09/2012    Procedure: COLONOSCOPY;  Surgeon: Lear Ng, MD;  Location: [facility] ENDOSCOPY;  Service: Endoscopy;  Laterality: N/A;   History reviewed. No pertinent family history. History  Substance Use Topics  . Smoking status: Current Every Day Smoker -- 0.25 packs/day for 20 years    Types: Cigarettes  . Smokeless tobacco: Never Used  . Alcohol Use: 16.8 oz/week    28 Shots of liquor per week     Comment: 01/10/13 - quit 6 to 7 months ago, prior heavy use   OB History    Gravida Para Term Preterm AB TAB SAB Ectopic Multiple Living   1         1     Review of Systems  All other systems reviewed and are negative.     Allergies  Review of patient's allergies indicates no known allergies.  Home Medications   Prior to Admission medications   Medication Sig Start Date End Date Taking? Authorizing Provider  Cholecalciferol (VITAMIN D-3) 5000 UNITS TABS Take 10,000 Units  by mouth daily after breakfast.   Yes Historical Provider, MD  hydrALAZINE (APRESOLINE) 10 MG tablet Take 1 tablet (10 mg total) by mouth 3 (three) times daily. 07/20/13  Yes Allie Bossier, MD  vitamin E 400 UNIT capsule Take 800 Units by mouth daily.   Yes Historical Provider, MD  buPROPion (WELLBUTRIN SR) 150 MG 12 hr tablet Take 1 tablet (150 mg total) by mouth daily. 1 tab by mouth daily for day 1-3 : Then 1 tablet by mouth twice a day Patient not taking: Reported on 12/18/2014 07/20/13   Allie Bossier, MD  ciprofloxacin (CIPRO) 500 MG tablet Take 1 tablet (500 mg total) by mouth 2 (two) times daily. Patient not taking: Reported on 12/18/2014 07/20/13   Allie Bossier, MD  labetalol (NORMODYNE) 100 MG tablet Take 1 tablet (100 mg total) by mouth 2 (two) times daily. Patient not taking: Reported on 12/18/2014 07/20/13   Allie Bossier, MD  metroNIDAZOLE (FLAGYL) 500 MG tablet Take 1 tablet (500 mg total) by mouth 3 (three) times daily. Patient not taking: Reported on 12/18/2014 07/20/13   Allie Bossier, MD  pantoprazole (PROTONIX) 40 MG tablet Take 1 tablet (40 mg total) by mouth 2 (two) times daily. Patient not taking: Reported on 12/18/2014 07/20/13   Vicente Serene  Aquilla Solian, MD   BP 161/98 mmHg  Pulse 87  Temp(Src) 98.3 F (36.8 C) (Oral)  Resp 17  SpO2 96% Physical Exam  Constitutional: She is oriented to person, place, and time. She appears well-developed and well-nourished. No distress.  HENT:  Head: Normocephalic and atraumatic.  Eyes: EOM are normal.  Neck: Normal range of motion.  Cardiovascular: Normal rate, regular rhythm and normal heart sounds.   Pulmonary/Chest: Effort normal and breath sounds normal.  Abdominal: Soft. She exhibits no distension.  Suprapubic abdominal tenderness without guarding or rebound  Musculoskeletal: Normal range of motion.  Neurological: She is alert and oriented to person, place, and time.  Skin: Skin is warm and dry.  Psychiatric: She has a normal mood and  affect. Judgment normal.  Nursing note and vitals reviewed.   ED Course  Procedures (including critical care time) Labs Review Labs Reviewed  URINALYSIS, ROUTINE W REFLEX MICROSCOPIC (NOT AT [facility]) - Abnormal; Notable for the following:    APPearance CLOUDY (*)    All other components within normal limits  CBC WITH DIFFERENTIAL/PLATELET - Abnormal; Notable for the following:    Hemoglobin 15.3 (*)    MCV 104.6 (*)    MCH 35.3 (*)    All other components within normal limits  BASIC METABOLIC PANEL - Abnormal; Notable for the following:    Creatinine, Ser 1.08 (*)    GFR calc non Af Amer 58 (*)    All other components within normal limits    Imaging Review Ct Abdomen Pelvis W Contrast  12/18/2014   CLINICAL DATA:  Painful urination and lower abdominal pain for 2 days. History of hysterectomy, cholecystectomy, appendectomy.  EXAM: CT ABDOMEN AND PELVIS WITH CONTRAST  TECHNIQUE: Multidetector CT imaging of the abdomen and pelvis was performed using the standard protocol following bolus administration of intravenous contrast.  CONTRAST:  162mL OMNIPAQUE IOHEXOL 300 MG/ML  SOLN  COMPARISON:  07/16/2013  FINDINGS: Dependent atelectasis in the lung bases.  Surgical absence of the gallbladder. No bile duct dilatation. The liver, spleen, pancreas, adrenal glands, inferior vena cava, and retroperitoneal lymph nodes are unremarkable. Small cysts in the right kidney. No hydronephrosis or solid mass in either kidney. Prominent calcification in the lower abdominal aorta and iliac arteries. No aneurysm. The stomach, small bowel, and colon are not abnormally distended and no wall thickening is appreciated. No free air or free fluid in the abdomen.  Pelvis: Diverticulosis of the sigmoid colon. There is thickening of the sigmoid colon wall with mild pericolonic infiltration consistent with diverticulitis. No abscess. Small amount of free fluid in the pelvis is likely reactive. Surgical absence of the appendix.  Bladder wall is not thickened. Uterus is surgically absent. No pelvic mass or lymphadenopathy. No destructive bone lesions. Schmorl's nodes.  IMPRESSION: Diverticulosis with changes of mild diverticulitis in the sigmoid colon. No abscess.   Electronically Signed   By: Lucienne Capers M.D.   On: 12/18/2014 21:21  I personally reviewed the imaging tests through PACS system I reviewed available ER/hospitalization records through the EMR    EKG Interpretation None      MDM   Final diagnoses:  None    Diverticulitis without complication.     Jola Schmidt, MD 12/18/14 2219

## 2015-06-19 ENCOUNTER — Other Ambulatory Visit: Payer: Self-pay | Admitting: Nurse Practitioner

## 2015-06-19 DIAGNOSIS — Z1231 Encounter for screening mammogram for malignant neoplasm of breast: Secondary | ICD-10-CM

## 2015-06-27 ENCOUNTER — Ambulatory Visit: Payer: Self-pay | Attending: Nurse Practitioner

## 2015-07-07 ENCOUNTER — Ambulatory Visit
Admission: RE | Admit: 2015-07-07 | Discharge: 2015-07-07 | Disposition: A | Discharge status: Home / Self Care | Payer: BLUE CROSS/BLUE SHIELD | Source: Ambulatory Visit | Attending: Nurse Practitioner | Admitting: Nurse Practitioner

## 2015-07-07 DIAGNOSIS — Z1231 Encounter for screening mammogram for malignant neoplasm of breast: Secondary | ICD-10-CM | POA: Insufficient documentation

## 2015-07-17 ENCOUNTER — Encounter: Payer: Self-pay | Admitting: Physical Therapy

## 2015-07-17 ENCOUNTER — Ambulatory Visit: Payer: BLUE CROSS/BLUE SHIELD | Attending: Nurse Practitioner | Admitting: Physical Therapy

## 2015-07-17 DIAGNOSIS — R262 Difficulty in walking, not elsewhere classified: Secondary | ICD-10-CM | POA: Diagnosis present

## 2015-07-17 DIAGNOSIS — M5442 Lumbago with sciatica, left side: Secondary | ICD-10-CM | POA: Insufficient documentation

## 2015-07-17 DIAGNOSIS — M5441 Lumbago with sciatica, right side: Secondary | ICD-10-CM | POA: Insufficient documentation

## 2015-07-17 DIAGNOSIS — R531 Weakness: Secondary | ICD-10-CM | POA: Insufficient documentation

## 2015-07-17 NOTE — Patient Instructions (Signed)
Pelvic Tilt  Lying on back with knees bent, (Ball under legs) Flatten back by tightening stomach muscles and rocking hips back Hold for 5 sec, Repeat __10__ times per set. Do __1__ sets per session. Do __2__ sessions per day.  http://orth.exer.us/134    Copyright  VHI. All rights reserved. Knee to Chest (Flexion)  Lying on back with ball under feet, bend knees and pull legs up towards chest, letting ball roll with you.  Pull knee toward chest. Feel stretch in lower back or buttock area. Breathing deeply, Hold __10__ seconds. Repeat with other knee. Repeat _2-3___ times. Do _2-3___ sessions per day.  http://gt2.exer.us/225   Copyright  VHI. All rights reserved.   Lower Trunk Rotation Stretch  Lying on back with knees bent, (BALL UNDER LEGS) Keeping back flat and feet together, rotate knees side to side slowly and in pain free range of motion.  Hold _2___ seconds. Repeat for 1-2 minutes. Do __1__ sets per session. Do __2-3__ sessions per day.  http://orth.exer.us/122

## 2015-07-17 NOTE — Therapy (Signed)
[name] [address] [place], [place], [address] Phone: [phone]   Fax:  [phone]  Physical Therapy Evaluation  Patient Details  Name: [name] MRN: [id number] Date of Birth: 11/17/1960 Referring Provider: Jerolyn Center NP  Encounter Date: 07/17/2015      PT End of Session - 07/17/15 1026    Visit Number 1   Number of Visits 13   Date for PT Re-Evaluation 08/28/15   PT Start Time 0920   PT Stop Time 1015   PT Time Calculation (min) 55 min   Activity Tolerance Patient limited by pain   Behavior During Therapy Restless      Past Maria History  Diagnosis Date  . Hypothyroidism   . Chronic kidney disease     stones  . GERD (gastroesophageal reflux disease)   . Diverticulitis   . Hypertension     somewhat controlled, taking medication  . Arthritis     hands, back, knees    Past Surgical History  Procedure Laterality Date  . Abdominal hysterectomy    . Cholecystectomy    . Appendectomy    . Esophagogastroduodenoscopy  01/07/2012    Procedure: ESOPHAGOGASTRODUODENOSCOPY (EGD);  Surgeon: Winfield Cunas., MD;  Location: [facility] ENDOSCOPY;  Service: Endoscopy;  Laterality: N/A;  . Colonoscopy  01/09/2012    Procedure: COLONOSCOPY;  Surgeon: Lear Ng, MD;  Location: [facility] ENDOSCOPY;  Service: Endoscopy;  Laterality: N/A;    There were no vitals filed for this visit.  Visit Diagnosis:  Bilateral low back pain with sciatica, sciatica laterality unspecified - Plan: PT plan of care cert/re-cert  Weakness - Plan: PT plan of care cert/re-cert  Difficulty walking - Plan: PT plan of care cert/re-cert      Subjective Assessment - 07/17/15 0917    Subjective 55 yo Female reports chornic low back pain for last few years; She denies any incident/accident but reports that it just started hurting. She reports standing on her feet most of the day; She reports having pain throughout the day and at  night; She has a hard time sleeping; She is able to sleep appoximately 3-4 hours per night with frequent sleep disturbances; She reports mostly low back pain ;She reports sharp pains down legs to knees/lower leg; She reports tingling donw legs to knees; She reports increased numbness in toes with prolonged sitting; She reports having to catch self from time to time but hasn't had any real falls;    Pertinent History personal factors affecting rehab: chronic pain, job demands, frequent sleep disturbances, arthritis (progresssive condition)   Limitations Lifting;Standing;Sitting   How long can you sit comfortably? 10 min   How long can you stand comfortably? stands for work and has to take pain pills to tolerate working;    How long can you walk comfortably? She reports needing something to hold onto when walking; she is not using any assistive device at this time but occasionally will use a stick when walking outside;    Diagnostic tests Has Had X-rays which showed decreasd disc height but otherwise no abnormality;    Patient Stated Goals "Get better"; Be able to walk outside; increase activity within the home; reduce back pain;    Currently in Pain? Yes   Pain Score 10-Worst pain ever   Pain Location Back   Pain Orientation Lower   Pain Descriptors / Indicators Stabbing   Pain Type Chronic pain   Pain Radiating Towards BLE legs;  Plan - 07/17/15 1027    Clinical Impression Statement 56 yo Female presents to therapy with lumbar pain which has been presents for last few years. She was diagnosed with spondylosis from x-rays. She reports worsening pain with sharp burning pain into BLE which is worse with prolonged sitting or standing. Patient works full-time as a Educational psychologist at a  [employer] and reports increased pain with lifting and standing tasks. She tested positive for possible lumbar nerve impingement with positive SLR; She demonstrates decreased lumbar extension ROM; She also exhibits weakness in BLE. Patient has moderate to severe tenderness to lumbar paraspinals and spine. She would benefit from additional skilled PT Intervention to improve LE strength,reduce back pain and improve tolerance with work tasks.    Pt will benefit from skilled therapeutic intervention in order to improve on the following deficits Decreased endurance;Hypomobility;Decreased activity tolerance;Decreased strength;Pain;Difficulty walking;Decreased mobility;Decreased balance;Decreased range of motion;Improper body mechanics;Postural dysfunction;Impaired flexibility   Rehab Potential Fair   Clinical Impairments Affecting Rehab Potential positive: motivated; negative: chronic pain, weakness; Patient's clinical presentation is unstable as she has severe back pain with BLE radiculopathy; her pain varies in severity with different tasks and she exhibits decreased tolerance with most mobility;    PT Frequency 2x / week   PT Duration 6 weeks   PT Treatment/Interventions Aquatic Therapy;Cryotherapy;Electrical Stimulation;Moist Heat;Traction;Balance training;Therapeutic exercise;Therapeutic activities;Functional mobility training;Stair training;Gait training;DME Instruction;Ultrasound;Neuromuscular re-education;Patient/family education;Manual techniques;Energy conservation;Dry needling;Passive range of motion   PT Next Visit Plan work on lumbar flexion, manual therapy? maybe try traction or TENs   PT Home Exercise Plan initiated, see patient instructions   Consulted and Agree with Plan of Care Patient         Problem List Patient Active Problem List   Diagnosis Date Noted  . Colitis 07/16/2013  . Acute renal failure ([place]) 07/16/2013  . HTN (hypertension) 07/16/2013  . Acute diverticulitis  07/16/2013  . Protein-calorie malnutrition, severe ([place]) 01/12/2013  . GIB (gastrointestinal bleeding) 01/07/2012  . Leukocytosis 01/07/2012  . Alcohol abuse 01/07/2012    [name] PT, DPT 07/17/2015, 10:37 AM  [facility] [address] [place], [place], [address] Phone: [phone]   Fax:  [phone]  Name: [name] MRN: [id number] Date of Birth: 1960/09/06  [name] [address] [place], [place], [address] Phone: [phone]   Fax:  [phone]  Physical Therapy Evaluation  Patient Details  Name: [name] MRN: [id number] Date of Birth: 11/17/1960 Referring Provider: Jerolyn Center NP  Encounter Date: 07/17/2015      PT End of Session - 07/17/15 1026    Visit Number 1   Number of Visits 13   Date for PT Re-Evaluation 08/28/15   PT Start Time 0920   PT Stop Time 1015   PT Time Calculation (min) 55 min   Activity Tolerance Patient limited by pain   Behavior During Therapy Restless      Past Maria History  Diagnosis Date  . Hypothyroidism   . Chronic kidney disease     stones  . GERD (gastroesophageal reflux disease)   . Diverticulitis   . Hypertension     somewhat controlled, taking medication  . Arthritis     hands, back, knees    Past Surgical History  Procedure Laterality Date  . Abdominal hysterectomy    . Cholecystectomy    . Appendectomy    . Esophagogastroduodenoscopy  01/07/2012    Procedure: ESOPHAGOGASTRODUODENOSCOPY (EGD);  Surgeon: Winfield Cunas., MD;  Location: [facility] ENDOSCOPY;  Service: Endoscopy;  Laterality: N/A;  . Colonoscopy  01/09/2012    Procedure: COLONOSCOPY;  Surgeon: Lear Ng, MD;  Location: [facility] ENDOSCOPY;  Service: Endoscopy;  Laterality: N/A;    There were no vitals filed for this visit.  Visit Diagnosis:  Bilateral low back pain with sciatica, sciatica laterality unspecified - Plan: PT plan of care cert/re-cert  Weakness - Plan: PT plan of care cert/re-cert  Difficulty walking - Plan: PT plan of care cert/re-cert      Subjective Assessment - 07/17/15 0917    Subjective 55 yo Female reports chornic low back pain for last few years; She denies any incident/accident but reports that it just started hurting. She reports standing on her feet most of the day; She reports having pain throughout the day and at  night; She has a hard time sleeping; She is able to sleep appoximately 3-4 hours per night with frequent sleep disturbances; She reports mostly low back pain ;She reports sharp pains down legs to knees/lower leg; She reports tingling donw legs to knees; She reports increased numbness in toes with prolonged sitting; She reports having to catch self from time to time but hasn't had any real falls;    Pertinent History personal factors affecting rehab: chronic pain, job demands, frequent sleep disturbances, arthritis (progresssive condition)   Limitations Lifting;Standing;Sitting   How long can you sit comfortably? 10 min   How long can you stand comfortably? stands for work and has to take pain pills to tolerate working;    How long can you walk comfortably? She reports needing something to hold onto when walking; she is not using any assistive device at this time but occasionally will use a stick when walking outside;    Diagnostic tests Has Had X-rays which showed decreasd disc height but otherwise no abnormality;    Patient Stated Goals "Get better"; Be able to walk outside; increase activity within the home; reduce back pain;    Currently in Pain? Yes   Pain Score 10-Worst pain ever   Pain Location Back   Pain Orientation Lower   Pain Descriptors / Indicators Stabbing   Pain Type Chronic pain   Pain Radiating Towards BLE legs;  [name] [address] [place], [place], [address] Phone: [phone]   Fax:  [phone]  Physical Therapy Evaluation  Patient Details  Name: [name] MRN: [id number] Date of Birth: 11/17/1960 Referring Provider: Jerolyn Center NP  Encounter Date: 07/17/2015      PT End of Session - 07/17/15 1026    Visit Number 1   Number of Visits 13   Date for PT Re-Evaluation 08/28/15   PT Start Time 0920   PT Stop Time 1015   PT Time Calculation (min) 55 min   Activity Tolerance Patient limited by pain   Behavior During Therapy Restless      Past Maria History  Diagnosis Date  . Hypothyroidism   . Chronic kidney disease     stones  . GERD (gastroesophageal reflux disease)   . Diverticulitis   . Hypertension     somewhat controlled, taking medication  . Arthritis     hands, back, knees    Past Surgical History  Procedure Laterality Date  . Abdominal hysterectomy    . Cholecystectomy    . Appendectomy    . Esophagogastroduodenoscopy  01/07/2012    Procedure: ESOPHAGOGASTRODUODENOSCOPY (EGD);  Surgeon: Winfield Cunas., MD;  Location: [facility] ENDOSCOPY;  Service: Endoscopy;  Laterality: N/A;  . Colonoscopy  01/09/2012    Procedure: COLONOSCOPY;  Surgeon: Lear Ng, MD;  Location: [facility] ENDOSCOPY;  Service: Endoscopy;  Laterality: N/A;    There were no vitals filed for this visit.  Visit Diagnosis:  Bilateral low back pain with sciatica, sciatica laterality unspecified - Plan: PT plan of care cert/re-cert  Weakness - Plan: PT plan of care cert/re-cert  Difficulty walking - Plan: PT plan of care cert/re-cert      Subjective Assessment - 07/17/15 0917    Subjective 55 yo Female reports chornic low back pain for last few years; She denies any incident/accident but reports that it just started hurting. She reports standing on her feet most of the day; She reports having pain throughout the day and at  night; She has a hard time sleeping; She is able to sleep appoximately 3-4 hours per night with frequent sleep disturbances; She reports mostly low back pain ;She reports sharp pains down legs to knees/lower leg; She reports tingling donw legs to knees; She reports increased numbness in toes with prolonged sitting; She reports having to catch self from time to time but hasn't had any real falls;    Pertinent History personal factors affecting rehab: chronic pain, job demands, frequent sleep disturbances, arthritis (progresssive condition)   Limitations Lifting;Standing;Sitting   How long can you sit comfortably? 10 min   How long can you stand comfortably? stands for work and has to take pain pills to tolerate working;    How long can you walk comfortably? She reports needing something to hold onto when walking; she is not using any assistive device at this time but occasionally will use a stick when walking outside;    Diagnostic tests Has Had X-rays which showed decreasd disc height but otherwise no abnormality;    Patient Stated Goals "Get better"; Be able to walk outside; increase activity within the home; reduce back pain;    Currently in Pain? Yes   Pain Score 10-Worst pain ever   Pain Location Back   Pain Orientation Lower   Pain Descriptors / Indicators Stabbing   Pain Type Chronic pain   Pain Radiating Towards BLE legs;

## 2015-07-23 ENCOUNTER — Encounter: Payer: BLUE CROSS/BLUE SHIELD | Admitting: Physical Therapy

## 2015-07-29 ENCOUNTER — Ambulatory Visit: Payer: BLUE CROSS/BLUE SHIELD | Admitting: Physical Therapy

## 2015-07-29 DIAGNOSIS — M5441 Lumbago with sciatica, right side: Secondary | ICD-10-CM

## 2015-07-29 DIAGNOSIS — R262 Difficulty in walking, not elsewhere classified: Secondary | ICD-10-CM

## 2015-07-29 DIAGNOSIS — R531 Weakness: Secondary | ICD-10-CM

## 2015-07-29 DIAGNOSIS — M5442 Lumbago with sciatica, left side: Principal | ICD-10-CM

## 2015-07-29 NOTE — Therapy (Signed)
[name] [address] [place], [place], [address] Phone: [phone]   Fax:  [phone]  Physical Therapy Treatment  Patient Details  Name: [name] MRN: [id number] Date of Birth: 17-Sep-1960 Referring Provider: Jerolyn Center NP  Encounter Date: 07/29/2015      PT End of Session - 07/29/15 1420    Visit Number 2   Number of Visits 13   Date for PT Re-Evaluation 08/28/15   PT Start Time 1350   PT Stop Time 1430   PT Time Calculation (min) 40 min   Activity Tolerance Patient limited by pain   Behavior During Therapy Restless      Past Medical History  Diagnosis Date  . Hypothyroidism   . Chronic kidney disease     stones  . GERD (gastroesophageal reflux disease)   . Diverticulitis   . Hypertension     somewhat controlled, taking medication  . Arthritis     hands, back, knees    Past Surgical History  Procedure Laterality Date  . Abdominal hysterectomy    . Cholecystectomy    . Appendectomy    . Esophagogastroduodenoscopy  01/07/2012    Procedure: ESOPHAGOGASTRODUODENOSCOPY (EGD);  Surgeon: Winfield Cunas., MD;  Location: [facility] ENDOSCOPY;  Service: Endoscopy;  Laterality: N/A;  . Colonoscopy  01/09/2012    Procedure: COLONOSCOPY;  Surgeon: Lear Ng, MD;  Location: [facility] ENDOSCOPY;  Service: Endoscopy;  Laterality: N/A;    There were no vitals filed for this visit.  Visit Diagnosis:  Bilateral low back pain with sciatica, sciatica laterality unspecified  Weakness  Difficulty walking      Subjective Assessment - 07/29/15 1357    Subjective Patient reports doing HEP every night. She reports increased right sided low back/hip/knee and foot pain; She also reports increased RUE pain to hands; She reports a little less intensity when doing exercise but her radicular signs have gotten worse;    Pertinent History personal factors affecting rehab: chronic pain, job demands, frequent sleep  disturbances, arthritis (progresssive condition)   Limitations Lifting;Standing;Sitting   How long can you sit comfortably? 10 min   How long can you stand comfortably? stands for work and has to take pain pills to tolerate working;    How long can you walk comfortably? She reports needing something to hold onto when walking; she is not using any assistive device at this time but occasionally will use a stick when walking outside;    Diagnostic tests Has Had X-rays which showed decreasd disc height but otherwise no abnormality;    Patient Stated Goals "Get better"; Be able to walk outside; increase activity within the home; reduce back pain;    Currently in Pain? Yes   Pain Score 9    Pain Location Back   Pain Orientation Right;Lower   Pain Descriptors / Indicators Aching;Burning   Pain Type Chronic pain   Pain Onset More than a month ago       TREATMENT: PT assessed patient's discomfort with increased tingling and pain down RUE and RLE; Patient reports compliance with HEP with increased pain with lumbar trunk rotation to right side; PT performed manual therapy: Double knee to chest passive stretch 20 sec hold x2; PT performed gentle lumbosacral distraction 10 sec hold, 10 sec rest x4 min while assessing low back/RLE radicular symptoms; Patient reports no pain during lumbar distraction with less LE radicular symptoms. However, upon sitting up on edge of table patient reports increased  tingling down BLE and stiffness and pain in low back and upper back;  PT applied TENs (interferential) to upper and lower back, at tolerated intensity #9 x20 min concurrent with moist heat in hooklying position; Patient reports less pain and less discomfort with TENs; She was educated in log-roll technique to reduce pain with bed mobility; Pain after treatment session was 5/10; She was slow with ambulation due to discomfort.                           PT Education - 07/29/15 1419     Education provided Yes   Education Details TENs, manual therapy, HEP   Person(s) Educated Patient   Methods Explanation;Verbal cues   Comprehension Verbalized understanding;Returned demonstration;Verbal cues required             PT Long Term Goals - 07/17/15 1032    PT LONG TERM GOAL #1   Title Patient will be independent in home exercise program to improve strength/mobility for better functional independence with ADLs. by 08/28/15   Time 6   Period Weeks   Status New   PT LONG TERM GOAL #2   Title Patient (55 years old) will complete five times sit to stand test in < 10 seconds indicating an increased LE strength and improved balance. by 08/28/15   Time 6   Period Weeks   Status New   PT LONG TERM GOAL #3   Title Patient will increase BLE gross strength to 4+/5 as to improve functional strength for independent gait, increased standing tolerance and increased ADL ability. by 08/28/15   Time 6   Period Weeks   Status New   PT LONG TERM GOAL #4   Title Patient will report a worst pain of 5/10 on VAS in    low back         to improve tolerance with ADLs and reduced symptoms with activities. by 08/28/15   Time 6   Period Weeks   Status New   PT LONG TERM GOAL #5   Title  Patient will reduce modified Oswestry score to <20 as to demonstrate minimal disability with ADLs including improved sleeping tolerance, walking/sitting tolerance etc for better mobility with ADLs. by 08/28/15   Time 6   Period Weeks   Status New   Additional Long Term Goals   Additional Long Term Goals Yes   PT LONG TERM GOAL #6   Title Patient will verbalize and demonstrate at least 3 correct body mechanics with  various tasks such as house cleaning/lifting/work tasks to reduce risk of injury to low back with ADLs by 08/28/15   Time 6   Period Weeks   Status New               Plan - 07/29/15 1420    Clinical Impression Statement Patient reports increased low back pain with radicular symptoms in RUE  and RLE; She reports no significant change in pain with HEP; She has been compliant doing pball exercises every night. Patient reports relying on heating pad when she first gets home. PT attempted lumbar traction in hooklying with manual distraction of lumbar spine x4 min; Patient reports less back/LE pain however upon sitting up had immediate return of tingling in BLE; PT applied TENs with moist heat to reduce discomfort. She responded well to TENs with less pain in hooklying position; Patient is still limited in gait after treatment session; She would benefit from additional  skilled PT intervention to reduce back pain;    Pt will benefit from skilled therapeutic intervention in order to improve on the following deficits Decreased endurance;Hypomobility;Decreased activity tolerance;Decreased strength;Pain;Difficulty walking;Decreased mobility;Decreased balance;Decreased range of motion;Improper body mechanics;Postural dysfunction;Impaired flexibility   Rehab Potential Fair   Clinical Impairments Affecting Rehab Potential positive: motivated; negative: chronic pain, weakness; Patient's clinical presentation is unstable as she has severe back pain with BLE radiculopathy; her pain varies in severity with different tasks and she exhibits decreased tolerance with most mobility;    PT Frequency 2x / week   PT Duration 6 weeks   PT Treatment/Interventions Aquatic Therapy;Cryotherapy;Electrical Stimulation;Moist Heat;Traction;Balance training;Therapeutic exercise;Therapeutic activities;Functional mobility training;Stair training;Gait training;DME Instruction;Ultrasound;Neuromuscular re-education;Patient/family education;Manual techniques;Energy conservation;Dry needling;Passive range of motion   PT Next Visit Plan work on lumbar flexion, manual therapy? maybe try traction or TENs   PT Home Exercise Plan continue as previously given but avoid lumbar trunk rotation to the right;   Consulted and Agree with Plan of  Care Patient        Problem List Patient Active Problem List   Diagnosis Date Noted  . Colitis 07/16/2013  . Acute renal failure ([place]) 07/16/2013  . HTN (hypertension) 07/16/2013  . Acute diverticulitis 07/16/2013  . Protein-calorie malnutrition, severe ([place]) 01/12/2013  . GIB (gastrointestinal bleeding) 01/07/2012  . Leukocytosis 01/07/2012  . Alcohol abuse 01/07/2012    Trotter,Margaret PT, DPT 07/30/2015, 8:21 AM  [place] [facility] SERVICES [address] [place], [place], [address] Phone: [phone]   Fax:  [phone]  Name: [name] MRN: [id number] Date of Birth: 23-Feb-1961

## 2015-07-31 ENCOUNTER — Encounter: Payer: Self-pay | Admitting: Physical Therapy

## 2015-07-31 ENCOUNTER — Ambulatory Visit: Payer: BLUE CROSS/BLUE SHIELD | Admitting: Physical Therapy

## 2015-07-31 DIAGNOSIS — M5441 Lumbago with sciatica, right side: Secondary | ICD-10-CM | POA: Diagnosis not present

## 2015-07-31 DIAGNOSIS — R531 Weakness: Secondary | ICD-10-CM

## 2015-07-31 DIAGNOSIS — M5442 Lumbago with sciatica, left side: Principal | ICD-10-CM

## 2015-07-31 DIAGNOSIS — R262 Difficulty in walking, not elsewhere classified: Secondary | ICD-10-CM

## 2015-07-31 NOTE — Patient Instructions (Addendum)
  Shoulder Retraction   Tie band around door knob (sitting or standing, holding band in both hands) Facing chest height anchor, grasp ends of band and pull hands to chest, squeezing shoulder blades together. Hold _3-5seconds. Repeat _10 times. Do _2_ sessions per day. Safety Note: Be sure anchor is secure.  Copyright  VHI. All rights reserved.  Strengthening: Resisted Extension   Hold band in both hands, Pull arm back, elbow straight, squeezing shoulder blades, Repeat _10___ times per set. Do _2___ sets per session. Do __1__ sessions per day.  http://orth.exer.us/833   Copyright  VHI. All rights reserved.     Roll   Inhale and bring shoulders up, back, then exhale and relax shoulders down. Repeat _10__ times. Do _2-3__ times per day.  Copyright  VHI. All rights reserved.    CHEST: Doorway, Bilateral - Standing    Standing in doorway, place hands on wall with elbows bent at shoulder height. Lean forward. Hold _15__ seconds. _2-3__ reps per set, __2-3_ sets per day, __5_ days per week  Copyright  VHI. All rights reserved.

## 2015-07-31 NOTE — Therapy (Signed)
[facility] MAIN [facility] SERVICES [address] [place], [place], [address] Phone: [phone]   Fax:  [phone]  Physical Therapy Treatment  Patient Details  Name: [name] MRN: [id number] Date of Birth: 1960-07-11 Referring Provider: Rockney Ghee NP  Encounter Date: 07/31/2015      PT End of Session - 07/31/15 1531    Visit Number 3   Number of Visits 13   Date for PT Re-Evaluation 08/28/15   PT Start Time 1515   PT Stop Time 1600   PT Time Calculation (min) 45 min   Activity Tolerance Patient limited by pain   Behavior During Therapy Restless      Past Medical History  Diagnosis Date  . Hypothyroidism   . Chronic kidney disease     stones  . GERD (gastroesophageal reflux disease)   . Diverticulitis   . Hypertension     somewhat controlled, taking medication  . Arthritis     hands, back, knees    Past Surgical History  Procedure Laterality Date  . Abdominal hysterectomy    . Cholecystectomy    . Appendectomy    . Esophagogastroduodenoscopy  01/07/2012    Procedure: ESOPHAGOGASTRODUODENOSCOPY (EGD);  Surgeon: Vertell Novak., MD;  Location: [facility] ENDOSCOPY;  Service: Endoscopy;  Laterality: N/A;  . Colonoscopy  01/09/2012    Procedure: COLONOSCOPY;  Surgeon: Shirley Friar, MD;  Location: [facility] ENDOSCOPY;  Service: Endoscopy;  Laterality: N/A;    There were no vitals filed for this visit.  Visit Diagnosis:  Bilateral low back pain with sciatica, sciatica laterality unspecified  Weakness  Difficulty walking      Subjective Assessment - 07/31/15 1530    Subjective "I am really hurting today" Patient reports severe upper back pain. She reports less radicular symptoms in LLE but still has tingling to right foot; She reports not having any pain meds that she can take to reduce back pain at work;    Pertinent History personal factors affecting rehab: chronic pain, job demands, frequent sleep disturbances, arthritis  (progresssive condition)   Limitations Lifting;Standing;Sitting   How long can you sit comfortably? 10 min   How long can you stand comfortably? stands for work and has to take pain pills to tolerate working;    How long can you walk comfortably? She reports needing something to hold onto when walking; she is not using any assistive device at this time but occasionally will use a stick when walking outside;    Diagnostic tests Has Had X-rays which showed decreasd disc height but otherwise no abnormality;    Patient Stated Goals "Get better"; Be able to walk outside; increase activity within the home; reduce back pain;    Currently in Pain? Yes   Pain Score 10-Worst pain ever   Pain Location Back   Pain Orientation Mid   Pain Descriptors / Indicators Spasm;Tightness;Stabbing;Throbbing;Aching;Sore   Pain Type Chronic pain   Pain Onset More than a month ago      TREATMENT: PT applied TENs (interferential) to upper and lower back, at tolerated intensity #9 x20 min concurrent with moist heat in hooklying position; Patient reports less pain and less discomfort with TENs; She was educated in log-roll technique to reduce pain with bed mobility; Pain after TENs treatment was 0/10; She was slow with ambulation due to discomfort.     PT initiated HEP: Standing red tband BUE: Shoulder extension x10; Shoulder rows x10; Patient required min Vcs to increase scapular retraction  and to reduce shoulder elevation with tband exercise;  Posterior shoulder rolls x10;  Doorway stretch 15 sed hold x2;  Also educated patient in standing against wall, lumbar posterior pelvic tilts with mod Vcs to avoid leaning shoulders forward and to increase diaphragmatic breathing for better core contraction;                      PT Education - 07/31/15 1531    Education provided Yes   Education Details TENs, strengthening, posture, need for activity to reduce discomfort;    Person(s) Educated  Patient   Methods Explanation;Verbal cues   Comprehension Verbalized understanding;Returned demonstration;Verbal cues required             PT Long Term Goals - 07/17/15 1032    PT LONG TERM GOAL #1   Title Patient will be independent in home exercise program to improve strength/mobility for better functional independence with ADLs. by 08/28/15   Time 6   Period Weeks   Status New   PT LONG TERM GOAL #2   Title Patient (< 76 years old) will complete five times sit to stand test in < 10 seconds indicating an increased LE strength and improved balance. by 08/28/15   Time 6   Period Weeks   Status New   PT LONG TERM GOAL #3   Title Patient will increase BLE gross strength to 4+/5 as to improve functional strength for independent gait, increased standing tolerance and increased ADL ability. by 08/28/15   Time 6   Period Weeks   Status New   PT LONG TERM GOAL #4   Title Patient will report a worst pain of 5/10 on VAS in    low back         to improve tolerance with ADLs and reduced symptoms with activities. by 08/28/15   Time 6   Period Weeks   Status New   PT LONG TERM GOAL #5   Title  Patient will reduce modified Oswestry score to <20 as to demonstrate minimal disability with ADLs including improved sleeping tolerance, walking/sitting tolerance etc for better mobility with ADLs. by 08/28/15   Time 6   Period Weeks   Status New   Additional Long Term Goals   Additional Long Term Goals Yes   PT LONG TERM GOAL #6   Title Patient will verbalize and demonstrate at least 3 correct body mechanics with  various tasks such as house cleaning/lifting/work tasks to reduce risk of injury to low back with ADLs by 08/28/15   Time 6   Period Weeks   Status New               Plan - 07/31/15 1531    Clinical Impression Statement PT applied TENs to patients mid to upper back (interferential) to reduce back discomfort. Patient reports no pain after TENs. PT then educated patient in upper  back strengthening for better posture and control. Also educated patient in standing back exercise to reduce pain while at work; She would benefit from additional skilled PT intervention to improve posture and reduce back pain;    Pt will benefit from skilled therapeutic intervention in order to improve on the following deficits Decreased endurance;Hypomobility;Decreased activity tolerance;Decreased strength;Pain;Difficulty walking;Decreased mobility;Decreased balance;Decreased range of motion;Improper body mechanics;Postural dysfunction;Impaired flexibility   Rehab Potential Fair   Clinical Impairments Affecting Rehab Potential positive: motivated; negative: chronic pain, weakness; Patient's clinical presentation is unstable as she has severe back pain with BLE radiculopathy; her  pain varies in severity with different tasks and she exhibits decreased tolerance with most mobility;    PT Frequency 2x / week   PT Duration 6 weeks   PT Treatment/Interventions Aquatic Therapy;Cryotherapy;Electrical Stimulation;Moist Heat;Traction;Balance training;Therapeutic exercise;Therapeutic activities;Functional mobility training;Stair training;Gait training;DME Instruction;Ultrasound;Neuromuscular re-education;Patient/family education;Manual techniques;Energy conservation;Dry needling;Passive range of motion   PT Next Visit Plan TENs, exercise   PT Home Exercise Plan upper back strengthening exercise, body mechanics   Consulted and Agree with Plan of Care Patient        Problem List Patient Active Problem List   Diagnosis Date Noted  . Colitis 07/16/2013  . Acute renal failure (HCC) 07/16/2013  . HTN (hypertension) 07/16/2013  . Acute diverticulitis 07/16/2013  . Protein-calorie malnutrition, severe (HCC) 01/12/2013  . GIB (gastrointestinal bleeding) 01/07/2012  . Leukocytosis 01/07/2012  . Alcohol abuse 01/07/2012    Jackelin Correia PT, DPT 07/31/2015, 4:01 PM  [facility] MAIN [facility] SERVICES [address] [place], [place], [address] Phone: [phone]   Fax:  [phone]  Name: [name] MRN: [id number] Date of Birth: [birth year]

## 2015-08-05 ENCOUNTER — Ambulatory Visit: Payer: BLUE CROSS/BLUE SHIELD | Admitting: Physical Therapy

## 2015-08-07 ENCOUNTER — Encounter: Payer: Self-pay | Admitting: Physical Therapy

## 2015-08-07 ENCOUNTER — Ambulatory Visit: Payer: BLUE CROSS/BLUE SHIELD | Attending: Nurse Practitioner | Admitting: Physical Therapy

## 2015-08-07 DIAGNOSIS — R262 Difficulty in walking, not elsewhere classified: Secondary | ICD-10-CM | POA: Diagnosis present

## 2015-08-07 DIAGNOSIS — M5442 Lumbago with sciatica, left side: Secondary | ICD-10-CM | POA: Diagnosis present

## 2015-08-07 DIAGNOSIS — R531 Weakness: Secondary | ICD-10-CM

## 2015-08-07 DIAGNOSIS — M5441 Lumbago with sciatica, right side: Secondary | ICD-10-CM | POA: Diagnosis not present

## 2015-08-07 NOTE — Therapy (Signed)
[facility] MAIN [facility] SERVICES [address] [place], [place], [address] Phone: [phone]   Fax:  [phone]  Physical Therapy Treatment  Patient Details  Name: [name] MRN: [id number] Date of Birth: 07-04-1960 Referring Provider: Rockney Ghee NP  Encounter Date: 08/07/2015      PT End of Session - 08/07/15 1606    Visit Number 4   Number of Visits 13   Date for PT Re-Evaluation 08/28/15   PT Start Time 1515   PT Stop Time 1600   PT Time Calculation (min) 45 min   Activity Tolerance Patient tolerated treatment well   Behavior During Therapy [facility] for tasks assessed/performed      Past Medical History  Diagnosis Date  . Hypothyroidism   . Chronic kidney disease     stones  . GERD (gastroesophageal reflux disease)   . Diverticulitis   . Hypertension     somewhat controlled, taking medication  . Arthritis     hands, back, knees    Past Surgical History  Procedure Laterality Date  . Abdominal hysterectomy    . Cholecystectomy    . Appendectomy    . Esophagogastroduodenoscopy  01/07/2012    Procedure: ESOPHAGOGASTRODUODENOSCOPY (EGD);  Surgeon: Vertell Novak., MD;  Location: [facility] ENDOSCOPY;  Service: Endoscopy;  Laterality: N/A;  . Colonoscopy  01/09/2012    Procedure: COLONOSCOPY;  Surgeon: Shirley Friar, MD;  Location: [facility] ENDOSCOPY;  Service: Endoscopy;  Laterality: N/A;    There were no vitals filed for this visit.  Visit Diagnosis:  Bilateral low back pain with sciatica, sciatica laterality unspecified  Weakness  Difficulty walking      Subjective Assessment - 08/07/15 1524    Subjective Patient reports less back discomfort with new tband exercise. She reports doing them at work regularly with good results. Patient reports also getting pain meds which she has been taking at work to reduce pain;    Pertinent History personal factors affecting rehab: chronic pain, job demands, frequent sleep disturbances,  arthritis (progresssive condition)   Limitations Lifting;Standing;Sitting   How long can you sit comfortably? 10 min   How long can you stand comfortably? stands for work and has to take pain pills to tolerate working;    How long can you walk comfortably? She reports needing something to hold onto when walking; she is not using any assistive device at this time but occasionally will use a stick when walking outside;    Diagnostic tests Has Had X-rays which showed decreasd disc height but otherwise no abnormality;    Patient Stated Goals "Get better"; Be able to walk outside; increase activity within the home; reduce back pain;    Currently in Pain? No/denies   Pain Onset More than a month ago        Warm up on Nustep level 2 BUE/BLE x4 min (Unbilled);   PT instructed patient in scapular retraction exercise: HOIST low row, plate #1, [id number] with mod Vcs to increase scapular retraction with UE movement; HOIST lat pull down plate #2, X52 with min Vcs to increase stretch at end range for improved tissue extensibility;  Qped: Cat/camel stretch 5 sec hold x5 each direction; Marjo Bicker pose 15 sec hold x2; Patient required mod Vcs for correct positioning with cat/camel stretch having difficulty with thoracic flexion;  Prone over pball: Alternate UE flexion x10 bilaterally; UE rows 2# x10 bilaterally; with mod VCs to increase scapular retraction BUE horizontal abduction 2# x10 with min A  for balance;  Patient required min-moderate verbal/tactile cues for correct exercise technique including to improve scapular retraction with advanced exercise;  She denies any increase in pain with advanced strengthening;  PT finished with moist heat to low back x5 min with HEP instruction;  PT also educated patient in correct sitting posture including to use a towel roll when sitting for better erect posture;                           PT Education - 08/07/15 1606    Education provided  Yes   Education Details HEP, sitting posture, upper back strengthening   Person(s) Educated Patient   Methods Explanation;Verbal cues   Comprehension Verbalized understanding;Returned demonstration;Verbal cues required             PT Long Term Goals - 07/17/15 1032    PT LONG TERM GOAL #1   Title Patient will be independent in home exercise program to improve strength/mobility for better functional independence with ADLs. by 08/28/15   Time 6   Period Weeks   Status New   PT LONG TERM GOAL #2   Title Patient (< 27 years old) will complete five times sit to stand test in < 10 seconds indicating an increased LE strength and improved balance. by 08/28/15   Time 6   Period Weeks   Status New   PT LONG TERM GOAL #3   Title Patient will increase BLE gross strength to 4+/5 as to improve functional strength for independent gait, increased standing tolerance and increased ADL ability. by 08/28/15   Time 6   Period Weeks   Status New   PT LONG TERM GOAL #4   Title Patient will report a worst pain of 5/10 on VAS in    low back         to improve tolerance with ADLs and reduced symptoms with activities. by 08/28/15   Time 6   Period Weeks   Status New   PT LONG TERM GOAL #5   Title  Patient will reduce modified Oswestry score to <20 as to demonstrate minimal disability with ADLs including improved sleeping tolerance, walking/sitting tolerance etc for better mobility with ADLs. by 08/28/15   Time 6   Period Weeks   Status New   Additional Long Term Goals   Additional Long Term Goals Yes   PT LONG TERM GOAL #6   Title Patient will verbalize and demonstrate at least 3 correct body mechanics with  various tasks such as house cleaning/lifting/work tasks to reduce risk of injury to low back with ADLs by 08/28/15   Time 6   Period Weeks   Status New               Plan - 08/07/15 1606    Clinical Impression Statement Instructed patient in upper back/postural strengthening exercise. She  tolerated exercise well without an increase in pain. Patient continues to require min VCs to increase scapular retraction with UE movement. Patient would benefit from additional skilled PT intervention to improve back strength and reduce pain;    Pt will benefit from skilled therapeutic intervention in order to improve on the following deficits Decreased endurance;Hypomobility;Decreased activity tolerance;Decreased strength;Pain;Difficulty walking;Decreased mobility;Decreased balance;Decreased range of motion;Improper body mechanics;Postural dysfunction;Impaired flexibility   Rehab Potential Fair   Clinical Impairments Affecting Rehab Potential positive: motivated; negative: chronic pain, weakness; Patient's clinical presentation is unstable as she has severe back pain with BLE radiculopathy; her  pain varies in severity with different tasks and she exhibits decreased tolerance with most mobility;    PT Frequency 2x / week   PT Duration 6 weeks   PT Treatment/Interventions Aquatic Therapy;Cryotherapy;Electrical Stimulation;Moist Heat;Traction;Balance training;Therapeutic exercise;Therapeutic activities;Functional mobility training;Stair training;Gait training;DME Instruction;Ultrasound;Neuromuscular re-education;Patient/family education;Manual techniques;Energy conservation;Dry needling;Passive range of motion   PT Next Visit Plan postural strengthening, lumbar extension;    PT Home Exercise Plan advanced- see patient instructions;    Consulted and Agree with Plan of Care Patient        Problem List Patient Active Problem List   Diagnosis Date Noted  . Colitis 07/16/2013  . Acute renal failure (HCC) 07/16/2013  . HTN (hypertension) 07/16/2013  . Acute diverticulitis 07/16/2013  . Protein-calorie malnutrition, severe (HCC) 01/12/2013  . GIB (gastrointestinal bleeding) 01/07/2012  . Leukocytosis 01/07/2012  . Alcohol abuse 01/07/2012    Brodie Scovell PT, DPT 08/07/2015, 4:08 PM  [facility] MAIN [facility] SERVICES [address] [place], [place], [address] Phone: [phone]   Fax:  [phone]  Name: [name] MRN: [id number] Date of Birth: Jul 26, 1960

## 2015-08-07 NOTE — Patient Instructions (Addendum)
  Spinal Mobility (Cat / Camel): Flexion / Extension    Get on hands/knees (on bed or floor) (not on bolsters) Round back, while looking down hold 5 sec, then arch back while looking up x5 sec; Repeat 5 times each direction;  Copyright  VHI. All rights reserved.  Child Pose    Sitting on knees, fold body over legs and relax head and arms on floor. Hold for _15 sec; repeat 2-3 times;  http://yg.exer.us/127   Copyright  VHI. All rights reserved.  DEVELOPMENTAL POSITION: Quadruped Alternate Shoulder Flexion    Kneeling over ball, Alternate lifting one arm up and then the other, Use water bottle/soup can for resistance if needed; Repeat 10 times each direction;  Copyright  VHI. All rights reserved.  Scapular: Retraction (Prone)    Kneeling over ball: Holding water bottle or soup cane, bring one arm back (low row) squeezing shoulder blade, then do the other; Repeat 10 times each arm;  http://orth.exer.us/863   Copyright  VHI. All rights reserved.  Scapular Retraction: "T" (Eccentric) - Prone (Ball)    Lie over ball .  lift arms into "T", thumbs up. Squeeze shoulder blades. Slowly lower for 3-5 seconds. Keep head in line with spine. _10__ reps per set, _1-2__ sets per day, _5__ days per week. Add _1__ lbs (soup can/water bottle) http://ecce.exer.us/221   Copyright  VHI. All rights reserved.

## 2015-08-12 ENCOUNTER — Encounter: Payer: Self-pay | Admitting: Physical Therapy

## 2015-08-12 ENCOUNTER — Ambulatory Visit: Payer: BLUE CROSS/BLUE SHIELD | Admitting: Physical Therapy

## 2015-08-12 DIAGNOSIS — M5442 Lumbago with sciatica, left side: Principal | ICD-10-CM

## 2015-08-12 DIAGNOSIS — M5441 Lumbago with sciatica, right side: Secondary | ICD-10-CM

## 2015-08-12 DIAGNOSIS — R262 Difficulty in walking, not elsewhere classified: Secondary | ICD-10-CM

## 2015-08-12 DIAGNOSIS — R531 Weakness: Secondary | ICD-10-CM

## 2015-08-12 NOTE — Therapy (Signed)
week   PT Duration 6 weeks   PT Treatment/Interventions Aquatic Therapy;Cryotherapy;Electrical Stimulation;Moist Heat;Traction;Balance training;Therapeutic exercise;Therapeutic activities;Functional mobility training;Stair training;Gait training;DME Instruction;Ultrasound;Neuromuscular re-education;Patient/family education;Manual techniques;Energy conservation;Dry needling;Passive range of motion   PT Next Visit Plan postural strengthening, lumbar extension;    PT Home Exercise Plan continue as previously given;    Consulted and Agree with Plan of Care Patient        Problem List Patient Active Problem List   Diagnosis Date Noted  . Colitis 07/16/2013  . Acute renal failure ([facility]) 07/16/2013  . HTN (hypertension) 07/16/2013  . Acute diverticulitis 07/16/2013  . Protein-calorie malnutrition, severe ([place]) 01/12/2013  . GIB (gastrointestinal bleeding) 01/07/2012  . Leukocytosis 01/07/2012  . Alcohol abuse 01/07/2012    Kristell Wooding PT, DPT 08/12/2015, 3:30 PM  [place] MAIN [facility] SERVICES [address] [place], [place], [address] Phone: [phone]   Fax:   [phone]  Name: [name] MRN: [id number] Date of Birth: 11-Aug-1960  week   PT Duration 6 weeks   PT Treatment/Interventions Aquatic Therapy;Cryotherapy;Electrical Stimulation;Moist Heat;Traction;Balance training;Therapeutic exercise;Therapeutic activities;Functional mobility training;Stair training;Gait training;DME Instruction;Ultrasound;Neuromuscular re-education;Patient/family education;Manual techniques;Energy conservation;Dry needling;Passive range of motion   PT Next Visit Plan postural strengthening, lumbar extension;    PT Home Exercise Plan continue as previously given;    Consulted and Agree with Plan of Care Patient        Problem List Patient Active Problem List   Diagnosis Date Noted  . Colitis 07/16/2013  . Acute renal failure ([facility]) 07/16/2013  . HTN (hypertension) 07/16/2013  . Acute diverticulitis 07/16/2013  . Protein-calorie malnutrition, severe ([place]) 01/12/2013  . GIB (gastrointestinal bleeding) 01/07/2012  . Leukocytosis 01/07/2012  . Alcohol abuse 01/07/2012    Kristell Wooding PT, DPT 08/12/2015, 3:30 PM  [place] MAIN [facility] SERVICES [address] [place], [place], [address] Phone: [phone]   Fax:   [phone]  Name: [name] MRN: [id number] Date of Birth: 11-Aug-1960  [place] MAIN [facility] SERVICES [address] [place], [place], [address] Phone: [phone]   Fax:  [phone]  Physical Therapy Treatment  Patient Details  Name: [name] MRN: [id number] Date of Birth: 1960/08/08 Referring Provider: Jerolyn Center NP  Encounter Date: 08/12/2015      PT End of Session - 08/12/15 1434    Visit Number 5   Number of Visits 13   Date for PT Re-Evaluation 08/28/15   PT Start Time [id number]   PT Stop Time 1430   PT Time Calculation (min) 48 min   Activity Tolerance Patient tolerated treatment well   Behavior During Therapy [facility] for tasks assessed/performed      Past Medical History  Diagnosis Date  . Hypothyroidism   . Chronic kidney disease     stones  . GERD (gastroesophageal reflux disease)   . Diverticulitis   . Hypertension     somewhat controlled, taking medication  . Arthritis     hands, back, knees    Past Surgical History  Procedure Laterality Date  . Abdominal hysterectomy    . Cholecystectomy    . Appendectomy    . Esophagogastroduodenoscopy  01/07/2012    Procedure: ESOPHAGOGASTRODUODENOSCOPY (EGD);  Surgeon: Winfield Cunas., MD;  Location: [facility] ENDOSCOPY;  Service: Endoscopy;  Laterality: N/A;  . Colonoscopy  01/09/2012    Procedure: COLONOSCOPY;  Surgeon: Lear Ng, MD;  Location: [facility] ENDOSCOPY;  Service: Endoscopy;  Laterality: N/A;    There were no vitals filed for this visit.  Visit Diagnosis:  Bilateral low back pain with sciatica, sciatica laterality unspecified  Weakness  Difficulty walking      Subjective Assessment - 08/12/15 1350    Subjective Patient reports not taking her pain medicine today and is having more discomfort; Reports compliance with HEP however continues to have difficulty with pball exercise;    Pertinent History personal factors affecting rehab: chronic pain, job demands, frequent sleep disturbances, arthritis (progresssive condition)    Limitations Lifting;Standing;Sitting   How long can you sit comfortably? 10 min   How long can you stand comfortably? stands for work and has to take pain pills to tolerate working;    How long can you walk comfortably? She reports needing something to hold onto when walking; she is not using any assistive device at this time but occasionally will use a stick when walking outside;    Diagnostic tests Has Had X-rays which showed decreasd disc height but otherwise no abnormality;    Patient Stated Goals "Get better"; Be able to walk outside; increase activity within the home; reduce back pain;    Currently in Pain? Yes   Pain Score 6    Pain Location Back   Pain Orientation Mid   Pain Descriptors / Indicators Aching;Sore;Tightness   Pain Type Chronic pain   Pain Onset More than a month ago       TREATMENT:  Warm up on Nustep level 2 BUE/BLE x5 min (Unbilled);   PT instructed patient in scapular retraction exercise: HOIST low row, plate #2, [id number] with mod Vcs to increase scapular retraction with UE movement; HOIST lat pull down plate #2, [id number] with min Vcs to increase stretch at end range for improved tissue extensibility; Doorway stretch 15 sec hold x2;  Standing facing wall: BUE horizontal abduction 2# x10; BUE overhead press "V" scapular retraction 2# x10 bilaterally;  Prone: PT performed grade II PA mobs along thoracic spine, T4-T10 10 sec bouts x2

## 2015-08-14 ENCOUNTER — Ambulatory Visit: Payer: BLUE CROSS/BLUE SHIELD | Admitting: Physical Therapy

## 2015-08-19 ENCOUNTER — Encounter: Payer: Self-pay | Admitting: Physical Therapy

## 2015-08-19 ENCOUNTER — Ambulatory Visit: Payer: BLUE CROSS/BLUE SHIELD | Admitting: Physical Therapy

## 2015-08-19 DIAGNOSIS — M5442 Lumbago with sciatica, left side: Principal | ICD-10-CM

## 2015-08-19 DIAGNOSIS — M5441 Lumbago with sciatica, right side: Secondary | ICD-10-CM

## 2015-08-19 DIAGNOSIS — R262 Difficulty in walking, not elsewhere classified: Secondary | ICD-10-CM

## 2015-08-19 DIAGNOSIS — R531 Weakness: Secondary | ICD-10-CM

## 2015-08-19 NOTE — Therapy (Signed)
[place] MAIN [facility] SERVICES [address] [place], [place], [address] Phone: [phone]   Fax:  [phone]  Physical Therapy Treatment/Progress Note 07/15/15- 08/19/15  Patient Details  Name: [name] MRN: [id number] Date of Birth: 03/19/1961 Referring Provider: Jerolyn Center NP  Encounter Date: 08/19/2015      PT End of Session - 08/19/15 1349    Visit Number 6   Number of Visits 17   Date for PT Re-Evaluation 09/09/15   PT Start Time 1344   PT Stop Time 7915   PT Time Calculation (min) 55 min   Activity Tolerance Patient tolerated treatment well;No increased pain   Behavior During Therapy [facility] for tasks assessed/performed      Past Medical History  Diagnosis Date  . Hypothyroidism   . Chronic kidney disease     stones  . GERD (gastroesophageal reflux disease)   . Diverticulitis   . Hypertension     somewhat controlled, taking medication  . Arthritis     hands, back, knees    Past Surgical History  Procedure Laterality Date  . Abdominal hysterectomy    . Cholecystectomy    . Appendectomy    . Esophagogastroduodenoscopy  01/07/2012    Procedure: ESOPHAGOGASTRODUODENOSCOPY (EGD);  Surgeon: Winfield Cunas., MD;  Location: [facility] ENDOSCOPY;  Service: Endoscopy;  Laterality: N/A;  . Colonoscopy  01/09/2012    Procedure: COLONOSCOPY;  Surgeon: Lear Ng, MD;  Location: [facility] ENDOSCOPY;  Service: Endoscopy;  Laterality: N/A;    There were no vitals filed for this visit.  Visit Diagnosis:  Bilateral low back pain with sciatica, sciatica laterality unspecified - Plan: PT plan of care cert/re-cert  Difficulty walking - Plan: PT plan of care cert/re-cert  Weakness - Plan: PT plan of care cert/re-cert      Subjective Assessment - 08/19/15 1348    Subjective Patient reports feeling increased fatigue today; She reports less back pain overall; She reports most pain in her mid back;    Pertinent History personal  factors affecting rehab: chronic pain, job demands, frequent sleep disturbances, arthritis (progresssive condition)   Limitations Lifting;Standing;Sitting   How long can you sit comfortably? 10 min   How long can you stand comfortably? stands for work and has to take pain pills to tolerate working;    How long can you walk comfortably? She reports needing something to hold onto when walking; she is not using any assistive device at this time but occasionally will use a stick when walking outside;    Diagnostic tests Has Had X-rays which showed decreasd disc height but otherwise no abnormality;    Patient Stated Goals "Get better"; Be able to walk outside; increase activity within the home; reduce back pain;    Currently in Pain? Yes   Pain Score 6    Pain Location Back   Pain Orientation Mid   Pain Descriptors / Indicators Aching;Sore;Tightness   Pain Type Chronic pain   Pain Onset More than a month ago            [facility] PT Assessment - 08/19/15 0001    Observation/Other Assessments   Modified Oswertry 34% (moderate disability, improved from initial eval on 07/17/15 which was 58%)   Strength   Right Hip Flexion 4-/5   Right Hip ABduction 4/5   Right Hip ADduction 4/5   Left Hip Flexion 4/5   Left Hip ABduction 4/5   Left Hip ADduction 4/5   Right Knee Flexion  4-/5   Right Knee Extension 4/5   Left Knee Flexion 4-/5   Left Knee Extension 4/5   Right Ankle Dorsiflexion 4/5   Right Ankle Plantar Flexion 4/5   Left Ankle Dorsiflexion 4/5   Left Ankle Plantar Flexion 4/5   Standardized Balance Assessment   Five times sit to stand comments  12.5 sec without HHA; >10 sec indicates increased risk for falls; improved from initial eval on 07/17/15 which was 42 sec;        TREATMENT:  Warm up on UBE level 2 BUE backwards only x3 min (Unbilled);   PT instructed patient in scapular retraction exercise: HOIST low row, plate #2, 1E07 with mod Vcs to increase scapular retraction with UE  movement; HOIST lat pull down plate #2, H21 with min Vcs to increase stretch at end range for improved tissue extensibility;  Instructed patient in Modified oswestry and other outcome measures to assess progress towards goals; Please see above.  Patient prone: BUE shoulder extensoin 2# x12, BUE shoulder horizontal abduction x20; BUE mid row 2# x12 bilaterally;  Prone on elbows, scapular protraction x15   Patient required min-moderate verbal/tactile cues for correct exercise technique including cues to increase scapular retraction;   Finished with Moist heat concurrent with interferential TENs to upper back x20 min at tolerated intensity; Patient reports no pain after treatment session;                        PT Education - 08/19/15 1359    Education provided Yes   Education Details strengthening exercise; progress towards goals;    Person(s) Educated Patient   Methods Explanation;Verbal cues   Comprehension Returned demonstration;Verbalized understanding;Verbal cues required             PT Long Term Goals - 08/19/15 1350    PT LONG TERM GOAL #1   Title Patient will be independent in home exercise program to improve strength/mobility for better functional independence with ADLs. by 09/09/15   Time 6   Period Weeks   Status On-going   PT LONG TERM GOAL #2   Title Patient (< 53 years old) will complete five times sit to stand test in < 10 seconds indicating an increased LE strength and improved balance. by 09/09/15   Time 6   Period Weeks   Status Partially Met   PT LONG TERM GOAL #3   Title Patient will increase BLE gross strength to 4+/5 as to improve functional strength for independent gait, increased standing tolerance and increased ADL ability. by 09/09/15   Time 6   Period Weeks   Status Partially Met   PT LONG TERM GOAL #4   Title Patient will report a worst pain of 5/10 on VAS in    low back         to improve tolerance with ADLs and reduced symptoms  with activities. by 09/09/15   Time 6   Period Weeks   Status Partially Met   PT LONG TERM GOAL #5   Title  Patient will reduce modified Oswestry score to <20 as to demonstrate minimal disability with ADLs including improved sleeping tolerance, walking/sitting tolerance etc for better mobility with ADLs. by 09/09/15   Time 6   Period Weeks   Status Partially Met   PT LONG TERM GOAL #6   Title Patient will verbalize and demonstrate at least 3 correct body mechanics with  various tasks such as house cleaning/lifting/work tasks to reduce risk  of injury to low back with ADLs by 09/09/15   Time 6   Period Weeks   Status Not Met               Plan - 08/19/15 1444    Clinical Impression Statement Instructed patient in upper back/postural strengthening. Advanced exercise with increased repetition/resistance. Patient demonstrates improved functional mobility with decreased oswestry score and improved outcomes. See attached; She continues to have pain with work tasks and would benefit from skilled PT intervention working on IT trainer;    Pt will benefit from skilled therapeutic intervention in order to improve on the following deficits Decreased endurance;Hypomobility;Decreased activity tolerance;Decreased strength;Pain;Difficulty walking;Decreased mobility;Decreased balance;Decreased range of motion;Improper body mechanics;Postural dysfunction;Impaired flexibility   Rehab Potential Fair   Clinical Impairments Affecting Rehab Potential positive: motivated; negative: chronic pain, weakness; Patient's clinical presentation is unstable as she has severe back pain with BLE radiculopathy; her pain varies in severity with different tasks and she exhibits decreased tolerance with most mobility;    PT Frequency 2x / week   PT Duration 3 weeks   PT Treatment/Interventions Aquatic Therapy;Cryotherapy;Electrical Stimulation;Moist Heat;Traction;Balance training;Therapeutic  exercise;Therapeutic activities;Functional mobility training;Stair training;Gait training;DME Instruction;Ultrasound;Neuromuscular re-education;Patient/family education;Manual techniques;Energy conservation;Dry needling;Passive range of motion   PT Next Visit Plan postural strengthening, lumbar extension;    PT Home Exercise Plan continue as previously given;    Consulted and Agree with Plan of Care Patient        Problem List Patient Active Problem List   Diagnosis Date Noted  . Colitis 07/16/2013  . Acute renal failure ([place]) 07/16/2013  . HTN (hypertension) 07/16/2013  . Acute diverticulitis 07/16/2013  . Protein-calorie malnutrition, severe ([place]) 01/12/2013  . GIB (gastrointestinal bleeding) 01/07/2012  . Leukocytosis 01/07/2012  . Alcohol abuse 01/07/2012    Trotter,Margaret PT, DPT 08/19/2015, 4:15 PM  [place] MAIN [facility] SERVICES [address] [place], [place], [address] Phone: [phone]   Fax:  [phone]  Name: NANNETTE ZILL MRN: [id number] Date of Birth: 04-20-1961

## 2015-08-21 ENCOUNTER — Encounter: Payer: Self-pay | Admitting: Physical Therapy

## 2015-08-21 ENCOUNTER — Ambulatory Visit: Payer: BLUE CROSS/BLUE SHIELD | Admitting: Physical Therapy

## 2015-08-21 DIAGNOSIS — R531 Weakness: Secondary | ICD-10-CM

## 2015-08-21 DIAGNOSIS — R262 Difficulty in walking, not elsewhere classified: Secondary | ICD-10-CM

## 2015-08-21 DIAGNOSIS — M5441 Lumbago with sciatica, right side: Secondary | ICD-10-CM

## 2015-08-21 DIAGNOSIS — M5442 Lumbago with sciatica, left side: Secondary | ICD-10-CM

## 2015-08-21 NOTE — Therapy (Signed)
[place] MAIN [facility] SERVICES [address] [place], [place], [address] Phone: [phone]   Fax:  [phone]  Physical Therapy Treatment  Patient Details  Name: [name] MRN: [id number] Date of Birth: January 05, 1961 Referring Provider: Jerolyn Center NP  Encounter Date: 08/21/2015      PT End of Session - 08/21/15 1715    Visit Number 7   Number of Visits 17   Date for PT Re-Evaluation 09/09/15   PT Start Time 8250   PT Stop Time 1432   PT Time Calculation (min) 45 min   Equipment Utilized During Treatment Gait belt   Activity Tolerance Patient tolerated treatment well;No increased pain   Behavior During Therapy [facility] for tasks assessed/performed      Past Medical History  Diagnosis Date  . Hypothyroidism   . Chronic kidney disease     stones  . GERD (gastroesophageal reflux disease)   . Diverticulitis   . Hypertension     somewhat controlled, taking medication  . Arthritis     hands, back, knees    Past Surgical History  Procedure Laterality Date  . Abdominal hysterectomy    . Cholecystectomy    . Appendectomy    . Esophagogastroduodenoscopy  01/07/2012    Procedure: ESOPHAGOGASTRODUODENOSCOPY (EGD);  Surgeon: Winfield Cunas., MD;  Location: [facility] ENDOSCOPY;  Service: Endoscopy;  Laterality: N/A;  . Colonoscopy  01/09/2012    Procedure: COLONOSCOPY;  Surgeon: Lear Ng, MD;  Location: [facility] ENDOSCOPY;  Service: Endoscopy;  Laterality: N/A;    There were no vitals filed for this visit.  Visit Diagnosis:  Weakness  Bilateral low back pain with sciatica, sciatica laterality unspecified  Difficulty walking      Subjective Assessment - 08/21/15 1354    Subjective Pt reports she is having continued back pain, from mid to lower back.  She reports not being able to do the exercises "laying on the ball" , but she has been doing the rest of her HEP. Standing and sitting tolerance remains about the same.   Pertinent  History personal factors affecting rehab: chronic pain, job demands, frequent sleep disturbances, arthritis (progresssive condition)   Limitations Lifting;Standing;Sitting   How long can you sit comfortably? 10 min   How long can you stand comfortably? stands for work and has to take pain pills to tolerate working;    How long can you walk comfortably? She reports needing something to hold onto when walking; she is not using any assistive device at this time but occasionally will use a stick when walking outside;    Diagnostic tests Has Had X-rays which showed decreasd disc height but otherwise no abnormality;    Patient Stated Goals "Get better"; Be able to walk outside; increase activity within the home; reduce back pain;    Currently in Pain? Yes   Pain Score 6   mid to lower back   Pain Descriptors / Indicators Aching   Pain Type Chronic pain   Pain Onset More than a month ago     Objective  Warm up: B UE UBE L2 x4 minutes (unbilled)  Therex:  Matrix low rows 7.5# 2x10 B UE Matrix mid rows 7.5# 2x10 B UE Standing B UE horizontal shoulder abduction 2# 2x10 (used mirror for visual cues for technique/form) Standing B UE shoulder elevation in scapular plane 2# 2x10 (used mirror for visual cues for technique/form) For increased scapular and postural strength and reduction of pain. Provided cues for scapular  retraction and proper form of exercises.   Theract: Provided written handout for proper lifting techniques and practiced with pt: squat lift, half kneel lift, and golfer's lift. Reviewed and practiced each lift and educated pt on major points of avoiding twisting, always keep object close to body, tighten abdominal muscles to support spine, maintaining lumbar arch, and to use legs instead of back when at all possible. PT and pt discussed how these techniques apply to work and household chores. Pt demonstrated understanding of lifting  techniques.                           PT Education - 08/21/15 1714    Education Details lifting techniques; continue HEP   Person(s) Educated Patient   Methods Explanation;Demonstration;Verbal cues;Tactile cues   Comprehension Verbalized understanding;Returned demonstration             PT Long Term Goals - 08/19/15 1350    PT LONG TERM GOAL #1   Title Patient will be independent in home exercise program to improve strength/mobility for better functional independence with ADLs. by 09/09/15   Time 6   Period Weeks   Status On-going   PT LONG TERM GOAL #2   Title Patient (< 70 years old) will complete five times sit to stand test in < 10 seconds indicating an increased LE strength and improved balance. by 09/09/15   Time 6   Period Weeks   Status Partially Met   PT LONG TERM GOAL #3   Title Patient will increase BLE gross strength to 4+/5 as to improve functional strength for independent gait, increased standing tolerance and increased ADL ability. by 09/09/15   Time 6   Period Weeks   Status Partially Met   PT LONG TERM GOAL #4   Title Patient will report a worst pain of 5/10 on VAS in    low back         to improve tolerance with ADLs and reduced symptoms with activities. by 09/09/15   Time 6   Period Weeks   Status Partially Met   PT LONG TERM GOAL #5   Title  Patient will reduce modified Oswestry score to <20 as to demonstrate minimal disability with ADLs including improved sleeping tolerance, walking/sitting tolerance etc for better mobility with ADLs. by 09/09/15   Time 6   Period Weeks   Status Partially Met   PT LONG TERM GOAL #6   Title Patient will verbalize and demonstrate at least 3 correct body mechanics with  various tasks such as house cleaning/lifting/work tasks to reduce risk of injury to low back with ADLs by 09/09/15   Time 6   Period Weeks   Status Not Met               Plan - 08/21/15 1717    Clinical Impression Statement Pt  performed upper back/postural strengthing exercises with no c/o increased pain. Pt stated, "this feels pretty good". After education and practice of proper lifting techniques with pt, she has an increased understanding of how to protect her back and perform work and household tasks to reduce back pain.  At the end of the session pt reports no pain.   Pt will benefit from skilled therapeutic intervention in order to improve on the following deficits Decreased endurance;Hypomobility;Decreased activity tolerance;Decreased strength;Pain;Difficulty walking;Decreased mobility;Decreased balance;Decreased range of motion;Improper body mechanics;Postural dysfunction;Impaired flexibility   Rehab Potential Fair   Clinical Impairments Affecting Rehab Potential positive:  motivated; negative: chronic pain, weakness; Patient's clinical presentation is unstable as she has severe back pain with BLE radiculopathy; her pain varies in severity with different tasks and she exhibits decreased tolerance with most mobility;    PT Frequency 2x / week   PT Duration 3 weeks   PT Treatment/Interventions Aquatic Therapy;Cryotherapy;Electrical Stimulation;Moist Heat;Traction;Balance training;Therapeutic exercise;Therapeutic activities;Functional mobility training;Stair training;Gait training;DME Instruction;Ultrasound;Neuromuscular re-education;Patient/family education;Manual techniques;Energy conservation;Dry needling;Passive range of motion   PT Next Visit Plan postural strengthening, lumbar extension; review lifting techniques for carryover   PT Home Exercise Plan continue as previously given;    Consulted and Agree with Plan of Care Patient        Problem List Patient Active Problem List   Diagnosis Date Noted  . Colitis 07/16/2013  . Acute renal failure ([place]) 07/16/2013  . HTN (hypertension) 07/16/2013  . Acute diverticulitis 07/16/2013  . Protein-calorie malnutrition, severe ([place]) 01/12/2013  . GIB (gastrointestinal  bleeding) 01/07/2012  . Leukocytosis 01/07/2012  . Alcohol abuse 01/07/2012    Neoma Laming, PT, DPT  08/21/2015, 5:31 PM [place] MAIN [facility] SERVICES [address] [place], [place], [address] Phone: [phone]   Fax:  [phone]  Name: [name] MRN: [id number] Date of Birth: [birth year]

## 2015-08-26 ENCOUNTER — Ambulatory Visit: Payer: BLUE CROSS/BLUE SHIELD | Admitting: Physical Therapy

## 2015-08-26 ENCOUNTER — Encounter: Payer: Self-pay | Admitting: Physical Therapy

## 2015-08-26 DIAGNOSIS — M5441 Lumbago with sciatica, right side: Secondary | ICD-10-CM

## 2015-08-26 DIAGNOSIS — R262 Difficulty in walking, not elsewhere classified: Secondary | ICD-10-CM

## 2015-08-26 DIAGNOSIS — M5442 Lumbago with sciatica, left side: Secondary | ICD-10-CM

## 2015-08-26 DIAGNOSIS — R531 Weakness: Secondary | ICD-10-CM

## 2015-08-26 NOTE — Therapy (Signed)
[facility] MAIN [address] [facility] SERVICES [address] [place], [place], [address] Phone: [phone]   Fax:  [phone]  Physical Therapy Treatment  Patient Details  Name: [name] MRN: [id number] Date of Birth: [birth year] Referring Provider: Rockney Ghee NP  Encounter Date: 08/26/2015      PT End of Session - 08/26/15 1407    Visit Number 8   Number of Visits 17   Date for PT Re-Evaluation 09/09/15   PT Start Time 1348   PT Stop Time 1430   PT Time Calculation (min) 42 min   Equipment Utilized During Treatment Gait belt   Activity Tolerance Patient tolerated treatment well;No increased pain   Behavior During Therapy [facility] for tasks assessed/performed      Past Medical History  Diagnosis Date  . Hypothyroidism   . Chronic kidney disease     stones  . GERD (gastroesophageal reflux disease)   . Diverticulitis   . Hypertension     somewhat controlled, taking medication  . Arthritis     hands, back, knees    Past Surgical History  Procedure Laterality Date  . Abdominal hysterectomy    . Cholecystectomy    . Appendectomy    . Esophagogastroduodenoscopy  01/07/2012    Procedure: ESOPHAGOGASTRODUODENOSCOPY (EGD);  Surgeon: Vertell Novak., MD;  Location: [facility] ENDOSCOPY;  Service: Endoscopy;  Laterality: N/A;  . Colonoscopy  01/09/2012    Procedure: COLONOSCOPY;  Surgeon: Shirley Friar, MD;  Location: [facility] ENDOSCOPY;  Service: Endoscopy;  Laterality: N/A;    There were no vitals filed for this visit.  Visit Diagnosis:  Weakness  Bilateral low back pain with sciatica, sciatica laterality unspecified  Difficulty walking      Subjective Assessment - 08/26/15 1356    Subjective Patient reports increased soreness along upper back; She reports being off today and was "laying in bed too long" which irritated it. She reports compliance with HEP;    Pertinent History personal factors affecting rehab: chronic pain, job demands,  frequent sleep disturbances, arthritis (progresssive condition)   Limitations Lifting;Standing;Sitting   How long can you sit comfortably? 10 min   How long can you stand comfortably? stands for work and has to take pain pills to tolerate working;    How long can you walk comfortably? She reports needing something to hold onto when walking; she is not using any assistive device at this time but occasionally will use a stick when walking outside;    Diagnostic tests Has Had X-rays which showed decreasd disc height but otherwise no abnormality;    Patient Stated Goals "Get better"; Be able to walk outside; increase activity within the home; reduce back pain;    Currently in Pain? Yes   Pain Score 4    Pain Location Back   Pain Orientation Mid   Pain Descriptors / Indicators Aching;Sore   Pain Type Chronic pain   Pain Onset More than a month ago        TREATMENT: Warm up on UBE level 2 BUE backwards only x3 min (Unbilled);   PT instructed patient in scapular retraction exercise: HOIST low row, plate #2, [id number] with mod Vcs to increase scapular retraction with UE movement; HOIST lat pull down plate #2, V69 with min Vcs to increase stretch at end range for improved tissue extensibility;  Patient prone: BUE shoulder extensoin 2# x20; BUE shoulder horizontal abduction 2# x15; BUE mid row 2# x15 bilaterally; BUE low row 2# x15 bilaterally;  Prone on elbows, scapular protraction 2x15 with min VCs to increase protraction for better scapular control;   Patient required min-moderate verbal/tactile cues for correct exercise technique including cues to increase scapular retraction;   Finished with Moist heat concurrent with interferential TENs to upper back x15 min at tolerated intensity; Patient reports no pain after treatment session;                                PT Education - 08/26/15 1406    Education provided Yes   Education Details UE strengthening,  scapular retraction/protraction   Person(s) Educated Patient   Methods Explanation;Verbal cues   Comprehension Verbalized understanding;Returned demonstration;Verbal cues required             PT Long Term Goals - 08/19/15 1350    PT LONG TERM GOAL #1   Title Patient will be independent in home exercise program to improve strength/mobility for better functional independence with ADLs. by 09/09/15   Time 6   Period Weeks   Status On-going   PT LONG TERM GOAL #2   Title Patient (< 86 years old) will complete five times sit to stand test in < 10 seconds indicating an increased LE strength and improved balance. by 09/09/15   Time 6   Period Weeks   Status Partially Met   PT LONG TERM GOAL #3   Title Patient will increase BLE gross strength to 4+/5 as to improve functional strength for independent gait, increased standing tolerance and increased ADL ability. by 09/09/15   Time 6   Period Weeks   Status Partially Met   PT LONG TERM GOAL #4   Title Patient will report a worst pain of 5/10 on VAS in    low back         to improve tolerance with ADLs and reduced symptoms with activities. by 09/09/15   Time 6   Period Weeks   Status Partially Met   PT LONG TERM GOAL #5   Title  Patient will reduce modified Oswestry score to <20 as to demonstrate minimal disability with ADLs including improved sleeping tolerance, walking/sitting tolerance etc for better mobility with ADLs. by 09/09/15   Time 6   Period Weeks   Status Partially Met   PT LONG TERM GOAL #6   Title Patient will verbalize and demonstrate at least 3 correct body mechanics with  various tasks such as house cleaning/lifting/work tasks to reduce risk of injury to low back with ADLs by 09/09/15   Time 6   Period Weeks   Status Not Met               Plan - 08/26/15 1407    Clinical Impression Statement Instructed patient in postural strengthening exercise with focus on scapular retraction for better upper back posture; Patient  continues to require min VCs for correct positioning for better strengthening. She would benefit from additional skilled PT Intervention to improve upper back strength and reduce back pain;    Pt will benefit from skilled therapeutic intervention in order to improve on the following deficits Decreased endurance;Hypomobility;Decreased activity tolerance;Decreased strength;Pain;Difficulty walking;Decreased mobility;Decreased balance;Decreased range of motion;Improper body mechanics;Postural dysfunction;Impaired flexibility   Rehab Potential Fair   Clinical Impairments Affecting Rehab Potential positive: motivated; negative: chronic pain, weakness; Patient's clinical presentation is unstable as she has severe back pain with BLE radiculopathy; her pain varies in severity with different tasks and she exhibits decreased  tolerance with most mobility;    PT Frequency 2x / week   PT Duration 3 weeks   PT Treatment/Interventions Aquatic Therapy;Cryotherapy;Electrical Stimulation;Moist Heat;Traction;Balance training;Therapeutic exercise;Therapeutic activities;Functional mobility training;Stair training;Gait training;DME Instruction;Ultrasound;Neuromuscular re-education;Patient/family education;Manual techniques;Energy conservation;Dry needling;Passive range of motion   PT Next Visit Plan postural strengthening, lumbar extension; review lifting techniques for carryover   PT Home Exercise Plan advanced with weight strengthening;    Consulted and Agree with Plan of Care Patient        Problem List Patient Active Problem List   Diagnosis Date Noted  . Colitis 07/16/2013  . Acute renal failure ([place]) 07/16/2013  . HTN (hypertension) 07/16/2013  . Acute diverticulitis 07/16/2013  . Protein-calorie malnutrition, severe ([place]) 01/12/2013  . GIB (gastrointestinal bleeding) 01/07/2012  . Leukocytosis 01/07/2012  . Alcohol abuse 01/07/2012    Trotter,Margaret 08/26/2015, 2:22 PM  [place] MAIN [facility] SERVICES [address] [place], [place], [address] Phone: [phone]   Fax:  [phone]  Name: [name] MRN: [id number] Date of Birth: 12-06-1960

## 2015-08-28 ENCOUNTER — Ambulatory Visit: Payer: BLUE CROSS/BLUE SHIELD | Admitting: Physical Therapy

## 2015-09-02 ENCOUNTER — Encounter: Payer: BLUE CROSS/BLUE SHIELD | Admitting: Physical Therapy

## 2015-09-04 ENCOUNTER — Encounter: Payer: Self-pay | Admitting: Physical Therapy

## 2015-09-04 ENCOUNTER — Ambulatory Visit: Payer: BLUE CROSS/BLUE SHIELD | Admitting: Physical Therapy

## 2015-09-04 DIAGNOSIS — M5441 Lumbago with sciatica, right side: Secondary | ICD-10-CM | POA: Diagnosis not present

## 2015-09-04 DIAGNOSIS — R262 Difficulty in walking, not elsewhere classified: Secondary | ICD-10-CM

## 2015-09-04 DIAGNOSIS — M5442 Lumbago with sciatica, left side: Secondary | ICD-10-CM

## 2015-09-04 DIAGNOSIS — R531 Weakness: Secondary | ICD-10-CM

## 2015-09-04 NOTE — Therapy (Signed)
[place] MAIN [facility] SERVICES [address] [place], [place], [address] Phone: [phone]   Fax:  [phone]  Physical Therapy Treatment  Patient Details  Name: [name] MRN: [id number] Date of Birth: April 08, 1961 Referring Provider: Jerolyn Center NP  Encounter Date: 09/04/2015      PT End of Session - 09/04/15 1418    Visit Number 9   Number of Visits 17   Date for PT Re-Evaluation 09/09/15   PT Start Time 4196   PT Stop Time 1430   PT Time Calculation (min) 45 min   Equipment Utilized During Treatment Gait belt   Activity Tolerance Patient tolerated treatment well;No increased pain   Behavior During Therapy [facility] for tasks assessed/performed      Past Medical History  Diagnosis Date  . Hypothyroidism   . Chronic kidney disease     stones  . GERD (gastroesophageal reflux disease)   . Diverticulitis   . Hypertension     somewhat controlled, taking medication  . Arthritis     hands, back, knees    Past Surgical History  Procedure Laterality Date  . Abdominal hysterectomy    . Cholecystectomy    . Appendectomy    . Esophagogastroduodenoscopy  01/07/2012    Procedure: ESOPHAGOGASTRODUODENOSCOPY (EGD);  Surgeon: Winfield Cunas., MD;  [facility] ENDOSCOPY;  Service: Endoscopy;  Laterality: N/A;  . Colonoscopy  01/09/2012    Procedure: COLONOSCOPY;  Surgeon: Lear Ng, MD;  Location: [facility] ENDOSCOPY;  Service: Endoscopy;  Laterality: N/A;    There were no vitals filed for this visit.  Visit Diagnosis:  Weakness  Bilateral low back pain with sciatica, sciatica laterality unspecified  Difficulty walking      Subjective Assessment - 09/04/15 1352    Subjective Patient reports having increased back soreness yesterday due to trying sit ups. She reports trying to exercise more, doing a lot of walking (approximately 1 mile). She also reports signing the paperwork for home TENs unit. She reports compliance with  HEP;    Pertinent History personal factors affecting rehab: chronic pain, job demands, frequent sleep disturbances, arthritis (progresssive condition)   Limitations Lifting;Standing;Sitting   How long can you sit comfortably? 10 min   How long can you stand comfortably? stands for work and has to take pain pills to tolerate working;    How long can you walk comfortably? She reports needing something to hold onto when walking; she is not using any assistive device at this time but occasionally will use a stick when walking outside;    Diagnostic tests Has Had X-rays which showed decreasd disc height but otherwise no abnormality;    Patient Stated Goals "Get better"; Be able to walk outside; increase activity within the home; reduce back pain;    Currently in Pain? Yes   Pain Score 2    Pain Location Back   Pain Orientation Mid;Upper   Pain Descriptors / Indicators Aching;Sore   Pain Type Chronic pain   Pain Onset More than a month ago         TREATMENT: Warm up on UBE level 2 BUE backwards only x3 min (Unbilled);   PT instructed patient in scapular retraction exercise: HOIST low row, plate #2, 2I29 with mod Vcs to increase scapular retraction with UE movement; HOIST lat pull down plate #2, N98 with min Vcs to increase stretch at end range for improved tissue extensibility;  Patient prone: BUE shoulder extensoin 2# x15; BUE shoulder horizontal  abduction 2# x15; BUE mid row 2# x15 bilaterally; BUE overhead lift  2# x10 bilaterally; Alternate UE/LE Lift no weight x10 bilaterally;   Patient required min-moderate verbal/tactile cues for correct exercise technique including cues to increase scapular retraction;   Finished with cryotherapy concurrent with interferential TENs to upper back x15 min at tolerated intensity; Patient reports no pain after treatment session; Educated patient on safe TENs use including electrode placement and correct mode for pain relief.                              PT Education - 09/04/15 1418    Education provided Yes   Education Details UE strengthening, postural strengthening;    Person(s) Educated Patient   Methods Explanation;Verbal cues   Comprehension Verbalized understanding;Returned demonstration;Verbal cues required             PT Long Term Goals - 08/19/15 1350    PT LONG TERM GOAL #1   Title Patient will be independent in home exercise program to improve strength/mobility for better functional independence with ADLs. by 09/09/15   Time 6   Period Weeks   Status On-going   PT LONG TERM GOAL #2   Title Patient (55 years old) will complete five times sit to stand test in < 10 seconds indicating an increased LE strength and improved balance. by 09/09/15   Time 6   Period Weeks   Status Partially Met   PT LONG TERM GOAL #3   Title Patient will increase BLE gross strength to 4+/5 as to improve functional strength for independent gait, increased standing tolerance and increased ADL ability. by 09/09/15   Time 6   Period Weeks   Status Partially Met   PT LONG TERM GOAL #4   Title Patient will report a worst pain of 5/10 on VAS in    low back         to improve tolerance with ADLs and reduced symptoms with activities. by 09/09/15   Time 6   Period Weeks   Status Partially Met   PT LONG TERM GOAL #5   Title  Patient will reduce modified Oswestry score to <20 as to demonstrate minimal disability with ADLs including improved sleeping tolerance, walking/sitting tolerance etc for better mobility with ADLs. by 09/09/15   Time 6   Period Weeks   Status Partially Met   PT LONG TERM GOAL #6   Title Patient will verbalize and demonstrate at least 3 correct body mechanics with  various tasks such as house cleaning/lifting/work tasks to reduce risk of injury to low back with ADLs by 09/09/15   Time 6   Period Weeks   Status Not Met               Plan - 09/04/15 1418    Clinical  Impression Statement Advanced postural strengthening with increased repetition. Patient tolerated advanced exercises well without increase in pain. PT educated patient on safe TENs unit use with cues for electrode placement and mode of use. Patient verbalized and demonstrated understanding. Patient would benefit from additional skilled PT Intervention to improve postural strength and reduce back pain;    Pt will benefit from skilled therapeutic intervention in order to improve on the following deficits Decreased endurance;Hypomobility;Decreased activity tolerance;Decreased strength;Pain;Difficulty walking;Decreased mobility;Decreased balance;Decreased range of motion;Improper body mechanics;Postural dysfunction;Impaired flexibility   Rehab Potential Fair   Clinical Impairments Affecting Rehab Potential positive: motivated; negative: chronic pain,  weakness; Patient's clinical presentation is unstable as she has severe back pain with BLE radiculopathy; her pain varies in severity with different tasks and she exhibits decreased tolerance with most mobility;    PT Frequency 2x / week   PT Duration 3 weeks   PT Treatment/Interventions Aquatic Therapy;Cryotherapy;Electrical Stimulation;Moist Heat;Traction;Balance training;Therapeutic exercise;Therapeutic activities;Functional mobility training;Stair training;Gait training;DME Instruction;Ultrasound;Neuromuscular re-education;Patient/family education;Manual techniques;Energy conservation;Dry needling;Passive range of motion   PT Next Visit Plan postural strengthening, lumbar extension; review lifting techniques for carryover   PT Home Exercise Plan advanced with weight strengthening;    Consulted and Agree with Plan of Care Patient        Problem List Patient Active Problem List   Diagnosis Date Noted  . Colitis 07/16/2013  . Acute renal failure ([place]) 07/16/2013  . HTN (hypertension) 07/16/2013  . Acute diverticulitis 07/16/2013  . Protein-calorie  malnutrition, severe ([place]) 01/12/2013  . GIB (gastrointestinal bleeding) 01/07/2012  . Leukocytosis 01/07/2012  . Alcohol abuse 01/07/2012    Trotter,Margaret PT, DPT 09/04/2015, 2:21 PM  [place] MAIN [facility] SERVICES [address] [place], [place], [address] Phone: [phone]   Fax:  [phone]  Name: BRESHA HOSACK MRN: [id number] Date of Birth: 1960/06/10

## 2015-09-09 ENCOUNTER — Ambulatory Visit: Payer: BLUE CROSS/BLUE SHIELD | Attending: Nurse Practitioner | Admitting: Physical Therapy

## 2015-09-09 DIAGNOSIS — M5441 Lumbago with sciatica, right side: Secondary | ICD-10-CM | POA: Insufficient documentation

## 2015-09-09 DIAGNOSIS — R262 Difficulty in walking, not elsewhere classified: Secondary | ICD-10-CM | POA: Insufficient documentation

## 2015-09-09 DIAGNOSIS — M5442 Lumbago with sciatica, left side: Secondary | ICD-10-CM | POA: Insufficient documentation

## 2015-09-09 DIAGNOSIS — M6281 Muscle weakness (generalized): Secondary | ICD-10-CM | POA: Insufficient documentation

## 2015-09-11 ENCOUNTER — Ambulatory Visit: Payer: BLUE CROSS/BLUE SHIELD | Admitting: Physical Therapy

## 2015-09-11 ENCOUNTER — Encounter: Payer: Self-pay | Admitting: Physical Therapy

## 2015-09-11 DIAGNOSIS — M5442 Lumbago with sciatica, left side: Principal | ICD-10-CM

## 2015-09-11 DIAGNOSIS — M6281 Muscle weakness (generalized): Secondary | ICD-10-CM | POA: Diagnosis not present

## 2015-09-11 DIAGNOSIS — M5441 Lumbago with sciatica, right side: Secondary | ICD-10-CM

## 2015-09-11 DIAGNOSIS — R262 Difficulty in walking, not elsewhere classified: Secondary | ICD-10-CM | POA: Diagnosis present

## 2015-09-11 NOTE — Therapy (Signed)
[place] [facility] MAIN [facility] SERVICES [address] [place], [place], [address] Phone: [phone]   Fax:  [phone]  Physical Therapy Treatment  Patient Details  Name: [name] MRN: [id number] Date of Birth: July 04, 1960 Referring Provider: Rockney Ghee NP  Encounter Date: 09/11/2015      PT End of Session - 09/11/15 1721    Visit Number 10   Number of Visits 17   Date for PT Re-Evaluation 10/02/15   PT Start Time 1515   PT Stop Time 1612   PT Time Calculation (min) 57 min   Activity Tolerance Patient tolerated treatment well;No increased pain   Behavior During Therapy [facility] for tasks assessed/performed      Past Medical History  Diagnosis Date  . Hypothyroidism   . Chronic kidney disease     stones  . GERD (gastroesophageal reflux disease)   . Diverticulitis   . Hypertension     somewhat controlled, taking medication  . Arthritis     hands, back, knees    Past Surgical History  Procedure Laterality Date  . Abdominal hysterectomy    . Cholecystectomy    . Appendectomy    . Esophagogastroduodenoscopy  01/07/2012    Procedure: ESOPHAGOGASTRODUODENOSCOPY (EGD);  Surgeon: Vertell Novak., MD;  Location: [facility] ENDOSCOPY;  Service: Endoscopy;  Laterality: N/A;  . Colonoscopy  01/09/2012    Procedure: COLONOSCOPY;  Surgeon: Shirley Friar, MD;  Location: [facility] ENDOSCOPY;  Service: Endoscopy;  Laterality: N/A;    There were no vitals filed for this visit.  Visit Diagnosis:  Bilateral low back pain with sciatica, sciatica laterality unspecified - Plan: PT plan of care cert/re-cert  Muscle weakness (generalized) - Plan: PT plan of care cert/re-cert  Difficulty in walking, not elsewhere classified - Plan: PT plan of care cert/re-cert      Subjective Assessment - 09/11/15 1714    Subjective Patient reports increased back pain on Tuesday this week which limited her ability to come to her session; She did recieve her home TENs unit  but does not know how to use it.    Pertinent History personal factors affecting rehab: chronic pain, job demands, frequent sleep disturbances, arthritis (progresssive condition)   Limitations Lifting;Standing;Sitting   How long can you sit comfortably? 10 min   How long can you stand comfortably? stands for work and has to take pain pills to tolerate working;    How long can you walk comfortably? She reports needing something to hold onto when walking; she is not using any assistive device at this time but occasionally will use a stick when walking outside;    Diagnostic tests Has Had X-rays which showed decreasd disc height but otherwise no abnormality;    Patient Stated Goals "Get better"; Be able to walk outside; increase activity within the home; reduce back pain;    Currently in Pain? Yes   Pain Score 6    Pain Location Back   Pain Orientation Lower   Pain Descriptors / Indicators Aching;Sore   Pain Type Chronic pain   Pain Onset More than a month ago         TREATMENT: Warm up on elliptical with cues to increase erect posture x2 min (unbilled);  HOIST mid row, plate #2, 2Z36 with cues to increase scapular retraction for better upper back strengthening;  Hooklying: Pball lumbar trunk rotation x1 min with cues to avoid painful ROM; Pball bridges with arms by side x10; Pball double knee to chest  10 sec hold x 5 Patient reports significantly less back pain with lumbar stretches;  Educated patient in standing lumbopelvic anterior/posterior pelvic tilts against wall for better stretch x10 reps; Seated anterior/posterior pelvic rocks x10 with cues for increased core stabilization;  Patient required min-moderate verbal/tactile cues for correct exercise technique and to increase core abdominal stabilization with pelvic movement;  Educated patient in safe TENs use with cues for pad placement, correct TENs program use; Patient able to demonstrate understanding, turning TENs unit on,  selecting program and being able to adjust settings;                           PT Education - 09/11/15 1720    Education provided Yes   Education Details lumbar ROM, postural strengthening and safe TENs use   Person(s) Educated Patient   Methods Explanation;Verbal cues   Comprehension Verbalized understanding;Returned demonstration;Verbal cues required             PT Long Term Goals - 09/11/15 1722    PT LONG TERM GOAL #1   Title Patient will be independent in home exercise program to improve strength/mobility for better functional independence with ADLs. by 10/02/15   Time 6   Period Weeks   Status On-going   PT LONG TERM GOAL #2   Title Patient (< 62 years old) will complete five times sit to stand test in < 10 seconds indicating an increased LE strength and improved balance. by 10/02/15   Time 6   Period Weeks   Status Partially Met   PT LONG TERM GOAL #3   Title Patient will increase BLE gross strength to 4+/5 as to improve functional strength for independent gait, increased standing tolerance and increased ADL ability. by 10/02/15   Time 6   Period Weeks   Status Partially Met   PT LONG TERM GOAL #4   Title Patient will report a worst pain of 5/10 on VAS in    low back         to improve tolerance with ADLs and reduced symptoms with activities. by 10/02/15   Time 6   Period Weeks   Status Partially Met   PT LONG TERM GOAL #5   Title  Patient will reduce modified Oswestry score to <20 as to demonstrate minimal disability with ADLs including improved sleeping tolerance, walking/sitting tolerance etc for better mobility with ADLs. by 10/02/15   Time 6   Period Weeks   Status Partially Met   PT LONG TERM GOAL #6   Title Patient will verbalize and demonstrate at least 3 correct body mechanics with  various tasks such as house cleaning/lifting/work tasks to reduce risk of injury to low back with ADLs by 10/02/15   Time 6   Period Weeks   Status Not  Met               Plan - 09/11/15 1721    Clinical Impression Statement Instructed patient in postural strengthening, lumbar ROM/strengthening to reduce back pain. She was able to demonstrate good mobility with less back discomfort. PT also educated patient in safe TENs use with safe application and wear time. She would benefit from additional skilled PT intervention to improve lumbar ROM, reduce back pain and return to PLOF.    Pt will benefit from skilled therapeutic intervention in order to improve on the following deficits Decreased endurance;Hypomobility;Decreased activity tolerance;Decreased strength;Pain;Difficulty walking;Decreased mobility;Decreased balance;Decreased range of motion;Improper body mechanics;Postural dysfunction;Impaired flexibility  Rehab Potential Fair   Clinical Impairments Affecting Rehab Potential positive: motivated; negative: chronic pain, weakness; Patient's clinical presentation is unstable as she has severe back pain with BLE radiculopathy; her pain varies in severity with different tasks and she exhibits decreased tolerance with most mobility;    PT Frequency 2x / week   PT Duration 3 weeks   PT Treatment/Interventions Aquatic Therapy;Cryotherapy;Electrical Stimulation;Moist Heat;Traction;Balance training;Therapeutic exercise;Therapeutic activities;Functional mobility training;Stair training;Gait training;DME Instruction;Ultrasound;Neuromuscular re-education;Patient/family education;Manual techniques;Energy conservation;Dry needling;Passive range of motion   PT Next Visit Plan postural strengthening, lumbar extension; review lifting techniques for carryover   PT Home Exercise Plan advanced with weight strengthening;    Consulted and Agree with Plan of Care Patient        Problem List Patient Active Problem List   Diagnosis Date Noted  . Colitis 07/16/2013  . Acute renal failure (HCC) 07/16/2013  . HTN (hypertension) 07/16/2013  . Acute  diverticulitis 07/16/2013  . Protein-calorie malnutrition, severe (HCC) 01/12/2013  . GIB (gastrointestinal bleeding) 01/07/2012  . Leukocytosis 01/07/2012  . Alcohol abuse 01/07/2012    Princella Jaskiewicz PT, DPT 09/11/2015, 5:25 PM  [place] [facility] MAIN [facility] SERVICES [address] [place], [place], [address] Phone: [phone]   Fax:  [phone]  Name: [name] MRN: [id number] Date of Birth: 26-Nov-1960

## 2015-09-15 ENCOUNTER — Ambulatory Visit: Payer: BLUE CROSS/BLUE SHIELD | Admitting: Physical Therapy

## 2015-09-17 ENCOUNTER — Encounter: Payer: Self-pay | Admitting: Physical Therapy

## 2015-09-17 ENCOUNTER — Ambulatory Visit: Payer: BLUE CROSS/BLUE SHIELD | Admitting: Physical Therapy

## 2015-09-17 DIAGNOSIS — M6281 Muscle weakness (generalized): Secondary | ICD-10-CM | POA: Diagnosis not present

## 2015-09-17 DIAGNOSIS — M5441 Lumbago with sciatica, right side: Secondary | ICD-10-CM

## 2015-09-17 DIAGNOSIS — R262 Difficulty in walking, not elsewhere classified: Secondary | ICD-10-CM

## 2015-09-17 DIAGNOSIS — M5442 Lumbago with sciatica, left side: Secondary | ICD-10-CM

## 2015-09-17 NOTE — Therapy (Signed)
[place] [facility] MAIN [facility] SERVICES [address] [place], [place], [address] Phone: [phone]   Fax:  [phone]  Physical Therapy Treatment/Discharge Summary  Patient Details  Name: [name] MRN: [id number] Date of Birth: [birth year] Referring Provider: Rockney Ghee NP  Encounter Date: 09/17/2015      PT End of Session - 09/17/15 1428    Visit Number 11   Number of Visits 17   Date for PT Re-Evaluation 10/02/15   PT Start Time 1345   PT Stop Time 1428   PT Time Calculation (min) 43 min   Activity Tolerance Patient tolerated treatment well;No increased pain   Behavior During Therapy [facility] for tasks assessed/performed      Past Medical History  Diagnosis Date  . Hypothyroidism   . Chronic kidney disease     stones  . GERD (gastroesophageal reflux disease)   . Diverticulitis   . Hypertension     somewhat controlled, taking medication  . Arthritis     hands, back, knees    Past Surgical History  Procedure Laterality Date  . Abdominal hysterectomy    . Cholecystectomy    . Appendectomy    . Esophagogastroduodenoscopy  01/07/2012    Procedure: ESOPHAGOGASTRODUODENOSCOPY (EGD);  Surgeon: Vertell Novak., MD;  Location: [facility] ENDOSCOPY;  Service: Endoscopy;  Laterality: N/A;  . Colonoscopy  01/09/2012    Procedure: COLONOSCOPY;  Surgeon: Shirley Friar, MD;  Location: [facility] ENDOSCOPY;  Service: Endoscopy;  Laterality: N/A;    There were no vitals filed for this visit.      Subjective Assessment - 09/17/15 1400    Subjective Patient reports trying to use TENs unit at home but having difficulty; She reports 5/10 low back pain but no pain down legs; She reports doing yard work at [employer] for 3 hours with increased fatigue but no significant pain;    Pertinent History personal factors affecting rehab: chronic pain, job demands, frequent sleep disturbances, arthritis (progresssive condition)   Limitations  Lifting;Standing;Sitting   How long can you sit comfortably? 10 min   How long can you stand comfortably? stands for work and has to take pain pills to tolerate working;    How long can you walk comfortably? She reports needing something to hold onto when walking; she is not using any assistive device at this time but occasionally will use a stick when walking outside;    Diagnostic tests Has Had X-rays which showed decreasd disc height but otherwise no abnormality;    Patient Stated Goals "Get better"; Be able to walk outside; increase activity within the home; reduce back pain;    Currently in Pain? Yes   Pain Score 5    Pain Location Back   Pain Orientation Lower   Pain Descriptors / Indicators Aching;Sore   Pain Type Chronic pain   Pain Onset More than a month ago            [facility] PT Assessment - 09/17/15 0001    Observation/Other Assessments   Modified Oswertry 22% (moderate disability, improved from initial eval on 07/17/15 which was 58%)   Strength   Right Hip Flexion 4/5   Right Hip ABduction 4+/5   Right Hip ADduction 4+/5   Left Hip Flexion 4/5   Left Hip ABduction 4+/5   Left Hip ADduction 4+/5   Right Knee Flexion 4+/5   Right Knee Extension 4+/5   Left Knee Flexion 4+/5   Left Knee Extension  4+/5   Right Ankle Dorsiflexion 4+/5   Right Ankle Plantar Flexion 4+/5   Left Ankle Dorsiflexion 4+/5   Left Ankle Plantar Flexion 4+/5   Standardized Balance Assessment   Five times sit to stand comments  12 sec without HHA (>10 sec indicates increased risk for falls; improved from initial eval on 07/17/15 which was 42 sec)      TREATMENT: Warm up on Cross trainer, x3 min (unbilled);  HOIST mid row, plate #2, 4U98 with cues to increase scapular retraction; patient reports increased discomfort in low back but no upper back pain;  PT educated patient in outcome measures to assess progress towards goals; see above;  Re-educated patient in safe TENs unit use with cues for  electrode placement for either upper back and low back pain, low back pain only and how to adjust TENs unit settings for optimum pain relief. Patient required cues and education on how to adjust just one side (one lead wire) at a time for optimum relief and different intensity for upper/lower back; Patient responded well to cues and was able to verbally discuss understanding utilizing teach back method;                        PT Education - 09/17/15 1428    Education provided Yes   Education Details posture, TENs unit use, progress towards goals;    Person(s) Educated Patient   Methods Explanation;Verbal cues   Comprehension Verbalized understanding;Verbal cues required;Returned demonstration             PT Long Term Goals - 09/17/15 1404    PT LONG TERM GOAL #1   Title Patient will be independent in home exercise program to improve strength/mobility for better functional independence with ADLs. by 10/02/15   Time 6   Period Weeks   Status Achieved   PT LONG TERM GOAL #2   Title Patient (< 63 years old) will complete five times sit to stand test in < 10 seconds indicating an increased LE strength and improved balance. by 10/02/15   Time 6   Period Weeks   Status Partially Met   PT LONG TERM GOAL #3   Title Patient will increase BLE gross strength to 4+/5 as to improve functional strength for independent gait, increased standing tolerance and increased ADL ability. by 10/02/15   Time 6   Period Weeks   Status Achieved   PT LONG TERM GOAL #4   Title Patient will report a worst pain of 5/10 on VAS in    low back         to improve tolerance with ADLs and reduced symptoms with activities. by 10/02/15   Time 6   Period Weeks   Status Partially Met   PT LONG TERM GOAL #5   Title  Patient will reduce modified Oswestry score to <20 as to demonstrate minimal disability with ADLs including improved sleeping tolerance, walking/sitting tolerance etc for better mobility  with ADLs. by 10/02/15   Time 6   Period Weeks   Status Partially Met   PT LONG TERM GOAL #6   Title Patient will verbalize and demonstrate at least 3 correct body mechanics with  various tasks such as house cleaning/lifting/work tasks to reduce risk of injury to low back with ADLs by 10/02/15   Time 6   Period Weeks   Status Achieved               Plan -  09/17/15 1429    Clinical Impression Statement Patient re-educated in safe TENs use. She required cues for correct electrode placement and safe setting for optimum pain relief. Patient is independent in HEP; She reports being able to do most work tasks and home tasks without severe pain. She has been able to manage pain with TENs unit and with exercise/stretches. Patient has met or made progress towards most goals. She is appropriate for discharge at this time;    Rehab Potential Fair   Clinical Impairments Affecting Rehab Potential positive: motivated; negative: chronic pain, weakness; Patient's clinical presentation is unstable as she has severe back pain with BLE radiculopathy; her pain varies in severity with different tasks and she exhibits decreased tolerance with most mobility;    PT Frequency 2x / week   PT Duration 3 weeks   PT Treatment/Interventions Aquatic Therapy;Cryotherapy;Electrical Stimulation;Moist Heat;Traction;Balance training;Therapeutic exercise;Therapeutic activities;Functional mobility training;Stair training;Gait training;DME Instruction;Ultrasound;Neuromuscular re-education;Patient/family education;Manual techniques;Energy conservation;Dry needling;Passive range of motion   PT Next Visit Plan DC from therapy at this time;    PT Home Exercise Plan re-educated/reinforced; continue as given;    Consulted and Agree with Plan of Care Patient      Patient will benefit from skilled therapeutic intervention in order to improve the following deficits and impairments:  Decreased endurance, Hypomobility, Decreased  activity tolerance, Decreased strength, Pain, Difficulty walking, Decreased mobility, Decreased balance, Decreased range of motion, Improper body mechanics, Postural dysfunction, Impaired flexibility  Visit Diagnosis: Muscle weakness (generalized)  Bilateral low back pain with sciatica, sciatica laterality unspecified  Difficulty in walking, not elsewhere classified     Problem List Patient Active Problem List   Diagnosis Date Noted  . Colitis 07/16/2013  . Acute renal failure (HCC) 07/16/2013  . HTN (hypertension) 07/16/2013  . Acute diverticulitis 07/16/2013  . Protein-calorie malnutrition, severe (HCC) 01/12/2013  . GIB (gastrointestinal bleeding) 01/07/2012  . Leukocytosis 01/07/2012  . Alcohol abuse 01/07/2012    Monteen Toops PT, DPT 09/17/2015, 2:33 PM   [place] [facility] MAIN [facility] SERVICES [address] [place], [place], [address] Phone: [phone]   Fax:  [phone]  Name: [name] MRN: [id number] Date of Birth: 05/18/1961

## 2015-11-27 ENCOUNTER — Other Ambulatory Visit: Payer: Self-pay | Admitting: Adult Health

## 2015-11-27 DIAGNOSIS — E2839 Other primary ovarian failure: Secondary | ICD-10-CM

## 2015-12-11 ENCOUNTER — Ambulatory Visit
Admission: RE | Admit: 2015-12-11 | Discharge: 2015-12-11 | Disposition: A | Discharge status: Home / Self Care | Payer: BLUE CROSS/BLUE SHIELD | Source: Ambulatory Visit | Attending: Adult Health | Admitting: Adult Health

## 2015-12-11 DIAGNOSIS — E2839 Other primary ovarian failure: Secondary | ICD-10-CM

## 2016-01-05 ENCOUNTER — Encounter: Payer: Self-pay | Admitting: Hematology & Oncology

## 2016-01-21 ENCOUNTER — Other Ambulatory Visit (HOSPITAL_BASED_OUTPATIENT_CLINIC_OR_DEPARTMENT_OTHER): Payer: BLUE CROSS/BLUE SHIELD

## 2016-01-21 ENCOUNTER — Ambulatory Visit (HOSPITAL_BASED_OUTPATIENT_CLINIC_OR_DEPARTMENT_OTHER): Payer: BLUE CROSS/BLUE SHIELD | Admitting: Hematology & Oncology

## 2016-01-21 ENCOUNTER — Ambulatory Visit: Payer: BLUE CROSS/BLUE SHIELD

## 2016-01-21 VITALS — BP 157/84 | HR 68 | Temp 97.7°F | Resp 20 | Wt 92.0 lb

## 2016-01-21 DIAGNOSIS — E43 Unspecified severe protein-calorie malnutrition: Secondary | ICD-10-CM

## 2016-01-21 DIAGNOSIS — R970 Elevated carcinoembryonic antigen [CEA]: Secondary | ICD-10-CM

## 2016-01-21 DIAGNOSIS — R634 Abnormal weight loss: Secondary | ICD-10-CM

## 2016-01-21 DIAGNOSIS — F1021 Alcohol dependence, in remission: Secondary | ICD-10-CM | POA: Diagnosis not present

## 2016-01-21 DIAGNOSIS — K921 Melena: Secondary | ICD-10-CM

## 2016-01-21 DIAGNOSIS — Z87891 Personal history of nicotine dependence: Secondary | ICD-10-CM | POA: Diagnosis not present

## 2016-01-21 DIAGNOSIS — D72829 Elevated white blood cell count, unspecified: Secondary | ICD-10-CM

## 2016-01-21 LAB — CBC WITH DIFFERENTIAL (CANCER CENTER ONLY)
BASO#: 0.1 10*3/uL (ref 0.0–0.2)
BASO%: 0.5 % (ref 0.0–2.0)
EOS%: 3.3 % (ref 0.0–7.0)
Eosinophils Absolute: 0.3 10*3/uL (ref 0.0–0.5)
HCT: 46 % (ref 34.8–46.6)
HGB: 16.7 g/dL — ABNORMAL HIGH (ref 11.6–15.9)
LYMPH#: 2.1 10*3/uL (ref 0.9–3.3)
LYMPH%: 21.9 % (ref 14.0–48.0)
MCH: 39.9 pg — ABNORMAL HIGH (ref 26.0–34.0)
MCHC: 36.3 g/dL — AB (ref 32.0–36.0)
MCV: 110 fL — ABNORMAL HIGH (ref 81–101)
MONO#: 0.9 10*3/uL (ref 0.1–0.9)
MONO%: 8.8 % (ref 0.0–13.0)
NEUT#: 6.3 10*3/uL (ref 1.5–6.5)
NEUT%: 65.5 % (ref 39.6–80.0)
Platelets: 224 10*3/uL (ref 145–400)
RBC: 4.19 10*6/uL (ref 3.70–5.32)
RDW: 12.4 % (ref 11.1–15.7)
WBC: 9.7 10*3/uL (ref 3.9–10.0)

## 2016-01-21 LAB — COMPREHENSIVE METABOLIC PANEL
ALBUMIN: 3.7 g/dL (ref 3.5–5.0)
ALK PHOS: 101 U/L (ref 40–150)
ALT: 17 U/L (ref 0–55)
ANION GAP: 12 meq/L — AB (ref 3–11)
AST: 25 U/L (ref 5–34)
BUN: 10.5 mg/dL (ref 7.0–26.0)
CALCIUM: 9.7 mg/dL (ref 8.4–10.4)
CO2: 25 mEq/L (ref 22–29)
Chloride: 104 mEq/L (ref 98–109)
Creatinine: 1.2 mg/dL — ABNORMAL HIGH (ref 0.6–1.1)
EGFR: 51 mL/min/{1.73_m2} — AB (ref >90)
GLUCOSE: 100 mg/dL (ref 70–140)
POTASSIUM: 4 meq/L (ref 3.5–5.1)
SODIUM: 141 meq/L (ref 136–145)
Total Bilirubin: 0.51 mg/dL (ref 0.20–1.20)
Total Protein: 6.9 g/dL (ref 6.4–8.3)

## 2016-01-21 LAB — CHCC SATELLITE - SMEAR

## 2016-01-21 LAB — LACTATE DEHYDROGENASE: LDH: 191 U/L (ref 125–245)

## 2016-01-21 LAB — TECHNOLOGIST REVIEW CHCC SATELLITE

## 2016-01-21 NOTE — Progress Notes (Signed)
Referral MD  Reason for Referral: Weight loss and minimally elevated CEA level   Chief Complaint  Patient presents with  . Other    Initial Visit  : I'm not sure why I am losing weight.  HPI: [name] is a very nice 55 year old white female. She has had problems with weight loss. Apparently, she used to weigh about 140 or 150 pounds. She said that she began to lose weight about 5 years ago.  She is seen Dr. Aris Lot. Dr. Aris Lot has been very thorough. For some reason, a CEA level was drawn. It was mildly elevated at 6.6. Of note, [name] is a smoker.  She had hepatitis studies that were negative. HIV was negative. TSH was normal. She had studies of her celiac disease these were negative. She's had past problems with GI bleeding. She's had multiple endoscopies which I think is shown diverticulosis.  She says she stopped smoking about 4 months ago. She probably has about a 50-pack-year history of tobacco use. She used to drink pretty heavily.  She had a CT scan of the abdomen and pelvis a year ago. This showed some diverticulosis. Everything else looked okay. 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 Her reproductive organs have also been removed.  She had a mammogram done back in January and this was normal.  There is been no rashes. She's had no bleeding or bruising outside of that with past GI bleeds. She's had no mouth sores. She's had no dysphagia or odynophagia.  She was kindly referred to the [facility] to see if we can find out if she had cancer.    Past Medical History:  Diagnosis Date  . Arthritis    hands, back, knees  . Chronic kidney disease    stones  . Diverticulitis   . GERD (gastroesophageal reflux disease)   . Hypertension    somewhat controlled, taking medication  . Hypothyroidism   :  Past Surgical History:  Procedure Laterality Date  . ABDOMINAL HYSTERECTOMY    . APPENDECTOMY    . CHOLECYSTECTOMY    . COLONOSCOPY  01/09/2012   Procedure: COLONOSCOPY;  Surgeon: Lear Ng, MD;  Location: [facility] ENDOSCOPY;  Service: Endoscopy;  Laterality: N/A;  . ESOPHAGOGASTRODUODENOSCOPY  01/07/2012   Procedure: ESOPHAGOGASTRODUODENOSCOPY (EGD);  Surgeon: Winfield Cunas., MD;  Location: [facility] ENDOSCOPY;  Service: Endoscopy;  Laterality: N/A;  :   Current Outpatient Prescriptions:  .  ALPRAZolam (XANAX) 0.25 MG tablet, Take 0.25 mg by mouth 3 (three) times daily as needed for anxiety., Disp: , Rfl:  .  atenolol (TENORMIN) 50 MG tablet, Take 50 mg by mouth daily., Disp: , Rfl:  .  Biotin 5000 MCG TABS, Take by mouth daily., Disp: , Rfl:  .  Cholecalciferol (VITAMIN D-3) 5000 UNITS TABS, Take 10,000 Units by mouth daily after breakfast., Disp: , Rfl:  .  Cyanocobalamin (B-12) 5000 MCG CAPS, Take by mouth daily., Disp: , Rfl:  .  hydrALAZINE (APRESOLINE) 10 MG tablet, Take 1 tablet (10 mg total) by mouth 3 (three) times daily., Disp: 90 tablet, Rfl: 0 .  HYDROcodone-acetaminophen (NORCO/VICODIN) 5-325 MG per tablet, Take 1 tablet by mouth every 4 (four) hours as needed for moderate pain., Disp: 15 tablet, Rfl: 0 .  levobunolol (BETAGAN) 0.25 % ophthalmic solution, 1 drop at bedtime., Disp: , Rfl:  .  levothyroxine (SYNTHROID, LEVOTHROID) 50 MCG tablet, Take 50 mcg by mouth daily before breakfast., Disp: , Rfl:  .  Probiotic Product (PROBIOTIC-10  PO), Take by mouth daily., Disp: , Rfl:  .  vitamin E 400 UNIT capsule, Take 800 Units by mouth daily., Disp: , Rfl: :  :  No Known Allergies:  No family history on file.:  Social History   Social History  . Marital status: Single    Spouse name: N/A  . Number of children: N/A  . Years of education: N/A   Occupational History  . Not on file.   Social History Main Topics  . Smoking status: Former Smoker    Packs/day: 0.25    Years: 20.00    Types: Cigarettes    Quit date: 07/07/2015  . Smokeless tobacco: Never Used  . Alcohol use 16.8 oz/week    28 Shots of liquor per  week     Comment: 01/10/13 - quit 6 to 7 months ago, prior heavy use  . Drug use: No  . Sexual activity: No   Other Topics Concern  . Not on file   Social History Narrative  . No narrative on file  :  Pertinent items are noted in HPI.  Exam: @[facility] @ [place] white female in no obvious distress. Vital signs show a temperature of 97.7. Pulse 60. Blood pressure 157/84. Weight is 92 pounds. Head and neck exam shows no ocular or oral lesions. There is no adenopathy in the neck. There is no glossitis or angular colitis. Thyroid is not palpable. Lungs are clear bilaterally. Cardiac exam regular rate and rhythm with no murmurs, rubs or bruits. Abdomen is soft. She has no fluid wave. She has decent bowel sounds. There is no guarding or rebound tenderness. Her liver edge might be palpable with inspiration. There is no spleen tip. Back exam shows no tenderness over the spine, ribs or hips. Extremities shows no clubbing, cyanosis or edema. Skin exam shows no rashes, ecchymoses or petechia. Neurological exam shows no focal neurological deficits.    Recent Labs  01/21/16 1355  WBC 9.7  HGB 16.7*  HCT 46.0  PLT 224    Recent Labs  01/21/16 1356  NA 141  K 4.0  CO2 25  GLUCOSE 100  BUN 10.5  CREATININE 1.2*  CALCIUM 9.7    Blood smear review:  None  Pathology: None     Assessment and Plan:  [name] is a very nice 55 year old postmenopausal white female. She has weight loss. This is unexplained. She has a minimally elevated CEA.  I cannot find anything on her physical exam that was just an underlying malignancy. Her liver might be a little enlarged.  I, for some reason, would think that it would be unusual for her to have malignancy. Another there is a family history. I liquor mother died at age 13 of cancer. She is not sure cancer her mother had.  I really do not want to go on a "cancer hunt.". The majority of patients who we see are already diagnosed with malignancy.  We  will get a CAT scan. I think this is be reasonable. She does have a history of heavy tobacco use. She was a heavy alcohol user. Thank you she stopped both.  I spent about 45 minutes with her. She is very nice. I feel bad for her. I'm not sure how much I can really help her out.  I don't see that we had to do any bone marrow test on her.  I will plan to get her back to see me in another 3 or 4 weeks. By then, we should really  know whether not there is any underlying malignancy.

## 2016-01-22 LAB — IRON AND TIBC
%SAT: 77 % — AB (ref 21–57)
IRON: 228 ug/dL — AB (ref 41–142)
TIBC: 298 ug/dL (ref 236–444)
UIBC: 70 ug/dL — ABNORMAL LOW (ref 120–384)

## 2016-01-22 LAB — CEA (IN HOUSE-CHCC): CEA (CHCC-In House): 13.87 ng/mL — ABNORMAL HIGH (ref 0.00–5.00)

## 2016-01-22 LAB — TSH: TSH: 2.267 m[IU]/L (ref 0.308–3.960)

## 2016-01-22 LAB — FERRITIN: FERRITIN: 140 ng/mL (ref 9–269)

## 2016-01-27 ENCOUNTER — Encounter (HOSPITAL_BASED_OUTPATIENT_CLINIC_OR_DEPARTMENT_OTHER): Payer: Self-pay

## 2016-01-27 ENCOUNTER — Ambulatory Visit (HOSPITAL_BASED_OUTPATIENT_CLINIC_OR_DEPARTMENT_OTHER)
Admission: RE | Admit: 2016-01-27 | Discharge: 2016-01-27 | Disposition: A | Discharge status: Home / Self Care | Payer: BLUE CROSS/BLUE SHIELD | Source: Ambulatory Visit | Attending: Hematology & Oncology | Admitting: Hematology & Oncology

## 2016-01-27 DIAGNOSIS — K921 Melena: Secondary | ICD-10-CM

## 2016-01-27 DIAGNOSIS — I700 Atherosclerosis of aorta: Secondary | ICD-10-CM | POA: Insufficient documentation

## 2016-01-27 DIAGNOSIS — R918 Other nonspecific abnormal finding of lung field: Secondary | ICD-10-CM | POA: Diagnosis not present

## 2016-01-27 DIAGNOSIS — I2510 Atherosclerotic heart disease of native coronary artery without angina pectoris: Secondary | ICD-10-CM | POA: Insufficient documentation

## 2016-01-27 DIAGNOSIS — N200 Calculus of kidney: Secondary | ICD-10-CM | POA: Diagnosis not present

## 2016-01-27 MED ORDER — IOPAMIDOL (ISOVUE-300) INJECTION 61%
100.0000 mL | Freq: Once | INTRAVENOUS | Status: AC | PRN
Start: 2016-01-27 — End: 2016-01-27
  Administered 2016-01-27: 100 mL via INTRAVENOUS

## 2016-01-28 ENCOUNTER — Ambulatory Visit (HOSPITAL_BASED_OUTPATIENT_CLINIC_OR_DEPARTMENT_OTHER): Payer: BLUE CROSS/BLUE SHIELD

## 2016-01-28 ENCOUNTER — Telehealth: Payer: Self-pay | Admitting: Hematology & Oncology

## 2016-01-29 ENCOUNTER — Other Ambulatory Visit: Payer: Self-pay | Admitting: Hematology & Oncology

## 2016-01-29 DIAGNOSIS — R918 Other nonspecific abnormal finding of lung field: Secondary | ICD-10-CM

## 2016-01-29 NOTE — Telephone Encounter (Signed)
I left a message on her answering machine.

## 2016-02-06 ENCOUNTER — Encounter (HOSPITAL_COMMUNITY)
Admission: RE | Admit: 2016-02-06 | Discharge: 2016-02-06 | Disposition: A | Discharge status: Home / Self Care | Payer: BLUE CROSS/BLUE SHIELD | Source: Ambulatory Visit | Attending: Hematology & Oncology | Admitting: Hematology & Oncology

## 2016-02-06 DIAGNOSIS — R918 Other nonspecific abnormal finding of lung field: Secondary | ICD-10-CM | POA: Diagnosis not present

## 2016-02-06 LAB — GLUCOSE, CAPILLARY: GLUCOSE-CAPILLARY: 100 mg/dL — AB (ref 65–99)

## 2016-02-06 MED ORDER — FLUDEOXYGLUCOSE F - 18 (FDG) INJECTION: 5.0000 | Freq: Once | INTRAVENOUS | Status: DC | PRN

## 2016-02-11 ENCOUNTER — Ambulatory Visit: Payer: BLUE CROSS/BLUE SHIELD | Admitting: Hematology & Oncology

## 2016-02-11 ENCOUNTER — Other Ambulatory Visit: Payer: BLUE CROSS/BLUE SHIELD

## 2016-02-20 ENCOUNTER — Other Ambulatory Visit: Payer: BLUE CROSS/BLUE SHIELD

## 2016-02-20 ENCOUNTER — Ambulatory Visit (HOSPITAL_BASED_OUTPATIENT_CLINIC_OR_DEPARTMENT_OTHER): Payer: BLUE CROSS/BLUE SHIELD | Admitting: Hematology & Oncology

## 2016-02-20 ENCOUNTER — Encounter: Payer: Self-pay | Admitting: Hematology & Oncology

## 2016-02-20 VITALS — BP 146/82 | HR 56 | Temp 98.3°F | Resp 16 | Ht <= 58 in | Wt 93.0 lb

## 2016-02-20 DIAGNOSIS — R970 Elevated carcinoembryonic antigen [CEA]: Secondary | ICD-10-CM

## 2016-02-20 DIAGNOSIS — R918 Other nonspecific abnormal finding of lung field: Secondary | ICD-10-CM | POA: Diagnosis not present

## 2016-02-20 DIAGNOSIS — R634 Abnormal weight loss: Secondary | ICD-10-CM | POA: Diagnosis not present

## 2016-02-20 DIAGNOSIS — Z720 Tobacco use: Secondary | ICD-10-CM | POA: Diagnosis not present

## 2016-02-20 DIAGNOSIS — D72829 Elevated white blood cell count, unspecified: Secondary | ICD-10-CM

## 2016-02-20 NOTE — Progress Notes (Signed)
Hematology and Oncology Follow Up Visit  EMELIE HOUSEY NE:[id number] Jul 29, 1960 55 y.o. 02/20/2016   Principle Diagnosis:   Elevated CEA level and weight loss  Current Therapy:    Observation     Interim History:  Ms. Denbow is back for follow-up. We first saw her in mid August. At that point time, she was kindly referred because of weight loss. She had lost quite a bit of weight. She used to weigh about 150 pounds a couple years ago.  The real problem is that her CEA was elevated. Before we saw her, her CEA was 6.6. We saw her, it was up to 13.  She does smoke quite a bit. She has about a 50-pack-year history of tobacco use.  We did do some x-rays on her. We did CT scans on her. The CT scans multiple ill-defined pulmonary nodules. They were up to 8 mm. There is no evidence of adenopathy in the mediastinum. The abdomen and pelvis all looked fine.  We then got a PET scan on her. The PET scan showed some minimal activity in the lungs. There is no disease noted elsewhere.  Her appetite is doing a little better. She's had no nausea or vomiting. She's had no cough or shortness of breath area and she is still smoking but trying to cut back.  There's been no rashes. She's had no bleeding.  Overall, her performance status is ECOG 1.  Medications:  Current Outpatient Prescriptions:  .  ALPRAZolam (XANAX) 0.25 MG tablet, Take 0.25 mg by mouth 3 (three) times daily as needed for anxiety., Disp: , Rfl:  .  atenolol (TENORMIN) 50 MG tablet, Take 50 mg by mouth daily., Disp: , Rfl:  .  Biotin 5000 MCG TABS, Take by mouth daily., Disp: , Rfl:  .  Cholecalciferol (VITAMIN D-3) 5000 UNITS TABS, Take 10,000 Units by mouth daily after breakfast., Disp: , Rfl:  .  Cyanocobalamin (B-12) 5000 MCG CAPS, Take by mouth daily., Disp: , Rfl:  .  hydrALAZINE (APRESOLINE) 10 MG tablet, Take 1 tablet (10 mg total) by mouth 3 (three) times daily., Disp: 90 tablet, Rfl: 0 .  HYDROcodone-acetaminophen  (NORCO/VICODIN) 5-325 MG per tablet, Take 1 tablet by mouth every 4 (four) hours as needed for moderate pain., Disp: 15 tablet, Rfl: 0 .  levobunolol (BETAGAN) 0.25 % ophthalmic solution, 1 drop at bedtime., Disp: , Rfl:  .  levothyroxine (SYNTHROID, LEVOTHROID) 50 MCG tablet, Take 50 mcg by mouth daily before breakfast., Disp: , Rfl:  .  pantoprazole (PROTONIX) 20 MG tablet, , Disp: , Rfl: 4 .  Probiotic Product (PROBIOTIC-10 PO), Take by mouth daily., Disp: , Rfl:  .  vitamin E 400 UNIT capsule, Take 800 Units by mouth daily., Disp: , Rfl:   Allergies: No Known Allergies  Past Medical History, Surgical history, Social history, and Family History were reviewed and updated.  Review of Systems: As above  Physical Exam:  height is 4\' 10"  (1.473 m) and weight is 93 lb (42.2 kg). Her oral temperature is 98.3 F (36.8 C). Her blood pressure is 146/82 (abnormal) and her pulse is 56 (abnormal). Her respiration is 16.   Wt Readings from Last 3 Encounters:  02/20/16 93 lb (42.2 kg)  01/21/16 92 lb (41.7 kg)  07/20/13 113 lb 1.5 oz (51.3 kg)     Head and neck exam shows no ocular or oral lesions. There is no adenopathy in the neck. There is no glossitis or angular colitis. Thyroid is not palpable.  Lungs are clear bilaterally. Cardiac exam regular rate and rhythm with no murmurs, rubs or bruits. Abdomen is soft. She has no fluid wave. She has decent bowel sounds. There is no guarding or rebound tenderness. Her liver edge might be palpable with inspiration. There is no spleen tip. Back exam shows no tenderness over the spine, ribs or hips. Extremities shows no clubbing, cyanosis or edema. Skin exam shows no rashes, ecchymoses or petechia. Neurological exam shows no focal neurological deficits.   Lab Results  Component Value Date   WBC 9.7 01/21/2016   HGB 16.7 (H) 01/21/2016   HCT 46.0 01/21/2016   MCV 110 (H) 01/21/2016   PLT 224 01/21/2016     Chemistry      Component Value Date/Time   NA  141 01/21/2016 1356   K 4.0 01/21/2016 1356   CL 106 12/18/2014 1851   CL 103 06/09/2014 1933   CO2 25 01/21/2016 1356   BUN 10.5 01/21/2016 1356   CREATININE 1.2 (H) 01/21/2016 1356      Component Value Date/Time   CALCIUM 9.7 01/21/2016 1356   ALKPHOS 101 01/21/2016 1356   AST 25 01/21/2016 1356   ALT 17 01/21/2016 1356   BILITOT 0.51 01/21/2016 1356         Impression and Plan: Ms. Koscielski is A 55 year old white female. She's had weight loss. She has elevated CEA. She has these small bilateral lung nodules.  Unfortunately, I think that one option might have to be to do some type of biopsy. I spoke with Dr. Roxan Hockey of thoracic surgery. I totally understand his hesitation to do a biopsy. He's not sure if a biopsy would even be positive.  We talked about was doing a repeat CT scan in about 6 weeks or so. If there are any changes at that point, then a open lung biopsy would be reasonable.  I'm still not sure that she has malignancy. The elevated CEA is certainly troublesome.  Masher what else we could be looking at. Maybe she has some type of inflammatory reaction.  She has some type of collagen vascular disease.  She does look a little bit better than when I last saw her. I'm happy about that.  We will plan to get her back in about 6 weeks. I will do a scan the same day that I see her.  I spent about 30 minutes with her.   Volanda Napoleon, MD 9/15/20175:39 PM

## 2016-02-22 NOTE — Progress Notes (Signed)
I haven't seen this patient in years and I'm not currently involved in her care.  Thanks.  GSD

## 2016-02-25 ENCOUNTER — Encounter: Payer: BLUE CROSS/BLUE SHIELD | Admitting: Thoracic Surgery (Cardiothoracic Vascular Surgery)

## 2016-02-27 ENCOUNTER — Encounter: Payer: Self-pay | Admitting: Thoracic Surgery (Cardiothoracic Vascular Surgery)

## 2016-02-27 ENCOUNTER — Institutional Professional Consult (permissible substitution) (INDEPENDENT_AMBULATORY_CARE_PROVIDER_SITE_OTHER): Payer: BLUE CROSS/BLUE SHIELD | Admitting: Thoracic Surgery (Cardiothoracic Vascular Surgery)

## 2016-02-27 VITALS — BP 140/79 | HR 64 | Resp 18 | Ht <= 58 in | Wt 93.0 lb

## 2016-02-27 DIAGNOSIS — R918 Other nonspecific abnormal finding of lung field: Secondary | ICD-10-CM

## 2016-02-27 NOTE — Progress Notes (Signed)
PCP is Dr. Aris Lot Referring Provider is Marin Olp, Rudell Cobb, MD  Chief Complaint  Patient presents with  . Lung Lesion    surgical eval for biopsy of bilateral pulmonary nodues, PET Scan 02/06/16, Chest/ABD/Pelvis  CT 01/27/16    HPI: 55 year old woman sent for consultation regarding bilateral pulmonary nodules.  [name] is a 55 year old woman with a history of hypertension, hypothyroidism, reflux, arthritis, and tobacco abuse. She smoked 1-2 packs a day for about 30 years, but says she's currently down to 10 cigarettes a day. Over the past year she has had significant weight loss of about 30 pounds. She has a poor appetite and only eats about 1 meal a day. She's lost 5 pounds over the past 3 months.   During her evaluation for weight loss she had a CEA level done. It was 6.6. A repeat was 13. She saw Dr. Marin Olp who did a CT of the chest abdomen and pelvis. There were multiple scattered groundglass nodules in the lungs. There was no adenopathy in the chest. There were no significant findings in the abdomen and pelvis.  A PET/CT showed no significant hypermetabolic activity in the groundglass nodules.  She continues to smoke about one half pack of cigarettes daily. She denies cough, wheezing, chest pain, pressure or tightness. She denies headaches. She does have blurry vision.  Zubrod Score: At the time of surgery this patient's most appropriate activity status/level should be described as: []     0    Normal activity, no symptoms [x]     1    Restricted in physical strenuous activity but ambulatory, able to do out light work []     2    Ambulatory and capable of self care, unable to do work activities, up and about >50 % of waking hours                              []     3    Only limited self care, in bed greater than 50% of waking hours []     4    Completely disabled, no self care, confined to bed or chair []     5    Moribund    Past Medical History:  Diagnosis Date  . Arthritis    hands, back, knees  . Chronic kidney disease    stones  . Diverticulitis   . GERD (gastroesophageal reflux disease)   . Hypertension    somewhat controlled, taking medication  . Hypothyroidism     Past Surgical History:  Procedure Laterality Date  . ABDOMINAL HYSTERECTOMY    . APPENDECTOMY    . CHOLECYSTECTOMY    . COLONOSCOPY  01/09/2012   Procedure: COLONOSCOPY;  Surgeon: Lear Ng, MD;  Location[facility] ENDOSCOPY;  Service: Endoscopy;  Laterality: N/A;  . ESOPHAGOGASTRODUODENOSCOPY  01/07/2012   Procedure: ESOPHAGOGASTRODUODENOSCOPY (EGD);  Surgeon: Winfield Cunas., MD;  Location: [facility] ENDOSCOPY;  Service: Endoscopy;  Laterality: N/A;    No family history on file.  Social History Social History  Substance Use Topics  . Smoking status: Current Some Day Smoker    Packs/day: 0.25    Years: 20.00    Types: Cigarettes    Last attempt to quit: 07/07/2015  . Smokeless tobacco: Never Used  . Alcohol use 16.8 oz/week    28 Shots of liquor per week     Comment: 01/10/13 - quit 6 to 7 months ago, prior heavy use  Current Outpatient Prescriptions  Medication Sig Dispense Refill  . ALPRAZolam (XANAX) 0.25 MG tablet Take 0.25 mg by mouth 3 (three) times daily as needed for anxiety.    Marland Kitchen atenolol (TENORMIN) 50 MG tablet Take 50 mg by mouth daily.    . Biotin 5000 MCG TABS Take by mouth daily.    . Cholecalciferol (VITAMIN D-3) 5000 UNITS TABS Take 10,000 Units by mouth daily after breakfast.    . Cyanocobalamin (B-12) 5000 MCG CAPS Take by mouth daily.    . hydrALAZINE (APRESOLINE) 10 MG tablet Take 1 tablet (10 mg total) by mouth 3 (three) times daily. 90 tablet 0  . HYDROcodone-acetaminophen (NORCO/VICODIN) 5-325 MG per tablet Take 1 tablet by mouth every 4 (four) hours as needed for moderate pain. 15 tablet 0  . levothyroxine (SYNTHROID, LEVOTHROID) 50 MCG tablet Take 50 mcg by mouth daily before breakfast.    . pantoprazole (PROTONIX) 20 MG tablet   4  . Probiotic Product  (PROBIOTIC-10 PO) Take by mouth daily.    . vitamin E 400 UNIT capsule Take 800 Units by mouth daily.     No current facility-administered medications for this visit.     No Known Allergies  Review of Systems  Constitutional: Positive for appetite change and unexpected weight change. Negative for chills, fatigue and fever.  HENT: Negative for trouble swallowing and voice change.   Eyes: Negative for visual disturbance.  Respiratory: Negative for cough, shortness of breath and wheezing.   Cardiovascular: Negative for chest pain, palpitations and leg swelling.  Gastrointestinal: Negative for abdominal pain and blood in stool.  Genitourinary: Negative for difficulty urinating and dysuria.  Musculoskeletal: Positive for arthralgias and back pain.  Neurological: Negative for syncope, speech difficulty and weakness.  Hematological: Negative for adenopathy. Does not bruise/bleed easily.  All other systems reviewed and are negative.   BP 140/79 (BP Location: Right Arm, Patient Position: Sitting, Cuff Size: Small)   Pulse 64   Resp 18   Ht 4\' 10"  (1.473 m)   Wt 93 lb (42.2 kg)   SpO2 98% Comment: RA  BMI 19.44 kg/m  Physical Exam  Constitutional: She is oriented to person, place, and time. She appears well-developed. No distress.  HENT:  Head: Normocephalic and atraumatic.  Mouth/Throat: No oropharyngeal exudate.  Eyes: Conjunctivae and EOM are normal. No scleral icterus.  Neck: Neck supple. No thyromegaly present.  Cardiovascular: Normal rate, regular rhythm, normal heart sounds and intact distal pulses.   No murmur heard. Pulmonary/Chest: Effort normal and breath sounds normal. No respiratory distress. She has no wheezes. She has no rales.  Abdominal: Soft. She exhibits no distension. There is no tenderness.  Musculoskeletal: She exhibits no edema or tenderness.  Lymphadenopathy:    She has no cervical adenopathy.  Neurological: She is alert and oriented to person, place, and  time. No cranial nerve deficit. She exhibits normal muscle tone.  Vitals reviewed.    Diagnostic Tests: CT CHEST, ABDOMEN, AND PELVIS WITH CONTRAST  TECHNIQUE: Multidetector CT imaging of the chest, abdomen and pelvis was performed following the standard protocol during bolus administration of intravenous contrast.  CONTRAST:  164mL ISOVUE-300 IOPAMIDOL (ISOVUE-300) INJECTION 61%  COMPARISON:  CT chest from the 09/25/2011  FINDINGS: CT CHEST FINDINGS  Mediastinum/Lymph Nodes: No masses, pathologically enlarged lymph nodes, or other significant abnormality. Aortic atherosclerosis noted. Calcification within the LAD and RCA coronary artery noted.  Lungs/Pleura: Multiple ill defined pulmonary nodules are identified in both lungs. These are new when compared with the  previous exam. Index nodule within the anterior left upper lobe measures 8 mm, image 43 of series 3. Right upper lobe nodule measures 6 mm, image 44 of series 3. Within the left lower lobe there is a subpleural nodule measuring 4 mm, image number 82 of series 3.  Musculoskeletal: No chest wall mass or suspicious bone lesions identified.  CT ABDOMEN PELVIS FINDINGS  Hepatobiliary: Normal appearance of the liver. No focal liver abnormality. Previous cholecystectomy. No biliary dilatation.  Pancreas: No inflammation or mass identified.  Spleen: Within normal limits in size and appearance.  Adrenals/Urinary Tract: The adrenal glands are normal. Right kidney cyst noted. Unremarkable appearance of the left kidney. The urinary bladder appears normal.  Stomach/Bowel: The stomach appears normal. The small and large bowel loops are unremarkable. Numerous colonic diverticula noted. No evidence for acute inflammation.  Vascular/Lymphatic: Aortic atherosclerosis noted. No aneurysm. There is no retroperitoneal adenopathy identified.  Reproductive: Previous hysterectomy.  No adnexal mass.  Other:  There is no ascites or focal fluid collections within the abdomen or pelvis.  Musculoskeletal: No aggressive lytic or sclerotic bone lesion identified.  IMPRESSION: 1. Multifocal, ill defined pulmonary nodules are identified in both lungs and are indeterminate. These measure up to 8 mm. Findings may reflect an inflammatory or infectious process. Malignancy not excluded. Non-contrast chest CT at 6-12 months is recommended. If the nodule is stable at time of repeat CT, then future CT at 18-24 months (from today's scan) is considered optional for low-risk patients, but is recommended for high-risk patients. This recommendation follows the consensus statement: Guidelines for Management of Incidental Pulmonary Nodules Detected on CT Images:From the Fleischner Society 2017; published online before print (10.1148/radiol.SG:5268862). 2. No mass or adenopathy identified within the abdomen or pelvis. 3. Nonobstructing renal calculi. 4. Aortic atherosclerosis and multi vessel coronary artery calcification.   Electronically Signed   By: Kerby Moors M.D.   On: 01/27/2016 15:46 NUCLEAR MEDICINE PET SKULL BASE TO THIGH  TECHNIQUE: 5.0 mCi F-18 FDG was injected intravenously. Full-ring PET imaging was performed from the skull base to thigh after the radiotracer. CT data was obtained and used for attenuation correction and anatomic localization.  FASTING BLOOD GLUCOSE:  Value: 100 mg/dl  COMPARISON:  01/27/2016  FINDINGS: NECK  No hypermetabolic lymph nodes in the neck. Carotid artery atherosclerotic calcification noted.  CHEST  Scattered ground-glass density pulmonary nodules measuring up to 8 mm in diameter as on image 16 of the CT data. The 8 mm nodule in the apical posterior segment left upper lobe has a maximum standard uptake value of 1.2, background which is nonspecific. Other sub solid nodules similarly have a background activity at or below 1.2.  Coronary,  aortic arch, and branch vessel atherosclerotic vascular disease. Mildly ectatic ascending thoracic aorta at 3.6 cm diameter.  ABDOMEN/PELVIS  No abnormal hypermetabolic activity within the liver, pancreas, adrenal glands, or spleen. No hypermetabolic lymph nodes in the abdomen or pelvis. Atherosclerotic calcification of the abdominal aorta and splenic artery with atherosclerotic calcifications in the left renal hilum. There is some scarring in the left kidney and a fluid density lesion in the right mid kidney. Sigmoid colon diverticulosis with scattered diverticula elsewhere in the colon.  SKELETON  No focal hypermetabolic activity to suggest skeletal metastasis.  IMPRESSION: 1. The ground-glass pulmonary nodules measuring up to 8 mm in diameter are stable over the past 10 days in terms of size, and have a maximum standard uptake value up to 1.2. These could be inflammatory or neoplastic at  this level of metabolic activity. Please note that PET-CT often has limited utility and can be misleading in the assessment of sub-solid pulmonary nodules. Consider initial follow up noncontrast chest CT in 6 months time, as per current guideline recommendations in this situation. 2. Other imaging findings of potential clinical significance: Coronary, aortic arch, and branch vessel atherosclerotic vascular disease. Aortoiliac atherosclerotic vascular disease. Scarring in the left kidney. Right renal cyst. Carotid atherosclerotic calcification.   Electronically Signed   By: Van Clines M.D.   On: 02/06/2016 15:09 I personally reviewed the CT and PET CT images. She has multiple groundglass nodules bilaterally with no significant FDG uptake on PET, which is nonspecific.  Impression: 69 year old woman with history of tobacco abuse who has multiple groundglass opacities in the lungs. These are all less than a centimeter. There is no significant metabolic activity in these by PET CT,  although that finding is not surprising given their small size and certainly does not rule out the possibility of malignancy. This would be a very unusual appearance for malignancy. It does not appear to be metastatic disease. It could be multifocal adenocarcinoma in situ (bronchoalveolar cell carcinoma). However I would not expect that to cause a significant rise in CEA level. CEA can be elevated just from smoking, although her level over 10 is concerning.  She has multiple nodules on both sides with no dominant lesion. These nodules would be difficult to localize intraoperatively due to their small size. There is no guarantee that on biopsy would result in a definitive diagnosis.  These nodules also could represent an infectious or inflammatory (vasculitis etc.) process.  I long discussion with [name] and her sister. We reviewed the films. We discussed possible approaches including lung biopsy which would be diagnostic only and not therapeutic in any way. We also discussed radiographic observation.  I strongly recommended that she stop smoking altogether. She has cut down, but she needs to quit completely. She says she plans to do that by using patches.  After our discussion my recommendation was to repeat a CT in 6 months is recommended by radiology. She certainly is welcome to get a second opinion if she would like.  Plan: Return in 6 months with CT chest  Melrose Nakayama, MD [facility] [phone]

## 2016-04-08 ENCOUNTER — Other Ambulatory Visit (HOSPITAL_BASED_OUTPATIENT_CLINIC_OR_DEPARTMENT_OTHER): Payer: BLUE CROSS/BLUE SHIELD

## 2016-04-08 ENCOUNTER — Other Ambulatory Visit: Payer: Self-pay | Admitting: Family

## 2016-04-08 ENCOUNTER — Telehealth: Payer: Self-pay | Admitting: Emergency Medicine

## 2016-04-08 ENCOUNTER — Ambulatory Visit (HOSPITAL_BASED_OUTPATIENT_CLINIC_OR_DEPARTMENT_OTHER): Payer: BLUE CROSS/BLUE SHIELD | Admitting: Hematology & Oncology

## 2016-04-08 ENCOUNTER — Ambulatory Visit (HOSPITAL_BASED_OUTPATIENT_CLINIC_OR_DEPARTMENT_OTHER)
Admission: RE | Admit: 2016-04-08 | Discharge: 2016-04-08 | Disposition: A | Discharge status: Home / Self Care | Payer: BLUE CROSS/BLUE SHIELD | Source: Ambulatory Visit | Attending: Hematology & Oncology | Admitting: Hematology & Oncology

## 2016-04-08 VITALS — BP 171/74 | HR 67 | Temp 97.5°F | Resp 20 | Wt 92.0 lb

## 2016-04-08 DIAGNOSIS — R918 Other nonspecific abnormal finding of lung field: Secondary | ICD-10-CM

## 2016-04-08 DIAGNOSIS — J439 Emphysema, unspecified: Secondary | ICD-10-CM | POA: Insufficient documentation

## 2016-04-08 DIAGNOSIS — R970 Elevated carcinoembryonic antigen [CEA]: Secondary | ICD-10-CM | POA: Diagnosis not present

## 2016-04-08 DIAGNOSIS — Z87891 Personal history of nicotine dependence: Secondary | ICD-10-CM | POA: Diagnosis not present

## 2016-04-08 DIAGNOSIS — I2510 Atherosclerotic heart disease of native coronary artery without angina pectoris: Secondary | ICD-10-CM | POA: Insufficient documentation

## 2016-04-08 DIAGNOSIS — I700 Atherosclerosis of aorta: Secondary | ICD-10-CM | POA: Insufficient documentation

## 2016-04-08 DIAGNOSIS — D72829 Elevated white blood cell count, unspecified: Secondary | ICD-10-CM

## 2016-04-08 LAB — CBC WITH DIFFERENTIAL (CANCER CENTER ONLY)
BASO#: 0 10*3/uL (ref 0.0–0.2)
BASO%: 0.5 % (ref 0.0–2.0)
EOS%: 1.8 % (ref 0.0–7.0)
Eosinophils Absolute: 0.2 10*3/uL (ref 0.0–0.5)
HEMATOCRIT: 46.7 % — AB (ref 34.8–46.6)
HEMOGLOBIN: 17.3 g/dL — AB (ref 11.6–15.9)
LYMPH#: 1.8 10*3/uL (ref 0.9–3.3)
LYMPH%: 20.8 % (ref 14.0–48.0)
MCH: 40 pg — ABNORMAL HIGH (ref 26.0–34.0)
MCHC: 37 g/dL — ABNORMAL HIGH (ref 32.0–36.0)
MCV: 108 fL — AB (ref 81–101)
MONO#: 0.5 10*3/uL (ref 0.1–0.9)
MONO%: 5.8 % (ref 0.0–13.0)
NEUT%: 71.1 % (ref 39.6–80.0)
NEUTROS ABS: 6.1 10*3/uL (ref 1.5–6.5)
Platelets: 220 10*3/uL (ref 145–400)
RBC: 4.33 10*6/uL (ref 3.70–5.32)
RDW: 11.8 % (ref 11.1–15.7)
WBC: 8.6 10*3/uL (ref 3.9–10.0)

## 2016-04-08 LAB — CMP (CANCER CENTER ONLY)
ALT: 18 U/L (ref 10–47)
AST: 28 U/L (ref 11–38)
Albumin: 4 g/dL (ref 3.3–5.5)
Alkaline Phosphatase: 92 U/L — ABNORMAL HIGH (ref 26–84)
BUN: 8 mg/dL (ref 7–22)
CHLORIDE: 103 meq/L (ref 98–108)
CO2: 30 meq/L (ref 18–33)
CREATININE: 1.2 mg/dL (ref 0.6–1.2)
Calcium: 10 mg/dL (ref 8.0–10.3)
GLUCOSE: 115 mg/dL (ref 73–118)
Potassium: 4.7 mEq/L (ref 3.3–4.7)
Sodium: 139 mEq/L (ref 128–145)
TOTAL PROTEIN: 7.9 g/dL (ref 6.4–8.1)
Total Bilirubin: 0.7 mg/dl (ref 0.20–1.60)

## 2016-04-08 LAB — TECHNOLOGIST REVIEW CHCC SATELLITE

## 2016-04-08 NOTE — Telephone Encounter (Signed)
-----   Message from Volanda Napoleon, MD sent at 04/08/2016  4:46 PM EDT ----- Rob:  I was wondering if you could see this nice lady.  She was referred to me for an elevated CEA and multiple pulmonary nodules.  She was a heavy smoker. I sent her to [facility] and he did not think that she needed an open lung bx.  She has a recent CT scan today that showed resolution of some of the nodules but some new nodules showed up.  I think she has some inflammatory condition.   Can you please see her and maybe consider a FOB??  As always, thanks for your great help!! Laurey Arrow

## 2016-04-08 NOTE — Progress Notes (Signed)
Hematology and Oncology Follow Up Visit  VIDALIA OFLYNN [id number] 06-29-1960 55 y.o. 04/08/2016   Principle Diagnosis:   Multiple Pulmonary nodules - transient  Elevated CEA  Current Therapy:    Observation.     Interim History:  Ms. Gilliand is back for follow-up. So far, we have not yet confirmed any obvious cancer. We did send her to thoracic surgery. Dr. Roxan Hockey saw her. He did not feel that any type of open lung biopsy was indicated. He just wanted to follow-up with her CT scan in about 6 months.  She had a CT scan done today. The CT scan actually showed resolution of quite a few nodules. There were noted be a few new nodules. I think this is indicative of a non-malignant process and possibly some type of inflammatory or infectious process.  I still cannot explain the elevated CEA. Back in August it was 14. We've done scans on her which have not shown anything outside of that associated with the lung.  Her weight is holding relatively steady.  She is not smoking. She is eating okay. There is no nausea or vomiting.  She has not had any rashes. She's had no obvious bleeding or bruising. She's had no cough or shortness of breath.  Currently, her performance status is ECOG 1.  Medications:  Current Outpatient Prescriptions:  .  atenolol (TENORMIN) 50 MG tablet, Take 50 mg by mouth daily., Disp: , Rfl:  .  Biotin 5000 MCG TABS, Take by mouth daily., Disp: , Rfl:  .  Cholecalciferol (VITAMIN D-3) 5000 UNITS TABS, Take 10,000 Units by mouth daily after breakfast., Disp: , Rfl:  .  Cyanocobalamin (B-12) 5000 MCG CAPS, Take by mouth daily., Disp: , Rfl:  .  hydrALAZINE (APRESOLINE) 10 MG tablet, Take 1 tablet (10 mg total) by mouth 3 (three) times daily., Disp: 90 tablet, Rfl: 0 .  HYDROcodone-acetaminophen (NORCO/VICODIN) 5-325 MG per tablet, Take 1 tablet by mouth every 4 (four) hours as needed for moderate pain., Disp: 15 tablet, Rfl: 0 .  levothyroxine (SYNTHROID,  LEVOTHROID) 50 MCG tablet, Take 50 mcg by mouth daily before breakfast., Disp: , Rfl:  .  pantoprazole (PROTONIX) 20 MG tablet, , Disp: , Rfl: 4 .  Probiotic Product (PROBIOTIC-10 PO), Take by mouth daily., Disp: , Rfl:   Allergies: No Known Allergies  Past Medical History, Surgical history, Social history, and Family History were reviewed and updated.  Review of Systems: As above  Physical Exam:  weight is 92 lb (41.7 kg). Her oral temperature is 97.5 F (36.4 C). Her blood pressure is 171/74 (abnormal) and her pulse is 67. Her respiration is 20.   Wt Readings from Last 3 Encounters:  04/08/16 92 lb (41.7 kg)  02/27/16 93 lb (42.2 kg)  02/20/16 93 lb (42.2 kg)     Petite white female in no obvious distress. Head and neck exam shows no ocular or oral lesions. There are no palpable cervical or supraclavicular lymph nodes. Lungs are clear to percussion and auscultation bilaterally. Cardiac exam regular rate and rhythm with no murmurs, rubs or bruits. Abdomen is soft. She has good bowel sounds. There is no fluid wave. There is no palpable liver or spleen tip. Extremities shows no clubbing, cyanosis or edema. Neurological exam shows no focal neurological deficits. Skin exam shows no rashes, ecchymoses or petechia.  Lab Results  Component Value Date   WBC 8.6 04/08/2016   HGB 17.3 (H) 04/08/2016   HCT 46.7 (H) 04/08/2016   MCV  108 (H) 04/08/2016   PLT 220 04/08/2016     Chemistry      Component Value Date/Time   NA 139 04/08/2016 1352   NA 141 01/21/2016 1356   K 4.7 04/08/2016 1352   K 4.0 01/21/2016 1356   CL 103 04/08/2016 1352   CO2 30 04/08/2016 1352   CO2 25 01/21/2016 1356   BUN 8 04/08/2016 1352   BUN 10.5 01/21/2016 1356   CREATININE 1.2 04/08/2016 1352   CREATININE 1.2 (H) 01/21/2016 1356      Component Value Date/Time   CALCIUM 10.0 04/08/2016 1352   CALCIUM 9.7 01/21/2016 1356   ALKPHOS 92 (H) 04/08/2016 1352   ALKPHOS 101 01/21/2016 1356   AST 28 04/08/2016  1352   AST 25 01/21/2016 1356   ALT 18 04/08/2016 1352   ALT 17 01/21/2016 1356   BILITOT 0.70 04/08/2016 1352   BILITOT 0.51 01/21/2016 1356         Impression and Plan: Ms. Kirkley is A 55 year old white female. She has a transient pulmonary nodules. She has elevated CEA. So far, we have not found any obvious malignancy. I wouldn't think that she has any type of low-grade adenocarcinoma of the lung. I would think if this was a case that we would not be seeing resolution of the nodules.  She has had a heavy history of tobacco use. I think she has at least a 50-pack-year history of tobacco use. She is not smoking right now.  I think we probably need to get her over to pulmonary medicine. I that she may need a bronchoscopy with some transbronchial biopsies  I think this would be reasonable.  I'm still not sure that she has an underlying latency. I suppose with her past tobacco use that she may have elevated CEA from my point of view.  I'm not sure when we need to get her back here. I think that we will need to see what pulmonary medicine says first.  She is agreeable to go to pulmonary medicine for an evaluation.   Volanda Napoleon, MD 11/2/20174:35 PM

## 2016-04-08 NOTE — Telephone Encounter (Signed)
Please set this pt up as a new consult for me. Thanks.

## 2016-04-09 LAB — LACTATE DEHYDROGENASE: LDH: 209 U/L (ref 125–245)

## 2016-04-09 LAB — CEA (IN HOUSE-CHCC): CEA (CHCC-In House): 12.61 ng/mL — ABNORMAL HIGH (ref 0.00–5.00)

## 2016-04-09 NOTE — Telephone Encounter (Signed)
Spoke with pt. She has been scheduled to see RB on 05/07/16 at 9:15am. Nothing further was needed.

## 2016-05-07 ENCOUNTER — Other Ambulatory Visit: Payer: BLUE CROSS/BLUE SHIELD

## 2016-05-07 ENCOUNTER — Ambulatory Visit (INDEPENDENT_AMBULATORY_CARE_PROVIDER_SITE_OTHER): Payer: BLUE CROSS/BLUE SHIELD | Admitting: Emergency Medicine

## 2016-05-07 ENCOUNTER — Encounter: Payer: Self-pay | Admitting: Emergency Medicine

## 2016-05-07 DIAGNOSIS — R918 Other nonspecific abnormal finding of lung field: Secondary | ICD-10-CM | POA: Diagnosis not present

## 2016-05-07 DIAGNOSIS — J449 Chronic obstructive pulmonary disease, unspecified: Secondary | ICD-10-CM

## 2016-05-07 NOTE — Patient Instructions (Addendum)
We discussed your CT scans today - several of the nodular changes in your lungs have disappeared. This makes lung cancer unlikely. There are new spots present that suggest either inflammation or infection. This could be related to smoking or to mold exposure, and could be a cause of your weight loss.  We will arrange for bronchoscopy to evaluate for infectious causes of pulmonary nodules and weight loss.  We will plan to follow a repeat CT scan of the chest in February 2018.

## 2016-05-07 NOTE — Assessment & Plan Note (Signed)
We will increase combivent to 2 puffs 4x a day Albuterol prn.  PFT's  a1-AT testing.

## 2016-05-07 NOTE — Assessment & Plan Note (Addendum)
Scattered groundglass pulmonary nodules that have waxed and waned-some have disappeared in our surveillance interval. Etiology unclear but the differential diagnosis is broad. Given the fact that some of disappeared malignancy is unlikely. This could represent a vasculitis, pneumonitis, particularly RB-ILD given her tobacco use. Consider atypical infection.    - check ACE level, ANA, RF, ANCA today; a1-AT, hypersensitivty panel - need to stop smoking asap - discussed serial CT scan vs FOB to assess cx data; we have decide to go ahead with BAL given her sx, especially the wt loss. Unclear that TBBx will be possible as GGI has largely resolved.

## 2016-05-07 NOTE — Progress Notes (Signed)
Subjective:    Patient ID: [name], female    DOB: [birth year], 55 y.o.   MRN: [id number]  HPI 55 year old woman, current smoker (72 + pack years), history of hypertension, hypothyroidism, kidney stones, GERD.  She has been followed by Dr Katheran Awe in setting wt loss of 30 lbs over last year. She was found to have an elevated CEA (6.6 > 13). A Ct scan abd showed some GG nodules 01/27/16. This prompted a PET scan that was done on 02/06/16 that I have reviewed. This reidentified bilateral scattered ground glass nodules the largest of which was 8 mm in diameter in the. This had a mild hypermetabolic uptake with a value of 1.2. (nonspecific). A repeat CT scan of the chest on 04/08/16 was performed and I have reviewed. This shows some moderate changes of centrilobular emphysema diffuse bronchial wall thickening. Again there are upper lobe predominant groundglass nodular changes but some of these have decreased or resolved compared with prior imaging. There is a new sub-solid 9 mm nodule in the right upper lobe. She presents today for further evaluation of these lesions and her tobacco hx >> she has cut down to 1 cigarette a day. She was dx w COPD years ago, is on combivent bid. Has albuterol prn, rarely uses.    Review of Systems  Constitutional: Positive for appetite change and unexpected weight change. Negative for fever.  HENT: Negative for congestion, dental problem, ear pain, nosebleeds, postnasal drip, rhinorrhea, sinus pressure, sneezing, sore throat and trouble swallowing.   Eyes: Negative for redness and itching.  Respiratory: Positive for shortness of breath. Negative for cough, chest tightness and wheezing.   Cardiovascular: Positive for palpitations. Negative for leg swelling.  Gastrointestinal: Negative for nausea and vomiting.  Genitourinary: Negative for dysuria.  Musculoskeletal: Negative for joint swelling.  Skin: Negative for rash.  Neurological: Negative for headaches.    Hematological: Does not bruise/bleed easily.  Psychiatric/Behavioral: Negative for dysphoric mood. The patient is not nervous/anxious.     Past Medical History:  Diagnosis Date  . Arthritis    hands, back, knees  . Chronic kidney disease    stones  . Diverticulitis   . GERD (gastroesophageal reflux disease)   . Hypertension    somewhat controlled, taking medication  . Hypothyroidism      No family history on file.   Social History   Social History  . Marital status: Single    Spouse name: N/A  . Number of children: N/A  . Years of education: N/A   Occupational History  . Not on file.   Social History Main Topics  . Smoking status: Current Some Day Smoker    Packs/day: 0.10    Years: 42.00    Types: Cigarettes    Last attempt to quit: 07/07/2015  . Smokeless tobacco: Never Used  . Alcohol use 16.8 oz/week    28 Shots of liquor per week     Comment: 01/10/13 - quit 6 to 7 months ago, prior heavy use  . Drug use: No  . Sexual activity: No   Other Topics Concern  . Not on file   Social History Narrative  . 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   No Known Allergies   Outpatient Medications Prior to Visit  Medication Sig Dispense Refill  .  atenolol (TENORMIN) 50 MG tablet Take 50 mg by mouth daily.    . Biotin 5000 MCG TABS Take by mouth daily.    . Cholecalciferol (VITAMIN D-3) 5000 UNITS TABS Take 10,000 Units by mouth daily after breakfast.    . Cyanocobalamin (B-12) 5000 MCG CAPS Take by mouth daily.    . hydrALAZINE (APRESOLINE) 10 MG tablet Take 1 tablet (10 mg total) by mouth 3 (three) times daily. 90 tablet 0  . HYDROcodone-acetaminophen (NORCO/VICODIN) 5-325 MG per tablet Take 1 tablet by mouth every 4 (four) hours as needed for moderate pain. 15 tablet 0  . levothyroxine (SYNTHROID,  LEVOTHROID) 50 MCG tablet Take 50 mcg by mouth daily before breakfast.    . pantoprazole (PROTONIX) 20 MG tablet Take 20 mg by mouth daily.   4  . Probiotic Product (PROBIOTIC-10 PO) Take by mouth daily.     No facility-administered medications prior to visit.         Objective:   Physical Exam  Vitals:   05/07/16 0915 05/07/16 0917  BP:  110/72  Pulse:  60  SpO2:  99%  Weight: 93 lb (42.2 kg)   Height: 4\' 10"  (1.473 m)    Gen: Pleasant, well-nourished, in no distress,  normal affect  ENT: No lesions,  mouth clear,  oropharynx clear, no postnasal drip  Neck: No JVD, no TMG, no carotid bruits  Lungs: No use of accessory muscles, no dullness to percussion, clear without rales or rhonchi  Cardiovascular: RRR, heart sounds normal, no murmur or gallops, no peripheral edema  Musculoskeletal: No deformities, no cyanosis or clubbing  Neuro: alert, non focal  Skin: Warm, no lesions or rashes  04/08/16 --  COMPARISON:  02/06/2016  FINDINGS: Cardiovascular: The heart size is normal. Aortic atherosclerosis. Calcifications within the RCA, LAD and left circumflex coronary artery noted.  Mediastinum/Nodes: The trachea appears patent and is midline. No mediastinal or hilar adenopathy. No axillary or supraclavicular adenopathy.  Lungs/Pleura: There is no pleural fluid. Moderate changes of centrilobular emphysema. Diffuse bronchial wall thickening noted. Again noted are scattered peribronchovascular nodular densities which are upper lobe predominant. Index nodule scratch set the index 8 mm nodule within the left upper lobe (previous study image 38 of series 3) has resolved in the interval. The index solid-appearing nodule within the right upper lobe measuring 6 mm (previous study image 36 of series 3) appears less solid on today's exam, image 34 series 3. The previous sub solid nodule within the right upper lobe which measured 7 mm (previous exam image 44 of series 3)  has resolved in the interval. There is a new sub solid peribronchovascular nodule within the right upper lobe, image 35 of series 3. This measures 9 mm.  Upper Abdomen: Small stones are noted within the gallbladder. No acute findings identified.  Musculoskeletal: No aggressive lytic or sclerotic bone lesions.  IMPRESSION: 1. Again noted all are areas of peribronchovascular nodularity which appear upper lobe predominant. When compared with study from 01/27/2016 many in the previously noted nodules have resolved and are several new nodules. Findings are most likely related to an inflammatory or infectious process. Atypical infection not excluded. 2. Diffuse bronchial wall thickening with emphysema, as above; imaging findings suggestive of underlying COPD. 3. Aortic atherosclerosis and multi vessel coronary artery calcification      Assessment & Plan:  Pulmonary nodules/lesions, multiple Scattered groundglass pulmonary nodules that have waxed and waned-some have disappeared in our surveillance interval. Etiology unclear but the differential diagnosis is broad. Given  the fact that some of disappeared malignancy is unlikely. This could represent a vasculitis, pneumonitis, particularly RB-ILD given her tobacco use. Consider atypical infection.    - check ACE level, ANA, RF, ANCA today; a1-AT, hypersensitivty panel - need to stop smoking asap - discussed serial CT scan vs FOB to assess cx data; we have decide to go ahead with BAL given her sx, especially the wt loss. Unclear that TBBx will be possible as GGI has largely resolved.   COPD (chronic obstructive pulmonary disease) (HCC) We will increase combivent to 2 puffs 4x a day Albuterol prn.  PFT's  a1-AT testing.   Baltazar Apo, MD, PhD 05/07/2016, 1:30 PM [facility] [phone] or if no answer [phone]

## 2016-05-10 LAB — ANGIOTENSIN CONVERTING ENZYME: ANGIOTENSIN-CONVERTING ENZYME: 67 U/L (ref 9–67)

## 2016-05-10 LAB — RHEUMATOID FACTOR

## 2016-05-12 LAB — HYPERSENSITIVITY PNUEMONITIS PROFILE

## 2016-05-12 LAB — ANCA SCREEN W REFLEX TITER: ANCA SCREEN: POSITIVE — AB

## 2016-05-12 LAB — C-ANCA TITER

## 2016-05-13 LAB — ALPHA-1 ANTITRYPSIN PHENOTYPE: A-1 Antitrypsin: 195 mg/dL (ref 83–199)

## 2016-05-14 ENCOUNTER — Encounter (HOSPITAL_COMMUNITY)
Admission: RE | Disposition: A | Discharge status: Home / Self Care | Payer: Self-pay | Source: Ambulatory Visit | Attending: Emergency Medicine

## 2016-05-14 ENCOUNTER — Inpatient Hospital Stay (HOSPITAL_COMMUNITY)
Admission: RE | Admit: 2016-05-14 | Discharge: 2016-05-14 | Disposition: A | Discharge status: Home / Self Care | Payer: BLUE CROSS/BLUE SHIELD | Source: Ambulatory Visit

## 2016-05-14 ENCOUNTER — Ambulatory Visit (HOSPITAL_COMMUNITY)
Admission: RE | Admit: 2016-05-14 | Discharge: 2016-05-14 | Disposition: A | Discharge status: Home / Self Care | Payer: BLUE CROSS/BLUE SHIELD | Source: Ambulatory Visit | Attending: Emergency Medicine | Admitting: Emergency Medicine

## 2016-05-14 DIAGNOSIS — R918 Other nonspecific abnormal finding of lung field: Secondary | ICD-10-CM | POA: Diagnosis not present

## 2016-05-14 DIAGNOSIS — D141 Benign neoplasm of larynx: Secondary | ICD-10-CM | POA: Insufficient documentation

## 2016-05-14 DIAGNOSIS — E039 Hypothyroidism, unspecified: Secondary | ICD-10-CM | POA: Insufficient documentation

## 2016-05-14 DIAGNOSIS — J449 Chronic obstructive pulmonary disease, unspecified: Secondary | ICD-10-CM | POA: Insufficient documentation

## 2016-05-14 DIAGNOSIS — Z87442 Personal history of urinary calculi: Secondary | ICD-10-CM | POA: Diagnosis not present

## 2016-05-14 DIAGNOSIS — I129 Hypertensive chronic kidney disease with stage 1 through stage 4 chronic kidney disease, or unspecified chronic kidney disease: Secondary | ICD-10-CM | POA: Insufficient documentation

## 2016-05-14 DIAGNOSIS — K219 Gastro-esophageal reflux disease without esophagitis: Secondary | ICD-10-CM | POA: Diagnosis not present

## 2016-05-14 DIAGNOSIS — J387 Other diseases of larynx: Secondary | ICD-10-CM | POA: Diagnosis not present

## 2016-05-14 DIAGNOSIS — Z79899 Other long term (current) drug therapy: Secondary | ICD-10-CM | POA: Insufficient documentation

## 2016-05-14 DIAGNOSIS — F17210 Nicotine dependence, cigarettes, uncomplicated: Secondary | ICD-10-CM | POA: Diagnosis not present

## 2016-05-14 HISTORY — PX: VIDEO BRONCHOSCOPY: SHX5072

## 2016-05-14 LAB — BODY FLUID CELL COUNT WITH DIFFERENTIAL
LYMPHS FL: 4 %
MONOCYTE-MACROPHAGE-SEROUS FLUID: 80 % (ref 50–90)
Neutrophil Count, Fluid: 16 % (ref 0–25)
WBC FLUID: 73 uL (ref 0–1000)

## 2016-05-14 SURGERY — VIDEO BRONCHOSCOPY WITHOUT FLUORO
Anesthesia: Moderate Sedation | Laterality: Bilateral

## 2016-05-14 MED ORDER — SODIUM CHLORIDE 0.9 % IV SOLN
INTRAVENOUS | Status: DC
  Administered 2016-05-14: 08:00:00 via INTRAVENOUS

## 2016-05-14 MED ORDER — FENTANYL CITRATE (PF) 100 MCG/2ML IJ SOLN
INTRAMUSCULAR | Status: DC | PRN
  Administered 2016-05-14: 25 ug via INTRAVENOUS
  Administered 2016-05-14: 50 ug via INTRAVENOUS
  Administered 2016-05-14 (×2): 25 ug via INTRAVENOUS

## 2016-05-14 MED ORDER — MIDAZOLAM HCL 10 MG/2ML IJ SOLN
INTRAMUSCULAR | Status: DC | PRN
  Administered 2016-05-14 (×2): 2 mg via INTRAVENOUS
  Administered 2016-05-14 (×2): 1 mg via INTRAVENOUS

## 2016-05-14 MED ORDER — FENTANYL CITRATE (PF) 100 MCG/2ML IJ SOLN
INTRAMUSCULAR | Status: AC
  Filled 2016-05-14: qty 4

## 2016-05-14 MED ORDER — PHENYLEPHRINE HCL 0.25 % NA SOLN
NASAL | Status: DC | PRN
  Administered 2016-05-14: 2 via NASAL

## 2016-05-14 MED ORDER — MIDAZOLAM HCL 5 MG/ML IJ SOLN
INTRAMUSCULAR | Status: AC
  Filled 2016-05-14: qty 2

## 2016-05-14 MED ORDER — LIDOCAINE HCL (PF) 1 % IJ SOLN
INTRAMUSCULAR | Status: DC | PRN
  Administered 2016-05-14: 6 mL

## 2016-05-14 MED ORDER — LIDOCAINE HCL 2 % EX GEL
CUTANEOUS | Status: DC | PRN
  Administered 2016-05-14: 1

## 2016-05-14 NOTE — Op Note (Signed)
[facility] Cardiopulmonary Patient Name: [name] Pocedure Date: 05/14/2016 MRN: [id number] Attending MD: Collene Gobble , MD Date of Birth: [birth year] CSN: Finalized Age: 55 Admit Type: Outpatient Gender: Female Procedure:            Bronchoscopy Indications:          Bilateral infiltrate Providers:            Collene Gobble, MD, Cherre Huger RRT, RCP, Tammie                        Readling RRT,RCP Referring MD:          Medicines:            Midazolam 5 mg IV, Fentanyl 125 mcg IV, Lidocaine 1%                        applied to cords 8 mL, Lidocaine 1% applied to the                        tracheobronchial tree 20 mL Complications:        No immediate complications. Estimated blood loss:                        Minimal Estimated Blood Loss: Estimated blood loss was minimal. Procedure:            Pre-Anesthesia Assessment:                       - A History and Physical has been performed. Patient                        meds and allergies have been reviewed. The risks and                        benefits of the procedure and the sedation options and                        risks were discussed with the patient. All questions                        were answered and informed consent was obtained.                        Patient identification and proposed procedure were                        verified prior to the procedure by the physician in the                        procedure room. Mental Status Examination: alert and                        oriented. Airway Examination: normal oropharyngeal                        airway. Respiratory Examination: clear to auscultation.                        CV Examination: normal. ASA Grade Assessment: I - A  normal healthy patient. After reviewing the risks and                        benefits, the patient was deemed in satisfactory                        condition to undergo the procedure. The anesthesia plan                         was to use moderate sedation / analgesia (conscious                        sedation). Immediately prior to administration of                        medications, the patient was re-assessed for adequacy                        to receive sedatives. The heart rate, respiratory rate,                        oxygen saturations, blood pressure, adequacy of                        pulmonary ventilation, and response to care were                        monitored throughout the procedure. The physical status                        of the patient was re-assessed after the procedure.                       After obtaining informed consent, the bronchoscope was                        passed under direct vision. Throughout the procedure,                        the patient's blood pressure, pulse, and oxygen                        saturations were monitored continuously. the [id number]                        [id number] scope was introduced through the left nostril                        and advanced to the tracheobronchial tree. The                        procedure was accomplished without difficulty. The                        patient tolerated the procedure well. The total                        duration of the procedure was 21 minutes. Scope In: 8:53:30 AM Scope Out: 9:14:53 AM Findings:      The nasopharynx/oropharynx appears normal. The larynx appears  normal       with the exception of cobblestone changes and some discrete, raised       erythematous nodular ares on the epiglottis. These were biopsied with       forceps. The vocal cords appear normal. The subglottic space is normal.       The trachea is of normal caliber. The carina is sharp. The       tracheobronchial tree was examined to at least the first subsegmental       level. Bronchial mucosa and anatomy are normal; there are no       endobronchial lesions, and no secretions.      Bronchoalveolar lavage was performed in the  RUL apical segment (B1) of       the lung and sent for cell count, bacterial culture, viral smears &       culture, and fungal & AFB analysis and cytology. 90 mL of fluid were       instilled. 23 mL were returned. The return was cloudy. There were no       mucoid plugs in the return fluid. Multiple specimens were obtained and       pooled into one specimen, which was sent for analysis.      Biopsies of several small erythematous nosular lesions were performed on       teh epiglottis using a forceps and sent for histopathology examination.       Three samples were obtained. Impression:           - Bilateral infiltrates, waxing and waning                       - The airway examination was normal.                       - Several discrete red raised nodular ares noted on the                        epiglottis were biopsied                       - Bronchoalveolar lavage was performed. Moderate Sedation:      Moderate (conscious) sedation was personally administered by the       endoscopist. The following parameters were monitored: oxygen saturation,       heart rate, blood pressure, respiratory rate, EKG, adequacy of pulmonary       ventilation, and response to care. Total physician intraservice time was       39 minutes. Recommendation:       - Await BAL and biopsy results.                       - Follow up with bronchoscopist as previously scheduled. Procedure Code(s):    --- Professional ---                       [phone], Bronchoscopy, rigid or flexible, including                        fluoroscopic guidance, when performed; with bronchial                        or endobronchial biopsy(s), single or multiple sites  [id number], Bronchoscopy, rigid or flexible, including                        fluoroscopic guidance, when performed; with bronchial                        alveolar lavage                       99152, Moderate sedation services provided by the same                         physician or other qualified health care professional                        performing the diagnostic or therapeutic service that                        the sedation supports, requiring the presence of an                        independent trained observer to assist in the                        monitoring of the patient's level of consciousness and                        physiological status; initial 15 minutes of                        intraservice time, patient age 72 years or older                       [phone], Moderate sedation services; each additional 15                        minutes intraservice time                       99153, Moderate sedation services; each additional 15                        minutes intraservice time Diagnosis Code(s):    --- Professional ---                       R91.8, Other nonspecific abnormal finding of lung field CPT copyright 2016 [employer] Association. All rights reserved. The codes documented in this report are preliminary and upon coder review may  be revised to meet current compliance requirements. Collene Gobble, MD Collene Gobble, MD 05/14/2016 9:40:31 AM Number of Addenda: 0

## 2016-05-14 NOTE — Interval H&P Note (Signed)
PCCM Interval Note  No new issues reported. Pt presents today for FOB + BAL to evaluate waxing / waning GGI.  Understands risks, benefits procedure and agrees to proceed.   Vitals:   05/14/16 0810 05/14/16 0815 05/14/16 0825 05/14/16 0830  BP: (!) 160/68 (!) 162/66 (!) 158/77 (!) 120/93  Pulse: (!) 57 (!) 52 (!) 55 (!) 58  Resp: 16 20 13 15   Temp:      TempSrc:      SpO2: 99% 99% 99% 98%  Weight:      Height:       Gen: Pleasant, thin woman, in no distress,  normal affect  ENT: No lesions,  mouth clear,  oropharynx clear, no postnasal drip, ? Some slurred speech this am  Neck: No JVD, no TMG, no carotid bruits  Lungs: No use of accessory muscles, clear without rales or rhonchi  Cardiovascular: RRR, heart sounds normal, no murmur or gallops, no peripheral edema  Musculoskeletal: No deformities, no cyanosis or clubbing  Neuro: alert, non focal  Skin: Warm, no lesions or rashes  Plans:  FOB + BAL this am under conscious sedation.   Baltazar Apo, MD, PhD 05/14/2016, 8:46 AM [facility] [phone] or if no answer [phone]

## 2016-05-14 NOTE — H&P (View-Only) (Signed)
Subjective:    Patient ID: [name], female    DOB: 1960-11-01, 56 y.o.   MRN: [id number]  HPI 55 year old woman, current smoker (16 + pack years), history of hypertension, hypothyroidism, kidney stones, GERD.  She has been followed by Dr Katheran Awe in setting wt loss of 30 lbs over last year. She was found to have an elevated CEA (6.6 > 13). A Ct scan abd showed some GG nodules 01/27/16. This prompted a PET scan that was done on 02/06/16 that I have reviewed. This reidentified bilateral scattered ground glass nodules the largest of which was 8 mm in diameter in the. This had a mild hypermetabolic uptake with a value of 1.2. (nonspecific). A repeat CT scan of the chest on 04/08/16 was performed and I have reviewed. This shows some moderate changes of centrilobular emphysema diffuse bronchial wall thickening. Again there are upper lobe predominant groundglass nodular changes but some of these have decreased or resolved compared with prior imaging. There is a new sub-solid 9 mm nodule in the right upper lobe. She presents today for further evaluation of these lesions and her tobacco hx >> she has cut down to 1 cigarette a day. She was dx w COPD years ago, is on combivent bid. Has albuterol prn, rarely uses.    Review of Systems  Constitutional: Positive for appetite change and unexpected weight change. Negative for fever.  HENT: Negative for congestion, dental problem, ear pain, nosebleeds, postnasal drip, rhinorrhea, sinus pressure, sneezing, sore throat and trouble swallowing.   Eyes: Negative for redness and itching.  Respiratory: Positive for shortness of breath. Negative for cough, chest tightness and wheezing.   Cardiovascular: Positive for palpitations. Negative for leg swelling.  Gastrointestinal: Negative for nausea and vomiting.  Genitourinary: Negative for dysuria.  Musculoskeletal: Negative for joint swelling.  Skin: Negative for rash.  Neurological: Negative for headaches.    Hematological: Does not bruise/bleed easily.  Psychiatric/Behavioral: Negative for dysphoric mood. The patient is not nervous/anxious.     Past Medical History:  Diagnosis Date  . Arthritis    hands, back, knees  . Chronic kidney disease    stones  . Diverticulitis   . GERD (gastroesophageal reflux disease)   . Hypertension    somewhat controlled, taking medication  . Hypothyroidism      No family history on file.   Social History   Social History  . Marital status: Single    Spouse name: N/A  . Number of children: N/A  . Years of education: N/A   Occupational History  . Not on file.   Social History Main Topics  . Smoking status: Current Some Day Smoker    Packs/day: 0.10    Years: 42.00    Types: Cigarettes    Last attempt to quit: 07/07/2015  . Smokeless tobacco: Never Used  . Alcohol use 16.8 oz/week    28 Shots of liquor per week     Comment: 01/10/13 - quit 6 to 7 months ago, prior heavy use  . Drug use: No  . Sexual activity: No   Other Topics Concern  . Not on file   Social History Narrative  . 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   No Known Allergies   Outpatient Medications Prior to Visit  Medication Sig Dispense Refill  .  atenolol (TENORMIN) 50 MG tablet Take 50 mg by mouth daily.    . Biotin 5000 MCG TABS Take by mouth daily.    . Cholecalciferol (VITAMIN D-3) 5000 UNITS TABS Take 10,000 Units by mouth daily after breakfast.    . Cyanocobalamin (B-12) 5000 MCG CAPS Take by mouth daily.    . hydrALAZINE (APRESOLINE) 10 MG tablet Take 1 tablet (10 mg total) by mouth 3 (three) times daily. 90 tablet 0  . HYDROcodone-acetaminophen (NORCO/VICODIN) 5-325 MG per tablet Take 1 tablet by mouth every 4 (four) hours as needed for moderate pain. 15 tablet 0  . levothyroxine (SYNTHROID,  LEVOTHROID) 50 MCG tablet Take 50 mcg by mouth daily before breakfast.    . pantoprazole (PROTONIX) 20 MG tablet Take 20 mg by mouth daily.   4  . Probiotic Product (PROBIOTIC-10 PO) Take by mouth daily.     No facility-administered medications prior to visit.         Objective:   Physical Exam  Vitals:   05/07/16 0915 05/07/16 0917  BP:  110/72  Pulse:  60  SpO2:  99%  Weight: 93 lb (42.2 kg)   Height: 4\' 10"  (1.473 m)    Gen: Pleasant, well-nourished, in no distress,  normal affect  ENT: No lesions,  mouth clear,  oropharynx clear, no postnasal drip  Neck: No JVD, no TMG, no carotid bruits  Lungs: No use of accessory muscles, no dullness to percussion, clear without rales or rhonchi  Cardiovascular: RRR, heart sounds normal, no murmur or gallops, no peripheral edema  Musculoskeletal: No deformities, no cyanosis or clubbing  Neuro: alert, non focal  Skin: Warm, no lesions or rashes  04/08/16 --  COMPARISON:  02/06/2016  FINDINGS: Cardiovascular: The heart size is normal. Aortic atherosclerosis. Calcifications within the RCA, LAD and left circumflex coronary artery noted.  Mediastinum/Nodes: The trachea appears patent and is midline. No mediastinal or hilar adenopathy. No axillary or supraclavicular adenopathy.  Lungs/Pleura: There is no pleural fluid. Moderate changes of centrilobular emphysema. Diffuse bronchial wall thickening noted. Again noted are scattered peribronchovascular nodular densities which are upper lobe predominant. Index nodule scratch set the index 8 mm nodule within the left upper lobe (previous study image 38 of series 3) has resolved in the interval. The index solid-appearing nodule within the right upper lobe measuring 6 mm (previous study image 36 of series 3) appears less solid on today's exam, image 34 series 3. The previous sub solid nodule within the right upper lobe which measured 7 mm (previous exam image 44 of series 3)  has resolved in the interval. There is a new sub solid peribronchovascular nodule within the right upper lobe, image 35 of series 3. This measures 9 mm.  Upper Abdomen: Small stones are noted within the gallbladder. No acute findings identified.  Musculoskeletal: No aggressive lytic or sclerotic bone lesions.  IMPRESSION: 1. Again noted all are areas of peribronchovascular nodularity which appear upper lobe predominant. When compared with study from 01/27/2016 many in the previously noted nodules have resolved and are several new nodules. Findings are most likely related to an inflammatory or infectious process. Atypical infection not excluded. 2. Diffuse bronchial wall thickening with emphysema, as above; imaging findings suggestive of underlying COPD. 3. Aortic atherosclerosis and multi vessel coronary artery calcification      Assessment & Plan:  Pulmonary nodules/lesions, multiple Scattered groundglass pulmonary nodules that have waxed and waned-some have disappeared in our surveillance interval. Etiology unclear but the differential diagnosis is broad. Given  the fact that some of disappeared malignancy is unlikely. This could represent a vasculitis, pneumonitis, particularly RB-ILD given her tobacco use. Consider atypical infection.    - check ACE level, ANA, RF, ANCA today; a1-AT, hypersensitivty panel - need to stop smoking asap - discussed serial CT scan vs FOB to assess cx data; we have decide to go ahead with BAL given her sx, especially the wt loss. Unclear that TBBx will be possible as GGI has largely resolved.   COPD (chronic obstructive pulmonary disease) (HCC) We will increase combivent to 2 puffs 4x a day Albuterol prn.  PFT's  a1-AT testing.   Baltazar Apo, MD, PhD 05/07/2016, 1:30 PM [facility] [phone] or if no answer [phone]

## 2016-05-14 NOTE — Discharge Instructions (Signed)
Flexible Bronchoscopy, Care After °These instructions give you information on caring for yourself after your procedure. Your doctor may also give you more specific instructions. Call your doctor if you have any problems or questions after your procedure. °Follow these instructions at home: °· Do not eat or drink anything for 2 hours after your procedure. If you try to eat or drink before the medicine wears off, food or drink could go into your lungs. You could also burn yourself. °· After 2 hours have passed and when you can cough and gag normally, you may eat soft food and drink liquids slowly. °· The day after the test, you may eat your normal diet. °· You may do your normal activities. °· Keep all doctor visits. °Get help right away if: °· You get more and more short of breath. °· You get light-headed. °· You feel like you are going to pass out (faint). °· You have chest pain. °· You have new problems that worry you. °· You cough up more than a little blood. °· You cough up more blood than before. °This information is not intended to replace advice given to you by your health care provider. Make sure you discuss any questions you have with your health care provider. °Document Released: 03/21/2009 Document Revised: 10/30/2015 Document Reviewed: 01/26/2013 °Elsevier Interactive Patient Education © 2017 [employer] ° °PLEASE CALL OUR OFFICE FOR ANY QUESTIONS OR CONCERNS [phone].  ° ° °

## 2016-05-14 NOTE — Progress Notes (Signed)
Video bronchoscopy with washing intervention, biopsy intervention.  Instruction given to [place]/ boyfriend. To eat at 1130 am, tylenol for pain an fever if needed.

## 2016-05-15 LAB — ACID FAST SMEAR (AFB): ACID FAST SMEAR - AFSCU2: NEGATIVE

## 2016-05-15 LAB — ACID FAST SMEAR (AFB, MYCOBACTERIA)

## 2016-05-17 ENCOUNTER — Encounter (HOSPITAL_COMMUNITY): Payer: Self-pay | Admitting: Emergency Medicine

## 2016-05-17 LAB — CULTURE, BAL-QUANTITATIVE: SPECIAL REQUESTS: NORMAL

## 2016-05-17 LAB — CULTURE, BAL-QUANTITATIVE W GRAM STAIN: Culture: 60000 — AB

## 2016-06-10 LAB — FUNGUS CULTURE WITH STAIN

## 2016-06-10 LAB — FUNGUS CULTURE RESULT

## 2016-06-10 LAB — FUNGAL ORGANISM REFLEX

## 2016-06-17 ENCOUNTER — Ambulatory Visit: Payer: BLUE CROSS/BLUE SHIELD | Admitting: Emergency Medicine

## 2016-06-26 LAB — ACID FAST CULTURE WITH REFLEXED SENSITIVITIES: ACID FAST CULTURE - AFSCU3: NEGATIVE

## 2016-06-30 ENCOUNTER — Telehealth: Payer: Self-pay | Admitting: Emergency Medicine

## 2016-06-30 NOTE — Telephone Encounter (Signed)
Patient was a no show for FOB last month. Need to track her down and see if we can get her in for an OV to decide next steps. Thanks.

## 2016-07-01 NOTE — Telephone Encounter (Signed)
lmtcb x1 for pt. 

## 2016-07-02 NOTE — Telephone Encounter (Signed)
lmtcb x2 for pt. 

## 2016-07-06 NOTE — Telephone Encounter (Signed)
lmtcb x3 for pt. 

## 2016-07-08 NOTE — Telephone Encounter (Signed)
I have attempted to contact pt on several occasions with no call back. Message will be closed per triage protocol.

## 2016-07-28 ENCOUNTER — Other Ambulatory Visit: Payer: Self-pay | Admitting: Thoracic Surgery (Cardiothoracic Vascular Surgery)

## 2016-07-28 DIAGNOSIS — R918 Other nonspecific abnormal finding of lung field: Secondary | ICD-10-CM

## 2016-08-31 ENCOUNTER — Other Ambulatory Visit: Payer: BLUE CROSS/BLUE SHIELD

## 2016-08-31 ENCOUNTER — Ambulatory Visit: Payer: BLUE CROSS/BLUE SHIELD | Admitting: Thoracic Surgery (Cardiothoracic Vascular Surgery)

## 2016-11-26 ENCOUNTER — Encounter: Payer: Self-pay | Admitting: Emergency Medicine

## 2017-07-25 ENCOUNTER — Other Ambulatory Visit: Payer: Self-pay | Admitting: Adult Health

## 2017-07-25 DIAGNOSIS — R109 Unspecified abdominal pain: Secondary | ICD-10-CM

## 2017-07-25 DIAGNOSIS — N939 Abnormal uterine and vaginal bleeding, unspecified: Secondary | ICD-10-CM

## 2017-07-27 ENCOUNTER — Encounter: Payer: Self-pay | Admitting: Hematology & Oncology

## 2017-08-03 ENCOUNTER — Ambulatory Visit
Admission: RE | Admit: 2017-08-03 | Discharge: 2017-08-03 | Disposition: A | Discharge status: Home / Self Care | Payer: BLUE CROSS/BLUE SHIELD | Source: Ambulatory Visit | Attending: Adult Health | Admitting: Adult Health

## 2017-08-03 DIAGNOSIS — R109 Unspecified abdominal pain: Secondary | ICD-10-CM | POA: Diagnosis present

## 2017-08-03 DIAGNOSIS — N939 Abnormal uterine and vaginal bleeding, unspecified: Secondary | ICD-10-CM | POA: Insufficient documentation

## 2017-08-03 DIAGNOSIS — N281 Cyst of kidney, acquired: Secondary | ICD-10-CM | POA: Diagnosis not present

## 2017-08-03 DIAGNOSIS — Z9049 Acquired absence of other specified parts of digestive tract: Secondary | ICD-10-CM | POA: Diagnosis not present

## 2017-08-03 MED ORDER — IOPAMIDOL (ISOVUE-300) INJECTION 61%
75.0000 mL | Freq: Once | INTRAVENOUS | Status: AC | PRN
  Administered 2017-08-03: 75 mL via INTRAVENOUS

## 2017-08-15 ENCOUNTER — Encounter: Payer: Self-pay | Admitting: *Deleted

## 2017-08-15 ENCOUNTER — Encounter: Payer: Self-pay | Admitting: Hematology and Oncology

## 2017-08-15 ENCOUNTER — Other Ambulatory Visit: Payer: Self-pay

## 2017-08-15 ENCOUNTER — Inpatient Hospital Stay: Payer: BLUE CROSS/BLUE SHIELD | Attending: Hematology and Oncology | Admitting: Hematology and Oncology

## 2017-08-15 ENCOUNTER — Inpatient Hospital Stay: Payer: BLUE CROSS/BLUE SHIELD

## 2017-08-15 VITALS — BP 101/67 | HR 79 | Resp 18 | Ht 59.0 in | Wt 83.0 lb

## 2017-08-15 DIAGNOSIS — R970 Elevated carcinoembryonic antigen [CEA]: Secondary | ICD-10-CM

## 2017-08-15 DIAGNOSIS — R634 Abnormal weight loss: Secondary | ICD-10-CM | POA: Diagnosis not present

## 2017-08-15 DIAGNOSIS — Z803 Family history of malignant neoplasm of breast: Secondary | ICD-10-CM | POA: Diagnosis not present

## 2017-08-15 DIAGNOSIS — D7589 Other specified diseases of blood and blood-forming organs: Secondary | ICD-10-CM

## 2017-08-15 DIAGNOSIS — R918 Other nonspecific abnormal finding of lung field: Secondary | ICD-10-CM | POA: Diagnosis not present

## 2017-08-15 DIAGNOSIS — J439 Emphysema, unspecified: Secondary | ICD-10-CM | POA: Insufficient documentation

## 2017-08-15 DIAGNOSIS — M545 Low back pain: Secondary | ICD-10-CM | POA: Diagnosis not present

## 2017-08-15 DIAGNOSIS — Z87442 Personal history of urinary calculi: Secondary | ICD-10-CM | POA: Diagnosis not present

## 2017-08-15 DIAGNOSIS — K259 Gastric ulcer, unspecified as acute or chronic, without hemorrhage or perforation: Secondary | ICD-10-CM | POA: Diagnosis not present

## 2017-08-15 DIAGNOSIS — F17210 Nicotine dependence, cigarettes, uncomplicated: Secondary | ICD-10-CM | POA: Insufficient documentation

## 2017-08-15 DIAGNOSIS — I10 Essential (primary) hypertension: Secondary | ICD-10-CM | POA: Diagnosis not present

## 2017-08-15 DIAGNOSIS — K219 Gastro-esophageal reflux disease without esophagitis: Secondary | ICD-10-CM | POA: Diagnosis not present

## 2017-08-15 DIAGNOSIS — Z79899 Other long term (current) drug therapy: Secondary | ICD-10-CM | POA: Diagnosis not present

## 2017-08-15 DIAGNOSIS — Z801 Family history of malignant neoplasm of trachea, bronchus and lung: Secondary | ICD-10-CM | POA: Diagnosis not present

## 2017-08-15 DIAGNOSIS — N939 Abnormal uterine and vaginal bleeding, unspecified: Secondary | ICD-10-CM | POA: Insufficient documentation

## 2017-08-15 DIAGNOSIS — K5900 Constipation, unspecified: Secondary | ICD-10-CM | POA: Insufficient documentation

## 2017-08-15 DIAGNOSIS — E039 Hypothyroidism, unspecified: Secondary | ICD-10-CM | POA: Diagnosis not present

## 2017-08-15 DIAGNOSIS — Z800 Family history of malignant neoplasm of digestive organs: Secondary | ICD-10-CM | POA: Insufficient documentation

## 2017-08-15 DIAGNOSIS — Z1239 Encounter for other screening for malignant neoplasm of breast: Secondary | ICD-10-CM

## 2017-08-15 HISTORY — DX: Elevated carcinoembryonic antigen (CEA): R97.0

## 2017-08-15 HISTORY — DX: Other specified diseases of blood and blood-forming organs: D75.89

## 2017-08-15 LAB — COMPREHENSIVE METABOLIC PANEL
ALT: 10 U/L — AB (ref 14–54)
AST: 21 U/L (ref 15–41)
Albumin: 4.5 g/dL (ref 3.5–5.0)
Alkaline Phosphatase: 65 U/L (ref 38–126)
Anion gap: 9 (ref 5–15)
BILIRUBIN TOTAL: 0.6 mg/dL (ref 0.3–1.2)
BUN: 16 mg/dL (ref 6–20)
CHLORIDE: 104 mmol/L (ref 101–111)
CO2: 26 mmol/L (ref 22–32)
Calcium: 10.2 mg/dL (ref 8.9–10.3)
Creatinine, Ser: 1.09 mg/dL — ABNORMAL HIGH (ref 0.44–1.00)
GFR, EST NON AFRICAN AMERICAN: 56 mL/min — AB (ref >60)
Glucose, Bld: 111 mg/dL — ABNORMAL HIGH (ref 65–99)
Potassium: 4.4 mmol/L (ref 3.5–5.1)
Sodium: 139 mmol/L (ref 135–145)
TOTAL PROTEIN: 7.5 g/dL (ref 6.5–8.1)

## 2017-08-15 LAB — CBC WITH DIFFERENTIAL/PLATELET
BASOS ABS: 0.1 10*3/uL (ref 0–0.1)
BASOS PCT: 1 %
EOS ABS: 0.2 10*3/uL (ref 0–0.7)
Eosinophils Relative: 3 %
HCT: 45.4 % (ref 35.0–47.0)
Hemoglobin: 15.8 g/dL (ref 12.0–16.0)
LYMPHS ABS: 2.1 10*3/uL (ref 1.0–3.6)
Lymphocytes Relative: 25 %
MCH: 37.5 pg — ABNORMAL HIGH (ref 26.0–34.0)
MCHC: 34.9 g/dL (ref 32.0–36.0)
MCV: 107.3 fL — ABNORMAL HIGH (ref 80.0–100.0)
Monocytes Absolute: 0.5 10*3/uL (ref 0.2–0.9)
Monocytes Relative: 7 %
NEUTROS PCT: 64 %
Neutro Abs: 5.3 10*3/uL (ref 1.4–6.5)
Platelets: 221 10*3/uL (ref 150–440)
RBC: 4.23 MIL/uL (ref 3.80–5.20)
RDW: 12.6 % (ref 11.5–14.5)
WBC: 8.2 10*3/uL (ref 3.6–11.0)

## 2017-08-15 LAB — FOLATE: Folate: 20.4 ng/mL (ref >5.9)

## 2017-08-15 LAB — T4, FREE: FREE T4: 1.22 ng/dL — AB (ref 0.61–1.12)

## 2017-08-15 LAB — VITAMIN B12: Vitamin B-12: 3503 pg/mL — ABNORMAL HIGH (ref 180–914)

## 2017-08-15 LAB — RETICULOCYTES
RBC.: 4.3 MIL/uL (ref 3.80–5.20)
RETIC COUNT ABSOLUTE: 30.1 10*3/uL (ref 19.0–183.0)
RETIC CT PCT: 0.7 % (ref 0.4–3.1)

## 2017-08-15 NOTE — Progress Notes (Signed)
[facility] day:  08/15/2017  Chief Complaint: [name] is a 57 y.o. female with elevated tumor markers and weight loss who is referred in consultation by Alvester Chou, NP for assessment and management.  HPI:  The patient history of weight loss and elevated CEA.  She was initially seen by Dr. Burney Gauze on 01/21/2016.  Weight had decreased from 140-150 pounds to 92 pounds over an unspecified period time.  CEA was 6.6.  Hepatitis, HIV testing TSH and celiac testing were negative.  It was noted that she had multiple endoscopies in the past to GI bleeding only finding of diverticulosis.  She has a 50-pack-year smoking history.  Chest, abdomen, and pelvic CT on 01/27/2016 revealed multiple ill-defined pulmonary nodules up to 8 mm.  There was no mediastinal adenopathy.  There was no mass or adenopathy in the abdomen or pelvis.  PET scan on 02/06/2016 revealed ground glass pulmonary nodules up to 8 mm (max SUV 1.2), possibly inflammatory or neoplastic.  She was last seen by Dr. Marin Olp on 04/08/2016.  Chest CT on 04/08/2016 revealed predominant upper lobe areas of peribronchial peribronchovascular nodularity.  Compared to the study from 01/27/2016, many nodules had resolved and there were several new nodules.  Findings were most likely related to an inflammatory or infectious process.  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  Weight was stable.    She was referred to pulmonary medicine.  Bronchoscopy on 05/14/2016 by Dr Baltazar Apo revealed no endobronchial lesions.  Epiglottis biopsy revealed squamous papilloma.  Weight eventually improved to 103 pounds.  She was seen in follow-up on 08/08/2017 by Alvester Chou.   She noted 6-8 months history of abdominal bloating, nausea and progressive weight loss.  Weight was down to 82 pounds.  She had severe abdominal pain.  She was eating 6 extremely small meals a day due to nausea and inability to  tolerate normal portions.  She vomited infrequently.  She described one episode of vaginal bleeding when she got out of the shower which covered a towel.  She is status post hysterectomy (she is s/p bilateral oophorectomy, but is unsure if her uterus remains).  CEA was 13.87 on 01/21/2016 and 12.61 on 04/08/2016.  CBC on 01/13/2017 revealed a hematocrit of 43.8, hemoglobin 14.8, MCV 108, platelets 318,000, white count 7400 and ANCC of 4800.  Creatinine was 1.26.  Calcium was 10.1.  Celiac disease panel on 01/13/2017 was negative. SPEP on 01/28/2017 revealed no monoclonal protein.    CBC on 05/02/2017 included a hematocrit of 43.6, hemoglobin 14.7, MCV 106, platelets 269,000, white count 9700 with an ANC of 6700.  Creatinine was 1.32. Calcium was 10.    CBC on 07/14/2017 included a hematocrit of 44.8, hemoglobin 15.2, MCV 107, platelets 242,000, white count 8400 with an [place] of 5600.  CEA was 11.4.  TSH was 0.88 (normal).  ANA was negative.  RPR was nonreactive.  C-reactive protein was 1.7.  CMP on 07/19/2017 revealed a creatinine of 1.35.  Calcium was 10.6 with a protein of 7.1 and an albumin of 4.6.  Liver function tests were normal.  CXR on 07/18/2017 revealed clear lungs.  Abdomen and pelvic CT on 08/03/2017 revealed no CT evidence for acute intra-abdominal or pelvic abnormality.  There was an enlarged extrahepatic bile duct post cholecystectomy, no change in likely due to postsurgical dilatation.  There was a cyst in the right kidney that demonstrated slight increased density values but was stable  in size compared to multiple prior exams.  EGD by Dr. Laurence Spates on 01/07/2012 revealed a normal esophagus and duodenum.  There was a small antral gastric ulcer.  Colonoscopy by Dr. Wilford Corner on 01/09/2012 revealed diffuse diverticulosis, medium size internal hemorrhoids, and a small polyp s/p snare cautery (no path obtained).  Mammogram on 07/07/2015 revealed no evidence of malignancy.  The  patient notes that approximately 2 weeks ago she was in the shower and appreciated "blood everywhere". Patient advises that the blood was from her vagina. Patient has had prolonged vaginal discharge for over a month an a half. She is scheduled to see GYN on 08/17/2017.  She continues to have bloating, nausea, and lower abdominal tenderness. Patient denies vomiting. She has experienced night sweats for the last 2 years. She denies fevers. She has not experienced any shortness of breath, cough, or chest pain. She has recent constipation. Patient started on a stool softener about a month ago, which made her "more regular". Patient denies bleeding; no hematochezia, melena, or gross hematuria. Patient denies post-bath pruritis.   She has chronic lower back pain. She has been seen in consult by a "back doctor". She is followed by pain management. Patient's diet is overall poor. She eats meat "every once in awhile". Patient grows her own vegetables. She eats green leafy vegetables on a daily basis. Patient denies ice pica and restless leg symptoms.   She takes oral B12 and vitamin D supplements daily. Patient does not take supplemental calcium. Patient questioned about depression. Patient states, "I think that it is my nerves. I am anxious about what is going on with me".  Patient tearful during her clinic visit.   She states that her mother was diagnosed with cancer at age 55 and died at age 83.  She had throat cancer, breast cancer, stomach cancer, and a jaw mass as "big as a football".   Past Medical History:  Diagnosis Date  . Arthritis    hands, back, knees  . Chronic kidney disease    stones  . Diverticulitis   . GERD (gastroesophageal reflux disease)   . Hypertension    somewhat controlled, taking medication  . Hypothyroidism     Past Surgical History:  Procedure Laterality Date  . ABDOMINAL HYSTERECTOMY    . APPENDECTOMY    . CHOLECYSTECTOMY    . COLONOSCOPY  01/09/2012   Procedure:  COLONOSCOPY;  Surgeon: Lear Ng, MD;  Location: [facility] ENDOSCOPY;  Service: Endoscopy;  Laterality: N/A;  . ESOPHAGOGASTRODUODENOSCOPY  01/07/2012   Procedure: ESOPHAGOGASTRODUODENOSCOPY (EGD);  Surgeon: Winfield Cunas., MD;  Location: [facility] ENDOSCOPY;  Service: Endoscopy;  Laterality: N/A;  . VIDEO BRONCHOSCOPY Bilateral 05/14/2016   Procedure: VIDEO BRONCHOSCOPY WITHOUT FLUORO;  Surgeon: Collene Gobble, MD;  Location: [facility];  Service: Cardiopulmonary;  Laterality: Bilateral;    Family History  Problem Relation Age of Onset  . Cancer Mother     Social History:  reports that she has been smoking cigarettes.  She has a 84.00 pack-year smoking history. she has never used smokeless tobacco. She reports that she drinks about 16.8 oz of alcohol per week. She reports that she does not use drugs. She previously smoked 2 packs/day x 42 years.  She cut back smoking 1 1/2 years ago.  She currently smokes 2 cigarettes/day.  Former heavy alcohol (beer) drinker; "I drank a case of beer every other day". Patient currently drinks a single glass of wine every other day. Patient is employed at  [employer] as a Clinical research associate. Patient denies known exposures to radiation on toxins.  Lives with her boyfriend of 15 years. The patient is accompanied by by [name] (nurse navigator) today.  Allergies: No Known Allergies  Current Medications: Current Outpatient Medications  Medication Sig Dispense Refill  . ALPRAZolam (XANAX) 0.5 MG tablet Take 1 tablet by mouth 3 (three) times daily.  2  . atenolol (TENORMIN) 50 MG tablet Take 50 mg by mouth daily.    . Biotin 5000 MCG TABS Take by mouth daily.    . Cholecalciferol (VITAMIN D-3) 5000 UNITS TABS Take 10,000 Units by mouth daily after breakfast.    . Cyanocobalamin (B-12) 5000 MCG CAPS Take by mouth daily.    . cyclobenzaprine (FLEXERIL) 10 MG tablet Take 1 tablet by mouth 3 (three) times daily.  1  . hydrALAZINE (APRESOLINE) 10 MG tablet Take 1 tablet (10 mg  total) by mouth 3 (three) times daily. 90 tablet 0  . HYDROcodone-acetaminophen (NORCO/VICODIN) 5-325 MG per tablet Take 1 tablet by mouth every 4 (four) hours as needed for moderate pain. 15 tablet 0  . hyoscyamine (LEVBID) 0.375 MG 12 hr tablet Take 1 tablet by mouth 2 (two) times daily.  1  . levothyroxine (SYNTHROID, LEVOTHROID) 50 MCG tablet Take 50 mcg by mouth daily before breakfast.    . pantoprazole (PROTONIX) 20 MG tablet Take 20 mg by mouth daily.   4  . Probiotic Product (PROBIOTIC-10 PO) Take by mouth daily.     No current facility-administered medications for this visit.     Review of Systems:  GENERAL:  Feels fatigued. No fevers.  Night sweats x 2 years.  Weight loss > 40 pounds in 18 months. PERFORMANCE STATUS (ECOG):  1 HEENT:  No visual changes, runny nose, sore throat, mouth sores or tenderness. Lungs: No shortness of breath or cough.  No hemoptysis. Cardiac:  No chest pain, palpitations, orthopnea, or PND. GI:  Bloating. Nausea. Constipation; improved with stool softeners. No  vomiting, diarrhea, melena or hematochezia. Diffuse abdominal tenderness.  GU:  Vaginal bleeding and discharge. No urgency, frequency, dysuria, or hematuria. Musculoskeletal:  Chronic low back pain; sees pain management. No joint pain.  No muscle tenderness. Extremities:  No pain or swelling. Skin:  No rashes or skin changes. Neuro:  No headache, numbness or weakness, balance or coordination issues. Endocrine: Night sweats x 2 years. No diabetes, thyroid issues, or hot flashes. Psych: Depression and anxiety related to decline in health.  Pain:  No focal pain. Review of systems:  All other systems reviewed and found to be negative.  Physical Exam: Blood pressure 101/67, pulse 79, resp. rate 18, height 4\' 11"  (1.499 m), weight 83 lb (37.6 kg). GENERAL: Thin woman sitting comfortably in the exam room in no acute distress. MENTAL STATUS:  Alert and oriented to person, place and time. HEAD:   Brown  hair.  Normocephalic, atraumatic, face symmetric, no Cushingoid features. EYES:  Brown eyes.  Pupils equal round and reactive to light and accomodation.  No conjunctivitis or scleral icterus. ENT:  Oropharynx clear without lesion.  Tongue normal. Mucous membranes moist.  RESPIRATORY:  Clear to auscultation without rales, wheezes or rhonchi. CARDIOVASCULAR:  Regular rate and rhythm without murmur, rub or gallop. ABDOMEN:  Scaphoid abdomen.  Soft, minimally tender without guarding or rebound tenderness.  Active bowel sounds, and no hepatosplenomegaly.  No masses. SKIN:  No rashes, ulcers or lesions. EXTREMITIES: No edema, no skin discoloration or tenderness.  No palpable cords. LYMPH NODES:  No palpable cervical, supraclavicular, axillary or inguinal adenopathy  NEUROLOGICAL: Unremarkable. PSYCH:  Appropriate.   No visits with results within 3 Day(s) from this visit.  Latest known visit with results is:  Admission on 05/14/2016, Discharged on 05/14/2016  Component Date Value Ref Range Status  . Specimen Description 05/14/2016 BRONCHIAL ALVEOLAR LAVAGE   Final  . Special Requests 05/14/2016 Normal   Final  . Gram Stain 05/14/2016    Final                   Value:MODERATE WBC PRESENT, PREDOMINANTLY PMN FEW SQUAMOUS EPITHELIAL CELLS PRESENT FEW GRAM POSITIVE COCCI IN CHAINS FEW GRAM POSITIVE RODS RARE GRAM NEGATIVE COCCOBACILLI   . Culture 05/14/2016 60,000 COLONIES/mL Consistent with normal respiratory flora.*  Final  . Report Status 05/14/2016 05/17/2016 FINAL   Final  . Fungus Stain 05/14/2016 Final report   Final  . Fungus (Mycology) Culture 05/14/2016 Final report   Final   Comment: (NOTE) Performed At: [facility] [place], [place] [id number] Lindon Romp MD [id number]   . Fungal Source 05/14/2016 BRONCHIAL ALVEOLAR LAVAGE   Final  . AFB Specimen Processing 05/14/2016 Concentration   Final  . Acid Fast Smear 05/14/2016 Negative   Final   Comment:  (NOTE) Performed At: [facility] [place], [place] [id number] Lindon Romp MD DJ:[id number]   . Source (AFB) 05/14/2016 BRONCHIAL ALVEOLAR LAVAGE   Final  . Acid Fast Culture 05/14/2016 Negative   Final   Comment: (NOTE) No acid fast bacilli isolated after 6 weeks. Performed At: [facility] [place], [place] [id number] Lindon Romp MD OI:[id number]   . Source of Sample 05/14/2016 BRONCHIAL ALVEOLAR LAVAGE   Final  . Fluid Type-FCT 05/14/2016 BRONCHIAL ALVEOLAR LAVAGE   Final  . Color, Fluid 05/14/2016 PINK* YELLOW Final  . Appearance, Fluid 05/14/2016 CLOUDY* CLEAR Final  . WBC, Fluid 05/14/2016 73  0 - 1,000 cu mm Final   CLUMPS OF CELLS PRESENT  . Neutrophil Count, Fluid 05/14/2016 16  0 - 25 % Final  . Lymphs, Fluid 05/14/2016 4  % Final  . Monocyte-Macrophage-Serous Fluid 05/14/2016 80  50 - 90 % Final  . Other Cells, Fluid 05/14/2016 PIGMENT LADEN MACROPHAGES  % Final  . Result 1 05/14/2016 Comment   Final   Comment: (NOTE) KOH/Calcofluor preparation:  no fungus observed. Performed At: [facility] [place], [place] [id number] Lindon Romp MD QH:[id number]   . Fungal result 1 05/14/2016 Comment   Final   Comment: (NOTE) No yeast or mold isolated after 4 weeks. Performed At: [facility] [place], [place] [id number] Lindon Romp MD TZ:[id number]     Assessment:  [name] is a 57 y.o. female with a 2 year history of weight loss and an elevated CEA.  She has a > 50-pack-year smoking history.  She has a history of heavy alcohol use > 5 years ago.  She has macrocytic RBC indices.  CEA has been followed: 6.6 in 2017, 13.87 on 01/21/2016, 12.61 on 04/08/2016, and 11.4 on 07/14/2017.  Work-up in 2017 included the following negative studies: hepatitis, HIV testing TSH and celiac testing were negative.  SPEP was negative in 01/2017.  Work-up in 07/2017 revealed the  following normal studies: TSH, ANA, RPR, C-reactive protein.  Calcium and creatinine were elevated.  Chest, abdomen, and pelvic CT on 01/27/2016 revealed multiple ill-defined pulmonary nodules up to 8 mm.  There  was no mediastinal adenopathy.  There was no mass or adenopathy in the abdomen or pelvis.  PET scan on 02/06/2016 revealed ground glass pulmonary nodules up to 8 mm (max SUV 1.2), possibly inflammatory or neoplastic.  Chest CT on 04/08/2016 revealed predominant upper lobe areas of peribronchial peribronchovascular nodularity.  Many nodules had resolved and there were several new nodules.  Findings were most likely related to an inflammatory or infectious process.  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   Bronchoscopy on 05/14/2016 revealed no endobronchial lesions.  Epiglottis biopsy revealed squamous papilloma.  Weight eventually improved to 103 pounds.  CXR on 07/18/2017 revealed clear lungs.  Abdomen and pelvic CT on 08/03/2017 revealed no CT evidence for acute intra-abdominal or pelvic abnormality.   EGD on 01/07/2012 revealed a normal esophagus and duodenum.  There was a small antral gastric ulcer.  Colonoscopy on 01/09/2012 revealed diffuse diverticulosis, medium size internal hemorrhoids, and a small polyp s/p snare cautery (no path obtained).  She describes one episode of vaginal bleeding when she got out of the shower which covered a towel.  She is s/p bilateral oophorectomy, but is unsure if her uterus remains.  Mammogram on 07/07/2015 revealed no evidence of malignancy.  Symptomatically, she has a  6-8 month history of abdominal bloating, nausea and progressive weight loss.  She has lost > 40 pounds in the past 18 months.  Exam reveals no acute findings.  Plan: 1.  Discuss constellation of symptoms, predominantly GI in origin + weight loss.  Discuss elevated CEA.  Discuss referral to GI for endoscopies. 2.  Discuss history of smoking and prior imaging with  multiple pulmonary nodules.  Discuss chest CT without contrast. 3.  Schedule bilateral mammogram (late). 4.  Discuss elevated calcium and initial work-up (PTH, PTH-rp, myeloma panel, FLCA, 24 hour urine).  5.  Discuss macrocytic indices with normal TSH.  She has a distant history of alcohol use.  She has no known liver disease.  Check CBC with diff, CMP, free T4, B12, folate, retic. 6.  RTC in 1 week for for MD assessment and review of workup.    Honor Loh, NP  08/15/2017, 12:16 PM   I saw and evaluated the patient, participating in the key portions of the service and reviewing pertinent diagnostic studies and records.  I reviewed the nurse practitioner's note and agree with the findings and the plan.  The assessment and plan were discussed with the patient.  Several questions were asked by the patient and answered.   Nolon Stalls, MD 08/15/2017, 12:16 PM

## 2017-08-15 NOTE — Progress Notes (Signed)
Met with [name] during and after consult with Dr. Mike Gip. Introduced Therapist, nutritional and provided contact information for future questions/needs. Escorted to the scheduling desk for appointments. Oncology Nurse Navigator Documentation  Navigator Location: [facility]-Med Onc (08/15/17 1300) Referral date to RadOnc/MedOnc: 08/12/17 (08/15/17 1300) )Navigator Encounter Type: Initial MedOnc (08/15/17 1300)                     Patient Visit Type: MedOnc;Initial (08/15/17 1300)   Barriers/Navigation Needs: Education (08/15/17 1300) Education: Other (08/15/17 1300) Interventions: Education (08/15/17 1300)     Education Method: Verbal (08/15/17 1300)      Acuity: Level 2 (08/15/17 1300)         Time Spent with Patient: 60 (08/15/17 1300)

## 2017-08-15 NOTE — Progress Notes (Signed)
Patient here today as new evaluation regarding elevated tumor markers. Referred by Dr. Sandrea Hammond  Patient states she lives in an older home with her boyfriend and states the back bedroom has a mold problem.  She states she keeps the room closed off and covers her mouth when she has to go in the room.  She states she is losing 1/2 - 1 lb per day.  Appetite is very slight - states she nibbles throughout the day.  A year and a half ago she weighted 125#  - today she is down to 83#.  She denies cough or SOB.  Patient seeing GYN on Wednesday for vaginal bleeding.

## 2017-08-16 ENCOUNTER — Encounter: Payer: Self-pay | Admitting: Hematology and Oncology

## 2017-08-16 LAB — KAPPA/LAMBDA LIGHT CHAINS
Kappa free light chain: 23.2 mg/L — ABNORMAL HIGH (ref 3.3–19.4)
Kappa, lambda light chain ratio: 1.19 (ref 0.26–1.65)
Lambda free light chains: 19.5 mg/L (ref 5.7–26.3)

## 2017-08-16 LAB — PTH, INTACT AND CALCIUM
CALCIUM TOTAL (PTH): 10.8 mg/dL — AB (ref 8.7–10.2)
PTH: 17 pg/mL (ref 15–65)

## 2017-08-17 ENCOUNTER — Ambulatory Visit: Payer: BLUE CROSS/BLUE SHIELD | Admitting: Obstetrics and Gynecology

## 2017-08-17 ENCOUNTER — Encounter: Payer: Self-pay | Admitting: Obstetrics and Gynecology

## 2017-08-17 VITALS — BP 115/77 | HR 97 | Ht 59.0 in | Wt 81.4 lb

## 2017-08-17 DIAGNOSIS — R634 Abnormal weight loss: Secondary | ICD-10-CM | POA: Diagnosis not present

## 2017-08-17 DIAGNOSIS — B3731 Acute candidiasis of vulva and vagina: Secondary | ICD-10-CM

## 2017-08-17 DIAGNOSIS — N950 Postmenopausal bleeding: Secondary | ICD-10-CM | POA: Diagnosis not present

## 2017-08-17 DIAGNOSIS — B373 Candidiasis of vulva and vagina: Secondary | ICD-10-CM

## 2017-08-17 DIAGNOSIS — N952 Postmenopausal atrophic vaginitis: Secondary | ICD-10-CM

## 2017-08-17 MED ORDER — FLUCONAZOLE 150 MG PO TABS
150.0000 mg | ORAL_TABLET | Freq: Once | ORAL | 0 refills | Status: AC
Start: 2017-08-17 — End: 2017-08-17

## 2017-08-17 NOTE — Progress Notes (Signed)
Pt about 3 wks ago noticed blood/bleeding in vaginal area after taking her bath happen once. Also notice discharge about 2 weeks ago unsure if it has an odor.

## 2017-08-17 NOTE — Progress Notes (Signed)
HPI:      [name] is a 57 y.o. G1P0 who LMP was No LMP recorded. Patient has had a hysterectomy.  Subjective:   She presents today after having one episode of vaginal bleeding in menopause.  Patient states that she has been in surgical menopause for more than 10 years.  She is unsure whether she has had a previous hysterectomy.  She was told that she no longer needed Pap smears.  She is not taking any type of hormone replacement. She also complains of intermittent vaginal discharge occasionally with itching. She is in the middle of a workup for weight loss because there is apparently a significant concern for cancer of unknown source. Patient stopped smoking 1 year ago.    Hx: The following portions of the patient's history were reviewed and updated as appropriate:             She  has a past medical history of Arthritis, Chronic kidney disease, Diverticulitis, GERD (gastroesophageal reflux disease), Hypertension, and Hypothyroidism. She does not have any pertinent problems on file. She  has a past surgical history that includes Abdominal hysterectomy; Cholecystectomy; Appendectomy; Esophagogastroduodenoscopy (01/07/2012); Colonoscopy (01/09/2012); and Video bronchoscopy (Bilateral, 05/14/2016). Her family history includes Cancer in her mother. She  reports that she has been smoking cigarettes.  She has a 84.00 pack-year smoking history. she has never used smokeless tobacco. She reports that she drinks about 21.0 oz of alcohol per week. She reports that she does not use drugs. She has a current medication list which includes the following prescription(s): alprazolam, atenolol, biotin, vitamin d-3, b-12, cyclobenzaprine, hydralazine, hydrocodone-acetaminophen, hyoscyamine, levothyroxine, pantoprazole, probiotic product, and fluconazole. She has No Known Allergies.       Review of Systems:  Review of Systems  Constitutional: Denied constitutional symptoms, night sweats, recent illness,  fatigue, fever, insomnia and weight loss.  Eyes: Denied eye symptoms, eye pain, photophobia, vision change and visual disturbance.  Ears/Nose/Throat/Neck: Denied ear, nose, throat or neck symptoms, hearing loss, nasal discharge, sinus congestion and sore throat.  Cardiovascular: Denied cardiovascular symptoms, arrhythmia, chest pain/pressure, edema, exercise intolerance, orthopnea and palpitations.  Respiratory: Denied pulmonary symptoms, asthma, pleuritic pain, productive sputum, cough, dyspnea and wheezing.  Gastrointestinal: Denied, gastro-esophageal reflux, melena, nausea and vomiting.  Genitourinary: See HPI for additional information.  Musculoskeletal: Denied musculoskeletal symptoms, stiffness, swelling, muscle weakness and myalgia.  Dermatologic: Denied dermatology symptoms, rash and scar.  Neurologic: Denied neurology symptoms, dizziness, headache, neck pain and syncope.  Psychiatric: Denied psychiatric symptoms, anxiety and depression.  Endocrine: Denied endocrine symptoms including hot flashes and night sweats.   Meds:   Current Outpatient Medications on File Prior to Visit  Medication Sig Dispense Refill  . ALPRAZolam (XANAX) 0.5 MG tablet Take 1 tablet by mouth 3 (three) times daily.  2  . atenolol (TENORMIN) 50 MG tablet Take 50 mg by mouth daily.    . Biotin 5000 MCG TABS Take by mouth daily.    . Cholecalciferol (VITAMIN D-3) 5000 UNITS TABS Take 10,000 Units by mouth daily after breakfast.    . Cyanocobalamin (B-12) 5000 MCG CAPS Take by mouth daily.    . cyclobenzaprine (FLEXERIL) 10 MG tablet Take 1 tablet by mouth 3 (three) times daily.  1  . hydrALAZINE (APRESOLINE) 10 MG tablet Take 1 tablet (10 mg total) by mouth 3 (three) times daily. 90 tablet 0  . HYDROcodone-acetaminophen (NORCO/VICODIN) 5-325 MG per tablet Take 1 tablet by mouth every 4 (four) hours as needed for moderate pain.  15 tablet 0  . hyoscyamine (LEVBID) 0.375 MG 12 hr tablet Take 1 tablet by mouth 2  (two) times daily.  1  . levothyroxine (SYNTHROID, LEVOTHROID) 50 MCG tablet Take 50 mcg by mouth daily before breakfast.    . pantoprazole (PROTONIX) 20 MG tablet Take 20 mg by mouth daily.   4  . Probiotic Product (PROBIOTIC-10 PO) Take by mouth daily.     No current facility-administered medications on file prior to visit.     Objective:     Vitals:   08/17/17 1103  BP: 115/77  Pulse: 97              Physical examination   Pelvic:   Vulva: Normal appearance.  No lesions.  Vagina: No lesions or abnormalities noted.  Atrophic  -increased thick white vaginal discharge consistent with monilia vulvovaginitis.  Support: Normal pelvic support.  Urethra No masses tenderness or scarring.  Meatus Normal size without lesions or prolapse.  Cervix:  surgically absent  Anus: Normal exam.  No lesions.  Perineum: Normal exam.  No lesions.        Bimanual   Uterus:  Surgically absent  Adnexae: No masses.  Non-tender to palpation.  Cul-de-sac: Negative for abnormality.     Assessment:    G1P0 Patient Active Problem List   Diagnosis Date Noted  . Weight loss 08/15/2017  . Elevated CEA 08/15/2017  . Hypercalcemia 08/15/2017  . Macrocytosis without anemia 08/15/2017  . Lesion of epiglottis   . Lung nodules 05/07/2016  . COPD (chronic obstructive pulmonary disease) ([place]) 05/07/2016  . Colitis 07/16/2013  . Acute renal failure ([place]) 07/16/2013  . HTN (hypertension) 07/16/2013  . Acute diverticulitis 07/16/2013  . Protein-calorie malnutrition, severe ([place]) 01/12/2013  . GIB (gastrointestinal bleeding) 01/07/2012  . Leukocytosis 01/07/2012  . Alcohol abuse 01/07/2012     1. Postmenopausal bleeding   2. Monilial vulvovaginitis   3. Atrophic vaginitis     Since she has apparently had a previous hysterectomy and oophorectomy she has very little risk for gynecologic cancer.  It is very likely that her single episode of vaginal bleeding is from hypoestrogenic vaginal  atrophy.  Monilia is also present.  The weight loss and other symptoms the patient is experiencing should take precedence at this time and her workup for possible malignancy should continue.   Plan:            1.  Will treat monilia vulvovaginitis  2.  I reassured her regarding the low risk of endometrial and cervical cancer in a woman without a uterus.  3.  Recommend continued workup for weight loss and possible malignancy Orders No orders of the defined types were placed in this encounter.    Meds ordered this encounter  Medications  . fluconazole (DIFLUCAN) 150 MG tablet    Sig: Take 1 tablet (150 mg total) by mouth once for 1 dose.    Dispense:  1 tablet    Refill:  0      F/U  No Follow-up on file.  Finis Bud, M.D. 08/17/2017 11:43 AM

## 2017-08-18 ENCOUNTER — Other Ambulatory Visit
Admission: RE | Admit: 2017-08-18 | Discharge: 2017-08-18 | Disposition: A | Discharge status: Home / Self Care | Payer: BLUE CROSS/BLUE SHIELD | Source: Ambulatory Visit | Attending: Gastroenterology | Admitting: Gastroenterology

## 2017-08-18 ENCOUNTER — Ambulatory Visit
Admission: RE | Admit: 2017-08-18 | Discharge: 2017-08-18 | Disposition: A | Discharge status: Home / Self Care | Payer: BLUE CROSS/BLUE SHIELD | Source: Ambulatory Visit | Attending: Urgent Care | Admitting: Urgent Care

## 2017-08-18 ENCOUNTER — Other Ambulatory Visit: Payer: Self-pay

## 2017-08-18 ENCOUNTER — Ambulatory Visit: Payer: BLUE CROSS/BLUE SHIELD | Admitting: Gastroenterology

## 2017-08-18 ENCOUNTER — Encounter: Payer: Self-pay | Admitting: Gastroenterology

## 2017-08-18 VITALS — BP 79/46 | HR 76 | Temp 97.7°F | Ht 59.0 in | Wt 82.6 lb

## 2017-08-18 DIAGNOSIS — I2510 Atherosclerotic heart disease of native coronary artery without angina pectoris: Secondary | ICD-10-CM | POA: Diagnosis not present

## 2017-08-18 DIAGNOSIS — R918 Other nonspecific abnormal finding of lung field: Secondary | ICD-10-CM | POA: Insufficient documentation

## 2017-08-18 DIAGNOSIS — R634 Abnormal weight loss: Secondary | ICD-10-CM

## 2017-08-18 DIAGNOSIS — J439 Emphysema, unspecified: Secondary | ICD-10-CM | POA: Diagnosis not present

## 2017-08-18 DIAGNOSIS — R970 Elevated carcinoembryonic antigen [CEA]: Secondary | ICD-10-CM | POA: Diagnosis present

## 2017-08-18 DIAGNOSIS — D7589 Other specified diseases of blood and blood-forming organs: Secondary | ICD-10-CM

## 2017-08-18 DIAGNOSIS — I700 Atherosclerosis of aorta: Secondary | ICD-10-CM | POA: Insufficient documentation

## 2017-08-18 LAB — MULTIPLE MYELOMA PANEL, SERUM
ALBUMIN SERPL ELPH-MCNC: 4.1 g/dL (ref 2.9–4.4)
ALPHA2 GLOB SERPL ELPH-MCNC: 0.7 g/dL (ref 0.4–1.0)
Albumin/Glob SerPl: 1.5 (ref 0.7–1.7)
Alpha 1: 0.2 g/dL (ref 0.0–0.4)
B-GLOBULIN SERPL ELPH-MCNC: 1 g/dL (ref 0.7–1.3)
GAMMA GLOB SERPL ELPH-MCNC: 0.9 g/dL (ref 0.4–1.8)
GLOBULIN, TOTAL: 2.8 g/dL (ref 2.2–3.9)
IGG (IMMUNOGLOBIN G), SERUM: 768 mg/dL (ref 700–1600)
IgA: 180 mg/dL (ref 87–352)
IgM (Immunoglobulin M), Srm: 102 mg/dL (ref 26–217)
Total Protein ELP: 6.9 g/dL (ref 6.0–8.5)

## 2017-08-18 LAB — SEDIMENTATION RATE: Sed Rate: 2 mm/hr (ref 0–30)

## 2017-08-18 LAB — C-REACTIVE PROTEIN: CRP: <0.8 mg/dL (ref <1.0)

## 2017-08-18 LAB — TSH: TSH: 4.252 u[IU]/mL (ref 0.350–4.500)

## 2017-08-18 MED ORDER — PEG 3350-KCL-NA BICARB-NACL 420 G PO SOLR: 4000.0000 mL | Freq: Once | ORAL | 0 refills | Status: AC

## 2017-08-18 NOTE — Progress Notes (Addendum)
Jonathon Bellows MD, MRCP(U.K) [address]  [place]  [place], [place] [address]  Main: [phone]  Fax: [phone]   Gastroenterology Consultation  Referring Provider:     Alvester Chou, NP Primary Care Physician:  Alvester Chou, NP Primary Gastroenterologist:  Dr. Jonathon Bellows  Reason for Consultation:     Weight loss        HPI:   [name] is a 57 y.o. y/o female referred for consultation & management  by Dr. Alvester Chou, NP.     She has been referred by Dr Mike Gip for weight loss. .Over 50 lbs lost since 2017 . 50 pack year of smoking . PET scan in 2017 showed lung nodules. Elevated CEA Negative TTG antibody for celiac disease  In 01/2017 .HIV,RPR negative. CT scan of the abdomen 07/2017- no gross abnormalities. CT scan of the chest performed today shows no new pulmonary lesions . On b12 supplements. Mother had multiple cancers. Albumin 4.5:  08/2017 .    Weighed 92 lbs in 01/2016  Today weighs   Occasional abdominal - 3 times a day , each episode lasts around 10 minutes, middle of the abdomen , goes to her back. She sees a pain doctor for her back, takes hydrocodone . Apetite is good - has dinner, not much for breakfast or lunch and she does not eat as she " has no time to eat ", says she does feel hungry when she has no time. Does not drink any alcohol. No weight loss supplements, ocasional laxative use. No rectal bleeding .   Takes Bc powder daily.      There were no vitals taken for this visit.  Past Medical History:  Diagnosis Date  . Arthritis    hands, back, knees  . Chronic kidney disease    stones  . Diverticulitis   . GERD (gastroesophageal reflux disease)   . Hypertension    somewhat controlled, taking medication  . Hypothyroidism     Past Surgical History:  Procedure Laterality Date  . ABDOMINAL HYSTERECTOMY    . APPENDECTOMY    . CHOLECYSTECTOMY    . COLONOSCOPY  01/09/2012   Procedure: COLONOSCOPY;  Surgeon: Lear Ng, MD;   Location: [facility] ENDOSCOPY;  Service: Endoscopy;  Laterality: N/A;  . ESOPHAGOGASTRODUODENOSCOPY  01/07/2012   Procedure: ESOPHAGOGASTRODUODENOSCOPY (EGD);  Surgeon: Winfield Cunas., MD;  Location: [facility] ENDOSCOPY;  Service: Endoscopy;  Laterality: N/A;  . VIDEO BRONCHOSCOPY Bilateral 05/14/2016   Procedure: VIDEO BRONCHOSCOPY WITHOUT FLUORO;  Surgeon: Collene Gobble, MD;  Location: [facility];  Service: Cardiopulmonary;  Laterality: Bilateral;    Prior to Admission medications   Medication Sig Start Date End Date Taking? Authorizing Provider  ALPRAZolam Duanne Moron) 0.5 MG tablet Take 1 tablet by mouth 3 (three) times daily. 08/08/17   [provider]  atenolol (TENORMIN) 50 MG tablet Take 50 mg by mouth daily.    Danise Mina, Medical Student  Biotin 5000 MCG TABS Take by mouth daily.    [provider]  Cholecalciferol (VITAMIN D-3) 5000 UNITS TABS Take 10,000 Units by mouth daily after breakfast.    [provider]  Cyanocobalamin (B-12) 5000 MCG CAPS Take by mouth daily.    [provider]  cyclobenzaprine (FLEXERIL) 10 MG tablet Take 1 tablet by mouth 3 (three) times daily. 08/08/17   [provider]  fluconazole (DIFLUCAN) 150 MG tablet  08/17/17   [provider]  hydrALAZINE (APRESOLINE) 10 MG tablet Take 1 tablet (10  mg total) by mouth 3 (three) times daily. 07/20/13   Allie Bossier, MD  HYDROcodone-acetaminophen (NORCO/VICODIN) 5-325 MG per tablet Take 1 tablet by mouth every 4 (four) hours as needed for moderate pain. 12/18/14   Jola Schmidt, MD  hyoscyamine (LEVBID) 0.375 MG 12 hr tablet Take 1 tablet by mouth 2 (two) times daily. 06/26/17   [provider]  levothyroxine (SYNTHROID, LEVOTHROID) 50 MCG tablet Take 50 mcg by mouth daily before breakfast.    [provider]  pantoprazole (PROTONIX) 20 MG tablet Take 20 mg by mouth daily.  02/16/16   [provider]  Probiotic Product (PROBIOTIC-10 PO) Take by mouth  daily.    [provider]    Family History  Problem Relation Age of Onset  . Cancer Mother      Social History   Tobacco Use  . Smoking status: Current Some Day Smoker    Packs/day: 2.00    Years: 42.00    Pack years: 84.00    Types: Cigarettes    Last attempt to quit: 07/07/2015    Years since quitting: 2.1  . Smokeless tobacco: Never Used  . Tobacco comment: Patient states she smoke 2 cigarettes a day  Substance Use Topics  . Alcohol use: Yes    Alcohol/week: 21.0 oz    Types: 28 Shots of liquor, 7 Glasses of wine per week    Comment: 01/10/13 - quit 6 to 7 months ago, prior heavy use  . Drug use: No    Allergies as of 08/18/2017  . (No Known Allergies)    Review of Systems:    All systems reviewed and negative except where noted in HPI.   Physical Exam:  There were no vitals taken for this visit. No LMP recorded. Patient has had a hysterectomy. Psych:  Alert and cooperative. Normal mood and affect. General:   Alert,  Very thin , pleasant and cooperative in NAD Head:  Normocephalic and atraumatic. Eyes:  Sclera clear, no icterus.   Conjunctiva pink. Ears:  Normal auditory acuity. Nose:  No deformity, discharge, or lesions. Mouth:  No deformity or lesions,oropharynx pink & moist. Neck:  Supple; no masses or thyromegaly. Lungs:  Respirations even and unlabored.  Clear throughout to auscultation.   No wheezes, crackles, or rhonchi. No acute distress. Heart:  Regular rate and rhythm; no murmurs, clicks, rubs, or gallops. Abdomen:  Normal bowel sounds.  No bruits.  Soft, non-tender and non-distended without masses, hepatosplenomegaly or hernias noted.  No guarding or rebound tenderness.     Neurologic:  Alert and oriented x3;  grossly normal neurologically. Skin:  Intact without significant lesions or rashes. No jaundice. Lymph Nodes:  No significant cervical adenopathy. Psych:  Alert and cooperative. Normal mood and affect.  Imaging Studies: Ct Chest Wo  Contrast  Result Date: 08/18/2017 CLINICAL DATA:  Severe weight loss over the past 6-7 months. EXAM: CT CHEST WITHOUT CONTRAST TECHNIQUE: Multidetector CT imaging of the chest was performed following the standard protocol without IV contrast. COMPARISON:  Chest CT 04/08/2016 and PET-CT 02/06/2016 FINDINGS: Cardiovascular: The heart is normal in size. No pericardial effusion. There is tortuosity, ectasia and atherosclerotic calcifications involving the thoracic aorta. No focal aneurysm. Three-vessel coronary artery calcifications are noted. Mediastinum/Nodes: No mediastinal or hilar mass or lymphadenopathy. Small scattered lymph nodes are noted. The esophagus is grossly normal. Lungs/Pleura: Stable emphysematous changes. No worrisome pulmonary nodules or acute pulmonary findings. The patchy ground-glass opacities seen on the prior study have resolved and were  most likely inflammatory or infectious. No interstitial lung disease or bronchiectasis. Upper Abdomen: No significant upper abdominal findings. Musculoskeletal: No significant bony findings. No chest wall mass, breast mass, supraclavicular or axillary lymphadenopathy. IMPRESSION: 1. No new or worrisome pulmonary lesions. The small ground-glass opacities have resolved and were likely inflammatory or infectious. 2. Stable emphysematous changes. No acute overlying pulmonary process. 3. No mediastinal or hilar mass or adenopathy. 4. Stable atherosclerotic calcifications involving the aorta and coronary arteries. Aortic Atherosclerosis (ICD10-I70.0) and Emphysema (ICD10-J43.9). Electronically Signed   By: Marijo Sanes M.D.   On: 08/18/2017 12:09   Ct Abdomen Pelvis W Contrast  Result Date: 08/03/2017 CLINICAL DATA:  Weight loss 6 months EXAM: CT ABDOMEN AND PELVIS WITH CONTRAST TECHNIQUE: Multidetector CT imaging of the abdomen and pelvis was performed using the standard protocol following bolus administration of intravenous contrast. CONTRAST:  67m  ISOVUE-300 IOPAMIDOL (ISOVUE-300) INJECTION 61% COMPARISON:  PET CT 02/06/2016, CT abdomen 01/27/2016, 12/18/2014 FINDINGS: Lower chest: Lung bases demonstrate no acute consolidation or effusion. Heart size within normal limits. Hepatobiliary: No focal hepatic abnormality. Surgical clips in the gallbladder fossa. Stable enlarged extrahepatic bile duct. Pancreas: Unremarkable. No pancreatic ductal dilatation or surrounding inflammatory changes. Spleen: Normal in size without focal abnormality. Adrenals/Urinary Tract: Adrenal glands are within normal limits. Stable cyst mid right kidney. Bladder unremarkable Stomach/Bowel: Stomach is within normal limits. Appendix not well seen but no right lower quadrant inflammation. Sigmoid colon diverticula disease without definitive inflammation no evidence of bowel wall thickening, distention, or inflammatory changes. Vascular/Lymphatic: Moderate-to-marked aortic atherosclerosis. No aneurysmal dilatation. No significantly enlarged lymph nodes Reproductive: Status post hysterectomy. No adnexal masses. Other: Negative for free air or free fluid Musculoskeletal: Degenerative changes. No acute or suspicious lesion. IMPRESSION: 1. No CT evidence for acute intra-abdominal or pelvic abnormality 2. Enlarged extrahepatic bile duct post cholecystectomy, no change in likely due to postsurgical dilatation 3. Cyst in the right kidney demonstrates slight increased density values but is stable in size compared to multiple prior exams. Electronically Signed   By: KDonavan FoilM.D.   On: 08/03/2017 20:07    Assessment and Plan:   JLATERRA LUBINSKIis a 57y.o. y/o female has been referred for unintentional weight loss. Issues have been ongoing since 2017 and in the past cancer markers were checked for some reason and have been elevated. Presently note elevated T4 , I will proceed with GI evaluation with endoscopy .Lont term use of BC powder.  I feel she has not been eating well although  having a good apetite which she blames to a lack of time. I discussed to maintain a food diary , we discussed meal plans . Abdominal pain likely from long term use of BC powder.   Plan  1. EGD+colonoscopy  2. Elevated free T4 - to be addressed by her primary care physician . No recent TSH- I will order 3. Stool H pylori antigen  4. Check CRP  ,ESR- if elevated can look into connective tissue disorders/vasculitis as a cause of weight loss.  5. Ensure TID 6. IF above negative can consider EUS to evaluate dilated extrahepatic bile duct which can be seen post surgically but also in conditions such an an ampullary neoplasm although less likely as she normal LFT's and weight loss has been gradual over a long time than acute.  7. Should also consider anxiety as a cause of weight loss and evaluation may need to be considered over due course .  8. Repeat complete celiac panel and  HIV.  9. Stop BC powder 10. Not to skip breakfast and lunch due to lack of time. Return to my office for a nurse visit in 2 weeks with food diary and recheck of weight   I have discussed alternative options, risks & benefits,  which include, but are not limited to, bleeding, infection, perforation,respiratory complication & drug reaction.  The patient agrees with this plan & written consent will be obtained.    Follow up in 6 weeks   Dr Jonathon Bellows MD,MRCP(U.K)

## 2017-08-18 NOTE — Addendum Note (Signed)
Addended by: Peggye Ley on: 08/18/2017 03:57 PM   Modules accepted: Orders, SmartSet

## 2017-08-19 LAB — IFE+PROTEIN ELECTRO, 24-HR UR
% BETA, Urine: 0 %
ALBUMIN, U: 100 %
ALPHA 1 URINE: 0 %
Alpha 2, Urine: 0 %
GAMMA GLOBULIN URINE: 0 %
TOTAL PROTEIN, URINE-UPE24: 5.1 mg/dL
TOTAL VOLUME: 1400
Total Protein, Urine-Ur/day: 71 mg/24 hr (ref 30–150)

## 2017-08-19 LAB — UIFE/LIGHT CHAINS/TP QN, 24-HR UR
FR KAPPA LT CH,24HR: 2 mg/(24.h)
FR LAMBDA LT CH,24HR: 10 mg/(24.h)
FREE KAPPA/LAMBDA RATIO: 0.15 — AB (ref 2.04–10.37)
Free Kappa Lt Chains,Ur: 1.1 mg/L — ABNORMAL LOW (ref 1.35–24.19)
Free Lambda Lt Chains,Ur: 7.48 mg/L — ABNORMAL HIGH (ref 0.24–6.66)
TOTAL VOLUME: 1400
Total Protein, Urine-Ur/day: 71 mg/24 hr (ref 30–150)
Total Protein, Urine: 5.1 mg/dL

## 2017-08-20 LAB — CELIAC DISEASE PANEL
ENDOMYSIAL ANTIBODY IGA: NEGATIVE
IgA: 178 mg/dL (ref 87–352)
Tissue Transglutaminase Ab, IgA: <2 U/mL (ref 0–3)

## 2017-08-21 ENCOUNTER — Encounter: Payer: Self-pay | Admitting: Gastroenterology

## 2017-08-22 ENCOUNTER — Telehealth: Payer: Self-pay

## 2017-08-22 ENCOUNTER — Inpatient Hospital Stay (HOSPITAL_BASED_OUTPATIENT_CLINIC_OR_DEPARTMENT_OTHER): Payer: BLUE CROSS/BLUE SHIELD | Admitting: Hematology and Oncology

## 2017-08-22 ENCOUNTER — Other Ambulatory Visit
Admission: RE | Admit: 2017-08-22 | Discharge: 2017-08-22 | Disposition: A | Discharge status: Home / Self Care | Payer: BLUE CROSS/BLUE SHIELD | Source: Ambulatory Visit | Attending: Gastroenterology | Admitting: Gastroenterology

## 2017-08-22 ENCOUNTER — Inpatient Hospital Stay: Payer: BLUE CROSS/BLUE SHIELD

## 2017-08-22 ENCOUNTER — Encounter: Payer: Self-pay | Admitting: Hematology and Oncology

## 2017-08-22 VITALS — BP 119/70 | HR 76 | Resp 18

## 2017-08-22 VITALS — BP 101/67 | HR 72 | Temp 97.9°F | Resp 18 | Ht 59.0 in | Wt 84.3 lb

## 2017-08-22 DIAGNOSIS — Z87442 Personal history of urinary calculi: Secondary | ICD-10-CM

## 2017-08-22 DIAGNOSIS — R918 Other nonspecific abnormal finding of lung field: Secondary | ICD-10-CM

## 2017-08-22 DIAGNOSIS — K529 Noninfective gastroenteritis and colitis, unspecified: Secondary | ICD-10-CM

## 2017-08-22 DIAGNOSIS — R634 Abnormal weight loss: Secondary | ICD-10-CM

## 2017-08-22 DIAGNOSIS — K219 Gastro-esophageal reflux disease without esophagitis: Secondary | ICD-10-CM | POA: Diagnosis not present

## 2017-08-22 DIAGNOSIS — I951 Orthostatic hypotension: Secondary | ICD-10-CM | POA: Insufficient documentation

## 2017-08-22 DIAGNOSIS — F17210 Nicotine dependence, cigarettes, uncomplicated: Secondary | ICD-10-CM

## 2017-08-22 DIAGNOSIS — E43 Unspecified severe protein-calorie malnutrition: Secondary | ICD-10-CM

## 2017-08-22 DIAGNOSIS — K5900 Constipation, unspecified: Secondary | ICD-10-CM

## 2017-08-22 DIAGNOSIS — Z801 Family history of malignant neoplasm of trachea, bronchus and lung: Secondary | ICD-10-CM

## 2017-08-22 DIAGNOSIS — I10 Essential (primary) hypertension: Secondary | ICD-10-CM | POA: Diagnosis not present

## 2017-08-22 DIAGNOSIS — R970 Elevated carcinoembryonic antigen [CEA]: Secondary | ICD-10-CM

## 2017-08-22 DIAGNOSIS — Z79899 Other long term (current) drug therapy: Secondary | ICD-10-CM

## 2017-08-22 DIAGNOSIS — J439 Emphysema, unspecified: Secondary | ICD-10-CM | POA: Diagnosis not present

## 2017-08-22 DIAGNOSIS — N939 Abnormal uterine and vaginal bleeding, unspecified: Secondary | ICD-10-CM

## 2017-08-22 DIAGNOSIS — M545 Low back pain: Secondary | ICD-10-CM | POA: Diagnosis not present

## 2017-08-22 DIAGNOSIS — Z800 Family history of malignant neoplasm of digestive organs: Secondary | ICD-10-CM

## 2017-08-22 DIAGNOSIS — Z803 Family history of malignant neoplasm of breast: Secondary | ICD-10-CM

## 2017-08-22 DIAGNOSIS — D7589 Other specified diseases of blood and blood-forming organs: Secondary | ICD-10-CM

## 2017-08-22 DIAGNOSIS — E039 Hypothyroidism, unspecified: Secondary | ICD-10-CM

## 2017-08-22 HISTORY — DX: Orthostatic hypotension: I95.1

## 2017-08-22 MED ORDER — SODIUM CHLORIDE 0.9 % IV SOLN
Freq: Once | INTRAVENOUS | Status: AC
  Administered 2017-08-22: 12:00:00 via INTRAVENOUS
  Filled 2017-08-22: qty 1000

## 2017-08-22 NOTE — Progress Notes (Signed)
Patient c/o constipation 1 b/m 1 week with increase pain ( 9)

## 2017-08-22 NOTE — Progress Notes (Signed)
[facility] day:  08/22/2017  Chief Complaint: [name] is a 57 y.o. female with elevated tumor markers and weight loss who is seen for review of work-up and discussion regarding direction of therapy.  HPI:  The patient was last seen on 08/15/2017 for initial consultation.   She described a 2 year history of weight loss and an elevated CEA.  She had a > 50-pack-year smoking history.  She had a history of heavy alcohol use > 5 years ago.  She had macrocytic RBC indices.  She described one episode of heavy vaginal bleeding.  Abdomen and pelvic CT on 08/03/2017 revealed no acute abnormalities.  Labs on 08/15/2017 revealed a hematocrit of 45.4, hemoglobin 15.8, MCV 107.3, platelets 221,000, WBC 8200 with an ANC of 5300.  Retic was 0.7%.  B12 was 3503 (high).  Folate was 20.4.  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  PTH-rp is pending.  Free T4 was 1.22 (0.61 -1.12).  Myeloma panel was normal.  Kappa free light chains were 23.2, lambda free light chains 19.5 with a ratio of 1.19 (normal).  She was referred to GI for endoscopies.  She was scheduled for chest CT and mammogram (late).    She was seen by Dr. Jonathon Bellows on 08/18/2017.  Celiac panel, TSH, sed rate, CRP were normal.  She is scheduled for EGD and colonoscopy.  Chest CT on 08/18/2017 revealed no new or worrisome pulmonary lesions. The small ground-glass opacities have resolved and were likely inflammatory or infectious.  There was stable emphysematous changes. There was no acute overlying pulmonary process.  There was no mediastinal or hilar mass or adenopathy.  There was stable atherosclerotic calcifications involving the aorta and coronary arteries.  Mammogram is scheduled for 09/14/2017.  Symptomatically, patient has been experiencing constipation since last Thursday. Patient states, "My stomach was hurting so bad that when I sat down to have a bowel movement, I  almost passed out this morning". Patient confirms that she was vertiginous. She is scheduled for an EGD and colonoscopy tomorrow with Dr. Vicente Males.    Patient has gained 2 pounds since her last visit. She notes that she has pain rated 9/10 today.    Past Medical History:  Diagnosis Date  . Arthritis    hands, back, knees  . Chronic kidney disease    stones  . Diverticulitis   . GERD (gastroesophageal reflux disease)   . Hypertension    somewhat controlled, taking medication  . Hypothyroidism     Past Surgical History:  Procedure Laterality Date  . ABDOMINAL HYSTERECTOMY    . APPENDECTOMY    . CHOLECYSTECTOMY    . COLONOSCOPY  01/09/2012   Procedure: COLONOSCOPY;  Surgeon: Lear Ng, MD;  Location: [facility] ENDOSCOPY;  Service: Endoscopy;  Laterality: N/A;  . ESOPHAGOGASTRODUODENOSCOPY  01/07/2012   Procedure: ESOPHAGOGASTRODUODENOSCOPY (EGD);  Surgeon: Winfield Cunas., MD;  Location: [facility] ENDOSCOPY;  Service: Endoscopy;  Laterality: N/A;  . VIDEO BRONCHOSCOPY Bilateral 05/14/2016   Procedure: VIDEO BRONCHOSCOPY WITHOUT FLUORO;  Surgeon: Collene Gobble, MD;  Location: [facility];  Service: Cardiopulmonary;  Laterality: Bilateral;    Family History  Problem Relation Age of Onset  . Cancer Mother     Social History:  reports that she has been smoking cigarettes.  She has a 84.00 pack-year smoking history. she has never used smokeless tobacco. She reports that she drinks about 21.0 oz of alcohol per week. She  reports that she does not use drugs. She previously smoked 2 packs/day x 42 years.  She cut back smoking 1 1/2 years ago.  She currently smokes 2 cigarettes/day.  Former heavy alcohol (beer) drinker; "I drank a case of beer every other day". Patient currently drinks a single glass of wine every other day. Patient is employed at [employer] as a Clinical research associate. Patient denies known exposures to radiation on toxins.  Lives with her boyfriend of 15 years. The patient is accompanied by by [name] (nurse navigator) today.  Allergies: No Known Allergies  Current Medications: Current Outpatient Medications  Medication Sig Dispense Refill  . ALPRAZolam (XANAX) 0.5 MG tablet Take 1 tablet by mouth 3 (three) times daily.  2  . atenolol (TENORMIN) 50 MG tablet Take 50 mg by mouth daily.    . Biotin 5000 MCG TABS Take by mouth daily.    . Cholecalciferol (VITAMIN D-3) 5000 UNITS TABS Take 10,000 Units by mouth daily after breakfast.    . Cyanocobalamin (B-12) 5000 MCG CAPS Take by mouth daily.    . cyclobenzaprine (FLEXERIL) 10 MG tablet Take 1 tablet by mouth 3 (three) times daily.  1  . hydrALAZINE (APRESOLINE) 10 MG tablet Take 1 tablet (10 mg total) by mouth 3 (three) times daily. 90 tablet 0  . HYDROcodone-acetaminophen (NORCO/VICODIN) 5-325 MG per tablet Take 1 tablet by mouth every 4 (four) hours as needed for moderate pain. 15 tablet 0  . hyoscyamine (LEVBID) 0.375 MG 12 hr tablet Take 1 tablet by mouth 2 (two) times daily.  1  . levothyroxine (SYNTHROID, LEVOTHROID) 50 MCG tablet Take 50 mcg by mouth daily before breakfast.    . pantoprazole (PROTONIX) 20 MG tablet Take 20 mg by mouth daily.   4  . Probiotic Product (PROBIOTIC-10 PO) Take by mouth daily.    . fluconazole (DIFLUCAN) 150 MG tablet   0   No current facility-administered medications for this visit.     Review of Systems:  GENERAL:  Fatigue. No fevers.  Night sweats x 2 years.  Weight loss > 40 pounds in 18 months. Weight up 2 pounds since last visit.  PERFORMANCE STATUS (ECOG):  1 HEENT:  No visual changes, runny nose, sore throat, mouth sores or tenderness. Lungs: No shortness of breath or cough.  No hemoptysis. Cardiac:  No chest pain, palpitations, orthopnea, or PND. GI:  Bloating. Nausea. Constipation; improved with stool softeners.  Abdominal pain.  No  vomiting, diarrhea, melena or hematochezia. Diffuse abdominal tenderness.  GU:  Vaginal bleeding and discharge. No urgency, frequency, dysuria, or  hematuria. Musculoskeletal:  Chronic low back pain; sees pain management. No joint pain.  No muscle tenderness. Extremities:  No pain or swelling. Skin:  No rashes or skin changes. Neuro:  No headache, numbness or weakness, balance or coordination issues. Endocrine: Night sweats x 2 years. No diabetes, thyroid issues, or hot flashes. Psych: Depression and anxiety related to decline in health.  Pain:  9/10 - back Review of systems:  All other systems reviewed and found to be negative.  Physical Exam: Blood pressure 108/73, pulse 98, temperature 97.9 F (36.6 C), resp. rate 18, height 4\' 11"  (1.499 m), weight 84 lb 4.8 oz (38.2 kg), SpO2 97 %. GENERAL: Thin woman sitting comfortably in the exam room in no acute distress. MENTAL STATUS:  Alert and oriented to person, place and time. HEAD:   Brown hair pulled back.  Normocephalic, atraumatic, face symmetric, no Cushingoid features. EYES:  Brown eyes.  Pupils equal round and reactive to light and accomodation.  No conjunctivitis or scleral icterus. ENT:  Oropharynx clear without lesion.  Tongue normal. Mucous membranes moist.  RESPIRATORY:  Clear to auscultation without rales, wheezes or rhonchi. CARDIOVASCULAR:  Regular rate and rhythm without murmur, rub or gallop. ABDOMEN:  Scaphoid abdomen.  Soft, minimally tender without guarding or rebound tenderness.  Active bowel sounds, and no hepatosplenomegaly.  No masses. SKIN:  No rashes, ulcers or lesions. EXTREMITIES: No edema, no skin discoloration or tenderness.  No palpable cords. LYMPH NODES: No palpable cervical, supraclavicular, axillary or inguinal adenopathy  NEUROLOGICAL: Unremarkable. PSYCH:  Appropriate.   No visits with results within 3 Day(s) from this visit.  Latest known visit with results is:  Hospital Outpatient Visit on 08/18/2017  Component Date Value Ref Range Status  . TSH 08/18/2017 4.252  0.350 - 4.500 uIU/mL Final   Comment: Performed by a 3rd Generation assay with a  functional sensitivity of <=0.01 uIU/mL. Performed at [facility], [address] [place], [place] [address]   . CRP 08/18/2017 <0.8  <1.0 mg/dL Final   Performed at [facility] [address], [place], [place] [address]  . Sed Rate 08/18/2017 2  0 - 30 mm/hr Final   Performed at [facility], [address] [place], [place] [address]  . Endomysial Ab, IgA 08/18/2017 Negative  Negative Final  . Tissue Transglutaminase Ab, IgA 08/18/2017 <2  0 - 3 U/mL Final   Comment: (NOTE)                              Negative        0 -  3                              Weak Positive   4 - 10                              Positive           >10 Tissue Transglutaminase (tTG) has been identified as the endomysial antigen.  Studies have demonstr- ated that endomysial IgA antibodies have over 99% specificity for gluten sensitive enteropathy.   . IgA 08/18/2017 178  87 - 352 mg/dL Final   Comment: (NOTE) Performed At: [facility] [place], [place] [id number] Rush Farmer MD HA:[id number] Performed at [facility], [address] [place], [place] [address]     Assessment:  KARLIN BINION is a 57 y.o. female with a 2 year history of weight loss and an elevated CEA.  She has a > 50-pack-year smoking history.  She has a history of heavy alcohol use > 5 years ago.  She has macrocytic RBC indices.  CEA has been followed: 6.6 in 2017, 13.87 on 01/21/2016, 12.61 on 04/08/2016, and 11.4 on 07/14/2017.  Work-up in 2017 included the following negative studies: hepatitis, HIV testing TSH and celiac testing were negative.  SPEP was negative in 01/2017.  Work-up in 07/2017 revealed the following normal studies: TSH, ANA, RPR, C-reactive protein.  Calcium and creatinine were elevated.  Chest, abdomen, and pelvic CT on 01/27/2016 revealed multiple ill-defined pulmonary nodules up to 8 mm.  There was no mediastinal adenopathy.  There was no mass or  adenopathy in the abdomen or pelvis.  PET scan  on 02/06/2016 revealed ground glass pulmonary nodules up to 8 mm (max SUV 1.2), possibly inflammatory or neoplastic.  Chest CT on 04/08/2016 revealed predominant upper lobe areas of peribronchial peribronchovascular nodularity.  Many nodules had resolved and there were several new nodules.  Findings were most likely related to an inflammatory or infectious process.  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   Bronchoscopy on 05/14/2016 revealed no endobronchial lesions.  Epiglottis biopsy revealed squamous papilloma.  Weight eventually improved to 103 pounds.  CXR on 07/18/2017 revealed clear lungs.  Abdomen and pelvic CT on 08/03/2017 revealed no CT evidence for acute intra-abdominal or pelvic abnormality.   EGD on 01/07/2012 revealed a normal esophagus and duodenum.  There was a small antral gastric ulcer.  Colonoscopy on 01/09/2012 revealed diffuse diverticulosis, medium size internal hemorrhoids, and a small polyp s/p snare cautery (no path obtained).  She describes one episode of vaginal bleeding when she got out of the shower which covered a towel.  She is s/p bilateral oophorectomy, but is unsure if her uterus remains.  Mammogram on 07/07/2015 revealed no evidence of malignancy.  Symptomatically, she has had abdominal discomfort and vertigo. She has a  6-8 month history of abdominal bloating, nausea and progressive weight loss.  She has lost > 40 pounds in the past 18 months.  Exam reveals no acute findings.  Plan: 1.  Review interval labs. Waiting on PTH-rp (2 week turn around per [facility]).  2.  Review interval chest CT- no abnormalities. 3.  Review GYN evaluation. GYN feels as if patient is at low risk of GYN related cancer.  4.  Follow-up scheduled EGD and colonoscopy on 08/23/2017. 5.  Bilateral mammogram scheduled for 09/14/2017. 6.  Discuss weight loss. Patient encouraged to increase protein and calorie intake. Discuss  referral to nutritionist; patient states, "not today".  7.  Discuss vertiginous symptoms and pre-syncopal episode earlier today. Vagal response vs. Dehydration. Patient doing bowel prep for colonoscopy tomorrow. (+) orthostatic changes; SBP 101 to 88 with position changes. Will give 1L NS today.    Contact GI regarding temporarily postponing endoscopic procedure and bowel prep. 8.  RTC after mammogram for  MD assessment and review of workup.    Honor Loh, NP  08/22/2017, 11:08 AM   I saw and evaluated the patient, participating in the key portions of the service and reviewing pertinent diagnostic studies and records.  I reviewed the nurse practitioner's note and agree with the findings and the plan.  The assessment and plan were discussed with the patient.  Several questions were asked by the patient and answered.   Nolon Stalls, MD 08/22/17, 11:21 AM

## 2017-08-22 NOTE — Telephone Encounter (Signed)
Patient was seen at Dr. Rober Minion office and given IVF for dehydration.   Per Dr. Vicente Males, moved patient's scope to Thursday. Dr. Vicente Males was unwilling to dehydrate the patient after becoming stable on IVF.   Left voicemail for patient callback to advise of procedure date change.

## 2017-08-23 LAB — PTH-RELATED PEPTIDE

## 2017-08-24 ENCOUNTER — Encounter: Payer: Self-pay | Admitting: Gastroenterology

## 2017-08-24 LAB — H. PYLORI ANTIGEN, STOOL: H. PYLORI STOOL AG, EIA: NEGATIVE

## 2017-08-25 ENCOUNTER — Ambulatory Visit: Payer: BLUE CROSS/BLUE SHIELD | Admitting: Anesthesiology

## 2017-08-25 ENCOUNTER — Encounter
Admission: RE | Disposition: A | Discharge status: Home / Self Care | Payer: Self-pay | Source: Ambulatory Visit | Attending: Gastroenterology

## 2017-08-25 ENCOUNTER — Other Ambulatory Visit: Payer: Self-pay

## 2017-08-25 ENCOUNTER — Ambulatory Visit
Admission: RE | Admit: 2017-08-25 | Discharge: 2017-08-25 | Disposition: A | Discharge status: Home / Self Care | Payer: BLUE CROSS/BLUE SHIELD | Source: Ambulatory Visit | Attending: Gastroenterology | Admitting: Gastroenterology

## 2017-08-25 DIAGNOSIS — F17210 Nicotine dependence, cigarettes, uncomplicated: Secondary | ICD-10-CM | POA: Diagnosis not present

## 2017-08-25 DIAGNOSIS — Z79899 Other long term (current) drug therapy: Secondary | ICD-10-CM | POA: Diagnosis not present

## 2017-08-25 DIAGNOSIS — K5730 Diverticulosis of large intestine without perforation or abscess without bleeding: Secondary | ICD-10-CM | POA: Insufficient documentation

## 2017-08-25 DIAGNOSIS — E039 Hypothyroidism, unspecified: Secondary | ICD-10-CM | POA: Insufficient documentation

## 2017-08-25 DIAGNOSIS — K529 Noninfective gastroenteritis and colitis, unspecified: Secondary | ICD-10-CM | POA: Diagnosis not present

## 2017-08-25 DIAGNOSIS — Z681 Body mass index (BMI) 19 or less, adult: Secondary | ICD-10-CM | POA: Diagnosis not present

## 2017-08-25 DIAGNOSIS — R634 Abnormal weight loss: Secondary | ICD-10-CM | POA: Diagnosis not present

## 2017-08-25 DIAGNOSIS — Z79890 Hormone replacement therapy (postmenopausal): Secondary | ICD-10-CM | POA: Diagnosis not present

## 2017-08-25 DIAGNOSIS — Z79891 Long term (current) use of opiate analgesic: Secondary | ICD-10-CM | POA: Insufficient documentation

## 2017-08-25 DIAGNOSIS — K219 Gastro-esophageal reflux disease without esophagitis: Secondary | ICD-10-CM | POA: Diagnosis not present

## 2017-08-25 DIAGNOSIS — I10 Essential (primary) hypertension: Secondary | ICD-10-CM | POA: Insufficient documentation

## 2017-08-25 HISTORY — PX: COLONOSCOPY WITH PROPOFOL: SHX5780

## 2017-08-25 HISTORY — PX: ESOPHAGOGASTRODUODENOSCOPY (EGD) WITH PROPOFOL: SHX5813

## 2017-08-25 SURGERY — ESOPHAGOGASTRODUODENOSCOPY (EGD) WITH PROPOFOL
Anesthesia: General

## 2017-08-25 MED ORDER — PROPOFOL 10 MG/ML IV BOLUS
INTRAVENOUS | Status: AC
Start: 2017-08-25 — End: ?
  Filled 2017-08-25: qty 20

## 2017-08-25 MED ORDER — MIDAZOLAM HCL 2 MG/2ML IJ SOLN
INTRAMUSCULAR | Status: AC
  Filled 2017-08-25: qty 2

## 2017-08-25 MED ORDER — LIDOCAINE HCL (PF) 2 % IJ SOLN
INTRAMUSCULAR | Status: AC
  Filled 2017-08-25: qty 10

## 2017-08-25 MED ORDER — PROPOFOL 500 MG/50ML IV EMUL
INTRAVENOUS | Status: DC | PRN
  Administered 2017-08-25: 120 ug/kg/min via INTRAVENOUS

## 2017-08-25 MED ORDER — FENTANYL CITRATE (PF) 100 MCG/2ML IJ SOLN
INTRAMUSCULAR | Status: DC | PRN
  Administered 2017-08-25 (×2): 50 ug via INTRAVENOUS

## 2017-08-25 MED ORDER — SODIUM CHLORIDE 0.9 % IJ SOLN
INTRAMUSCULAR | Status: AC
  Filled 2017-08-25: qty 10

## 2017-08-25 MED ORDER — PROPOFOL 10 MG/ML IV BOLUS
INTRAVENOUS | Status: AC
  Filled 2017-08-25: qty 20

## 2017-08-25 MED ORDER — PHENYLEPHRINE HCL 10 MG/ML IJ SOLN
INTRAMUSCULAR | Status: AC
  Filled 2017-08-25: qty 1

## 2017-08-25 MED ORDER — FENTANYL CITRATE (PF) 100 MCG/2ML IJ SOLN
INTRAMUSCULAR | Status: AC
  Filled 2017-08-25: qty 2

## 2017-08-25 MED ORDER — MIDAZOLAM HCL 5 MG/5ML IJ SOLN
INTRAMUSCULAR | Status: DC | PRN
  Administered 2017-08-25: 1 mg via INTRAVENOUS

## 2017-08-25 MED ORDER — EPHEDRINE SULFATE 50 MG/ML IJ SOLN
INTRAMUSCULAR | Status: AC
  Filled 2017-08-25: qty 1

## 2017-08-25 MED ORDER — PROPOFOL 10 MG/ML IV BOLUS
INTRAVENOUS | Status: DC | PRN
  Administered 2017-08-25: 80 mg via INTRAVENOUS
  Administered 2017-08-25 (×2): 20 mg via INTRAVENOUS

## 2017-08-25 MED ORDER — PROPOFOL 500 MG/50ML IV EMUL
INTRAVENOUS | Status: AC
  Filled 2017-08-25: qty 50

## 2017-08-25 MED ORDER — LIDOCAINE 2% (20 MG/ML) 5 ML SYRINGE
INTRAMUSCULAR | Status: DC | PRN
  Administered 2017-08-25: 30 mg via INTRAVENOUS

## 2017-08-25 MED ORDER — SODIUM CHLORIDE 0.9 % IV SOLN
INTRAVENOUS | Status: DC
  Administered 2017-08-25 (×3): via INTRAVENOUS

## 2017-08-25 MED ORDER — GLYCOPYRROLATE 0.2 MG/ML IJ SOLN
INTRAMUSCULAR | Status: AC
  Filled 2017-08-25: qty 1

## 2017-08-25 NOTE — Transfer of Care (Signed)
Immediate Anesthesia Transfer of Care Note  Patient: [name]  Procedure(s) Performed: ESOPHAGOGASTRODUODENOSCOPY (EGD) WITH PROPOFOL (N/A ) COLONOSCOPY WITH PROPOFOL (N/A )  Patient Location: PACU and Endoscopy Unit  Anesthesia Type:General  Level of Consciousness: awake and patient cooperative  Airway & Oxygen Therapy: Patient Spontanous Breathing and Patient connected to nasal cannula oxygen  Post-op Assessment: Report given to RN and Post -op Vital signs reviewed and stable  Post vital signs: Reviewed and stable  Last Vitals:  Vitals Value Taken Time  BP    Temp    Pulse    Resp    SpO2      Last Pain:  Vitals:   08/25/17 0719  TempSrc: Tympanic         Complications: No apparent anesthesia complications

## 2017-08-25 NOTE — Anesthesia Post-op Follow-up Note (Signed)
Anesthesia QCDR form completed.        

## 2017-08-25 NOTE — H&P (Signed)
Maria Bellows, MD [address], [place], [place], [place], [address] [id number], [place], [place], [place], [address] Phone: [phone]  Fax: [phone]  Primary Care Physician:  Maria Chou, NP   Pre-Procedure History & Physical: HPI:  [name] is a 57 y.o. female is here for an endoscopy and colonoscopy    Past Medical History:  Diagnosis Date  . Arthritis    hands, back, knees  . Diverticulitis   . GERD (gastroesophageal reflux disease)   . Hypertension    somewhat controlled, taking medication  . Hypothyroidism     Past Surgical History:  Procedure Laterality Date  . ABDOMINAL HYSTERECTOMY    . APPENDECTOMY    . CHOLECYSTECTOMY    . COLONOSCOPY  01/09/2012   Procedure: COLONOSCOPY;  Surgeon: Maria Ng, MD;  Location: [facility] ENDOSCOPY;  Service: Endoscopy;  Laterality: N/A;  . ESOPHAGOGASTRODUODENOSCOPY  01/07/2012   Procedure: ESOPHAGOGASTRODUODENOSCOPY (EGD);  Surgeon: Maria Cunas., MD;  Location: [facility] ENDOSCOPY;  Service: Endoscopy;  Laterality: N/A;  . VIDEO BRONCHOSCOPY Bilateral 05/14/2016   Procedure: VIDEO BRONCHOSCOPY WITHOUT FLUORO;  Surgeon: Maria Gobble, MD;  Location: [facility];  Service: Cardiopulmonary;  Laterality: Bilateral;    Prior to Admission medications   Medication Sig Start Date End Date Taking? Authorizing Provider  atenolol (TENORMIN) 50 MG tablet Take 50 mg by mouth daily.   Yes [name], Medical Student  Biotin 5000 MCG TABS Take by mouth daily.   Yes [provider]  Cholecalciferol (VITAMIN D-3) 5000 UNITS TABS Take 10,000 Units by mouth daily after breakfast.   Yes [provider]  Cyanocobalamin (B-12) 5000 MCG CAPS Take by mouth daily.   Yes [provider]  cyclobenzaprine (FLEXERIL) 10 MG tablet Take 1 tablet by mouth 3 (three) times daily. 08/08/17  Yes [provider]  hydrALAZINE (APRESOLINE) 10 MG tablet Take 1 tablet (10 mg total) by mouth 3 (three) times  daily. 07/20/13  Yes Maria Bossier, MD  HYDROcodone-acetaminophen (NORCO/VICODIN) 5-325 MG per tablet Take 1 tablet by mouth every 4 (four) hours as needed for moderate pain. 12/18/14  Yes Maria Schmidt, MD  levothyroxine (SYNTHROID, LEVOTHROID) 50 MCG tablet Take 50 mcg by mouth daily before breakfast.   Yes [provider]  Probiotic Product (PROBIOTIC-10 PO) Take by mouth daily.   Yes [provider]  ALPRAZolam Duanne Moron) 0.5 MG tablet Take 1 tablet by mouth 3 (three) times daily. 08/08/17   [provider]  fluconazole (DIFLUCAN) 150 MG tablet  08/17/17   [provider]  hyoscyamine (LEVBID) 0.375 MG 12 hr tablet Take 1 tablet by mouth 2 (two) times daily. 06/26/17   [provider]  pantoprazole (PROTONIX) 20 MG tablet Take 20 mg by mouth daily.  02/16/16   [provider]    Allergies as of 08/18/2017  . (No Known Allergies)    Family History  Problem Relation Age of Onset  . Cancer Mother     Social History   Socioeconomic History  . Marital status: Single    Spouse name: Not on file  . Number of children: Not on file  . Years of education: Not on file  . Highest education level: Not on file  Occupational History  . Not on file  Social Needs  . Financial resource strain: Not on file  . Food insecurity:    Worry: Not on file    Inability: Not on file  . Transportation needs:  Medical: Not on file    Non-medical: Not on file  Tobacco Use  . Smoking status: Current Some Day Smoker    Packs/day: 2.00    Years: 42.00    Pack years: 84.00    Types: Cigarettes    Last attempt to quit: 07/07/2015    Years since quitting: 2.1  . Smokeless tobacco: Never Used  . Tobacco comment: Patient states she smoke 2 cigarettes a day  Substance and Sexual Activity  . Alcohol use: Yes    Alcohol/week: 21.0 oz    Types: 7 Glasses of wine, 28 Shots of liquor per week    Comment: 01/10/13 - quit 6 to 7 months ago, prior heavy use  . Drug  use: No  . Sexual activity: Never  Lifestyle  . Physical activity:    Days per week: Not on file    Minutes per session: Not on file  . Stress: Not on file  Relationships  . Social connections:    Talks on phone: Not on file    Gets together: Not on file    Attends religious service: Not on file    Active member of club or organization: Not on file    Attends meetings of clubs or organizations: Not on file    Relationship status: Not on file  . Intimate partner violence:    Fear of current or ex partner: Not on file    Emotionally abused: Not on file    Physically abused: Not on file    Forced sexual activity: Not on file  Other Topics Concern  . Not on file  Social History Narrative  . Not on file    Review of Systems: See HPI, otherwise negative ROS  Physical Exam: BP 139/87   Pulse (!) 107   Temp (!) 96.5 F (35.8 C) (Tympanic)   Resp 20   Ht 4\' 11"  (1.499 m)   Wt 84 lb (38.1 kg)   SpO2 99%   BMI 16.97 kg/m  General:   Alert,  pleasant and cooperative in NAD Head:  Normocephalic and atraumatic. Neck:  Supple; no masses or thyromegaly. Lungs:  Clear throughout to auscultation, normal respiratory effort.    Heart:  +S1, +S2, Regular rate and rhythm, No edema. Abdomen:  Soft, nontender and nondistended. Normal bowel sounds, without guarding, and without rebound.   Neurologic:  Alert and  oriented x4;  grossly normal neurologically.  Impression/Plan: [name] is here for an endoscopy and colonoscopy  to be performed for  evaluation of unintentional weight loss    Risks, benefits, limitations, and alternatives regarding endoscopy have been reviewed with the patient.  Questions have been answered.  All parties agreeable.   Maria Bellows, MD  08/25/2017, 8:08 AM

## 2017-08-25 NOTE — Op Note (Signed)
[facility] Patient Name: [name] Procedure Date: 08/25/2017 7:53 AM MRN: [id number] Account #: [id number] Date of Birth: [birth year] Admit Type: Outpatient Age: 57 Room: [facility] ENDO ROOM 3 Gender: Female Note Status: Finalized Procedure:            Colonoscopy Indications:          Weight loss Providers:            Jonathon Bellows MD, MD Referring MD:         Alvester Chou (Referring MD) Medicines:            Monitored Anesthesia Care Complications:        No immediate complications. Procedure:            Pre-Anesthesia Assessment:                       - Prior to the procedure, a History and Physical was                        performed, and patient medications, allergies and                        sensitivities were reviewed. The patient's tolerance of                        previous anesthesia was reviewed.                       - The risks and benefits of the procedure and the                        sedation options and risks were discussed with the                        patient. All questions were answered and informed                        consent was obtained.                       - ASA Grade Assessment: III - A patient with severe                        systemic disease.                       After obtaining informed consent, the colonoscope was                        passed under direct vision. Throughout the procedure,                        the patient's blood pressure, pulse, and oxygen                        saturations were monitored continuously. The                        Colonoscope was introduced through the anus and                        advanced to the  the cecum, identified by the                        appendiceal orifice, IC valve and transillumination.                        The colonoscopy was performed with ease. The patient                        tolerated the procedure well. The quality of the bowel     preparation was adequate. Findings:      The perianal and digital rectal examinations were normal.      Multiple small-mouthed diverticula were found in the entire colon.      Localized mild inflammation characterized by congestion (edema) and       erythema was found at the hepatic flexure. Biopsies were taken with a       cold forceps for histology.      The exam was otherwise without abnormality on direct and retroflexion       views. Impression:           - Diverticulosis in the entire examined colon.                       - Localized mild inflammation was found at the hepatic                        flexure secondary to colitis. Biopsied.                       - The examination was otherwise normal on direct and                        retroflexion views. Recommendation:       - Discharge patient to home (with escort).                       - Resume previous diet.                       - Continue present medications.                       - Await pathology results.                       - Repeat colonoscopy in 5 years for surveillance.                       - Very likely all her symptoms related to long term                        NSAID use which she has just stopped and started to                        feel better.                       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 Procedure Code(s):    ---  Professional ---                       [phone], Colonoscopy, flexible; with biopsy, single or                        multiple Diagnosis Code(s):    --- Professional ---                       K52.9, Noninfective gastroenteritis and colitis,                        unspecified                       R63.4, Abnormal weight loss                       K57.30, Diverticulosis of large intestine without                        perforation or abscess without bleeding CPT copyright 2016 American Medical Association. All rights reserved. The codes  documented in this report are preliminary and upon coder review may  be revised to meet current compliance requirements. Jonathon Bellows, MD Jonathon Bellows MD, MD 08/25/2017 8:40:07 AM This report has been signed electronically. Number of Addenda: 0 Note Initiated On: 08/25/2017 7:53 AM Scope Withdrawal Time: 0 hours 8 minutes 32 seconds  Total Procedure Duration: 0 hours 13 minutes 31 seconds       [facility]

## 2017-08-25 NOTE — OR Nursing (Signed)
Pt did not take any of her meds this am.  Anesthesiologist Dr. Lavone Neri informed, he does not want pt to take her Atenolol prior to her procedures due to have an upper done.

## 2017-08-25 NOTE — Anesthesia Postprocedure Evaluation (Signed)
Anesthesia Post Note  Patient: MALIYA MARICH  Procedure(s) Performed: ESOPHAGOGASTRODUODENOSCOPY (EGD) WITH PROPOFOL (N/A ) COLONOSCOPY WITH PROPOFOL (N/A )  Patient location during evaluation: Endoscopy Anesthesia Type: General Level of consciousness: awake and alert Pain management: pain level controlled Vital Signs Assessment: post-procedure vital signs reviewed and stable Respiratory status: spontaneous breathing, nonlabored ventilation and respiratory function stable Cardiovascular status: blood pressure returned to baseline and stable Postop Assessment: no apparent nausea or vomiting Anesthetic complications: no     Last Vitals:  Vitals Value Taken Time  BP    Temp    Pulse    Resp    SpO2      Last Pain:  Vitals:   08/25/17 0902  TempSrc:   PainSc: 0-No pain                 Alphonsus Sias

## 2017-08-25 NOTE — Anesthesia Preprocedure Evaluation (Signed)
Anesthesia Evaluation  Patient identified by MRN, date of birth, ID band Patient awake    Reviewed: Allergy & Precautions, H&P , NPO status , reviewed documented beta blocker date and time   Airway Mallampati: II  TM Distance: >3 FB     Dental  (+) Chipped   Pulmonary COPD, Current Smoker,    Pulmonary exam normal        Cardiovascular hypertension, Normal cardiovascular exam     Neuro/Psych PSYCHIATRIC DISORDERS Depression    GI/Hepatic GERD  ,  Endo/Other  Hypothyroidism   Renal/GU Renal disease     Musculoskeletal  (+) Arthritis , Osteoarthritis,    Abdominal   Peds  Hematology   Anesthesia Other Findings   Reproductive/Obstetrics                             Anesthesia Physical Anesthesia Plan  ASA: III  Anesthesia Plan: General   Post-op Pain Management:    Induction:   PONV Risk Score and Plan: 3 and Propofol infusion  Airway Management Planned:   Additional Equipment:   Intra-op Plan:   Post-operative Plan:   Informed Consent: I have reviewed the patients History and Physical, chart, labs and discussed the procedure including the risks, benefits and alternatives for the proposed anesthesia with the patient or authorized representative who has indicated his/her understanding and acceptance.   Dental Advisory Given  Plan Discussed with: CRNA  Anesthesia Plan Comments:         Anesthesia Quick Evaluation

## 2017-08-25 NOTE — Op Note (Signed)
[facility] Patient Name: [name] Procedure Date: 08/25/2017 7:54 AM MRN: [id number] Account #: [id number] Date of Birth: 01/07/1961 Admit Type: Outpatient Age: 57 Room: [facility] ENDO ROOM 3 Gender: Female Note Status: Finalized Procedure:            Upper GI endoscopy Indications:          Weight loss Providers:            Jonathon Bellows MD, MD Referring MD:         Alvester Chou (Referring MD) Medicines:            Monitored Anesthesia Care Complications:        No immediate complications. Procedure:            Pre-Anesthesia Assessment:                       - ASA Grade Assessment: III - A patient with severe                        systemic disease.                       - Prior to the procedure, a History and Physical was                        performed, and patient medications, allergies and                        sensitivities were reviewed. The patient's tolerance of                        previous anesthesia was reviewed.                       - The risks and benefits of the procedure and the                        sedation options and risks were discussed with the                        patient. All questions were answered and informed                        consent was obtained.                       After obtaining informed consent, the endoscope was                        passed under direct vision. Throughout the procedure,                        the patient's blood pressure, pulse, and oxygen                        saturations were monitored continuously. The Endoscope                        was introduced through the mouth, and advanced to the  third part of duodenum. The upper GI endoscopy was                        accomplished with ease. The patient tolerated the                        procedure well. Findings:      The examined duodenum was normal.      The esophagus was normal.      The entire examined  stomach was normal. Biopsies were taken with a cold       forceps for histology.      The cardia and gastric fundus were normal on retroflexion. Impression:           - Normal examined duodenum.                       - Normal esophagus.                       - Normal stomach. Biopsied. Recommendation:       - Await pathology results.                       - Perform a colonoscopy today. Procedure Code(s):    --- Professional ---                       [phone], Esophagogastroduodenoscopy, flexible, transoral;                        with biopsy, single or multiple Diagnosis Code(s):    --- Professional ---                       R63.4, Abnormal weight loss CPT copyright 2016 American Medical Association. All rights reserved. The codes documented in this report are preliminary and upon coder review may  be revised to meet current compliance requirements. Jonathon Bellows, MD Jonathon Bellows MD, MD 08/25/2017 8:22:15 AM This report has been signed electronically. Number of Addenda: 0 Note Initiated On: 08/25/2017 7:54 AM      [facility]

## 2017-08-26 ENCOUNTER — Encounter: Payer: Self-pay | Admitting: Gastroenterology

## 2017-08-29 LAB — SURGICAL PATHOLOGY

## 2017-09-14 ENCOUNTER — Ambulatory Visit
Admission: RE | Admit: 2017-09-14 | Discharge: 2017-09-14 | Disposition: A | Discharge status: Home / Self Care | Payer: BLUE CROSS/BLUE SHIELD | Source: Ambulatory Visit | Attending: Urgent Care | Admitting: Urgent Care

## 2017-09-14 DIAGNOSIS — Z1231 Encounter for screening mammogram for malignant neoplasm of breast: Secondary | ICD-10-CM | POA: Diagnosis present

## 2017-09-14 DIAGNOSIS — R970 Elevated carcinoembryonic antigen [CEA]: Secondary | ICD-10-CM

## 2017-09-14 DIAGNOSIS — D7589 Other specified diseases of blood and blood-forming organs: Secondary | ICD-10-CM

## 2017-09-14 DIAGNOSIS — R634 Abnormal weight loss: Secondary | ICD-10-CM

## 2017-09-14 DIAGNOSIS — Z1239 Encounter for other screening for malignant neoplasm of breast: Secondary | ICD-10-CM

## 2017-09-15 ENCOUNTER — Encounter: Payer: Self-pay | Admitting: Hematology and Oncology

## 2017-09-15 ENCOUNTER — Inpatient Hospital Stay: Payer: BLUE CROSS/BLUE SHIELD | Attending: Hematology and Oncology | Admitting: Hematology and Oncology

## 2017-09-15 VITALS — BP 114/76 | HR 89 | Temp 96.3°F | Resp 18 | Wt 82.2 lb

## 2017-09-15 DIAGNOSIS — K529 Noninfective gastroenteritis and colitis, unspecified: Secondary | ICD-10-CM | POA: Diagnosis not present

## 2017-09-15 DIAGNOSIS — E039 Hypothyroidism, unspecified: Secondary | ICD-10-CM | POA: Diagnosis not present

## 2017-09-15 DIAGNOSIS — I10 Essential (primary) hypertension: Secondary | ICD-10-CM | POA: Diagnosis not present

## 2017-09-15 DIAGNOSIS — Z803 Family history of malignant neoplasm of breast: Secondary | ICD-10-CM | POA: Diagnosis not present

## 2017-09-15 DIAGNOSIS — Z8719 Personal history of other diseases of the digestive system: Secondary | ICD-10-CM | POA: Diagnosis not present

## 2017-09-15 DIAGNOSIS — F17210 Nicotine dependence, cigarettes, uncomplicated: Secondary | ICD-10-CM | POA: Insufficient documentation

## 2017-09-15 DIAGNOSIS — R634 Abnormal weight loss: Secondary | ICD-10-CM | POA: Diagnosis present

## 2017-09-15 DIAGNOSIS — Z79899 Other long term (current) drug therapy: Secondary | ICD-10-CM

## 2017-09-15 DIAGNOSIS — K5730 Diverticulosis of large intestine without perforation or abscess without bleeding: Secondary | ICD-10-CM | POA: Insufficient documentation

## 2017-09-15 DIAGNOSIS — K219 Gastro-esophageal reflux disease without esophagitis: Secondary | ICD-10-CM | POA: Diagnosis not present

## 2017-09-15 DIAGNOSIS — M129 Arthropathy, unspecified: Secondary | ICD-10-CM | POA: Diagnosis not present

## 2017-09-15 DIAGNOSIS — R42 Dizziness and giddiness: Secondary | ICD-10-CM | POA: Diagnosis not present

## 2017-09-15 DIAGNOSIS — J439 Emphysema, unspecified: Secondary | ICD-10-CM | POA: Diagnosis not present

## 2017-09-15 DIAGNOSIS — Z8711 Personal history of peptic ulcer disease: Secondary | ICD-10-CM | POA: Insufficient documentation

## 2017-09-15 DIAGNOSIS — R970 Elevated carcinoembryonic antigen [CEA]: Secondary | ICD-10-CM | POA: Diagnosis not present

## 2017-09-15 DIAGNOSIS — M542 Cervicalgia: Secondary | ICD-10-CM | POA: Diagnosis not present

## 2017-09-15 NOTE — Progress Notes (Signed)
Nutrition Assessment   Reason for Assessment:   Verbal referral from Dr. Mike Gip for weight loss, elevated CEA markers  ASSESSMENT:  57 year old female with elevated tumor markers and weight loss. Past medical history of diverticulitis, GERD, HTN, hypothyroidism.   Chart reviewed and noted recent EGD and colonscopy negative, mammogram negative, patient reported post-coital bleeding and reports "normal" exam.  Noted planning PET scan for further evaluation of weight loss, left neck tenderness and elevated CEA.    Met with patient following MD visit.  Patient reports that she has issues with constipation.  Report started taking stool softner every other day which has help some.  Reports that she gets up 30 minutes early so she can have time to have bowel movement before going to work (may have bowel movement every other day). Reports sometimes takes 20 minutes to pass often hard stool.  No nausea per patient.   Typical day is 1 egg with provolone cheese on english muffin and sweet tea for breakfast. Lunch is sandwich (2 pieces of wheat bread with mustard, 2 slices of meat and 1 slice provolone cheese), sandwich size bag of potato chips (eats a few), 6 wafer cookies with creme in the middle, 2 apples sliced for snack.  Reports she drinks 2 ensure clear daily.  Eats dinner late around 7:30--7:45pm and it includes meat and 2 vegetables or all vegetable meal sometimes has bicsuit or cornbread.  Reports that she drinks 3, 20 oz Mt Dew per day, does not like water.  Reports that she does not smoke but drinks 2 glasses of wine per night with dinner.    Reports that she has abdominal pain, cramping with eating fatty foods (greasy).  Also reports when she eats nuts she has same feeling.  Reports that she tends to drink lactose free milk but eats regular cheese without abdominal issues.    Nutrition Focused Physical Exam: deffered  Medications: biotin, Vit B 12, protonix, probiotic  Labs:  reviewed  Anthropometrics:   Height: 82 lb Weight: 4'11" UBW: 110-120 lb 2 years ago per patient BMI: 16  Per chart note noted weight loss of 30 pounds in the last year and half.    Chart weight 93 lb on 05/14/2016 Current weight 82 lb Estimated Energy Needs  Kcals: 1100-1300 calories/d Protein: 55-65 g/d Fluid: 1.3 L/d  NUTRITION DIAGNOSIS: Unintentional weight loss related to ? levated tumor markers, constipation, abdominal pain, ? etoh use as evidenced by progressive weight loss and BMI of 16   INTERVENTION:  Encouraged good bowel regimen to help with constipation.   Discussed strategies to increase calories and protein.  Educated patient on reading food labels and gave calorie goal to reach daily.  Provided sample meal plans to increase calories. Recommended patient keep food journal with calories per day.  Reviewed boost breeze (juicy type supplement) with more calories as patient currently drinking ensure clear.  Discussed ways to increase calories and protein with "juice type" shakes. Recommend checking ascorbic acid level.  Contact information provided    MONITORING, EVALUATION, GOAL: weight trends, intake   NEXT VISIT: April 25 following MD appt  Zubayr Bednarczyk B. Zenia Resides, [place], [place] Registered Dietitian [phone] (pager)

## 2017-09-15 NOTE — Progress Notes (Signed)
[facility] day:  09/15/2017  Chief Complaint: [name] is a 57 y.o. female with elevated tumor markers and weight loss who is seen for review of work-up and discussion regarding direction of therapy.  HPI:  The patient was last seen on 08/22/2017.  At that time, she described abdominal discomfort and vertigo. She had a  6-8 month history of abdominal bloating, nausea and progressive weight loss.  She had lost > 40 pounds in the past 18 months.  Exam revealed no acute findings.  PTH-rp was < 2.0 on 08/15/2017.  She underwent EGD and colonoscopy on 08/25/2017 by Dr. Vicente Males.  EGD was normal.  Gastric biopsy revealed features of a healing mucosal injury.  There was no H pylori, dysplasia or malignancy.  Colonoscopy revealed diverticulosis in the entire colon.  There was localized inflammation at the hepatic flexure secondary to colitis.  Biopsy revealed mild active inflammation with superficial crypt drop out and fibrosis/hyalinization of the lamina propria, negative for dysplasia or malignancy.  It was felt that her symptoms were due to long term NSAID use which she had stopped and felt better.  Digital screening mammogram on 09/14/2017 revealed no evidence of malignancy.  During the interim, patient is doing "about the same". She is anxious to find out what is going on. Patient notes that her bowels have been "ok". She is currently on PPI therapy (Protonix), however advising that she is going to have to stop secondary to the cost. Patient states, "my stomach is not bothering me".  She notes a good appetite, however has is consistently losing weight. She states, "I eat a full course meal at about 6:00 pm. For lunch I eat a sandwich and some chips. I take 2 of those Ensure juices, not the milk, with me to work everyday". She is down another 2 pounds since her last clinic visit; current weight 82 pounds. Paitient was at 110 pounds 1-1.5 years ago.   Today,  patient is complaining of pain to the LEFT side of her neck. She reports tenderness with palpation over the anterior cervical node chain. Patient has had a concurrent core throat for the last 2 days. She denies fevers. Patient notes that her jaw has been "popping a lot" since her last visit to the clinic.   Patient denies any further episodes of significant vaginal bleeding. Patient does note a fairly new onset of post-coital bleeding.     Past Medical History:  Diagnosis Date  . Arthritis    hands, back, knees  . Diverticulitis   . GERD (gastroesophageal reflux disease)   . Hypertension    somewhat controlled, taking medication  . Hypothyroidism     Past Surgical History:  Procedure Laterality Date  . ABDOMINAL HYSTERECTOMY    . APPENDECTOMY    . CHOLECYSTECTOMY    . COLONOSCOPY  01/09/2012   Procedure: COLONOSCOPY;  Surgeon: Lear Ng, MD;  Location: [facility] ENDOSCOPY;  Service: Endoscopy;  Laterality: N/A;  . COLONOSCOPY WITH PROPOFOL N/A 08/25/2017   Procedure: COLONOSCOPY WITH PROPOFOL;  Surgeon: Jonathon Bellows, MD;  Location: [facility] ENDOSCOPY;  Service: Gastroenterology;  Laterality: N/A;  . ESOPHAGOGASTRODUODENOSCOPY  01/07/2012   Procedure: ESOPHAGOGASTRODUODENOSCOPY (EGD);  Surgeon: Winfield Cunas., MD;  Location: [facility] ENDOSCOPY;  Service: Endoscopy;  Laterality: N/A;  . ESOPHAGOGASTRODUODENOSCOPY (EGD) WITH PROPOFOL N/A 08/25/2017   Procedure: ESOPHAGOGASTRODUODENOSCOPY (EGD) WITH PROPOFOL;  Surgeon: Jonathon Bellows, MD;  Location: [facility] ENDOSCOPY;  Service: Gastroenterology;  Laterality: N/A;  .  VIDEO BRONCHOSCOPY Bilateral 05/14/2016   Procedure: VIDEO BRONCHOSCOPY WITHOUT FLUORO;  Surgeon: Collene Gobble, MD;  Location: [facility];  Service: Cardiopulmonary;  Laterality: Bilateral;    Family History  Problem Relation Age of Onset  . Cancer Mother   . Breast cancer Mother        multiple types of cancer    Social History:  reports that she has been smoking cigarettes.  She  has a 84.00 pack-year smoking history. She has never used smokeless tobacco. She reports that she drinks about 21.0 oz of alcohol per week. She reports that she does not use drugs. She previously smoked 2 packs/day x 42 years.  She cut back smoking 1 1/2 years ago.  She currently smokes 2 cigarettes/day.  Former heavy alcohol (beer) drinker; "I drank a case of beer every other day". Patient currently drinks a single glass of wine every other day. Patient is employed at [employer] as a Clinical research associate. Patient denies known exposures to radiation on toxins.  Lives with her boyfriend of 15 years. The patient is alone today.   Allergies: No Known Allergies  Current Medications: Current Outpatient Medications  Medication Sig Dispense Refill  . ALPRAZolam (XANAX) 0.5 MG tablet Take 1 tablet by mouth 3 (three) times daily.  2  . atenolol (TENORMIN) 50 MG tablet Take 50 mg by mouth daily.    . Biotin 5000 MCG TABS Take by mouth daily.    . Cholecalciferol (VITAMIN D-3) 5000 UNITS TABS Take 10,000 Units by mouth daily after breakfast.    . Cyanocobalamin (B-12) 5000 MCG CAPS Take by mouth daily.    . cyclobenzaprine (FLEXERIL) 10 MG tablet Take 1 tablet by mouth 3 (three) times daily.  1  . hydrALAZINE (APRESOLINE) 10 MG tablet Take 1 tablet (10 mg total) by mouth 3 (three) times daily. 90 tablet 0  . HYDROcodone-acetaminophen (NORCO/VICODIN) 5-325 MG per tablet Take 1 tablet by mouth every 4 (four) hours as needed for moderate pain. 15 tablet 0  . hyoscyamine (LEVBID) 0.375 MG 12 hr tablet Take 1 tablet by mouth 2 (two) times daily.  1  . levothyroxine (SYNTHROID, LEVOTHROID) 50 MCG tablet Take 50 mcg by mouth daily before breakfast.    . pantoprazole (PROTONIX) 20 MG tablet Take 20 mg by mouth daily.   4  . Probiotic Product (PROBIOTIC-10 PO) Take by mouth daily.    . fluconazole (DIFLUCAN) 150 MG tablet   0   No current facility-administered medications for this visit.     Review of Systems:  GENERAL:  Fatigue.  No fevers.  Night sweats x 2 years.  Weight loss > 40 pounds in 18 months.  Weight down 2 pounds since last visit. PERFORMANCE STATUS (ECOG):  1 HEENT: Sore throat x 2 days.  No visual changes, runny nose, mouth sores or tenderness. Lungs: No shortness of breath or cough.  No hemoptysis. Cardiac:  No chest pain, palpitations, orthopnea, or PND. GI:  Bloating.  Nausea. Constipation; improved with stool softeners.  Abdominal pain; improved.  No vomiting, diarrhea, melena or hematochezia. Diffuse abdominal tenderness.  GU:  Vaginal bleeding; mainly post-coital.  No urgency, frequency, dysuria, or hematuria. Musculoskeletal:  LEFT neck tenderness. Chronic low back pain; sees pain management. No joint pain.  No muscle tenderness. Extremities:  No pain or swelling. Skin:  No rashes or skin changes. Neuro:  No headache, numbness or weakness, balance or coordination issues. Endocrine: Night sweats x 2 years. No diabetes, thyroid issues, or hot flashes. Psych:  Depression and anxiety related to decline in health.  Pain:  4/10 - back Review of systems:  All other systems reviewed and found to be negative.  Physical Exam: Blood pressure 114/76, pulse 89, temperature (!) 96.3 F (35.7 C), temperature source Tympanic, resp. rate 18, weight 82 lb 4 oz (37.3 kg). GENERAL: Thin woman sitting comfortably in the exam room in no acute distress. MENTAL STATUS:  Alert and oriented to person, place and time. HEAD:   Brown hair pulled back.  Normocephalic, atraumatic, face symmetric, no Cushingoid features. EYES:  Brown eyes.  Pupils equal round and reactive to light and accomodation.  No conjunctivitis or scleral icterus. ENT:  Oropharynx clear without lesion.  Tongue normal.  Mucous membranes moist.  NECK:  Tender left neck without palpable adenopathy. RESPIRATORY:  Clear to auscultation without rales, wheezes or rhonchi. CARDIOVASCULAR:  Regular rate and rhythm without murmur, rub or gallop. ABDOMEN:  Scaphoid  abdomen.  Soft, minimally tender without guarding or rebound tenderness.  Active bowel sounds, and no hepatosplenomegaly.  No masses. SKIN:  No rashes, ulcers or lesions. EXTREMITIES: No edema, no skin discoloration or tenderness.  No palpable cords. LYMPH NODES: No palpable cervical, supraclavicular, axillary or inguinal adenopathy  NEUROLOGICAL: Unremarkable. PSYCH:  Appropriate.   No visits with results within 3 Day(s) from this visit.  Latest known visit with results is:  Admission on 08/25/2017, Discharged on 08/25/2017  Component Date Value Ref Range Status  . SURGICAL PATHOLOGY 08/25/2017    Final                   Value:Surgical Pathology CASE: [id number] PATIENT: [name] Surgical Pathology Report     SPECIMEN SUBMITTED: A. Stomach, random rule out gastritis;cbx B. Colon, hepatic flexure rule out colitis long term NSAID use;cbx  CLINICAL HISTORY: None provided  PRE-OPERATIVE DIAGNOSIS: Weight loss, R63.4  POST-OPERATIVE DIAGNOSIS: Normal Endoscopy; Diverticulosis; inflammation of colon at hepatic flexure; long term NSAID use;     DIAGNOSIS: A.  RANDOM STOMACH; COLD BIOPSY: - ANTRAL MUCOSA WITH FEATURES OF A HEALING MUCOSAL INJURY. - OXYNTIC MUCOSA WITH PROTON PUMP INHIBITOR EFFECT. - NEGATIVE FOR H. PYLORI, DYSPLASIA AND MALIGNANCY.  B. COLON, HEPATIC FLEXURE; COLD BIOPSY: - MILD ACTIVE INFLAMMATION, SUPERFICIAL CRYPT DROP OUT AND FIBROSIS/HYALINIZATION OF THE LAMINA PROPRIA, SEE NOTE. - NEGATIVE FOR DYSPLASIA AND MALIGNANCY. - DEEPER LEVELS WERE EXAMINED.  Note: The differential diagnosis includes drug (NSAID) effect, ischemia, and nonspecific colitis.                            GROSS DESCRIPTION:  A. Labeled: C BX random gastric rule out gastritis  Tissue fragment(s): 3  Size: 0.3-0.5 cm  Description: in formalin, pink-tan fragments  Entirely submitted in 1 cassette(s).  B. Labeled: C BX inflammation colon at hepatic flexure rule  out colitis, long-term NSAID use  Tissue fragment(s): 1  Size: 0.6 cm  Description: in formalin, pink-red fragment  Entirely submitted in 1 cassette(s).        Final Diagnosis performed by Delorse Lek, MD.  Electronically signed 08/29/2017 4:34:30PM    The electronic signature indicates that the named Attending Pathologist has evaluated the specimen  Technical component performed at [facility], [address] [place], [place] [address] Lab: [id number] Dir: Rush Farmer, MD, MMM  Professional component performed at [facility], [facility], [place], [place], [place] [address] Lab: [id number] Dir: Dellia Nims. Reuel Derby, MD      Assessment:  JANYLAH BELGRAVE is a 57 y.o. female with a 2 year history of weight loss and an elevated CEA.  She has a > 50-pack-year smoking history.  She has a history of heavy alcohol use > 5 years ago.  She has macrocytic RBC indices.  CEA has been followed: 6.6 in 2017, 13.87 on 01/21/2016, 12.61 on 04/08/2016, and 11.4 on 07/14/2017.  Work-up in 2017 included the following negative studies: hepatitis, HIV testing,TSH, and celiac testing were negative.  SPEP was negative in 01/2017.  Work-up in 07/2017 revealed the following normal studies: TSH, ANA, RPR, C-reactive protein.  Calcium and creatinine were elevated.  PTH-rp was < 2.0 on 08/15/2017.  Free T4 was 1.22 (slightly elevated) on 08/15/2017.  Chest, abdomen, and pelvic CT on 01/27/2016 revealed multiple ill-defined pulmonary nodules up to 8 mm.  There was no mediastinal adenopathy.  There was no mass or adenopathy in the abdomen or pelvis.  PET scan on 02/06/2016 revealed ground glass pulmonary nodules up to 8 mm (max SUV 1.2), possibly inflammatory or neoplastic.  Chest CT on 04/08/2016 revealed predominant upper lobe areas of peribronchial peribronchovascular nodularity.  Many nodules had resolved and there were several new nodules.  Findings were most likely related to an  inflammatory or infectious process.  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   Bronchoscopy on 05/14/2016 revealed no endobronchial lesions.  Epiglottis biopsy revealed squamous papilloma.  Weight eventually improved to 103 pounds.  CXR on 07/18/2017 revealed clear lungs.  Abdomen and pelvic CT on 08/03/2017 revealed no CT evidence for acute intra-abdominal or pelvic abnormality.  Chest CT on 08/18/2017 revealed no new or worrisome pulmonary findings. There was stable emphysematous changes and no mediastinal or hilar mass or adenopathy.  EGD on 01/07/2012 revealed a normal esophagus and duodenum.  There was a small antral gastric ulcer.  EGD on 08/25/2017 was normal.  Gastric biopsy revealed features of a healing mucosal injury.  There was no H pylori, dysplasia or malignancy.    Colonoscopy on 01/09/2012 revealed diffuse diverticulosis, medium size internal hemorrhoids, and a small polyp s/p snare cautery (no path obtained).  Colonoscopy on 08/25/2017 revealed diverticulosis in the entire colon.  There was localized inflammation at the hepatic flexure secondary to colitis.  Biopsy revealed mild active inflammation with superficial crypt drop out and fibrosis/hyalinization of the lamina propria, negative for dysplasia or malignancy.  She describes one episode of vaginal bleeding when she got out of the shower which covered a towel.  She is s/p bilateral oophorectomy, but is unsure if her uterus remains.  Mammogram on 07/07/2015 revealed no evidence of malignancy.  Digital screening mammogram on 09/14/2017 revealed no evidence of malignancy.  Symptomatically, she notes improvement of her previously reported abdominal pain and bowel issues. Patient has had no further vaginal bleeding. She notes minor post-coital bleeding.  Patient continues to lose weight.  She has lost in excess of 30-40 pounds in the past 1.5 years. Exam reveals no acute findings.  Plan: 1.  Review interval EGD  and colonoscopy- no dysplasia or malignancy.  2.  Review interval mammogram- normal. 3.  Discuss recent GYN consult. Pelvic exam performed and was "normal" per patient. Encouraged patient to discuss post-coital bleeding with GYN.  4.  Discuss progressive weight loss.  Refer to dietician for nutritional assessment. Desmond Lope will meet with patient today. Continue to increase intake of calorie and protein dense foods.  5.  Schedule PET scan to further evaluate progressive weight loss,  LEFT neck tenderness,  and elevated CEA.  6.  RTC after PET scan for MD assessment and discussions regarding continued plan of care.    Honor Loh, NP  09/15/2017, 11:49 AM   I saw and evaluated the patient, participating in the key portions of the service and reviewing pertinent diagnostic studies and records.  I reviewed the nurse practitioner's note and agree with the findings and the plan.  The assessment and plan were discussed with the patient.  Several questions were asked by the patient and answered.   Nolon Stalls, MD 09/15/17, 11:49 AM

## 2017-09-15 NOTE — Progress Notes (Signed)
Patient is here today for review of workup.  States she had her mammo yesterday.

## 2017-09-19 ENCOUNTER — Ambulatory Visit: Payer: BLUE CROSS/BLUE SHIELD | Admitting: Hematology and Oncology

## 2017-09-21 ENCOUNTER — Ambulatory Visit: Payer: BLUE CROSS/BLUE SHIELD

## 2017-09-22 ENCOUNTER — Encounter (INDEPENDENT_AMBULATORY_CARE_PROVIDER_SITE_OTHER): Payer: Self-pay

## 2017-09-29 ENCOUNTER — Inpatient Hospital Stay: Payer: BLUE CROSS/BLUE SHIELD

## 2017-09-29 ENCOUNTER — Inpatient Hospital Stay: Payer: BLUE CROSS/BLUE SHIELD | Admitting: Hematology and Oncology

## 2017-10-05 ENCOUNTER — Ambulatory Visit: Payer: BLUE CROSS/BLUE SHIELD | Admitting: Gastroenterology

## 2017-10-06 ENCOUNTER — Encounter: Payer: Self-pay | Admitting: Hematology and Oncology

## 2017-10-11 ENCOUNTER — Ambulatory Visit: Payer: BLUE CROSS/BLUE SHIELD | Admitting: Gastroenterology

## 2017-12-23 ENCOUNTER — Telehealth: Payer: Self-pay | Admitting: *Deleted

## 2017-12-23 ENCOUNTER — Inpatient Hospital Stay: Payer: BLUE CROSS/BLUE SHIELD | Attending: Oncology | Admitting: Oncology

## 2017-12-23 ENCOUNTER — Encounter: Payer: Self-pay | Admitting: Oncology

## 2017-12-23 ENCOUNTER — Inpatient Hospital Stay: Payer: BLUE CROSS/BLUE SHIELD

## 2017-12-23 VITALS — BP 93/60 | HR 90 | Temp 96.3°F | Wt 85.0 lb

## 2017-12-23 DIAGNOSIS — I700 Atherosclerosis of aorta: Secondary | ICD-10-CM | POA: Diagnosis not present

## 2017-12-23 DIAGNOSIS — R970 Elevated carcinoembryonic antigen [CEA]: Secondary | ICD-10-CM

## 2017-12-23 DIAGNOSIS — R918 Other nonspecific abnormal finding of lung field: Secondary | ICD-10-CM

## 2017-12-23 DIAGNOSIS — Z90722 Acquired absence of ovaries, bilateral: Secondary | ICD-10-CM | POA: Diagnosis not present

## 2017-12-23 DIAGNOSIS — M25551 Pain in right hip: Secondary | ICD-10-CM

## 2017-12-23 DIAGNOSIS — F17210 Nicotine dependence, cigarettes, uncomplicated: Secondary | ICD-10-CM | POA: Diagnosis not present

## 2017-12-23 DIAGNOSIS — N289 Disorder of kidney and ureter, unspecified: Secondary | ICD-10-CM | POA: Diagnosis not present

## 2017-12-23 DIAGNOSIS — R634 Abnormal weight loss: Secondary | ICD-10-CM

## 2017-12-23 DIAGNOSIS — J439 Emphysema, unspecified: Secondary | ICD-10-CM | POA: Insufficient documentation

## 2017-12-23 DIAGNOSIS — Z8711 Personal history of peptic ulcer disease: Secondary | ICD-10-CM | POA: Diagnosis not present

## 2017-12-23 DIAGNOSIS — Z681 Body mass index (BMI) 19 or less, adult: Secondary | ICD-10-CM | POA: Insufficient documentation

## 2017-12-23 DIAGNOSIS — M898X9 Other specified disorders of bone, unspecified site: Secondary | ICD-10-CM

## 2017-12-23 DIAGNOSIS — K5909 Other constipation: Secondary | ICD-10-CM | POA: Insufficient documentation

## 2017-12-23 DIAGNOSIS — Z8719 Personal history of other diseases of the digestive system: Secondary | ICD-10-CM | POA: Diagnosis not present

## 2017-12-23 DIAGNOSIS — E46 Unspecified protein-calorie malnutrition: Secondary | ICD-10-CM | POA: Insufficient documentation

## 2017-12-23 DIAGNOSIS — M129 Arthropathy, unspecified: Secondary | ICD-10-CM

## 2017-12-23 DIAGNOSIS — E039 Hypothyroidism, unspecified: Secondary | ICD-10-CM

## 2017-12-23 DIAGNOSIS — M25552 Pain in left hip: Secondary | ICD-10-CM | POA: Diagnosis not present

## 2017-12-23 DIAGNOSIS — K219 Gastro-esophageal reflux disease without esophagitis: Secondary | ICD-10-CM | POA: Insufficient documentation

## 2017-12-23 DIAGNOSIS — I10 Essential (primary) hypertension: Secondary | ICD-10-CM | POA: Diagnosis not present

## 2017-12-23 LAB — CBC WITH DIFFERENTIAL/PLATELET
Basophils Absolute: 0 10*3/uL (ref 0–0.1)
Basophils Relative: 1 %
EOS ABS: 0.5 10*3/uL (ref 0–0.7)
Eosinophils Relative: 7 %
HCT: 41.8 % (ref 35.0–47.0)
Hemoglobin: 14.3 g/dL (ref 12.0–16.0)
LYMPHS ABS: 2.1 10*3/uL (ref 1.0–3.6)
Lymphocytes Relative: 25 %
MCH: 36.3 pg — ABNORMAL HIGH (ref 26.0–34.0)
MCHC: 34.2 g/dL (ref 32.0–36.0)
MCV: 106.4 fL — ABNORMAL HIGH (ref 80.0–100.0)
MONOS PCT: 7 %
Monocytes Absolute: 0.6 10*3/uL (ref 0.2–0.9)
Neutro Abs: 5 10*3/uL (ref 1.4–6.5)
Neutrophils Relative %: 60 %
PLATELETS: 258 10*3/uL (ref 150–440)
RBC: 3.93 MIL/uL (ref 3.80–5.20)
RDW: 12.6 % (ref 11.5–14.5)
WBC: 8.3 10*3/uL (ref 3.6–11.0)

## 2017-12-23 NOTE — Telephone Encounter (Signed)
Per [name], patient called back to state she cannot wait for Dr Mike Gip to return.  [name] was advised to check with [facility] to inquire if they can see her today.

## 2017-12-23 NOTE — Progress Notes (Signed)
Symptom Management Consult note [address] [facility]  [phone] [phone]  Patient Care Team: Alvester Chou, NP as PCP - General (Nurse Practitioner) Alvester Chou, NP as PCP - Internal Medicine (Nurse Practitioner) Clent Jacks, RN as Registered Nurse   Name of the patient: [name]  [id number]  Jan 07, 1961   Date of visit: 12/27/17  Diagnosis- elevated CEA   Chief complaint/ Reason for visit- Hip and leg pain   Heme/Onc history: [name] is a 57 y.o. female with a 2 year history of weight loss and an elevated CEA.  She has a > 50-pack-year smoking history.  She has a history of heavy alcohol use > 5 years ago.  She has macrocytic RBC indices.  CEA has been followed: 6.6 in 2017, 13.87 on 01/21/2016, 12.61 on 04/08/2016, and 11.4 on 07/14/2017.  Work-up in 2017 included the following negative studies: hepatitis, HIV testing,TSH, and celiac testing were negative.  SPEP was negative in 01/2017.  Work-up in 07/2017 revealed the following normal studies: TSH, ANA, RPR, C-reactive protein.  Calcium and creatinine were elevated.  PTH-rp was < 2.0 on 08/15/2017.  Free T4 was 1.22 (slightly elevated) on 08/15/2017.  Chest, abdomen, and pelvic CT on 01/27/2016 revealed multiple ill-defined pulmonary nodules up to 8 mm.  There was no mediastinal adenopathy.  There was no mass or adenopathy in the abdomen or pelvis.  PET scan on 02/06/2016 revealed ground glass pulmonary nodules up to 8 mm (max SUV 1.2), possibly inflammatory or neoplastic.  Chest CT on 04/08/2016 revealed predominant upper lobe areas of peribronchial peribronchovascular nodularity.  Many nodules had resolved and there were several new nodules.  Findings were most likely related to an inflammatory or infectious process.  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   Bronchoscopy on 05/14/2016 revealed no endobronchial lesions.  Epiglottis biopsy revealed  squamous papilloma.  Weight eventually improved to 103 pounds.  CXR on 07/18/2017 revealed clear lungs.  Abdomen and pelvic CT on 08/03/2017 revealed no CT evidence for acute intra-abdominal or pelvic abnormality.  Chest CT on 08/18/2017 revealed no new or worrisome pulmonary findings. There was stable emphysematous changes and no mediastinal or hilar mass or adenopathy.  EGD on 01/07/2012 revealed a normal esophagus and duodenum.  There was a small antral gastric ulcer.  EGD on 08/25/2017 was normal.  Gastric biopsy revealed features of a healing mucosal injury.  There was no H pylori, dysplasia or malignancy.    Colonoscopy on 01/09/2012 revealed diffuse diverticulosis, medium size internal hemorrhoids, and a small polyp s/p snare cautery (no path obtained).  Colonoscopy on 08/25/2017 revealed diverticulosis in the entire colon.  There was localized inflammation at the hepatic flexure secondary to colitis.  Biopsy revealed mild active inflammation with superficial crypt drop out and fibrosis/hyalinization of the lamina propria, negative for dysplasia or malignancy.  She describes one episode of vaginal bleeding when she got out of the shower which covered a towel.  She is s/p bilateral oophorectomy, but is unsure if her uterus remains.  Mammogram on 07/07/2015 revealed no evidence of malignancy.  Digital screening mammogram on 09/14/2017 revealed no evidence of malignancy.  Interval history-  Patient complains of bilateral hip and leg pain.  She has had problems with her bilateral hip over the past 2weeks and has been treated conservatively with over-the-counter anti-inflammatories and activity modification.  Pain has progressed significantly over  2days to the point where the patient has 9 out of 10 hip pain with activity.  Patient does have rest/ night time pain.  ECOG FS:1 - Symptomatic but completely ambulatory  Review of systems- Review of Systems  Constitutional: Positive for  malaise/fatigue and weight loss. Negative for chills and fever.  HENT: Negative for congestion and ear pain.   Eyes: Negative.  Negative for blurred vision and double vision.  Respiratory: Negative.  Negative for cough, sputum production and shortness of breath.   Cardiovascular: Negative.  Negative for chest pain, palpitations and leg swelling.  Gastrointestinal: Negative.  Negative for abdominal pain, constipation, diarrhea, nausea and vomiting.  Genitourinary: Negative for dysuria, frequency and urgency.  Musculoskeletal: Positive for falls, joint pain and myalgias. Negative for back pain.  Skin: Negative.  Negative for rash.  Neurological: Negative.  Negative for weakness and headaches.  Endo/Heme/Allergies: Negative.  Does not bruise/bleed easily.  Psychiatric/Behavioral: Positive for depression. The patient is nervous/anxious. The patient does not have insomnia.      Current treatment- None  No Known Allergies   Past Medical History:  Diagnosis Date  . Arthritis    hands, back, knees  . Diverticulitis   . GERD (gastroesophageal reflux disease)   . Hypertension    somewhat controlled, taking medication  . Hypothyroidism      Past Surgical History:  Procedure Laterality Date  . ABDOMINAL HYSTERECTOMY    . APPENDECTOMY    . CHOLECYSTECTOMY    . COLONOSCOPY  01/09/2012   Procedure: COLONOSCOPY;  Surgeon: Lear Ng, MD;  Location: [facility] ENDOSCOPY;  Service: Endoscopy;  Laterality: N/A;  . COLONOSCOPY WITH PROPOFOL N/A 08/25/2017   Procedure: COLONOSCOPY WITH PROPOFOL;  Surgeon: Jonathon Bellows, MD;  Location: [facility] ENDOSCOPY;  Service: Gastroenterology;  Laterality: N/A;  . ESOPHAGOGASTRODUODENOSCOPY  01/07/2012   Procedure: ESOPHAGOGASTRODUODENOSCOPY (EGD);  Surgeon: Winfield Cunas., MD;  Location: [facility] ENDOSCOPY;  Service: Endoscopy;  Laterality: N/A;  . ESOPHAGOGASTRODUODENOSCOPY (EGD) WITH PROPOFOL N/A 08/25/2017   Procedure: ESOPHAGOGASTRODUODENOSCOPY (EGD) WITH  PROPOFOL;  Surgeon: Jonathon Bellows, MD;  Location: [facility] ENDOSCOPY;  Service: Gastroenterology;  Laterality: N/A;  . VIDEO BRONCHOSCOPY Bilateral 05/14/2016   Procedure: VIDEO BRONCHOSCOPY WITHOUT FLUORO;  Surgeon: Collene Gobble, MD;  Location: [place];  Service: Cardiopulmonary;  Laterality: Bilateral;    Social History   Socioeconomic History  . Marital status: Single    Spouse name: Not on file  . Number of children: Not on file  . Years of education: Not on file  . Highest education level: Not on file  Occupational History  . Not on file  Social Needs  . Financial resource strain: Not on file  . Food insecurity:    Worry: Not on file    Inability: Not on file  . Transportation needs:    Medical: Not on file    Non-medical: Not on file  Tobacco Use  . Smoking status: Current Some Day Smoker    Packs/day: 2.00    Years: 42.00    Pack years: 84.00    Types: Cigarettes    Last attempt to quit: 07/07/2015    Years since quitting: 2.4  . Smokeless tobacco: Never Used  . Tobacco comment: Patient states she smoke 2 cigarettes a day  Substance and Sexual Activity  . Alcohol use: Yes    Alcohol/week: 21.0 oz    Types: 7 Glasses of wine, 28 Shots of liquor per week    Comment: 01/10/13 - quit 6 to 7 months ago, prior heavy use  . Drug use: No  . Sexual activity: Never  Lifestyle  .  Physical activity:    Days per week: Not on file    Minutes per session: Not on file  . Stress: Not on file  Relationships  . Social connections:    Talks on phone: Not on file    Gets together: Not on file    Attends religious service: Not on file    Active member of club or organization: Not on file    Attends meetings of clubs or organizations: Not on file    Relationship status: Not on file  . Intimate partner violence:    Fear of current or ex partner: Not on file    Emotionally abused: Not on file    Physically abused: Not on file    Forced sexual activity: Not on file  Other Topics  Concern  . Not on file  Social History Narrative  . Not on file    Family History  Problem Relation Age of Onset  . Cancer Mother   . Breast cancer Mother        multiple types of cancer     Current Outpatient Medications:  .  ALPRAZolam (XANAX) 0.5 MG tablet, Take 1 tablet by mouth 3 (three) times daily., Disp: , Rfl: 2 .  Biotin 5000 MCG TABS, Take by mouth daily., Disp: , Rfl:  .  Cholecalciferol (VITAMIN D-3) 5000 UNITS TABS, Take 10,000 Units by mouth daily after breakfast., Disp: , Rfl:  .  Cyanocobalamin (B-12) 5000 MCG CAPS, Take by mouth daily., Disp: , Rfl:  .  cyclobenzaprine (FLEXERIL) 10 MG tablet, Take 1 tablet by mouth 3 (three) times daily., Disp: , Rfl: 1 .  HYDROcodone-acetaminophen (NORCO/VICODIN) 5-325 MG per tablet, Take 1 tablet by mouth every 4 (four) hours as needed for moderate pain., Disp: 15 tablet, Rfl: 0 .  levothyroxine (SYNTHROID, LEVOTHROID) 50 MCG tablet, Take 50 mcg by mouth daily before breakfast., Disp: , Rfl:  .  pantoprazole (PROTONIX) 20 MG tablet, Take 20 mg by mouth daily. , Disp: , Rfl: 4 .  Probiotic Product (PROBIOTIC-10 PO), Take by mouth daily., Disp: , Rfl:  .  fluconazole (DIFLUCAN) 150 MG tablet, , Disp: , Rfl: 0 .  hyoscyamine (LEVBID) 0.375 MG 12 hr tablet, Take 1 tablet by mouth 2 (two) times daily., Disp: , Rfl: 1  Physical exam:  Vitals:   12/23/17 1450  BP: 93/60  Pulse: 90  Temp: (!) 96.3 F (35.7 C)  TempSrc: Tympanic  SpO2: 96%  Weight: 85 lb (38.6 kg)   Physical Exam  Constitutional: She is oriented to person, place, and time. Vital signs are normal. She appears cachectic. She appears ill. She appears distressed.  HENT:  Head: Normocephalic and atraumatic.  Eyes: Pupils are equal, round, and reactive to light.  Neck: Normal range of motion.  Cardiovascular: Normal rate, regular rhythm and normal heart sounds.  No murmur heard. Pulmonary/Chest: Effort normal. She has decreased breath sounds. She has wheezes in the  right lower field.  Abdominal: Soft. Normal appearance and bowel sounds are normal. She exhibits no distension. There is no tenderness.  Musculoskeletal: Normal range of motion. She exhibits no edema.  Neurological: She is alert and oriented to person, place, and time.  Skin: Skin is warm and dry. No rash noted.  Psychiatric: Judgment normal.  Nursing note and vitals reviewed.    CMP Latest Ref Rng & Units 08/15/2017  Glucose 65 - 99 mg/dL 111(H)  BUN 6 - 20 mg/dL 16  Creatinine 0.44 - 1.00 mg/dL 1.09(H)  Sodium 135 - 145 mmol/L 139  Potassium 3.5 - 5.1 mmol/L 4.4  Chloride 101 - 111 mmol/L 104  CO2 22 - 32 mmol/L 26  Calcium 8.7 - 10.2 mg/dL 10.2  Total Protein 6.5 - 8.1 g/dL 7.5  Total Bilirubin 0.3 - 1.2 mg/dL 0.6  Alkaline Phos 38 - 126 U/L 65  AST 15 - 41 U/L 21  ALT 14 - 54 U/L 10(L)   CBC Latest Ref Rng & Units 12/23/2017  WBC 3.6 - 11.0 K/uL 8.3  Hemoglobin 12.0 - 16.0 g/dL 14.3  Hematocrit 35.0 - 47.0 % 41.8  Platelets 150 - 440 K/uL 258    No images are attached to the encounter.  No results found.   Assessment and plan- Patient is a 57 y.o. female who presents for bilateral hip and leg pain.  She was sent over by her PCP for evaluation.  1. Elevated CEA: Pet scan denied previously.  Primary medical oncologist Dr. Mike Gip  attempted to get a PET scan given patient's significant weight loss, consistently elevated CEA and progressive musculoskeletal pain but unfortunately it was denied by her insurance company.  Patient was lost to follow-up.  Given new progressive symptoms, will get Bone scan (if approved), CT abdomen, pelvis and chest for next week. CEA at this visit continues to be elevated but is better/decreasing 9.5.   We will have her return to clinic after scans for results and treatment planning.  Plan for today is to get stat labs including CEA, CBC, ANA and TSH.  TSH low: Sent lab work to PCP for Synthroid adjustment. Macrocytic anemia: Likely due to  smoking. Folate normal. B12 elevated. ANA: Negative.  CEA: elevated. Trending down.  2.  Malnutrition/weight loss: Patient weight has remained stable and is 85 pounds today.  This is been her baseline weight for approximately 1 year.  Will supply her with Ensure Clear samples.  Patient states she was told to stay away from dairy products d/t GI upset.  We will also set her up with a dietary referral.   3.  Bilateral leg and hip pain: We will get bone scan ASAP.  PET scan previously denied.  Scheduled for 12/28/2017.  Waiting to see if CT abdomen/pelvis and chest is approved.    Visit Diagnosis 1. Weight loss   2. Bone pain   3. Elevated CEA     Patient expressed understanding and was in agreement with this plan. She also understands that She can call clinic at any time with any questions, concerns, or complaints.   Greater than 50% was spent in counseling and coordination of care with this patient including but not limited to discussion of the relevant topics above (See A&P) including, but not limited to diagnosis and management of acute and chronic medical conditions.    Faythe Casa, AGNP-C [facility]- [phone] Pager- [phone] 12/27/2017 11:51 AM

## 2017-12-23 NOTE — Telephone Encounter (Signed)
Patient has recently seen her PCP Darleene Cleaver. PCP recommended that she schedule an appointment with Dr. Mike Gip secondary to severe bone pain. Patient has no future appointments.

## 2017-12-24 LAB — THYROID PANEL WITH TSH
Free Thyroxine Index: 4.4 (ref 1.2–4.9)
T3 UPTAKE RATIO: 50 % — AB (ref 24–39)
T4 TOTAL: 8.8 ug/dL (ref 4.5–12.0)
TSH: 0.417 u[IU]/mL — AB (ref 0.450–4.500)

## 2017-12-24 LAB — ANA COMPREHENSIVE PANEL
Anti JO-1: <0.2 AI (ref 0.0–0.9)
Chromatin Ab SerPl-aCnc: <0.2 AI (ref 0.0–0.9)
ENA SM Ab Ser-aCnc: <0.2 AI (ref 0.0–0.9)
Ribonucleic Protein: 0.9 AI (ref 0.0–0.9)
SSA (Ro) (ENA) Antibody, IgG: 0.4 AI (ref 0.0–0.9)
SSB (La) (ENA) Antibody, IgG: <0.2 AI (ref 0.0–0.9)
Scleroderma (Scl-70) (ENA) Antibody, IgG: <0.2 AI (ref 0.0–0.9)

## 2017-12-24 LAB — CEA: CEA1: 9.5 ng/mL — AB (ref 0.0–4.7)

## 2017-12-27 ENCOUNTER — Telehealth: Payer: Self-pay | Admitting: *Deleted

## 2017-12-27 NOTE — Telephone Encounter (Signed)
Patient called and asks what we found out from her visit Friday. Please return call [phone]

## 2017-12-27 NOTE — Telephone Encounter (Signed)
Called patient and made aware of all her scheduled appts.  Per Gaspar Bidding to schedule pt. for a CT CAP appt is scheduled for  01/02/18. she is aware of location, date and time.

## 2017-12-28 ENCOUNTER — Ambulatory Visit
Admission: RE | Admit: 2017-12-28 | Discharge: 2017-12-28 | Disposition: A | Discharge status: Home / Self Care | Payer: BLUE CROSS/BLUE SHIELD | Source: Ambulatory Visit | Attending: Oncology | Admitting: Oncology

## 2017-12-28 ENCOUNTER — Encounter
Admission: RE | Admit: 2017-12-28 | Discharge: 2017-12-28 | Disposition: A | Discharge status: Home / Self Care | Payer: BLUE CROSS/BLUE SHIELD | Source: Ambulatory Visit | Attending: Oncology | Admitting: Oncology

## 2017-12-28 DIAGNOSIS — M159 Polyosteoarthritis, unspecified: Secondary | ICD-10-CM | POA: Diagnosis not present

## 2017-12-28 DIAGNOSIS — M898X9 Other specified disorders of bone, unspecified site: Secondary | ICD-10-CM | POA: Diagnosis present

## 2017-12-28 MED ORDER — TECHNETIUM TC 99M MEDRONATE IV KIT
20.0000 | PACK | Freq: Once | INTRAVENOUS | Status: AC | PRN
  Administered 2017-12-28: 24.03 via INTRAVENOUS

## 2018-01-02 ENCOUNTER — Other Ambulatory Visit: Payer: Self-pay | Admitting: *Deleted

## 2018-01-02 ENCOUNTER — Ambulatory Visit
Admission: RE | Admit: 2018-01-02 | Discharge: 2018-01-02 | Disposition: A | Discharge status: Home / Self Care | Payer: BLUE CROSS/BLUE SHIELD | Source: Ambulatory Visit | Attending: Urgent Care | Admitting: Urgent Care

## 2018-01-02 DIAGNOSIS — J439 Emphysema, unspecified: Secondary | ICD-10-CM | POA: Diagnosis not present

## 2018-01-02 DIAGNOSIS — I2510 Atherosclerotic heart disease of native coronary artery without angina pectoris: Secondary | ICD-10-CM | POA: Diagnosis not present

## 2018-01-02 DIAGNOSIS — R970 Elevated carcinoembryonic antigen [CEA]: Secondary | ICD-10-CM | POA: Diagnosis not present

## 2018-01-02 DIAGNOSIS — D7589 Other specified diseases of blood and blood-forming organs: Secondary | ICD-10-CM

## 2018-01-02 DIAGNOSIS — R634 Abnormal weight loss: Secondary | ICD-10-CM | POA: Insufficient documentation

## 2018-01-02 DIAGNOSIS — I700 Atherosclerosis of aorta: Secondary | ICD-10-CM | POA: Diagnosis not present

## 2018-01-02 DIAGNOSIS — M898X9 Other specified disorders of bone, unspecified site: Secondary | ICD-10-CM | POA: Diagnosis not present

## 2018-01-02 LAB — POCT I-STAT CREATININE: Creatinine, Ser: 1.2 mg/dL — ABNORMAL HIGH (ref 0.44–1.00)

## 2018-01-02 MED ORDER — IOPAMIDOL (ISOVUE-300) INJECTION 61%
75.0000 mL | Freq: Once | INTRAVENOUS | Status: AC | PRN
  Administered 2018-01-02: 75 mL via INTRAVENOUS

## 2018-01-03 ENCOUNTER — Encounter: Payer: Self-pay | Admitting: Hematology and Oncology

## 2018-01-03 ENCOUNTER — Other Ambulatory Visit: Payer: Self-pay

## 2018-01-03 ENCOUNTER — Encounter: Payer: Self-pay | Admitting: *Deleted

## 2018-01-03 ENCOUNTER — Inpatient Hospital Stay: Payer: BLUE CROSS/BLUE SHIELD

## 2018-01-03 ENCOUNTER — Inpatient Hospital Stay (HOSPITAL_BASED_OUTPATIENT_CLINIC_OR_DEPARTMENT_OTHER): Payer: BLUE CROSS/BLUE SHIELD | Admitting: Hematology and Oncology

## 2018-01-03 VITALS — BP 143/77 | HR 72 | Temp 95.7°F | Resp 18 | Wt 88.5 lb

## 2018-01-03 DIAGNOSIS — E039 Hypothyroidism, unspecified: Secondary | ICD-10-CM

## 2018-01-03 DIAGNOSIS — I10 Essential (primary) hypertension: Secondary | ICD-10-CM

## 2018-01-03 DIAGNOSIS — K5909 Other constipation: Secondary | ICD-10-CM

## 2018-01-03 DIAGNOSIS — Z8719 Personal history of other diseases of the digestive system: Secondary | ICD-10-CM

## 2018-01-03 DIAGNOSIS — D7589 Other specified diseases of blood and blood-forming organs: Secondary | ICD-10-CM

## 2018-01-03 DIAGNOSIS — R970 Elevated carcinoembryonic antigen [CEA]: Secondary | ICD-10-CM | POA: Diagnosis not present

## 2018-01-03 DIAGNOSIS — R634 Abnormal weight loss: Secondary | ICD-10-CM

## 2018-01-03 DIAGNOSIS — I700 Atherosclerosis of aorta: Secondary | ICD-10-CM

## 2018-01-03 DIAGNOSIS — M25552 Pain in left hip: Secondary | ICD-10-CM

## 2018-01-03 DIAGNOSIS — K219 Gastro-esophageal reflux disease without esophagitis: Secondary | ICD-10-CM

## 2018-01-03 DIAGNOSIS — J439 Emphysema, unspecified: Secondary | ICD-10-CM

## 2018-01-03 DIAGNOSIS — M129 Arthropathy, unspecified: Secondary | ICD-10-CM

## 2018-01-03 DIAGNOSIS — M25551 Pain in right hip: Secondary | ICD-10-CM

## 2018-01-03 DIAGNOSIS — Z90722 Acquired absence of ovaries, bilateral: Secondary | ICD-10-CM

## 2018-01-03 DIAGNOSIS — D72829 Elevated white blood cell count, unspecified: Secondary | ICD-10-CM

## 2018-01-03 DIAGNOSIS — Z681 Body mass index (BMI) 19 or less, adult: Secondary | ICD-10-CM

## 2018-01-03 DIAGNOSIS — R61 Generalized hyperhidrosis: Secondary | ICD-10-CM

## 2018-01-03 DIAGNOSIS — E46 Unspecified protein-calorie malnutrition: Secondary | ICD-10-CM

## 2018-01-03 DIAGNOSIS — F17210 Nicotine dependence, cigarettes, uncomplicated: Secondary | ICD-10-CM

## 2018-01-03 DIAGNOSIS — Z8711 Personal history of peptic ulcer disease: Secondary | ICD-10-CM

## 2018-01-03 DIAGNOSIS — R918 Other nonspecific abnormal finding of lung field: Secondary | ICD-10-CM

## 2018-01-03 DIAGNOSIS — G8929 Other chronic pain: Secondary | ICD-10-CM | POA: Insufficient documentation

## 2018-01-03 DIAGNOSIS — N289 Disorder of kidney and ureter, unspecified: Secondary | ICD-10-CM

## 2018-01-03 DIAGNOSIS — R109 Unspecified abdominal pain: Secondary | ICD-10-CM

## 2018-01-03 LAB — CBC WITH DIFFERENTIAL/PLATELET
Basophils Absolute: 0.1 10*3/uL (ref 0–0.1)
Basophils Relative: 1 %
Eosinophils Absolute: 0.2 10*3/uL (ref 0–0.7)
Eosinophils Relative: 3 %
HCT: 38.9 % (ref 35.0–47.0)
Hemoglobin: 13.5 g/dL (ref 12.0–16.0)
Lymphocytes Relative: 16 %
Lymphs Abs: 1.3 10*3/uL (ref 1.0–3.6)
MCH: 36.2 pg — ABNORMAL HIGH (ref 26.0–34.0)
MCHC: 34.6 g/dL (ref 32.0–36.0)
MCV: 104.7 fL — ABNORMAL HIGH (ref 80.0–100.0)
Monocytes Absolute: 0.5 10*3/uL (ref 0.2–0.9)
Monocytes Relative: 6 %
Neutro Abs: 6.3 10*3/uL (ref 1.4–6.5)
Neutrophils Relative %: 74 %
Platelets: 274 10*3/uL (ref 150–440)
RBC: 3.72 MIL/uL — ABNORMAL LOW (ref 3.80–5.20)
RDW: 12.6 % (ref 11.5–14.5)
WBC: 8.3 10*3/uL (ref 3.6–11.0)

## 2018-01-03 LAB — COMPREHENSIVE METABOLIC PANEL
ALT: 12 U/L (ref 0–44)
AST: 20 U/L (ref 15–41)
Albumin: 4.3 g/dL (ref 3.5–5.0)
Alkaline Phosphatase: 77 U/L (ref 38–126)
Anion gap: 12 (ref 5–15)
BUN: 16 mg/dL (ref 6–20)
CO2: 24 mmol/L (ref 22–32)
Calcium: 9.8 mg/dL (ref 8.9–10.3)
Chloride: 104 mmol/L (ref 98–111)
Creatinine, Ser: 1.32 mg/dL — ABNORMAL HIGH (ref 0.44–1.00)
GFR calc Af Amer: 52 mL/min — ABNORMAL LOW (ref >60)
GFR calc non Af Amer: 44 mL/min — ABNORMAL LOW (ref >60)
Glucose, Bld: 123 mg/dL — ABNORMAL HIGH (ref 70–99)
Potassium: 4.6 mmol/L (ref 3.5–5.1)
Sodium: 140 mmol/L (ref 135–145)
Total Bilirubin: 0.4 mg/dL (ref 0.3–1.2)
Total Protein: 7.4 g/dL (ref 6.5–8.1)

## 2018-01-03 NOTE — Progress Notes (Signed)
Here for follow up. Per pt had CT scan yesterday of abd/ chest. Stated  that L lower quadrant abd pain continues- feels like" menstrual cramps" no bm x 11 day. Yesterday had 2 formed hard  bm and today had one bm.  Felt nauseous yesterday not today.  Related after ct -contrast made her nauseous per pt.

## 2018-01-03 NOTE — Progress Notes (Signed)
[facility] day:  01/03/2018  Chief Complaint: [name] is a 57 y.o. female with elevated tumor markers and weight loss who is seen for review of work-up and discussion regarding direction of therapy.  HPI:  The patient was last seen on 09/15/2017.  At that time, she noted improvement of her previously reported abdominal pain and bowel issues. Patient had no further vaginal bleeding. She noted minor post-coital bleeding.  Patient continued to lose weight.  She had lost in excess of 30-40 pounds in the past 1.5 years. Exam revealed no acute findings.  Work-up had included a normal chest, abdomen, and pelvic CT.  Upper and lower endoscopy were normal.  Mammogram was normal.  GYN exam was normal.  Bone scan on 12/28/2017 revealed no evidence of skeletal metastatic disease.  There was degenerative uptake in multiple locations.  Chest, abdomen, and pelvic CT from 01/02/2018 revealed no acute findings within the chest, abdomen or pelvis. No explanation for patient's weight loss.  There were 2 small nodules within the right lung which are new from previous exam. Nonspecific. No follow-up needed if patient is low-risk (and has no known or suspected primary neoplasm). Non-contrast chest CT can be considered in 12 months if patient is high-risk. Aortic atherosclerosis with multi vessel coronary artery atherosclerotic calcifications.  Emphysema.  CBC on 12/23/2017 revealed a hematocrit of 41.8, hemoglobin 14.3, MCV 106.4, platelets 258,000, WBC 8300 with an ANC of 5000.  TSH was 0.417 (0.45 - 4.5) with a free T4 of 4.4. Autoimmune panel was negative.  CEA was 9.5 (0-4.7).  Creatinine was 1.20.  During the interim, patient is experiencing diffuse abdominal pain and nausea. Pain is greater on the LEFT side of her abdomen. She notes that she has been more weak as of late. Pain rated 8/10 in clinic today. She is experiencing dyspareunia with (+) post-coital (bright  red) bleeding. She is post-menopausal. Patient continues to experiencing drenching night sweats that she believes may be post-menopausal. She remains constipated.   Patient is a former heavy alcohol user. She drank large amounts of beer, liquor, and moonshine. She stopped drinking heavily about 6 years ago. She drinks a glass of wine eery other day at this point.   Patient advises that she maintains an adequate appetite. She is eating well. Weight today is 88 lb 8 oz (40.1 kg), which compared to her last visit to the clinic, represents a 6 pound weight increase. Patient is drinking on bottle of  "the clear kind" of Ensure on a daily basis at this point.   Patient denies pain in the clinic today.   Past Medical History:  Diagnosis Date  . Arthritis    hands, back, knees  . Diverticulitis   . GERD (gastroesophageal reflux disease)   . Hypertension    somewhat controlled, taking medication  . Hypothyroidism     Past Surgical History:  Procedure Laterality Date  . ABDOMINAL HYSTERECTOMY    . APPENDECTOMY    . CHOLECYSTECTOMY    . COLONOSCOPY  01/09/2012   Procedure: COLONOSCOPY;  Surgeon: Lear Ng, MD;  Location: [facility] ENDOSCOPY;  Service: Endoscopy;  Laterality: N/A;  . COLONOSCOPY WITH PROPOFOL N/A 08/25/2017   Procedure: COLONOSCOPY WITH PROPOFOL;  Surgeon: Jonathon Bellows, MD;  Location: [facility] ENDOSCOPY;  Service: Gastroenterology;  Laterality: N/A;  . ESOPHAGOGASTRODUODENOSCOPY  01/07/2012   Procedure: ESOPHAGOGASTRODUODENOSCOPY (EGD);  Surgeon: Winfield Cunas., MD;  Location: [facility] ENDOSCOPY;  Service: Endoscopy;  Laterality:  N/A;  . ESOPHAGOGASTRODUODENOSCOPY (EGD) WITH PROPOFOL N/A 08/25/2017   Procedure: ESOPHAGOGASTRODUODENOSCOPY (EGD) WITH PROPOFOL;  Surgeon: Jonathon Bellows, MD;  Location: [facility] ENDOSCOPY;  Service: Gastroenterology;  Laterality: N/A;  . VIDEO BRONCHOSCOPY Bilateral 05/14/2016   Procedure: VIDEO BRONCHOSCOPY WITHOUT FLUORO;  Surgeon: Collene Gobble, MD;  Location:  [place];  Service: Cardiopulmonary;  Laterality: Bilateral;    Family History  Problem Relation Age of Onset  . Cancer Mother   . Breast cancer Mother        multiple types of cancer    Social History:  reports that she has been smoking cigarettes.  She has a 84.00 pack-year smoking history. She has never used smokeless tobacco. She reports that she drinks about 21.0 oz of alcohol per week. She reports that she does not use drugs. She previously smoked 2 packs/day x 42 years.  She cut back smoking 1 1/2 years ago.  She currently smokes 2 cigarettes/day.  Former heavy alcohol (beer) drinker; "I drank a case of beer every other day".  She also drank moonshine and liquor.  Patient currently drinks a single glass of wine every other day. Patient is employed at [employer] as a Clinical research associate. She has to lift up to 50 pounds.  Patient denies known exposures to radiation on toxins.  Lives with her boyfriend of 15 years. The patient is alone today.   Allergies: No Known Allergies  Current Medications: Current Outpatient Medications  Medication Sig Dispense Refill  . ALPRAZolam (XANAX) 0.5 MG tablet Take 1 tablet by mouth 3 (three) times daily.  2  . Biotin 5000 MCG TABS Take by mouth daily.    . Cholecalciferol (VITAMIN D-3) 5000 UNITS TABS Take 10,000 Units by mouth daily after breakfast.    . Cyanocobalamin (B-12) 5000 MCG CAPS Take by mouth daily.    . cyclobenzaprine (FLEXERIL) 10 MG tablet Take 1 tablet by mouth 3 (three) times daily.  1  . HYDROcodone-acetaminophen (NORCO/VICODIN) 5-325 MG per tablet Take 1 tablet by mouth every 4 (four) hours as needed for moderate pain. 15 tablet 0  . levothyroxine (SYNTHROID, LEVOTHROID) 50 MCG tablet Take 50 mcg by mouth daily before breakfast.    . pantoprazole (PROTONIX) 20 MG tablet Take 20 mg by mouth daily.   4  . Probiotic Product (PROBIOTIC-10 PO) Take by mouth daily.    . hyoscyamine (LEVBID) 0.375 MG 12 hr tablet Take 1 tablet by mouth 2 (two) times  daily.  1   No current facility-administered medications for this visit.     Review of Systems  Constitutional: Positive for diaphoresis (drenching night sweats) and malaise/fatigue. Negative for fever and weight loss (weight up 6 pounds).       "I am hurting. I am tired of feeling like this".   HENT: Negative.   Eyes: Negative.   Respiratory: Positive for shortness of breath (exertional). Negative for cough, hemoptysis and sputum production.   Cardiovascular: Negative for chest pain, palpitations, orthopnea, leg swelling and PND.  Gastrointestinal: Positive for constipation. Negative for abdominal pain, blood in stool, diarrhea, melena, nausea and vomiting.  Genitourinary: Negative for dysuria, frequency, hematuria and urgency.       Dyspareunia with (+) post-coital bleeding   Musculoskeletal: Positive for back pain. Negative for falls, joint pain and myalgias.  Skin: Negative for itching and rash.  Neurological: Negative for dizziness, tremors, weakness and headaches.  Endo/Heme/Allergies: Does not bruise/bleed easily.  Psychiatric/Behavioral: Negative for depression, memory loss and suicidal ideas. The patient is not nervous/anxious  and does not have insomnia.   All other systems reviewed and are negative.  Performance status (ECOG): 1 - Symptomatic but completely ambulatory  Vital Signs BP (!) 143/77 (BP Location: Left Arm, Patient Position: Sitting)   Pulse 72   Temp (!) 95.7 F (35.4 C) (Tympanic)   Resp 18   Wt 88 lb 8 oz (40.1 kg)   BMI 17.87 kg/m   Physical Exam  Constitutional: She is oriented to person, place, and time and well-developed, well-nourished, and in no distress.  HENT:  Head: Normocephalic and atraumatic.  Eyes: Pupils are equal, round, and reactive to light. EOM are normal. No scleral icterus.  Neck: Normal range of motion. Neck supple. No tracheal deviation present. No thyromegaly present.  Cardiovascular: Normal rate, regular rhythm and normal heart  sounds. Exam reveals no gallop and no friction rub.  No murmur heard. Pulmonary/Chest: Effort normal and breath sounds normal. No respiratory distress. She has no wheezes. She has no rales.  Abdominal: Soft. Bowel sounds are normal. She exhibits no distension and no mass. There is tenderness. There is no rebound and no guarding.  Scaphoid abdomen  Musculoskeletal: Normal range of motion. She exhibits tenderness (Mild LEFT neck). She exhibits no edema.  Lymphadenopathy:    She has no cervical adenopathy.    She has no axillary adenopathy.       Right: No inguinal and no supraclavicular adenopathy present.       Left: No inguinal and no supraclavicular adenopathy present.  Neurological: She is alert and oriented to person, place, and time.  Skin: Skin is warm and dry. No rash noted. No erythema.  Psychiatric: Mood, affect and judgment normal.  Nursing note and vitals reviewed.   Orders Only on 01/03/2018  Component Date Value Ref Range Status  . Sodium 01/03/2018 140  135 - 145 mmol/L Final  . Potassium 01/03/2018 4.6  3.5 - 5.1 mmol/L Final  . Chloride 01/03/2018 104  98 - 111 mmol/L Final  . CO2 01/03/2018 24  22 - 32 mmol/L Final  . Glucose, Bld 01/03/2018 123* 70 - 99 mg/dL Final  . BUN 01/03/2018 16  6 - 20 mg/dL Final  . Creatinine, Ser 01/03/2018 1.32* 0.44 - 1.00 mg/dL Final  . Calcium 01/03/2018 9.8  8.9 - 10.3 mg/dL Final  . Total Protein 01/03/2018 7.4  6.5 - 8.1 g/dL Final  . Albumin 01/03/2018 4.3  3.5 - 5.0 g/dL Final  . AST 01/03/2018 20  15 - 41 U/L Final  . ALT 01/03/2018 12  0 - 44 U/L Final  . Alkaline Phosphatase 01/03/2018 77  38 - 126 U/L Final  . Total Bilirubin 01/03/2018 0.4  0.3 - 1.2 mg/dL Final  . GFR calc non Af Amer 01/03/2018 44* >60 mL/min Final  . GFR calc Af Amer 01/03/2018 52* >60 mL/min Final   Comment: (NOTE) The eGFR has been calculated using the CKD EPI equation. This calculation has not been validated in all clinical situations. eGFR's  persistently <60 mL/min signify possible Chronic Kidney Disease.   Georgiann Hahn gap 01/03/2018 12  5 - 15 Final   Performed at [facility], [address] [place], [place] [address]  . WBC 01/03/2018 8.3  3.6 - 11.0 K/uL Final  . RBC 01/03/2018 3.72* 3.80 - 5.20 MIL/uL Final  . Hemoglobin 01/03/2018 13.5  12.0 - 16.0 g/dL Final  . HCT 01/03/2018 38.9  35.0 - 47.0 % Final  . MCV 01/03/2018 104.7* 80.0 - 100.0 fL Final  .  MCH 01/03/2018 36.2* 26.0 - 34.0 pg Final  . MCHC 01/03/2018 34.6  32.0 - 36.0 g/dL Final  . RDW 01/03/2018 12.6  11.5 - 14.5 % Final  . Platelets 01/03/2018 274  150 - 440 K/uL Final  . Neutrophils Relative % 01/03/2018 74  % Final  . Neutro Abs 01/03/2018 6.3  1.4 - 6.5 K/uL Final  . Lymphocytes Relative 01/03/2018 16  % Final  . Lymphs Abs 01/03/2018 1.3  1.0 - 3.6 K/uL Final  . Monocytes Relative 01/03/2018 6  % Final  . Monocytes Absolute 01/03/2018 0.5  0.2 - 0.9 K/uL Final  . Eosinophils Relative 01/03/2018 3  % Final  . Eosinophils Absolute 01/03/2018 0.2  0 - 0.7 K/uL Final  . Basophils Relative 01/03/2018 1  % Final  . Basophils Absolute 01/03/2018 0.1  0 - 0.1 K/uL Final   Performed at [facility], [address] [place], [place] [address]  Hospital Outpatient Visit on 01/02/2018  Component Date Value Ref Range Status  . Creatinine, Ser 01/02/2018 1.20* 0.44 - 1.00 mg/dL Final    Assessment:  [name] is a 57 y.o. female with a 2 year history of weight loss and an elevated CEA.  She has a > 50-pack-year smoking history.  She has a history of heavy alcohol use > 5 years ago.  She has macrocytic RBC indices.  CEA has been followed: 6.6 in 2017, 13.87 on 01/21/2016, 12.61 on 04/08/2016, 11.4 on 07/14/2017, and 9.5 on 12/23/2017.  Work-up in 2017 included the following negative studies: hepatitis, HIV testing,TSH, and celiac testing were negative.  SPEP was negative in 01/2017.  Work-up in 07/2017 revealed the following normal studies:  TSH, ANA, RPR, C-reactive protein.  Calcium and creatinine were elevated.  PTH-rp was < 2.0 on 08/15/2017.  Free T4 was 1.22 (slightly elevated) on 08/15/2017.  Chest, abdomen, and pelvic CT on 01/27/2016 revealed multiple ill-defined pulmonary nodules up to 8 mm.  There was no mediastinal adenopathy.  There was no mass or adenopathy in the abdomen or pelvis.  PET scan on 02/06/2016 revealed ground glass pulmonary nodules up to 8 mm (max SUV 1.2), possibly inflammatory or neoplastic.  Chest CT on 04/08/2016 revealed predominant upper lobe areas of peribronchial peribronchovascular nodularity.  Many nodules had resolved and there were several new nodules.  Findings were most likely related to an inflammatory or infectious process.  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   Bronchoscopy on 05/14/2016 revealed no endobronchial lesions.  Epiglottis biopsy revealed squamous papilloma.  Weight eventually improved to 103 pounds.  CXR on 07/18/2017 revealed clear lungs.  Abdomen and pelvic CT on 08/03/2017 revealed no CT evidence for acute intra-abdominal or pelvic abnormality.  Chest CT on 08/18/2017 revealed no new or worrisome pulmonary findings. There was stable emphysematous changes and no mediastinal or hilar mass or adenopathy.  Bone scan on 12/28/2017 revealed no evidence of skeletal metastatic disease.    Chest, abdomen, and pelvic CT on 01/02/2018 revealed no acute findings within the chest, abdomen or pelvis.  No explanation for patient's weight loss.  There were 2 small nodules within the right lung which are new from previous exam (nonspecific).   EGD on 01/07/2012 revealed a normal esophagus and duodenum.  There was a small antral gastric ulcer.  EGD on 08/25/2017 was normal.  Gastric biopsy revealed features of a healing mucosal injury.  There was no H pylori, dysplasia or malignancy.    Colonoscopy on 01/09/2012 revealed diffuse diverticulosis,  medium size internal  hemorrhoids, and a small polyp s/p snare cautery (no path obtained).  Colonoscopy on 08/25/2017 revealed diverticulosis in the entire colon.  There was localized inflammation at the hepatic flexure secondary to colitis.  Biopsy revealed mild active inflammation with superficial crypt drop out and fibrosis/hyalinization of the lamina propria, negative for dysplasia or malignancy.  She describes one episode of vaginal bleeding when she got out of the shower which covered a towel.  She is s/p bilateral oophorectomy, but is unsure if her uterus remains.  Mammogram on 07/07/2015 revealed no evidence of malignancy.  Digital screening mammogram on 09/14/2017 revealed no evidence of malignancy.  Symptomatically, she has chronic abdominal pain.  She has apparent vasomotor symptoms (sweats).  She has renal insufficiency. She has gained 8 pounds.  Exam is stable.  Plan: 1. Review interval testing.  CT scans and bone scan personally reviewed.  No evidence of malignancy. 2. Labs today: FSH, estradiol, HIV Ab. 3. Discuss abdominal pain.  She has chronic constipation.  Will refer bask to GI (Dr.  Vicente Males).  4. Renal insufficiency - CrCl 30.5 mL/min. Etiology unclear.  Will refer to nephrology.  5. Protein calorie malnutrition -  Her weight today is 88 lb 8 oz (40.1 kg). Her BMI of 17.87 kg/m places her in an underweight category. Encouraged her to increase her intake of calorie and protein dense food choices. Additionally, patient was encouraged to utilize nutritional supplement shakes at least 2-3 times a day.  6. RTC in 3 months for MD assessment and labs (CBC with diff, CMP).    Honor Loh, NP  01/03/2018, 11:47 AM   I saw and evaluated the patient, participating in the key portions of the service and reviewing pertinent diagnostic studies and records.  I reviewed the nurse practitioner's note and agree with the findings and the plan.  The assessment and plan were discussed with the patient.  Several questions  were asked by the patient and answered.   Nolon Stalls, MD 01/03/18, 11:47 AM

## 2018-01-04 LAB — FOLLICLE STIMULATING HORMONE: FSH: 26.7 m[IU]/mL

## 2018-01-04 LAB — ESTRADIOL: Estradiol: 28 pg/mL

## 2018-01-04 LAB — HIV ANTIBODY (ROUTINE TESTING W REFLEX): HIV Screen 4th Generation wRfx: NONREACTIVE

## 2018-01-05 ENCOUNTER — Inpatient Hospital Stay: Payer: BLUE CROSS/BLUE SHIELD | Attending: Hematology and Oncology

## 2018-01-05 NOTE — Progress Notes (Signed)
Nutrition  Patient was a no show for nutrition appointment today.    Jonnette Nuon B. Melania Kirks, RD, LDN Registered Dietitian [phone] (pager)   

## 2018-02-07 ENCOUNTER — Ambulatory Visit: Payer: BLUE CROSS/BLUE SHIELD | Admitting: Gastroenterology

## 2018-02-07 ENCOUNTER — Encounter: Payer: Self-pay | Admitting: Gastroenterology

## 2018-02-07 ENCOUNTER — Other Ambulatory Visit: Payer: Self-pay

## 2018-02-07 VITALS — BP 151/81 | HR 74 | Ht 59.0 in | Wt 90.0 lb

## 2018-02-07 DIAGNOSIS — K5900 Constipation, unspecified: Secondary | ICD-10-CM

## 2018-02-07 DIAGNOSIS — K8020 Calculus of gallbladder without cholecystitis without obstruction: Secondary | ICD-10-CM

## 2018-02-07 NOTE — Progress Notes (Addendum)
Jonathon Bellows MD, MRCP(U.K) [address]  [place]  [place], [place] [address]  Main: [phone]  Fax: [phone]   Primary Care Physician: Alvester Chou, NP  Primary Gastroenterologist:  Dr. Jonathon Bellows   No chief complaint on file.   HPI: [name] is a 57 y.o. female  Summary of history : She was initially seen in 08/2017 for weight loss .  Over 50 lbs lost since 2017 . 50 pack year of smoking . PET scan in 2017 showed lung nodules. Elevated CEA Negative TTG antibody for celiac disease  In 01/2017 .HIV,RPR negative. CT scan of the abdomen 07/2017- no gross abnormalities. CT scan of the chest performed today shows no new pulmonary lesions . On b12 supplements. Mother had multiple cancers. Albumin 4.5:  08/2017 .    Weighed 92 lbs in 01/2016 .  Occasional abdominal - 3 times a day , each episode lasts around 10 minutes, middle of the abdomen , goes to her back. She sees a pain doctor for her back, takes hydrocodone . Apetite is good - has dinner, not much for breakfast or lunch and she does not eat as she " has no time to eat ", says she does feel hungry when she has no time. Does not drink any alcohol. No weight loss supplements, ocasional laxative use. No rectal bleeding .   Takes Bc powder daily.   Interval history   08/08/2017-  02/07/2018  08/25/17: EGD+colonoscopy  -normal , bx taken from stomach and hepatic flexure showed mild acute inflammation likely secondary to nsaid use. No H pylori.  08/2017 : CRP/ESR/celiac/HIV normal  CT chest abdomen and pelvis in 12/2017 showed no cute findings .CBD dilation to 11 mm .   Weighs 90 lbs today - was 82 lbs in 08/2017   Today referred back for constipation  She says that she has a bowel movement every 3 days - very hard - hurts to have a bowel movement, Says she eats a lot of greens to have a bowel movement .      Current Outpatient Medications  Medication Sig Dispense Refill  . ALPRAZolam (XANAX) 0.5 MG tablet Take 1  tablet by mouth 3 (three) times daily.  2  . Biotin 5000 MCG TABS Take by mouth daily.    . Cholecalciferol (VITAMIN D-3) 5000 UNITS TABS Take 10,000 Units by mouth daily after breakfast.    . Cyanocobalamin (B-12) 5000 MCG CAPS Take by mouth daily.    . cyclobenzaprine (FLEXERIL) 10 MG tablet Take 1 tablet by mouth 3 (three) times daily.  1  . HYDROcodone-acetaminophen (NORCO/VICODIN) 5-325 MG per tablet Take 1 tablet by mouth every 4 (four) hours as needed for moderate pain. 15 tablet 0  . hyoscyamine (LEVBID) 0.375 MG 12 hr tablet Take 1 tablet by mouth 2 (two) times daily.  1  . levothyroxine (SYNTHROID, LEVOTHROID) 50 MCG tablet Take 50 mcg by mouth daily before breakfast.    . pantoprazole (PROTONIX) 20 MG tablet Take 20 mg by mouth daily.   4  . Probiotic Product (PROBIOTIC-10 PO) Take by mouth daily.     No current facility-administered medications for this visit.     Allergies as of 02/07/2018  . (No Known Allergies)    ROS:  General: Negative for anorexia, weight loss, fever, chills, fatigue, weakness. ENT: Negative for hoarseness, difficulty swallowing , nasal congestion. CV: Negative for chest pain, angina, palpitations, dyspnea on exertion, peripheral edema.  Respiratory: Negative for dyspnea at rest,  dyspnea on exertion, cough, sputum, wheezing.  GI: See history of present illness. GU:  Negative for dysuria, hematuria, urinary incontinence, urinary frequency, nocturnal urination.  Endo: Negative for unusual weight change.    Physical Examination:   There were no vitals taken for this visit.  General: Well-nourished, well-developed in no acute distress.  Eyes: No icterus. Conjunctivae pink. Mouth: Oropharyngeal mucosa moist and pink , no lesions erythema or exudate. Lungs: Clear to auscultation bilaterally. Non-labored. Heart: Regular rate and rhythm, no murmurs rubs or gallops.  Abdomen: Bowel sounds are normal, nontender, nondistended, no hepatosplenomegaly or  masses, no abdominal bruits or hernia , no rebound or guarding.   Extremities: No lower extremity edema. No clubbing or deformities. Neuro: Alert and oriented x 3.  Grossly intact. Skin: Warm and dry, no jaundice.   Psych: Alert and cooperative, normal mood and affect.   Imaging Studies: No results found.  Assessment and Plan:   [name] is a 57 y.o. y/o female here to see me for constipation, last seen in 08/2017  for unintentional weight loss. Issues have been ongoing since 2017 and in the past cancer markers were checked and have been elevated. Had been on long term NSAID's which she has stopped.Marland Kitchen Extensive work up by Dr Mike Gip for weight loss has been negative so far. Stable dilated CBD to 11 mm ,s/p cholecystectomy-LFT's normal .On hydrocodone . Gained weight since last visit   Plan  1.High fiber diet  2 . Trial of Amitiza 16 mcg - 2 weeks samples- if it works call me back and I will send a script to the pharmacy IF it causes diarrhea- call me and I will decrease the dose to 8 mcg .  3. 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 Normal is 6 mm or less. 11 mm is almost double .    Dr Jonathon Bellows  MD,MRCP [facility]) Follow up PRN

## 2018-02-07 NOTE — Patient Instructions (Signed)
High-Fiber Diet  Fiber, also called dietary fiber, is a type of carbohydrate found in fruits, vegetables, whole grains, and beans. A high-fiber diet can have many health benefits. Your health care provider may recommend a high-fiber diet to help:  · Prevent constipation. Fiber can make your bowel movements more regular.  · Lower your cholesterol.  · Relieve hemorrhoids, uncomplicated diverticulosis, or irritable bowel syndrome.  · Prevent overeating as part of a weight-loss plan.  · Prevent heart disease, type 2 diabetes, and certain cancers.    What is my plan?  The recommended daily intake of fiber includes:  · 38 grams for men under age 50.  · 30 grams for men over age 50.  · 25 grams for women under age 50.  · 21 grams for women over age 50.    You can get the recommended daily intake of dietary fiber by eating a variety of fruits, vegetables, grains, and beans. Your health care provider may also recommend a fiber supplement if it is not possible to get enough fiber through your diet.  What do I need to know about a high-fiber diet?  · Fiber supplements have not been widely studied for their effectiveness, so it is better to get fiber through food sources.  · Always check the fiber content on the nutrition facts label of any prepackaged food. Look for foods that contain at least 5 grams of fiber per serving.  · Ask your dietitian if you have questions about specific foods that are related to your condition, especially if those foods are not listed in the following section.  · Increase your daily fiber consumption gradually. Increasing your intake of dietary fiber too quickly may cause bloating, cramping, or gas.  · Drink plenty of water. Water helps you to digest fiber.  What foods can I eat?  Grains  Whole-grain breads. Multigrain cereal. Oats and oatmeal. Brown rice. Barley. Bulgur wheat. Millet. Bran muffins. Popcorn. Rye wafer crackers.  Vegetables   Sweet potatoes. Spinach. Kale. Artichokes. Cabbage. Broccoli. Green peas. Carrots. Squash.  Fruits  Berries. Pears. Apples. Oranges. Avocados. Prunes and raisins. Dried figs.  Meats and Other Protein Sources  Navy, kidney, pinto, and soy beans. Split peas. Lentils. Nuts and seeds.  Dairy  Fiber-fortified yogurt.  Beverages  Fiber-fortified soy milk. Fiber-fortified orange juice.  Other  Fiber bars.  The items listed above may not be a complete list of recommended foods or beverages. Contact your dietitian for more options.  What foods are not recommended?  Grains  White bread. Pasta made with refined flour. White rice.  Vegetables  Fried potatoes. Canned vegetables. Well-cooked vegetables.  Fruits  Fruit juice. Cooked, strained fruit.  Meats and Other Protein Sources  Fatty cuts of meat. Fried poultry or fried fish.  Dairy  Milk. Yogurt. Cream cheese. Sour cream.  Beverages  Soft drinks.  Other  Cakes and pastries. Butter and oils.  The items listed above may not be a complete list of foods and beverages to avoid. Contact your dietitian for more information.  What are some tips for including high-fiber foods in my diet?  · Eat a wide variety of high-fiber foods.  · Make sure that half of all grains consumed each day are whole grains.  · Replace breads and cereals made from refined flour or white flour with whole-grain breads and cereals.  · Replace white rice with brown rice, bulgur wheat, or millet.  · Start the day with a breakfast that is high in fiber,   such as a cereal that contains at least 5 grams of fiber per serving.  · Use beans in place of meat in soups, salads, or pasta.  · Eat high-fiber snacks, such as berries, raw vegetables, nuts, or popcorn.  This information is not intended to replace advice given to you by your health care provider. Make sure you discuss any questions you have with your health care provider.  Document Released: 05/24/2005 Document Revised: 10/30/2015 Document Reviewed: 11/06/2013   Elsevier Interactive Patient Education © 2018 Elsevier Inc.

## 2018-02-16 ENCOUNTER — Telehealth: Payer: Self-pay | Admitting: Gastroenterology

## 2018-02-16 NOTE — Telephone Encounter (Signed)
[facility] calling for precert for pt MRI 3/88/87

## 2018-02-21 ENCOUNTER — Ambulatory Visit: Payer: BLUE CROSS/BLUE SHIELD

## 2018-03-07 ENCOUNTER — Ambulatory Visit: Admission: RE | Admit: 2018-03-07 | Payer: BLUE CROSS/BLUE SHIELD | Source: Ambulatory Visit

## 2018-03-07 ENCOUNTER — Telehealth: Payer: Self-pay | Admitting: Gastroenterology

## 2018-03-07 NOTE — Telephone Encounter (Signed)
[facility] dept. Called to inform us that Pt No Showed MRI

## 2018-03-09 ENCOUNTER — Other Ambulatory Visit: Payer: Self-pay | Admitting: Gastroenterology

## 2018-03-09 ENCOUNTER — Other Ambulatory Visit: Payer: Self-pay

## 2018-03-09 MED ORDER — LUBIPROSTONE 24 MCG PO CAPS: 24.0000 ug | ORAL_CAPSULE | Freq: Two times a day (BID) | ORAL | 3 refills | Status: DC

## 2018-03-09 NOTE — Telephone Encounter (Signed)
Left vm informing pt rx for Amitiza 56mcg has been sent to [facility], [place].

## 2018-03-09 NOTE — Telephone Encounter (Signed)
Pt is calling to let  Dr. Vicente Males know the samples are working  And would like rx send to [facility] on [address] in [place] and that her MRI was approved by insurance

## 2018-03-10 ENCOUNTER — Other Ambulatory Visit: Payer: Self-pay | Admitting: Gastroenterology

## 2018-03-10 DIAGNOSIS — K819 Cholecystitis, unspecified: Secondary | ICD-10-CM

## 2018-03-15 ENCOUNTER — Ambulatory Visit
Admission: RE | Admit: 2018-03-15 | Discharge: 2018-03-15 | Disposition: A | Discharge status: Home / Self Care | Payer: BLUE CROSS/BLUE SHIELD | Source: Ambulatory Visit | Attending: Gastroenterology | Admitting: Gastroenterology

## 2018-03-15 DIAGNOSIS — K838 Other specified diseases of biliary tract: Secondary | ICD-10-CM | POA: Insufficient documentation

## 2018-03-15 DIAGNOSIS — K8020 Calculus of gallbladder without cholecystitis without obstruction: Secondary | ICD-10-CM | POA: Diagnosis not present

## 2018-03-15 DIAGNOSIS — K5730 Diverticulosis of large intestine without perforation or abscess without bleeding: Secondary | ICD-10-CM | POA: Diagnosis not present

## 2018-03-15 DIAGNOSIS — K819 Cholecystitis, unspecified: Secondary | ICD-10-CM

## 2018-03-15 LAB — POCT I-STAT CREATININE: Creatinine, Ser: 1.4 mg/dL — ABNORMAL HIGH (ref 0.44–1.00)

## 2018-03-15 MED ORDER — GADOBENATE DIMEGLUMINE 529 MG/ML IV SOLN
10.0000 mL | Freq: Once | INTRAVENOUS | Status: AC | PRN
  Administered 2018-03-15: 4 mL via INTRAVENOUS

## 2018-03-19 ENCOUNTER — Encounter: Payer: Self-pay | Admitting: Gastroenterology

## 2018-03-24 ENCOUNTER — Telehealth: Payer: Self-pay

## 2018-03-24 NOTE — Telephone Encounter (Signed)
Called pt to inform her of MRCP results. LVM to return call 

## 2018-03-24 NOTE — Telephone Encounter (Signed)
Spoke with pt and informed her of MRCP results. Pt requests advice on what Dr. Vicente Males suggests are the next steps for evaluating her condition. I explained I will consult with Dr. Vicente Males and then advise.

## 2018-03-24 NOTE — Telephone Encounter (Signed)
-----   Message from Jonathon Bellows, MD sent at 03/19/2018  2:07 PM EDT ----- [name] please inform patient that the MRCP shows no new changes-everything is stable   C.c Dr Mike Gip   Dr Jonathon Bellows MD,MRCP [facility]) Gastroenterology/Hepatology Pager: [phone]

## 2018-03-27 NOTE — Telephone Encounter (Signed)
Spoke with pt regarding her request for advice. I informed her Dr. Vicente Males suggests that she see him in the office to address her questions and concerns and to discuss results. Pt has been scheduled.

## 2018-04-07 ENCOUNTER — Inpatient Hospital Stay: Payer: BLUE CROSS/BLUE SHIELD | Admitting: Hematology and Oncology

## 2018-04-07 ENCOUNTER — Inpatient Hospital Stay: Payer: BLUE CROSS/BLUE SHIELD

## 2018-04-07 NOTE — Progress Notes (Deleted)
[facility] day:  04/07/2018  Chief Complaint: [name] is a 57 y.o. female with elevated tumor markers and weight loss who is seen for 3 month assessment.  HPI:  The patient was last seen on 01/03/2018.  At that time,  she had chronic abdominal pain.  She had apparent vasomotor symptoms (sweats).  She had renal insufficiency. She had gained 8 pounds.  Exam was stable.  HIV testing was negative.  We discussed referral to nephrology, gastroenterology, and nutrition.  She missed her appointment with Jennet Maduro, RD on 01/05/2018. FSH and estradiol were c/w post-menopausal state.  She saw Dr. Jonathon Bellows on 02/07/2018.  Notes reviewed. A trial of Amitiza was recommended.  She received 2 week samples.  MRCP to evaluate dilated common duct was recommended.  MRCP on 03/15/2018 revealed mild biliary ductal dilatation, unchanged compared to 2016 exam.  There was no evidence of choledocholithiasis or other obstructing etiology.  During the interim,    Past Medical History:  Diagnosis Date  . Arthritis    hands, back, knees  . Diverticulitis   . GERD (gastroesophageal reflux disease)   . Hypertension    somewhat controlled, taking medication  . Hypothyroidism     Past Surgical History:  Procedure Laterality Date  . ABDOMINAL HYSTERECTOMY    . APPENDECTOMY    . CHOLECYSTECTOMY    . COLONOSCOPY  01/09/2012   Procedure: COLONOSCOPY;  Surgeon: Lear Ng, MD;  Location: [facility] ENDOSCOPY;  Service: Endoscopy;  Laterality: N/A;  . COLONOSCOPY WITH PROPOFOL N/A 08/25/2017   Procedure: COLONOSCOPY WITH PROPOFOL;  Surgeon: Jonathon Bellows, MD;  Location: [facility] ENDOSCOPY;  Service: Gastroenterology;  Laterality: N/A;  . ESOPHAGOGASTRODUODENOSCOPY  01/07/2012   Procedure: ESOPHAGOGASTRODUODENOSCOPY (EGD);  Surgeon: Winfield Cunas., MD;  Location: [facility] ENDOSCOPY;  Service: Endoscopy;  Laterality: N/A;  . ESOPHAGOGASTRODUODENOSCOPY (EGD) WITH PROPOFOL  N/A 08/25/2017   Procedure: ESOPHAGOGASTRODUODENOSCOPY (EGD) WITH PROPOFOL;  Surgeon: Jonathon Bellows, MD;  Location: [facility] ENDOSCOPY;  Service: Gastroenterology;  Laterality: N/A;  . VIDEO BRONCHOSCOPY Bilateral 05/14/2016   Procedure: VIDEO BRONCHOSCOPY WITHOUT FLUORO;  Surgeon: Collene Gobble, MD;  Location: [place] [place];  Service: Cardiopulmonary;  Laterality: Bilateral;    Family History  Problem Relation Age of Onset  . Cancer Mother   . Breast cancer Mother        multiple types of cancer    Social History:  reports that she has been smoking cigarettes. She has a 84.00 pack-year smoking history. She has never used smokeless tobacco. She reports that she drinks about 35.0 standard drinks of alcohol per week. She reports that she does not use drugs. She previously smoked 2 packs/day x 42 years.  She cut back smoking 1 1/2 years ago.  She currently smokes 2 cigarettes/day.  Former heavy alcohol (beer) drinker; "I drank a case of beer every other day".  She also drank moonshine and liquor.  Patient currently drinks a single glass of wine every other day. Patient is employed at [employer] as a Clinical research associate. She has to lift up to 50 pounds.  Patient denies known exposures to radiation on toxins.  Lives with her boyfriend of 15 years. The patient is alone today.   Allergies: No Known Allergies  Current Medications: Current Outpatient Medications  Medication Sig Dispense Refill  . ALPRAZolam (XANAX) 0.5 MG tablet Take 1 tablet by mouth 3 (three) times daily.  2  . Biotin 5000 MCG TABS Take by mouth daily.    Marland Kitchen  Cholecalciferol (VITAMIN D-3) 5000 UNITS TABS Take 10,000 Units by mouth daily after breakfast.    . Cyanocobalamin (B-12) 5000 MCG CAPS Take by mouth daily.    . cyclobenzaprine (FLEXERIL) 10 MG tablet Take 1 tablet by mouth 3 (three) times daily.  1  . HYDROcodone-acetaminophen (NORCO/VICODIN) 5-325 MG per tablet Take 1 tablet by mouth every 4 (four) hours as needed for moderate pain. 15 tablet 0  .  hyoscyamine (LEVBID) 0.375 MG 12 hr tablet Take 1 tablet by mouth 2 (two) times daily.  1  . levothyroxine (SYNTHROID, LEVOTHROID) 50 MCG tablet Take 50 mcg by mouth daily before breakfast.    . lubiprostone (AMITIZA) 24 MCG capsule Take 1 capsule (24 mcg total) by mouth 2 (two) times daily with a meal. 60 capsule 3  . pantoprazole (PROTONIX) 20 MG tablet Take 20 mg by mouth daily.   4  . Probiotic Product (PROBIOTIC-10 PO) Take by mouth daily.     No current facility-administered medications for this visit.     Review of Systems  Constitutional: Positive for diaphoresis (drenching night sweats) and malaise/fatigue. Negative for fever and weight loss (weight up 6 pounds).       "I am hurting. I am tired of feeling like this".   HENT: Negative.   Eyes: Negative.   Respiratory: Positive for shortness of breath (exertional). Negative for cough, hemoptysis and sputum production.   Cardiovascular: Negative for chest pain, palpitations, orthopnea, leg swelling and PND.  Gastrointestinal: Positive for constipation. Negative for abdominal pain, blood in stool, diarrhea, melena, nausea and vomiting.  Genitourinary: Negative for dysuria, frequency, hematuria and urgency.       Dyspareunia with (+) post-coital bleeding   Musculoskeletal: Positive for back pain. Negative for falls, joint pain and myalgias.  Skin: Negative for itching and rash.  Neurological: Negative for dizziness, tremors, weakness and headaches.  Endo/Heme/Allergies: Does not bruise/bleed easily.  Psychiatric/Behavioral: Negative for depression, memory loss and suicidal ideas. The patient is not nervous/anxious and does not have insomnia.   All other systems reviewed and are negative.  Performance status (ECOG): 1 - Symptomatic but completely ambulatory  Vital Signs There were no vitals taken for this visit.  Physical Exam  Constitutional: She is oriented to person, place, and time and well-developed, well-nourished, and in no  distress.  HENT:  Head: Normocephalic and atraumatic.  Eyes: Pupils are equal, round, and reactive to light. EOM are normal. No scleral icterus.  Neck: Normal range of motion. Neck supple. No tracheal deviation present. No thyromegaly present.  Cardiovascular: Normal rate, regular rhythm and normal heart sounds. Exam reveals no gallop and no friction rub.  No murmur heard. Pulmonary/Chest: Effort normal and breath sounds normal. No respiratory distress. She has no wheezes. She has no rales.  Abdominal: Soft. Bowel sounds are normal. She exhibits no distension and no mass. There is tenderness. There is no rebound and no guarding.  Scaphoid abdomen  Musculoskeletal: Normal range of motion. She exhibits tenderness (Mild LEFT neck). She exhibits no edema.  Lymphadenopathy:    She has no cervical adenopathy.    She has no axillary adenopathy.       Right: No inguinal and no supraclavicular adenopathy present.       Left: No inguinal and no supraclavicular adenopathy present.  Neurological: She is alert and oriented to person, place, and time.  Skin: Skin is warm and dry. No rash noted. No erythema.  Psychiatric: Mood, affect and judgment normal.  Nursing note and vitals  reviewed.   No visits with results within 3 Day(s) from this visit.  Latest known visit with results is:  Hospital Outpatient Visit on 03/15/2018  Component Date Value Ref Range Status  . Creatinine, Ser 03/15/2018 1.40* 0.44 - 1.00 mg/dL Final    Assessment:  [name] is a 57 y.o. female with a 2 year history of weight loss and an elevated CEA.  She has a > 50-pack-year smoking history.  She has a history of heavy alcohol use > 5 years ago.  She has macrocytic RBC indices.  CEA has been followed: 6.6 in 2017, 13.87 on 01/21/2016, 12.61 on 04/08/2016, 11.4 on 07/14/2017, and 9.5 on 12/23/2017.  Work-up in 2017 included the following negative studies: hepatitis, HIV testing,TSH, and celiac testing were negative.   SPEP was negative in 01/2017.  Work-up in 07/2017 revealed the following normal studies: TSH, ANA, RPR, C-reactive protein.  Calcium and creatinine were elevated.  PTH-rp was < 2.0 on 08/15/2017.  Free T4 was 1.22 (slightly elevated) on 08/15/2017.  Chest, abdomen, and pelvic CT on 01/27/2016 revealed multiple ill-defined pulmonary nodules up to 8 mm.  There was no mediastinal adenopathy.  There was no mass or adenopathy in the abdomen or pelvis.  PET scan on 02/06/2016 revealed ground glass pulmonary nodules up to 8 mm (max SUV 1.2), possibly inflammatory or neoplastic.  Chest CT on 04/08/2016 revealed predominant upper lobe areas of peribronchial peribronchovascular nodularity.  Many nodules had resolved and there were several new nodules.  Findings were most likely related to an inflammatory or infectious process.  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   Bronchoscopy on 05/14/2016 revealed no endobronchial lesions.  Epiglottis biopsy revealed squamous papilloma.  Weight eventually improved to 103 pounds.  CXR on 07/18/2017 revealed clear lungs.  Abdomen and pelvic CT on 08/03/2017 revealed no CT evidence for acute intra-abdominal or pelvic abnormality.  Chest CT on 08/18/2017 revealed no new or worrisome pulmonary findings. There was stable emphysematous changes and no mediastinal or hilar mass or adenopathy.  Bone scan on 12/28/2017 revealed no evidence of skeletal metastatic disease.    Chest, abdomen, and pelvic CT on 01/02/2018 revealed no acute findings within the chest, abdomen or pelvis.  No explanation for patient's weight loss.  There were 2 small nodules within the right lung which are new from previous exam (nonspecific).   EGD on 01/07/2012 revealed a normal esophagus and duodenum.  There was a small antral gastric ulcer.  EGD on 08/25/2017 was normal.  Gastric biopsy revealed features of a healing mucosal injury.  There was no H pylori, dysplasia or malignancy.     Colonoscopy on 01/09/2012 revealed diffuse diverticulosis, medium size internal hemorrhoids, and a small polyp s/p snare cautery (no path obtained).  Colonoscopy on 08/25/2017 revealed diverticulosis in the entire colon.  There was localized inflammation at the hepatic flexure secondary to colitis.  Biopsy revealed mild active inflammation with superficial crypt drop out and fibrosis/hyalinization of the lamina propria, negative for dysplasia or malignancy.  She describes one episode of vaginal bleeding when she got out of the shower which covered a towel.  She is s/p bilateral oophorectomy, but is unsure if her uterus remains.  Mammogram on 07/07/2015 revealed no evidence of malignancy.  Digital screening mammogram on 09/14/2017 revealed no evidence of malignancy.  Symptomatically, she has chronic abdominal pain.  She has apparent vasomotor symptoms (sweats).  She has renal insufficiency. She has gained 8 pounds.  Exam is stable.  Plan:  1. Labs today:  CBC with diff, CMP. 2. Review interval labs and imaging. 3. Weight loss: 4.  5. Elevated tumor markers: 6.  7.  8. Review interval testing.  CT scans and bone scan personally reviewed.  No evidence of malignancy. 9. Labs today: FSH, estradiol, HIV Ab. 10. Discuss abdominal pain.  She has chronic constipation.  Will refer bask to GI (Dr.  Vicente Males).  11. Renal insufficiency - CrCl 30.5 mL/min. Etiology unclear.  Will refer to nephrology.  12. Protein calorie malnutrition -  Her weight today is  . Her BMI of   places her in an underweight category. Encouraged her to increase her intake of calorie and protein dense food choices. Additionally, patient was encouraged to utilize nutritional supplement shakes at least 2-3 times a day.  13. RTC in 3 months for MD assessment and labs (CBC with diff, CMP).    Lequita Asal, MD  04/07/2018, 5:36 AM   I saw and evaluated the patient, participating in the key portions of the service and reviewing  pertinent diagnostic studies and records.  I reviewed the nurse practitioner's note and agree with the findings and the plan.  The assessment and plan were discussed with the patient.  Several questions were asked by the patient and answered.   Nolon Stalls, MD 04/07/18, 5:36 AM

## 2018-04-17 ENCOUNTER — Telehealth: Payer: Self-pay

## 2018-04-17 NOTE — Telephone Encounter (Signed)
Called pt to follow up on constipation symptoms and effectiveness of the Amitiza for treatment. LVM to return call

## 2018-04-17 NOTE — Telephone Encounter (Signed)
-----   Message from Jonathon Bellows, MD sent at 04/02/2018 11:47 AM EDT ----- How is the constipation doing ?  Kiran  ----- Message ----- From: Rushie Chestnut, [place] Sent: 03/24/2018   1:58 PM EDT To: Jonathon Bellows, MD  Spoke with pt about results. Pt is requesting advice on what you suggest should be her next steps.  [name] ----- Message ----- From: Jonathon Bellows, MD Sent: 03/19/2018   2:07 PM EDT To: Lequita Asal, MD, Rushie Chestnut, CMA  Chao Blazejewski please inform patient that the MRCP shows no new changes-everything is stable   C.c Dr Mike Gip   Dr Jonathon Bellows MD,MRCP [facility]) Gastroenterology/Hepatology Pager: [phone]

## 2018-05-09 ENCOUNTER — Ambulatory Visit: Payer: BLUE CROSS/BLUE SHIELD | Admitting: Gastroenterology

## 2018-05-29 ENCOUNTER — Other Ambulatory Visit: Payer: Self-pay | Admitting: Gastroenterology

## 2018-06-21 NOTE — Telephone Encounter (Signed)
LVM for pt to call office regarding rx refill request that was received on 06/09/18.  Prescription is for Amitiza 35mcg capsules-one by mouth twice a day.   Called her to ensure that the medication is working to alleviate her constipation prior to sending in a refill. I also see that she missed her office visit to discuss results and other concerns.  Would you like to see her in the office for follow up?  Or if Amitiza is working for her -just send in refills.  Please advise.  Thanks [employer]

## 2018-07-13 ENCOUNTER — Other Ambulatory Visit: Payer: Self-pay | Admitting: Nurse Practitioner

## 2018-07-13 DIAGNOSIS — Z1231 Encounter for screening mammogram for malignant neoplasm of breast: Secondary | ICD-10-CM

## 2018-07-24 ENCOUNTER — Other Ambulatory Visit: Payer: Self-pay | Admitting: Gastroenterology

## 2018-08-10 DIAGNOSIS — J387 Other diseases of larynx: Secondary | ICD-10-CM | POA: Insufficient documentation

## 2018-08-16 DIAGNOSIS — G8929 Other chronic pain: Secondary | ICD-10-CM | POA: Insufficient documentation

## 2018-08-16 DIAGNOSIS — M545 Low back pain, unspecified: Secondary | ICD-10-CM | POA: Insufficient documentation

## 2018-08-16 DIAGNOSIS — N183 Chronic kidney disease, stage 3 unspecified: Secondary | ICD-10-CM | POA: Insufficient documentation

## 2018-08-16 DIAGNOSIS — F99 Mental disorder, not otherwise specified: Secondary | ICD-10-CM | POA: Insufficient documentation

## 2018-09-13 IMAGING — CT CT CHEST W/O CM
2 of 3 series · 15 of 36 positions shown, 18 images · non-contrast
Comparison: 02/06/2016

CLINICAL DATA: Elevated CEA.  Followup pulmonary nodule.

EXAM:
CT CHEST WITHOUT CONTRAST
TECHNIQUE: Multidetector CT imaging of the chest was performed following the
standard protocol without IV contrast.

[Series 2: thorax · axial · 0.62mm/px · z∈[-291,-39]mm · 12 of 148 slices shown, 15 images]
[im 11/148  mediastinal]
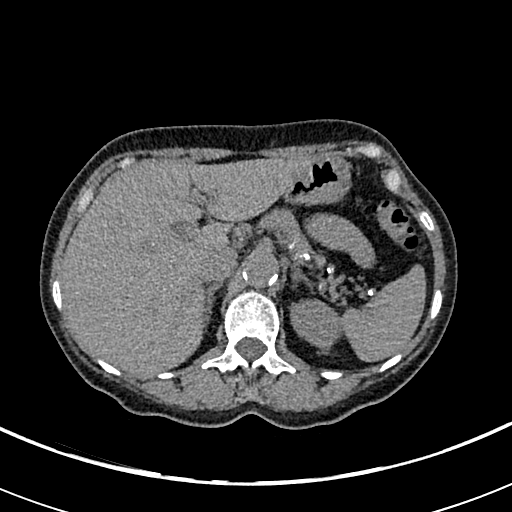
[im 11/148  lung]
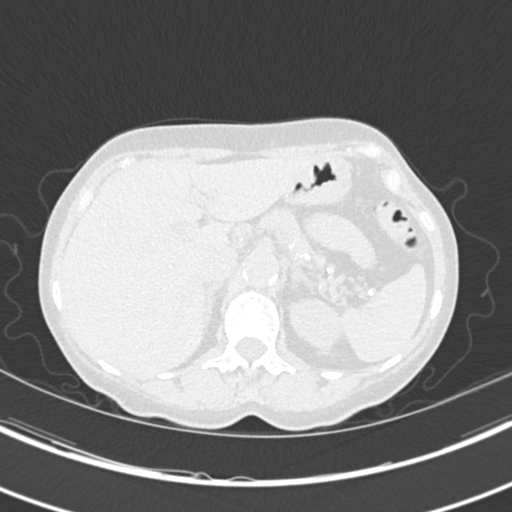
[im 22/148  lung]
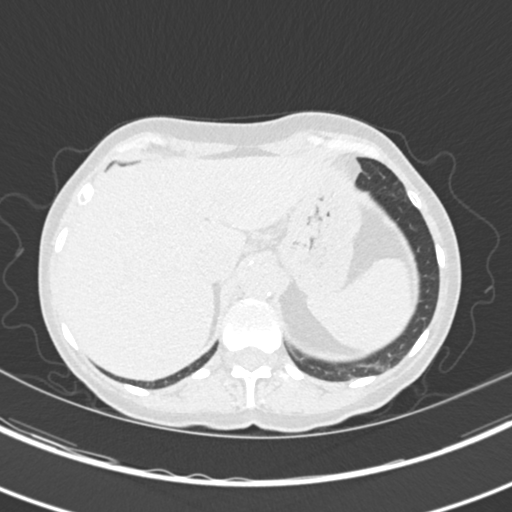
[im 33/148  lung]
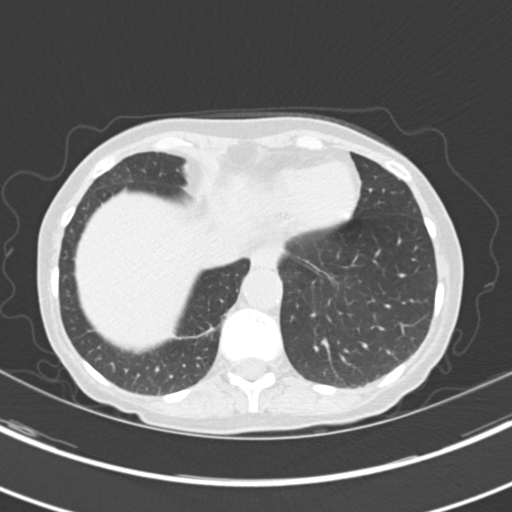
[im 44/148  lung]
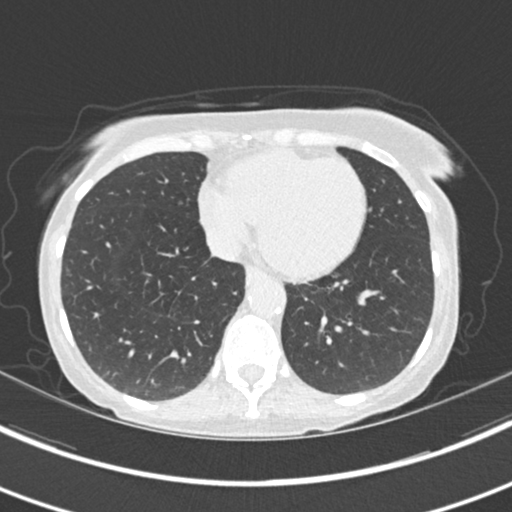
[im 55/148  mediastinal]
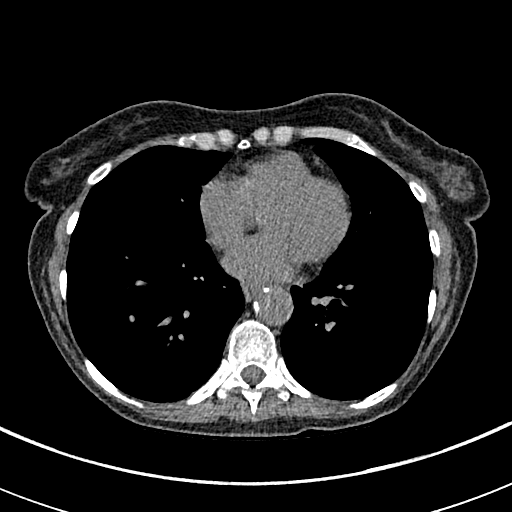
[im 55/148  lung]
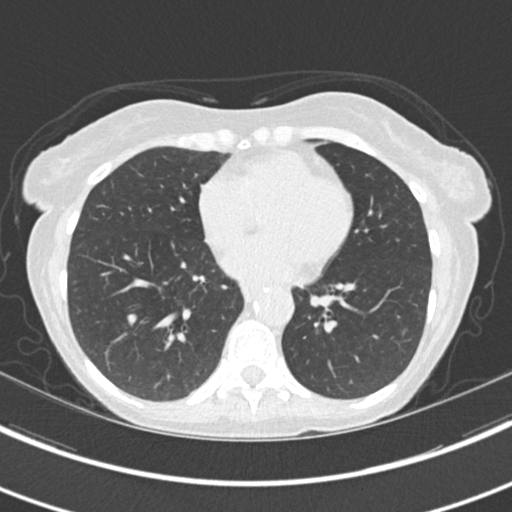
[im 66/148  lung]
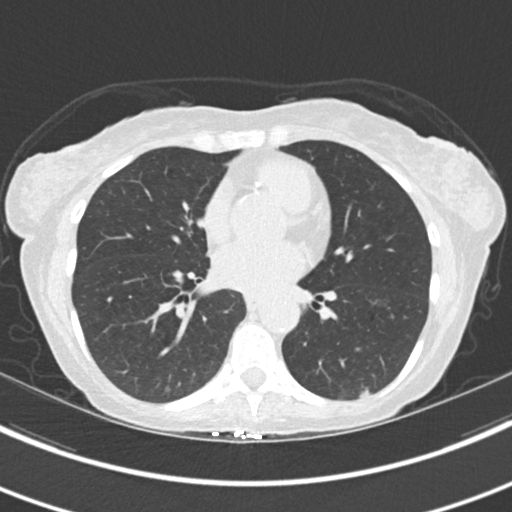
[im 82/148  lung]
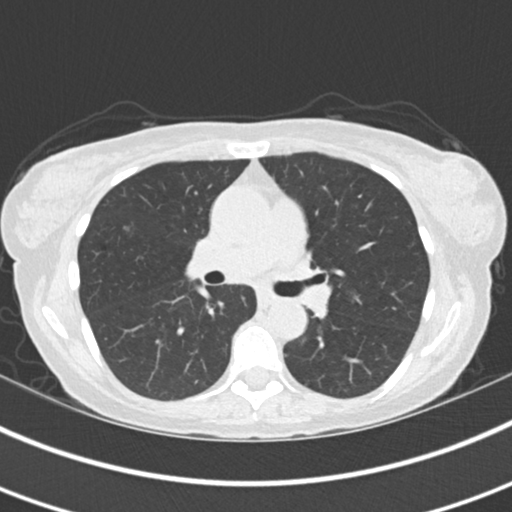
[im 93/148  lung]
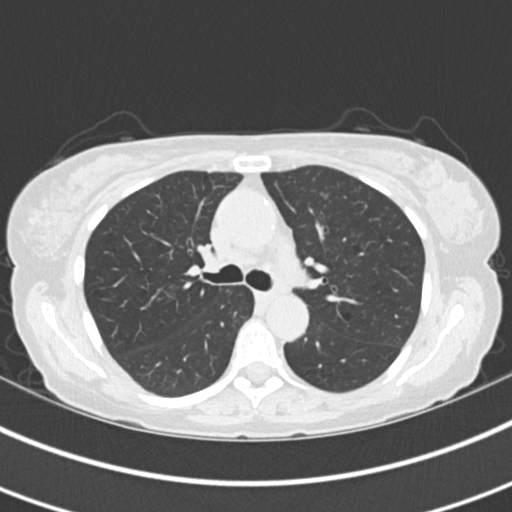
[im 104/148  mediastinal]
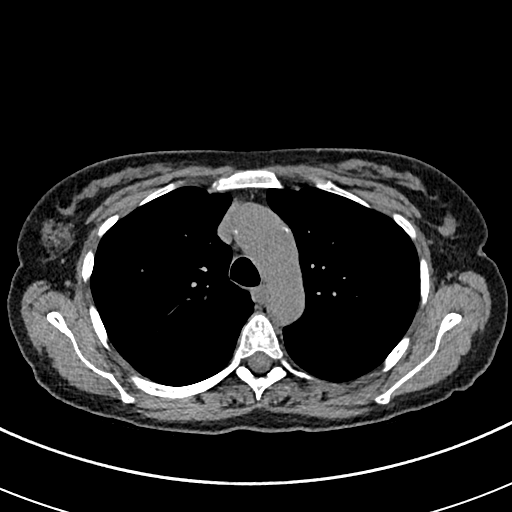
[im 104/148  lung]
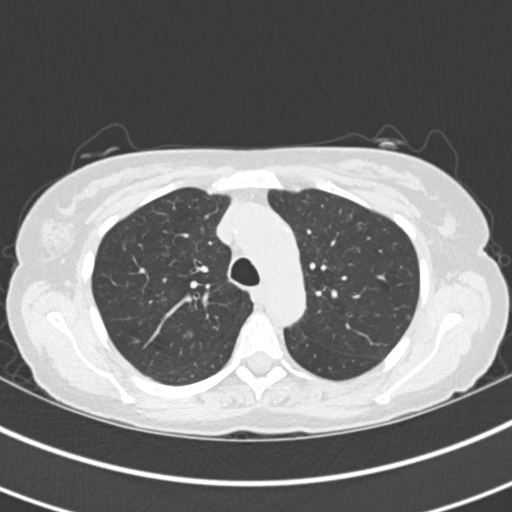
[im 115/148  lung]
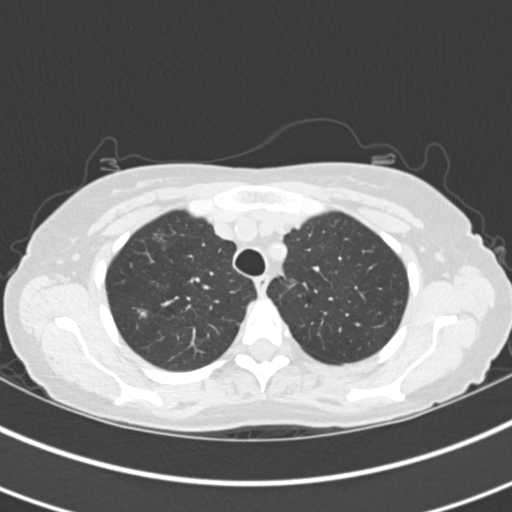
[im 126/148  lung]
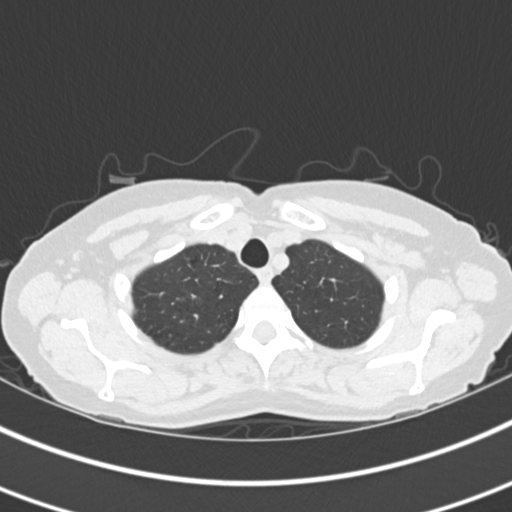
[im 137/148  lung]
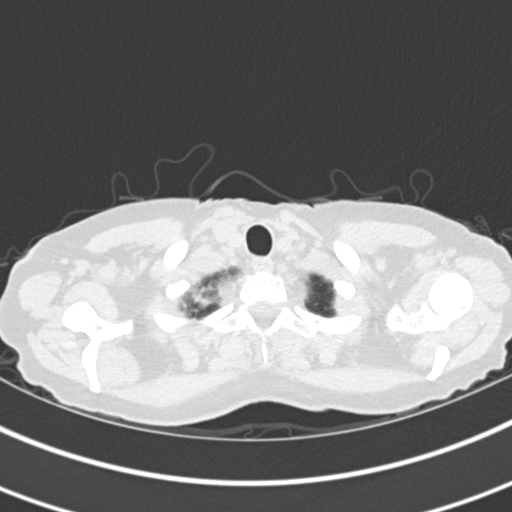

[Series 5: coronal · coronal · 0.63mm/px · 3 of 110 slices shown]
[im 22/110  lung]
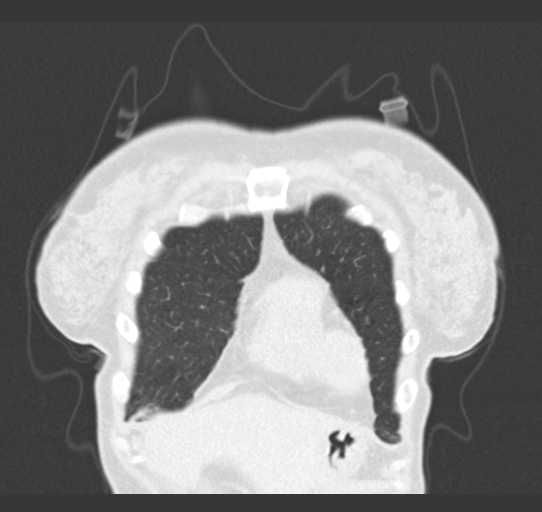
[im 44/110  lung]
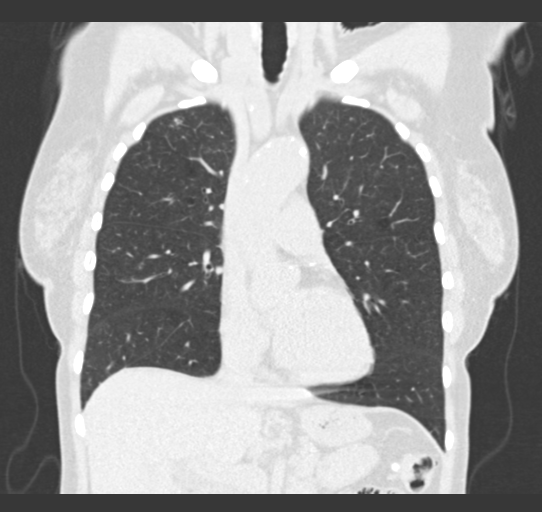
[im 66/110  lung]
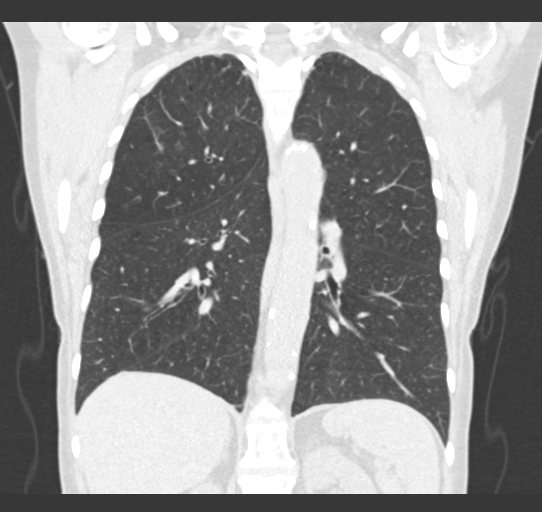

[15 of 36 positions shown; findings below may reference images not displayed]

FINDINGS: Cardiovascular: The heart size is normal. Aortic atherosclerosis.
Calcifications within the RCA, LAD and left circumflex coronary
artery noted.

Mediastinum/Nodes: The trachea appears patent and is midline. No
mediastinal or hilar adenopathy. No axillary or supraclavicular
adenopathy.

Lungs/Pleura: There is no pleural fluid. Moderate changes of
centrilobular emphysema. Diffuse bronchial wall thickening noted.
Again noted are scattered peribronchovascular nodular densities
which are upper lobe predominant. Index nodule scratch set the index
8 mm nodule within the left upper lobe (previous study image 38 of
series 3) has resolved in the interval. The index solid-appearing
nodule within the right upper lobe measuring 6 mm (previous study
image 36 of series 3) appears less solid on today's exam, image 34
series 3. The previous sub solid nodule within the right upper lobe
which measured 7 mm (previous exam image 44 of series 3) has
resolved in the interval. There is a new sub solid
peribronchovascular nodule within the right upper lobe, image 35 of
series 3. This measures 9 mm.

Upper Abdomen: Small stones are noted within the gallbladder. No
acute findings identified.

Musculoskeletal: No aggressive lytic or sclerotic bone lesions.
IMPRESSION: 1. Again noted all are areas of peribronchovascular nodularity which
appear upper lobe predominant. When compared with study from
01/27/2016 many in the previously noted nodules have resolved and
are several new nodules. Findings are most likely related to an
inflammatory or infectious process. Atypical infection not excluded.
2. Diffuse bronchial wall thickening with emphysema, as above;
imaging findings suggestive of underlying COPD.
3. Aortic atherosclerosis and multi vessel coronary artery
calcification

## 2019-06-05 DIAGNOSIS — E039 Hypothyroidism, unspecified: Secondary | ICD-10-CM | POA: Insufficient documentation

## 2019-06-05 DIAGNOSIS — J441 Chronic obstructive pulmonary disease with (acute) exacerbation: Secondary | ICD-10-CM | POA: Insufficient documentation

## 2019-06-05 DIAGNOSIS — J9601 Acute respiratory failure with hypoxia: Secondary | ICD-10-CM | POA: Insufficient documentation

## 2019-09-10 ENCOUNTER — Ambulatory Visit: Payer: BLUE CROSS/BLUE SHIELD | Admitting: Primary Care

## 2019-09-12 ENCOUNTER — Ambulatory Visit (INDEPENDENT_AMBULATORY_CARE_PROVIDER_SITE_OTHER): Payer: 59 | Admitting: Primary Care

## 2019-09-12 ENCOUNTER — Other Ambulatory Visit: Payer: Self-pay

## 2019-09-12 ENCOUNTER — Encounter: Payer: Self-pay | Admitting: Primary Care

## 2019-09-12 VITALS — BP 130/70 | HR 72 | Temp 96.8°F | Ht 58.5 in | Wt 86.5 lb

## 2019-09-12 DIAGNOSIS — I10 Essential (primary) hypertension: Secondary | ICD-10-CM | POA: Diagnosis not present

## 2019-09-12 DIAGNOSIS — I700 Atherosclerosis of aorta: Secondary | ICD-10-CM | POA: Diagnosis not present

## 2019-09-12 DIAGNOSIS — F1010 Alcohol abuse, uncomplicated: Secondary | ICD-10-CM

## 2019-09-12 DIAGNOSIS — G4700 Insomnia, unspecified: Secondary | ICD-10-CM | POA: Diagnosis not present

## 2019-09-12 DIAGNOSIS — R252 Cramp and spasm: Secondary | ICD-10-CM | POA: Diagnosis not present

## 2019-09-12 DIAGNOSIS — R918 Other nonspecific abnormal finding of lung field: Secondary | ICD-10-CM

## 2019-09-12 DIAGNOSIS — Z1159 Encounter for screening for other viral diseases: Secondary | ICD-10-CM

## 2019-09-12 DIAGNOSIS — E039 Hypothyroidism, unspecified: Secondary | ICD-10-CM

## 2019-09-12 DIAGNOSIS — J439 Emphysema, unspecified: Secondary | ICD-10-CM

## 2019-09-12 DIAGNOSIS — K219 Gastro-esophageal reflux disease without esophagitis: Secondary | ICD-10-CM

## 2019-09-12 MED ORDER — TRAZODONE HCL 50 MG PO TABS: 50.0000 mg | ORAL_TABLET | Freq: Every evening | ORAL | 0 refills | Status: DC | PRN

## 2019-09-12 MED ORDER — BREO ELLIPTA 200-25 MCG/INH IN AEPB: 1.0000 | INHALATION_SPRAY | Freq: Every day | RESPIRATORY_TRACT | 5 refills | Status: DC

## 2019-09-12 NOTE — Assessment & Plan Note (Signed)
Stable in the office today on lisinopril, continue same. CMP pending.

## 2019-09-12 NOTE — Assessment & Plan Note (Signed)
Emphysema evident on CTA from 2020 through [facility]. Refill provided for [name], she will update if insurance will not cover. Continue albuterol PRN.

## 2019-09-12 NOTE — Assessment & Plan Note (Signed)
Endorses sobriety for years. Liver enzymes pending.

## 2019-09-12 NOTE — Patient Instructions (Signed)
Stop by the lab prior to leaving today. I will notify you of your results once received.   You may take the Trazodone at bedtime for sleep.  Please call me if the [facility] inhaler is too expensive.   It was a pleasure to meet you today! Please don't hesitate to call or message me with any questions. Welcome to [facility]!

## 2019-09-12 NOTE — Progress Notes (Signed)
Subjective:    Patient ID: [name], female    DOB: [birth year], 59 y.o.   MRN: [id number]  HPI  This visit occurred during the SARS-CoV-2 public health emergency.  Safety protocols were in place, including screening questions prior to the visit, additional usage of staff PPE, and extensive cleaning of exam room while observing appropriate contact time as indicated for disinfecting solutions.   [name] is a 59 year old female who presents today to establish care and discuss the problems mentioned below. Will obtain/review records.  1) Anxiety/Insomina: Chronic history of anxiety, once managed on Xanax alone for anxiety, wanting a refill today. Was prescribed Mirtazapine 15 mg last year by her prior PCP for insomnia, she didn't like it as it caused her to feel anxious, she had the bottle with her today. Symptoms include feeling "up and down", worry, difficulty sleeping. She is mostly concerned about difficulty sleeping, has mind racing thoughts and cannot fall asleep. She will wake up within 1-2 hours. She has had "muscle spasms" in her legs for the last month.   Other than Mirtazapine, she's taken Melatonin with some improvement. She was once managed on Trazodone in the past and did well. GAD 7 score of 8 today.  2) Hypothyroidism: Currently managed on levothyroxine 50 mcg. She takes her levothyroxine every morning on an empty stomach with water no food or other meds for one hour. She does take vitamins and Biotin witin 2 hours. She's not sure when her last TSH was checked.  3) COPD/Tobacco Abuse: Emphysema evident on CT from 2020. Currently has inhalers for Breo Ellipta and Symbicort, albuterol HFA. She obtained these inhalers from the ED as "samples". She does have an albuterol inhaler but doesn't use often. She feels as thought Breo was better, would like a refill.   4) Essential Hypertension/Aortic Atherosclerosis: Currently managed on lisinopril 10 mg. Not currently on statin  therapy. Aortic atherosclerosis found on CT chest to rule out PE in December 2020.   BP Readings from Last 3 Encounters:  09/12/19 130/70  02/07/18 (!) 151/81  01/03/18 (!) 143/77   5) GERD: Currently managed on pantoprazole 20 mg once nightly. History of diverticulitis. Cannot go without pantoprazole.   Review of Systems  Respiratory: Negative for cough and shortness of breath.   Cardiovascular: Negative for chest pain.  Gastrointestinal:       GERD  Psychiatric/Behavioral: Positive for sleep disturbance. The patient is nervous/anxious.        Past Medical History:  Diagnosis Date  . Arthritis    hands, back, knees  . Diverticulitis   . GERD (gastroesophageal reflux disease)   . Hypertension    somewhat controlled, taking medication  . Hypothyroidism      Social History   Socioeconomic History  . Marital status: Single    Spouse name: Not on file  . Number of children: Not on file  . Years of education: Not on file  . Highest education level: Not on file  Occupational History  . Not on file  Tobacco Use  . Smoking status: Current Some Day Smoker    Packs/day: 2.00    Years: 42.00    Pack years: 84.00    Types: Cigarettes    Last attempt to quit: 07/07/2015    Years since quitting: 4.1  . Smokeless tobacco: Never Used  . Tobacco comment: Patient states she smoke 2 cigarettes a day  Substance and Sexual Activity  . Alcohol use: Yes  Alcohol/week: 35.0 standard drinks    Types: 7 Glasses of wine, 28 Shots of liquor per week    Comment: 01/10/13 - quit 6 to 7 months ago, prior heavy use  . Drug use: No  . Sexual activity: Never  Other Topics Concern  . Not on file  Social History Narrative  . Not on file   Social Determinants of Health   Financial Resource Strain:   . Difficulty of Paying Living Expenses:   Food Insecurity:   . Worried About Charity fundraiser in the Last Year:   . Arboriculturist in the Last Year:   Transportation Needs:   . Lexicographer (Medical):   Marland Kitchen Lack of Transportation (Non-Medical):   Physical Activity:   . Days of Exercise per Week:   . Minutes of Exercise per Session:   Stress:   . Feeling of Stress :   Social Connections:   . Frequency of Communication with Friends and Family:   . Frequency of Social Gatherings with Friends and Family:   . Attends Religious Services:   . Active Member of Clubs or Organizations:   . Attends Archivist Meetings:   Marland Kitchen Marital Status:   Intimate Partner Violence:   . Fear of Current or Ex-Partner:   . Emotionally Abused:   Marland Kitchen Physically Abused:   . Sexually Abused:     Past Surgical History:  Procedure Laterality Date  . ABDOMINAL HYSTERECTOMY    . APPENDECTOMY    . CHOLECYSTECTOMY    . COLONOSCOPY  01/09/2012   Procedure: COLONOSCOPY;  Surgeon: Lear Ng, MD;  Location: [facility] ENDOSCOPY;  Service: Endoscopy;  Laterality: N/A;  . COLONOSCOPY WITH PROPOFOL N/A 08/25/2017   Procedure: COLONOSCOPY WITH PROPOFOL;  Surgeon: Jonathon Bellows, MD;  Location: [facility] ENDOSCOPY;  Service: Gastroenterology;  Laterality: N/A;  . ESOPHAGOGASTRODUODENOSCOPY  01/07/2012   Procedure: ESOPHAGOGASTRODUODENOSCOPY (EGD);  Surgeon: Winfield Cunas., MD;  Location: [facility] ENDOSCOPY;  Service: Endoscopy;  Laterality: N/A;  . ESOPHAGOGASTRODUODENOSCOPY (EGD) WITH PROPOFOL N/A 08/25/2017   Procedure: ESOPHAGOGASTRODUODENOSCOPY (EGD) WITH PROPOFOL;  Surgeon: Jonathon Bellows, MD;  Location: [facility] ENDOSCOPY;  Service: Gastroenterology;  Laterality: N/A;  . VIDEO BRONCHOSCOPY Bilateral 05/14/2016   Procedure: VIDEO BRONCHOSCOPY WITHOUT FLUORO;  Surgeon: Collene Gobble, MD;  Location: [place];  Service: Cardiopulmonary;  Laterality: Bilateral;    Family History  Problem Relation Age of Onset  . Breast cancer Mother        multiple types of cancer  . Stomach cancer Mother   . Throat cancer Mother   . Melanoma Mother   . Kidney disease Father     No Known Allergies  Current  Outpatient Medications on File Prior to Visit  Medication Sig Dispense Refill  . aspirin EC 81 MG tablet Take 81 mg by mouth daily.    . Biotin 5000 MCG TABS Take by mouth daily.    . Cholecalciferol (VITAMIN D-3) 5000 UNITS TABS Take 10,000 Units by mouth daily after breakfast.    . Cyanocobalamin (B-12) 5000 MCG CAPS Take by mouth daily.    Marland Kitchen levothyroxine (SYNTHROID, LEVOTHROID) 50 MCG tablet Take 50 mcg by mouth daily before breakfast.    . lisinopril (ZESTRIL) 10 MG tablet Take 10 mg by mouth daily.    . pantoprazole (PROTONIX) 20 MG tablet Take 20 mg by mouth daily.   4  . Probiotic Product (PROBIOTIC-10 PO) Take by mouth daily.    Marland Kitchen albuterol (VENTOLIN HFA) 108 (  90 Base) MCG/ACT inhaler TAKE 2 PUFFS BY MOUTH EVERY 6 HOURS AS NEEDED FOR WHEEZE     No current facility-administered medications on file prior to visit.    BP 130/70   Pulse 72   Temp (!) 96.8 F (36 C) (Temporal)   Ht 4' 10.5" (1.486 m)   Wt 86 lb 8 oz (39.2 kg)   SpO2 97%   BMI 17.77 kg/m    Objective:   Physical Exam  Constitutional: She appears well-nourished.  Cardiovascular: Normal rate and regular rhythm.  Respiratory: Effort normal and breath sounds normal.  Musculoskeletal:     Cervical back: Neck supple.  Skin: Skin is warm and dry.  Psychiatric: She has a normal mood and affect.           Assessment & Plan:

## 2019-09-12 NOTE — Assessment & Plan Note (Signed)
Overall taking levothyroxine correctly, discussed to take all vitamins and supplements 4 hours or later from levothyroxine. Repeat TSH pending.

## 2019-09-12 NOTE — Assessment & Plan Note (Signed)
Acute for the last month. Checking labs today. Discussed to stretch before bed nightly.

## 2019-09-12 NOTE — Assessment & Plan Note (Signed)
Incidental finding from CTA in 2020 through care everywhere. Check lipids.  Discussed the need for statin therapy, she will think about this. Await labs.

## 2019-09-12 NOTE — Assessment & Plan Note (Signed)
Not noted on CTA from 2020.

## 2019-09-12 NOTE — Assessment & Plan Note (Signed)
Chronic, cannot tolerate coming off of PPI. Continue for now.

## 2019-09-12 NOTE — Assessment & Plan Note (Signed)
Likely secondary to anxiety, she declines treatment for anxiety today. Discussed that I will not prescribe Xanax as this is not best practice.  Rx for Trazodone sent to pharmacy as she did well on this in the future.

## 2019-09-13 LAB — CBC
HCT: 46.4 % — ABNORMAL HIGH (ref 36.0–46.0)
Hemoglobin: 15.9 g/dL — ABNORMAL HIGH (ref 12.0–15.0)
MCHC: 34.2 g/dL (ref 30.0–36.0)
MCV: 104.8 fl — ABNORMAL HIGH (ref 78.0–100.0)
Platelets: 288 10*3/uL (ref 150.0–400.0)
RBC: 4.42 Mil/uL (ref 3.87–5.11)
RDW: 13.9 % (ref 11.5–15.5)
WBC: 9 10*3/uL (ref 4.0–10.5)

## 2019-09-13 LAB — COMPREHENSIVE METABOLIC PANEL
ALT: 10 U/L (ref 0–35)
AST: 17 U/L (ref 0–37)
Albumin: 4.5 g/dL (ref 3.5–5.2)
Alkaline Phosphatase: 80 U/L (ref 39–117)
BUN: 15 mg/dL (ref 6–23)
CO2: 28 mEq/L (ref 19–32)
Calcium: 10.2 mg/dL (ref 8.4–10.5)
Chloride: 101 mEq/L (ref 96–112)
Creatinine, Ser: 1.42 mg/dL — ABNORMAL HIGH (ref 0.40–1.20)
GFR: 38.09 mL/min — ABNORMAL LOW (ref >60.00)
Glucose, Bld: 91 mg/dL (ref 70–99)
Potassium: 5.1 mEq/L (ref 3.5–5.1)
Sodium: 139 mEq/L (ref 135–145)
Total Bilirubin: 0.5 mg/dL (ref 0.2–1.2)
Total Protein: 6.9 g/dL (ref 6.0–8.3)

## 2019-09-13 LAB — LIPID PANEL
Cholesterol: 173 mg/dL (ref 0–200)
HDL: 51.1 mg/dL (ref >39.00)
LDL Cholesterol: 95 mg/dL (ref 0–99)
NonHDL: 122.04
Total CHOL/HDL Ratio: 3
Triglycerides: 135 mg/dL (ref 0.0–149.0)
VLDL: 27 mg/dL (ref 0.0–40.0)

## 2019-09-13 LAB — TSH: TSH: 0.99 u[IU]/mL (ref 0.35–4.50)

## 2019-09-13 LAB — HEPATITIS C ANTIBODY
Hepatitis C Ab: NONREACTIVE
SIGNAL TO CUT-OFF: 0.01 (ref <1.00)

## 2019-09-13 LAB — MAGNESIUM: Magnesium: 2.3 mg/dL (ref 1.5–2.5)

## 2019-09-17 ENCOUNTER — Telehealth: Payer: Self-pay | Admitting: Primary Care

## 2019-09-17 ENCOUNTER — Other Ambulatory Visit: Payer: Self-pay | Admitting: Primary Care

## 2019-09-17 DIAGNOSIS — N1832 Chronic kidney disease, stage 3b: Secondary | ICD-10-CM

## 2019-09-17 DIAGNOSIS — I700 Atherosclerosis of aorta: Secondary | ICD-10-CM

## 2019-09-17 MED ORDER — ROSUVASTATIN CALCIUM 5 MG PO TABS: 5.0000 mg | ORAL_TABLET | Freq: Every evening | ORAL | 3 refills | Status: DC

## 2019-09-17 NOTE — Telephone Encounter (Signed)
Addressed in result note.  

## 2019-09-17 NOTE — Telephone Encounter (Signed)
Patient is returning your call about lab results.    Unable to attach to lab results

## 2019-09-19 NOTE — Telephone Encounter (Signed)
Addressed in result note.  

## 2019-09-19 NOTE — Telephone Encounter (Signed)
[place] Night - Client Nonclinical Telephone Record AccessNurse Client [place] Night - Client Client Site [place] - Night Physician [facility] - PHYSICIAN, [name]- MD Contact Type Call Who Is Calling Patient / Member / Family / Caregiver Caller Name [name] Caller Phone Number [phone] Call Type Message Only Information Provided Reason for Call Returning a Call from the Office Initial [place] is returning a call from the office Additional Comment provided office hours Disp. Time Disposition Final User 09/18/2019 5:05:19 PM General Information Provided Yes Apolinar Junes Call Closed By: Apolinar Junes Transaction Date/Time: 09/18/2019 5:01:18 PM (ET)

## 2019-09-20 ENCOUNTER — Other Ambulatory Visit: Payer: Self-pay | Admitting: Primary Care

## 2019-09-20 DIAGNOSIS — R718 Other abnormality of red blood cells: Secondary | ICD-10-CM

## 2019-09-20 DIAGNOSIS — E43 Unspecified severe protein-calorie malnutrition: Secondary | ICD-10-CM

## 2019-10-03 ENCOUNTER — Other Ambulatory Visit: Payer: Self-pay

## 2019-10-03 ENCOUNTER — Inpatient Hospital Stay: Payer: 59 | Attending: Internal Medicine | Admitting: Internal Medicine

## 2019-10-03 ENCOUNTER — Encounter: Payer: Self-pay | Admitting: Internal Medicine

## 2019-10-03 ENCOUNTER — Inpatient Hospital Stay: Payer: 59

## 2019-10-03 DIAGNOSIS — E039 Hypothyroidism, unspecified: Secondary | ICD-10-CM | POA: Diagnosis not present

## 2019-10-03 DIAGNOSIS — R918 Other nonspecific abnormal finding of lung field: Secondary | ICD-10-CM | POA: Diagnosis not present

## 2019-10-03 DIAGNOSIS — I10 Essential (primary) hypertension: Secondary | ICD-10-CM | POA: Diagnosis not present

## 2019-10-03 DIAGNOSIS — Z7982 Long term (current) use of aspirin: Secondary | ICD-10-CM | POA: Diagnosis not present

## 2019-10-03 DIAGNOSIS — Z79899 Other long term (current) drug therapy: Secondary | ICD-10-CM | POA: Diagnosis not present

## 2019-10-03 DIAGNOSIS — Z801 Family history of malignant neoplasm of trachea, bronchus and lung: Secondary | ICD-10-CM | POA: Insufficient documentation

## 2019-10-03 DIAGNOSIS — D7589 Other specified diseases of blood and blood-forming organs: Secondary | ICD-10-CM

## 2019-10-03 DIAGNOSIS — Z90710 Acquired absence of both cervix and uterus: Secondary | ICD-10-CM | POA: Diagnosis not present

## 2019-10-03 DIAGNOSIS — Z803 Family history of malignant neoplasm of breast: Secondary | ICD-10-CM | POA: Diagnosis not present

## 2019-10-03 DIAGNOSIS — Z800 Family history of malignant neoplasm of digestive organs: Secondary | ICD-10-CM | POA: Insufficient documentation

## 2019-10-03 DIAGNOSIS — F17210 Nicotine dependence, cigarettes, uncomplicated: Secondary | ICD-10-CM | POA: Insufficient documentation

## 2019-10-03 DIAGNOSIS — K219 Gastro-esophageal reflux disease without esophagitis: Secondary | ICD-10-CM | POA: Insufficient documentation

## 2019-10-03 DIAGNOSIS — Z7951 Long term (current) use of inhaled steroids: Secondary | ICD-10-CM | POA: Diagnosis not present

## 2019-10-03 DIAGNOSIS — Z791 Long term (current) use of non-steroidal anti-inflammatories (NSAID): Secondary | ICD-10-CM

## 2019-10-03 DIAGNOSIS — N183 Chronic kidney disease, stage 3 unspecified: Secondary | ICD-10-CM | POA: Diagnosis not present

## 2019-10-03 LAB — COMPREHENSIVE METABOLIC PANEL
ALT: 16 U/L (ref 0–44)
AST: 23 U/L (ref 15–41)
Albumin: 4.6 g/dL (ref 3.5–5.0)
Alkaline Phosphatase: 75 U/L (ref 38–126)
Anion gap: 11 (ref 5–15)
BUN: 16 mg/dL (ref 6–20)
CO2: 29 mmol/L (ref 22–32)
Calcium: 9.7 mg/dL (ref 8.9–10.3)
Chloride: 96 mmol/L — ABNORMAL LOW (ref 98–111)
Creatinine, Ser: 1.29 mg/dL — ABNORMAL HIGH (ref 0.44–1.00)
GFR calc Af Amer: 53 mL/min — ABNORMAL LOW (ref >60)
GFR calc non Af Amer: 46 mL/min — ABNORMAL LOW (ref >60)
Glucose, Bld: 89 mg/dL (ref 70–99)
Potassium: 4.5 mmol/L (ref 3.5–5.1)
Sodium: 136 mmol/L (ref 135–145)
Total Bilirubin: 0.5 mg/dL (ref 0.3–1.2)
Total Protein: 7.6 g/dL (ref 6.5–8.1)

## 2019-10-03 LAB — CBC WITH DIFFERENTIAL/PLATELET
Abs Immature Granulocytes: 0.01 10*3/uL (ref 0.00–0.07)
Basophils Absolute: 0.1 10*3/uL (ref 0.0–0.1)
Basophils Relative: 1 %
Eosinophils Absolute: 0.3 10*3/uL (ref 0.0–0.5)
Eosinophils Relative: 4 %
HCT: 43.3 % (ref 36.0–46.0)
Hemoglobin: 14.7 g/dL (ref 12.0–15.0)
Immature Granulocytes: 0 %
Lymphocytes Relative: 26 %
Lymphs Abs: 2.2 10*3/uL (ref 0.7–4.0)
MCH: 35 pg — ABNORMAL HIGH (ref 26.0–34.0)
MCHC: 33.9 g/dL (ref 30.0–36.0)
MCV: 103.1 fL — ABNORMAL HIGH (ref 80.0–100.0)
Monocytes Absolute: 0.7 10*3/uL (ref 0.1–1.0)
Monocytes Relative: 9 %
Neutro Abs: 5 10*3/uL (ref 1.7–7.7)
Neutrophils Relative %: 60 %
Platelets: 272 10*3/uL (ref 150–400)
RBC: 4.2 MIL/uL (ref 3.87–5.11)
RDW: 12.8 % (ref 11.5–15.5)
WBC: 8.3 10*3/uL (ref 4.0–10.5)
nRBC: 0 % (ref 0.0–0.2)

## 2019-10-03 LAB — TECHNOLOGIST SMEAR REVIEW: Plt Morphology: NORMAL

## 2019-10-03 LAB — RETICULOCYTES
Immature Retic Fract: 5.1 % (ref 2.3–15.9)
RBC.: 4.21 MIL/uL (ref 3.87–5.11)
Retic Count, Absolute: 60.2 10*3/uL (ref 19.0–186.0)
Retic Ct Pct: 1.4 % (ref 0.4–3.1)

## 2019-10-03 LAB — LACTATE DEHYDROGENASE: LDH: 127 U/L (ref 98–192)

## 2019-10-03 LAB — FOLATE: Folate: 47 ng/mL (ref >5.9)

## 2019-10-03 LAB — VITAMIN B12: Vitamin B-12: >7500 pg/mL — ABNORMAL HIGH (ref 180–914)

## 2019-10-03 NOTE — Progress Notes (Signed)
New patient visit for elevated MCV, pt states she use to see Dr Mike Gip.

## 2019-10-03 NOTE — Assessment & Plan Note (Addendum)
#   Macrocytosis- with out anemia [MCV 103; Hb 13]; discussed the potential etiologies benign versus malignant.  Benign etiologies include liver disease medications/alcohol.  Malignant causes-less likely.  Recommend checking Liviu peripheral smear 123456 folic acid chemistries CBC LDH reticulocyte count.  123456 folic acid.  # CKD-Stage III creatinine 1.3 question etiology [awaiting nephro]  # 2019- lung nodule-right upper lobe and right lower lobe 2 mm lung nodules-would recommend surveillance imaging; order at next visit.  Thank you, Ms.Clark NP for allowing me to participate in the care of your pleasant patient. Please do not hesitate to contact me with questions or concerns in the interim.   # DISPOSITION: # labs-cbc/cmp-' T review;  B12/ folate/ LDH/reticulocyte count[ordered]  # Follow up in 3 months; MD; labs-cbc]- Dr.B

## 2019-10-03 NOTE — Progress Notes (Signed)
[facility] NOTE  Patient Care Team: Pleas Koch, NP as PCP - General (Internal Medicine) Alvester Chou, NP as PCP - Internal Medicine (Nurse Practitioner) Clent Jacks, RN as Registered Nurse  CHIEF COMPLAINTS/PURPOSE OF CONSULTATION:    HEMATOLOGY HISTORY  # MACROCYTOSIS without anemia [since 2016] EGD colonoscopy [march 2019; Dr.Anna]-HIV hepatitis-negative; 123456 folic acid normal.  [id number] July CT scan-no hepatosplenomegaly.  #CKD stage III-awaiting nephrology  #2019 June-right upper lobe/lower lobe 2 mm lung nodules-needs surveillance; active smoker   HISTORY OF PRESENTING ILLNESS:  [name] 59 y.o.  female has been referred to [place] for further evaluation/work-up for macrocytosis.  Patient admits to mild fatigue.  Denies any blood in stools or black or stools.  Denies any nausea vomiting abdominal pain.  Drinks alcohol wine every other day.  Denies any significant alcohol/hard liquor intake.  Denies any liver disease.  Review of Systems  Constitutional: Negative for chills, diaphoresis, fever, malaise/fatigue and weight loss.  HENT: Negative for nosebleeds and sore throat.   Eyes: Negative for double vision.  Respiratory: Negative for cough, hemoptysis, sputum production, shortness of breath and wheezing.   Cardiovascular: Negative for chest pain, palpitations, orthopnea and leg swelling.  Gastrointestinal: Negative for abdominal pain, blood in stool, constipation, diarrhea, heartburn, melena, nausea and vomiting.  Genitourinary: Negative for dysuria, frequency and urgency.  Musculoskeletal: Positive for back pain and joint pain.  Skin: Negative.  Negative for itching and rash.  Neurological: Negative for dizziness, tingling, focal weakness, weakness and headaches.  Endo/Heme/Allergies: Does not bruise/bleed easily.  Psychiatric/Behavioral: Negative for depression. The patient is not nervous/anxious and does not have insomnia.      MEDICAL HISTORY:  Past Medical History:  Diagnosis Date  . Acute diverticulitis 07/16/2013  . Acute renal failure ([place]) 07/16/2013  . Arthritis    hands, back, knees  . Colitis 07/16/2013  . Diverticulitis   . Elevated CEA 08/15/2017  . GERD (gastroesophageal reflux disease)   . GIB (gastrointestinal bleeding) 01/07/2012  . Hypertension    somewhat controlled, taking medication  . Hypothyroidism   . Lesion of epiglottis   . Macrocytosis without anemia 08/15/2017  . Orthostasis 08/22/2017    SURGICAL HISTORY: Past Surgical History:  Procedure Laterality Date  . ABDOMINAL HYSTERECTOMY    . APPENDECTOMY    . CHOLECYSTECTOMY    . COLONOSCOPY  01/09/2012   Procedure: COLONOSCOPY;  Surgeon: Lear Ng, MD;  Location[facility] ENDOSCOPY;  Service: Endoscopy;  Laterality: N/A;  . COLONOSCOPY WITH PROPOFOL N/A 08/25/2017   Procedure: COLONOSCOPY WITH PROPOFOL;  Surgeon: Jonathon Bellows, MD;  Location: [facility] ENDOSCOPY;  Service: Gastroenterology;  Laterality: N/A;  . ESOPHAGOGASTRODUODENOSCOPY  01/07/2012   Procedure: ESOPHAGOGASTRODUODENOSCOPY (EGD);  Surgeon: Winfield Cunas., MD;  Location: [facility] ENDOSCOPY;  Service: Endoscopy;  Laterality: N/A;  . ESOPHAGOGASTRODUODENOSCOPY (EGD) WITH PROPOFOL N/A 08/25/2017   Procedure: ESOPHAGOGASTRODUODENOSCOPY (EGD) WITH PROPOFOL;  Surgeon: Jonathon Bellows, MD;  Location: [facility] ENDOSCOPY;  Service: Gastroenterology;  Laterality: N/A;  . VIDEO BRONCHOSCOPY Bilateral 05/14/2016   Procedure: VIDEO BRONCHOSCOPY WITHOUT FLUORO;  Surgeon: Collene Gobble, MD;  Location: [place];  Service: Cardiopulmonary;  Laterality: Bilateral;    SOCIAL HISTORY: Social History   Socioeconomic History  . Marital status: Single    Spouse name: Not on file  . Number of children: Not on file  . Years of education: Not on file  . Highest education level: Not on file  Occupational History  . Not on file  Tobacco Use  .  Smoking status: Current Some Day Smoker    Packs/day:  2.00    Years: 42.00    Pack years: 84.00    Types: Cigarettes    Last attempt to quit: 07/07/2015    Years since quitting: 4.2  . Smokeless tobacco: Never Used  . Tobacco comment: Patient states she smoke 2 cigarettes a day  Substance and Sexual Activity  . Alcohol use: Yes    Alcohol/week: 35.0 standard drinks    Types: 7 Glasses of wine, 28 Shots of liquor per week    Comment: 01/10/13 - quit 6 to 7 months ago, prior heavy use  . Drug use: No  . Sexual activity: Never  Other Topics Concern  . Not on file  Social History Narrative   Works at [employer]; trying to quit 2/cig [~40 years]; glass of wine every other day.   Social Determinants of Health   Financial Resource Strain:   . Difficulty of Paying Living Expenses:   Food Insecurity:   . Worried About Charity fundraiser in the Last Year:   . Arboriculturist in the Last Year:   Transportation Needs:   . Film/video editor (Medical):   Marland Kitchen Lack of Transportation (Non-Medical):   Physical Activity:   . Days of Exercise per Week:   . Minutes of Exercise per Session:   Stress:   . Feeling of Stress :   Social Connections:   . Frequency of Communication with Friends and Family:   . Frequency of Social Gatherings with Friends and Family:   . Attends Religious Services:   . Active Member of Clubs or Organizations:   . Attends Archivist Meetings:   Marland Kitchen Marital Status:   Intimate Partner Violence:   . Fear of Current or Ex-Partner:   . Emotionally Abused:   Marland Kitchen Physically Abused:   . Sexually Abused:     FAMILY HISTORY: Family History  Problem Relation Age of Onset  . Breast cancer Mother        multiple types of cancer  . Stomach cancer Mother   . Throat cancer Mother   . Melanoma Mother   . Kidney disease Father     ALLERGIES:  has No Known Allergies.  MEDICATIONS:  Current Outpatient Medications  Medication Sig Dispense Refill  . albuterol (VENTOLIN HFA) 108 (90 Base) MCG/ACT inhaler TAKE 2 PUFFS BY MOUTH  EVERY 6 HOURS AS NEEDED FOR WHEEZE    . aspirin EC 81 MG tablet Take 81 mg by mouth daily.    . Biotin 5000 MCG TABS Take by mouth daily.    . Cholecalciferol (VITAMIN D-3) 5000 UNITS TABS Take 10,000 Units by mouth daily after breakfast.    . Cyanocobalamin (B-12) 5000 MCG CAPS Take by mouth daily.    . fluticasone furoate-vilanterol (BREO ELLIPTA) 200-25 MCG/INH AEPB Inhale 1 puff into the lungs daily. 60 each 5  . levothyroxine (SYNTHROID, LEVOTHROID) 50 MCG tablet Take 50 mcg by mouth daily before breakfast.    . lisinopril (ZESTRIL) 10 MG tablet Take 10 mg by mouth daily.    . pantoprazole (PROTONIX) 20 MG tablet Take 20 mg by mouth daily.   4  . Probiotic Product (PROBIOTIC-10 PO) Take by mouth daily.    . rosuvastatin (CRESTOR) 5 MG tablet Take 1 tablet (5 mg total) by mouth every evening. For cholesterol. 90 tablet 3  . traZODone (DESYREL) 50 MG tablet Take 1 tablet (50 mg total) by mouth at bedtime as needed  for sleep. 90 tablet 0  . diclofenac Sodium (VOLTAREN) 1 % GEL APPLY TO THUMB TO 3 TIMES A DAY AS NEEDED FOR PAIN.    . HYDROcodone-acetaminophen (NORCO) 10-325 MG tablet Take 1 tablet by mouth 3 (three) times daily as needed.    . ondansetron (ZOFRAN-ODT) 8 MG disintegrating tablet SMARTSIG:1 Tablet(s) By Mouth Every 12 Hours PRN     No current facility-administered medications for this visit.      PHYSICAL EXAMINATION:   Vitals:   10/03/19 1417  BP: 108/60  Pulse: 69  Resp: 18  Temp: (!) 96.8 F (36 C)  SpO2: 98%   Filed Weights   10/03/19 1417  Weight: 92 lb (41.7 kg)    Physical Exam  Constitutional: She is oriented to person, place, and time and well-developed, well-nourished, and in no distress.  HENT:  Head: Normocephalic and atraumatic.  Mouth/Throat: Oropharynx is clear and moist. No oropharyngeal exudate.  Eyes: Pupils are equal, round, and reactive to light.  Cardiovascular: Normal rate and regular rhythm.  Pulmonary/Chest: Effort normal and breath  sounds normal. No respiratory distress. She has no wheezes.  Abdominal: Soft. Bowel sounds are normal. She exhibits no distension and no mass. There is no abdominal tenderness. There is no rebound and no guarding.  Musculoskeletal:        General: No tenderness or edema. Normal range of motion.     Cervical back: Normal range of motion and neck supple.  Neurological: She is alert and oriented to person, place, and time.  Skin: Skin is warm.  Psychiatric: Affect normal.    LABORATORY DATA:  I have reviewed the data as listed Lab Results  Component Value Date   WBC 8.3 10/03/2019   HGB 14.7 10/03/2019   HCT 43.3 10/03/2019   MCV 103.1 (H) 10/03/2019   PLT 272 10/03/2019   Recent Labs    09/12/19 1530 10/03/19 1454  NA 139 136  K 5.1 4.5  CL 101 96*  CO2 28 29  GLUCOSE 91 89  BUN 15 16  CREATININE 1.42* 1.29*  CALCIUM 10.2 9.7  GFRNONAA  --  46*  GFRAA  --  53*  PROT 6.9 7.6  ALBUMIN 4.5 4.6  AST 17 23  ALT 10 16  ALKPHOS 80 75  BILITOT 0.5 0.5     No results found.  Macrocytosis # Macrocytosis- with out anemia [MCV 103; Hb 13]; discussed the potential etiologies benign versus malignant.  Benign etiologies include liver disease medications/alcohol.  Malignant causes-less likely.  Recommend checking Liviu peripheral smear 123456 folic acid chemistries CBC LDH reticulocyte count.  123456 folic acid.  # CKD-Stage III creatinine 1.3 question etiology [awaiting nephro]  # 2019- lung nodule-right upper lobe and right lower lobe 2 mm lung nodules-would recommend surveillance imaging; order at next visit.  Thank you, Ms.Clark NP for allowing me to participate in the care of your pleasant patient. Please do not hesitate to contact me with questions or concerns in the interim.   # DISPOSITION: # labs-cbc/cmp-' T review;  B12/ folate/ LDH/reticulocyte count[ordered]  # Follow up in 3 months; MD; labs-cbc]- Dr.B   All questions were answered. The patient knows to call the  clinic with any problems, questions or concerns.    Cammie Sickle, MD 10/09/2019 9:38 PM

## 2019-10-04 LAB — HAPTOGLOBIN: Haptoglobin: 177 mg/dL (ref 33–346)

## 2019-10-29 ENCOUNTER — Telehealth: Payer: Self-pay | Admitting: Primary Care

## 2019-10-29 DIAGNOSIS — G8929 Other chronic pain: Secondary | ICD-10-CM

## 2019-10-29 NOTE — Telephone Encounter (Signed)
Message left for patient to return my call.  

## 2019-10-29 NOTE — Telephone Encounter (Signed)
Please notify patient that we will certainly try to get her in prior to her medication lapse, but I cannot guarantee it. She may want to start reducing her dose in order to conserve her pills.  What is she taking hydrocodone for? Where is her pain?

## 2019-10-29 NOTE — Telephone Encounter (Signed)
Patient called.  Patient said she has been seeing Dr.Plumber,[facility],[place] for pain management. Patient said Dr.Plumber sent out a letter that they will no longer be doing pain management.  Patient said she runs out of her medication on 12/03/19. Patient wants to be referred to pain management and she'd like to be seen before her medication runs out. Patient said [place] or [place], preferably [place].

## 2019-10-30 NOTE — Telephone Encounter (Signed)
Spoken and notified patient of Maria Millers comments. Patient stated that it is for a chronic lower back pain, she has been taking this for 6 years.

## 2019-10-30 NOTE — Telephone Encounter (Signed)
Noted, referral placed.  

## 2019-11-07 DIAGNOSIS — I129 Hypertensive chronic kidney disease with stage 1 through stage 4 chronic kidney disease, or unspecified chronic kidney disease: Secondary | ICD-10-CM | POA: Insufficient documentation

## 2019-11-26 ENCOUNTER — Other Ambulatory Visit: Payer: Self-pay | Admitting: Primary Care

## 2019-11-26 DIAGNOSIS — G4700 Insomnia, unspecified: Secondary | ICD-10-CM

## 2019-11-27 NOTE — Telephone Encounter (Signed)
Pt calling requesting refill be sent in ASAP. Pt states that she requested this refill from the pharmacy about 1 week ago, I advised that we just received her request from the pharmacy yesterday.  Aware that I will send the request to Anda Kraft to address. Pt states that she has been out of her medication for a few days now. She has been taking Melatonin at night in place of the Trazodone which does not work as well.   Please advise, thanks.

## 2019-11-28 ENCOUNTER — Other Ambulatory Visit: Payer: Self-pay | Admitting: Primary Care

## 2019-11-28 DIAGNOSIS — I700 Atherosclerosis of aorta: Secondary | ICD-10-CM

## 2019-11-28 NOTE — Telephone Encounter (Signed)
Noted. Refill sent.

## 2019-12-11 ENCOUNTER — Telehealth: Payer: Self-pay | Admitting: *Deleted

## 2019-12-11 DIAGNOSIS — G894 Chronic pain syndrome: Secondary | ICD-10-CM | POA: Insufficient documentation

## 2019-12-11 DIAGNOSIS — Z79899 Other long term (current) drug therapy: Secondary | ICD-10-CM | POA: Insufficient documentation

## 2019-12-11 DIAGNOSIS — Z789 Other specified health status: Secondary | ICD-10-CM | POA: Insufficient documentation

## 2019-12-11 DIAGNOSIS — M899 Disorder of bone, unspecified: Secondary | ICD-10-CM | POA: Insufficient documentation

## 2019-12-11 NOTE — Progress Notes (Signed)
Patient: [name]  Service Category: E/M  Provider: Gaspar Cola, MD  DOB: 05/01/1961  DOS: 12/12/2019  Referring Provider: Pleas Koch, NP  MRN: [id number]  Setting: Ambulatory outpatient  PCP: Pleas Koch, NP  Type: New Patient  Specialty: Interventional Pain Management    Location: Office  Delivery: Face-to-face     Primary Reason(s) for Visit: Encounter for initial evaluation of one or more chronic problems (new to examiner) potentially causing chronic pain, and posing a threat to normal musculoskeletal function. (Level of risk: High) CC: Back Pain (lower)  HPI  [name] is a 58 y.o. year old, female patient, contacted today for an initial evaluation of her chronic pain. She has Hypothyroidism; Alcohol abuse; Protein-calorie malnutrition, severe ([place]); HTN (hypertension); Lung nodules; COPD (chronic obstructive pulmonary disease) ([place]); Hypercalcemia; Macrocytosis; Orthostasis; Chronic abdominal pain; Insomnia; Cramps of lower extremity; Aortic atherosclerosis ([place]); GERD (gastroesophageal reflux disease); Benign hypertensive kidney disease with chronic kidney disease; Chronic bilateral low back pain without sciatica; Stage 3 chronic kidney disease; Acute respiratory failure with hypoxia ([place]); Chronic obstructive pulmonary disease ([place]); COPD with acute exacerbation ([place]); Acquired hypothyroidism; Insomnia due to other mental disorder; Lesion of epiglottis; Chronic pain syndrome; Pharmacologic therapy; Disorder of skeletal system; Problems influencing health status; Lumbar facet syndrome (Bilateral) (R>L); and Chronic sacroiliac joint pain (Left) on their problem list.   Pain Assessment: Location: Lower Back Radiating: denies Onset: More than a month ago Duration: Chronic pain Quality: Sharp Severity: 8 /10 (subjective, self-reported pain score)  Note: Reported level is compatible with observation.                         When using our objective Pain Scale, levels  between 6 and 10/10 are said to belong in an emergency room, as it progressively worsens from a 6/10, described as severely limiting, requiring emergency care not usually available at an outpatient pain management facility. At a 6/10 level, communication becomes difficult and requires great effort. Assistance to reach the emergency department may be required. Facial flushing and profuse sweating along with potentially dangerous increases in heart rate and blood pressure will be evident. Effect on ADL: none Timing: Intermittent Modifying factors: medicatiosn BP: (!) 175/64   HR: 85  Onset and Duration: Date of onset: 8 years and Present longer than 3 months Cause of pain: Unknown Severity: Getting worse, NAS-11 at its worse: 10/10, NAS-11 at its best: 8/10, NAS-11 now: 10/10 and NAS-11 on the average: 8/10 Timing: Morning and Night Aggravating Factors: Lifiting, Squatting, Stooping , Walking and Working Alleviating Factors: Medications Associated Problems: Pain that wakes patient up Quality of Pain: Deep, Exhausting, Sharp, Shooting and Uncomfortable Previous Examinations or Tests: Bone scan, CT scan and X-rays Previous Treatments: Chiropractic manipulations, Physical Therapy and Stretching exercises  According to the patient she comes then referred to our clinics to get some medication for her pain.  She describes her primary pain has been that of the lower back, bilaterally, (R>L).  She denies any prior back surgeries, recent x-rays, but does admit to having had some physical therapy approximately 2 years ago at [facility].  She denies any prior nerve blocks or joint injections.  She specifically denies any lower extremity pain, hip pain, or knee pain.  She indicates previously being managed at the [facility] at [address], [place], [place] [address], telephone number [phone].  During today's visit the provocative physical exam was positive for  bilateral  lumbar facet pain upon hyperextension and rotation of the lumbar spine.  Simple hyperextension of the spine yielded pain in the midline of the lower back.  Provocative Patrick maneuver was positive only on the left side for sacroiliac joint pain.  The patient was able to toe walk and heel walk without any problems.  Straight leg raise was negative bilaterally with adequate range of motion of both lower extremities.  Range of motion of all major joints of the lower extremity was within normal limits.  DTRs were +2 and equal bilaterally for the patella tendon and the Achilles tendon.  No evidence of any lumbar radiculopathy affecting the lower lumbar region (L4-S1).  The patient was informed today that my specialty is interventional pain management and that I will be looking at her case, based on what is it that I can offer in terms of those therapies.  Patient was informed that I do not take patients for medication management only.  If need be and that what she is interested in, I will provide some recommendations regarding her medications.   Historic Controlled Substance Pharmacotherapy Review  Current opioid analgesics: Hydrocodone/APAP 10/325 1 tablet p.o. every 8 hours (30 mg/day of hydrocodone) (30 MME/day) Highest recorded MME/day: 45 mg/day MME/day: 30 mg/day   Historical Monitoring: The patient  reports no history of drug use. List of all UDS Test(s): Lab Results  Component Value Date   COCAINSCRNUR NONE DETECTED 07/17/2013   COCAINSCRNUR NONE DETECTED 01/10/2013   THCU NONE DETECTED 07/17/2013   THCU NONE DETECTED 01/10/2013   ETH <11 01/10/2013   List of other Serum/Urine Drug Screening Test(s):  Lab Results  Component Value Date   COCAINSCRNUR NONE DETECTED 07/17/2013   COCAINSCRNUR NONE DETECTED 01/10/2013   THCU NONE DETECTED 07/17/2013   THCU NONE DETECTED 01/10/2013   ETH <11 01/10/2013   Historical Background Evaluation: [place] PMP: PDMP reviewed during this encounter. Two  (2) year initial data search conducted.             PMP NARX Score Report:  Narcotic: 410 Sedative: 240 Stimulant: 000 Risk Assessment Profile: PMP NARX Overdose Risk Score: 250  Pharmacologic Plan: As per protocol, I have not taken over any controlled substance management, pending the results of ordered tests and/or consults.            Initial impression: Pending review of available data and ordered tests.  Meds   Current Outpatient Medications:    albuterol (VENTOLIN HFA) 108 (90 Base) MCG/ACT inhaler, TAKE 2 PUFFS BY MOUTH EVERY 6 HOURS AS NEEDED FOR WHEEZE, Disp: , Rfl:    aspirin EC 81 MG tablet, Take 81 mg by mouth daily., Disp: , Rfl:    Biotin 5000 MCG TABS, Take by mouth daily., Disp: , Rfl:    Cholecalciferol (VITAMIN D-3) 5000 UNITS TABS, Take 10,000 Units by mouth daily after breakfast., Disp: , Rfl:    Cyanocobalamin (B-12) 5000 MCG CAPS, Take by mouth daily., Disp: , Rfl:    diclofenac Sodium (VOLTAREN) 1 % GEL, APPLY TO THUMB TO 3 TIMES A DAY AS NEEDED FOR PAIN., Disp: , Rfl:    fluticasone furoate-vilanterol (BREO ELLIPTA) 200-25 MCG/INH AEPB, Inhale 1 puff into the lungs daily., Disp: 60 each, Rfl: 5   HYDROcodone-acetaminophen (NORCO) 10-325 MG tablet, Take 1 tablet by mouth 3 (three) times daily as needed., Disp: , Rfl:    levothyroxine (SYNTHROID, LEVOTHROID) 50 MCG tablet, Take 50 mcg by mouth daily before breakfast., Disp: , Rfl:  lisinopril (ZESTRIL) 10 MG tablet, Take 10 mg by mouth daily., Disp: , Rfl:    ondansetron (ZOFRAN-ODT) 8 MG disintegrating tablet, SMARTSIG:1 Tablet(s) By Mouth Every 12 Hours PRN, Disp: , Rfl:    pantoprazole (PROTONIX) 20 MG tablet, Take 20 mg by mouth daily. , Disp: , Rfl: 4   Probiotic Product (PROBIOTIC-10 PO), Take by mouth daily., Disp: , Rfl:    rosuvastatin (CRESTOR) 5 MG tablet, Take 1 tablet (5 mg total) by mouth every evening. For cholesterol., Disp: 90 tablet, Rfl: 3   traZODone (DESYREL) 50 MG tablet, TAKE 1  TABLET (50 MG TOTAL) BY MOUTH AT BEDTIME AS NEEDED FOR SLEEP., Disp: 90 tablet, Rfl: 1  ROS  Cardiovascular: High blood pressure Pulmonary or Respiratory: Smoking Neurological: No reported neurological signs or symptoms such as seizures, abnormal skin sensations, urinary and/or fecal incontinence, being born with an abnormal open spine and/or a tethered spinal cord Psychological-Psychiatric: No reported psychological or psychiatric signs or symptoms such as difficulty sleeping, anxiety, depression, delusions or hallucinations (schizophrenial), mood swings (bipolar disorders) or suicidal ideations or attempts Gastrointestinal: No reported gastrointestinal signs or symptoms such as vomiting or evacuating blood, reflux, heartburn, alternating episodes of diarrhea and constipation, inflamed or scarred liver, or pancreas or irrregular and/or infrequent bowel movements Genitourinary: No reported renal or genitourinary signs or symptoms such as difficulty voiding or producing urine, peeing blood, non-functioning kidney, kidney stones, difficulty emptying the bladder, difficulty controlling the flow of urine, or chronic kidney disease Hematological: No reported hematological signs or symptoms such as prolonged bleeding, low or poor functioning platelets, bruising or bleeding easily, hereditary bleeding problems, low energy levels due to low hemoglobin or being anemic Endocrine: No reported endocrine signs or symptoms such as high or low blood sugar, rapid heart rate due to high thyroid levels, obesity or weight gain due to slow thyroid or thyroid disease Rheumatologic: No reported rheumatological signs and symptoms such as fatigue, joint pain, tenderness, swelling, redness, heat, stiffness, decreased range of motion, with or without associated rash Musculoskeletal: Negative for myasthenia gravis, muscular dystrophy, multiple sclerosis or malignant hyperthermia Work History: Working full time  Allergies  Ms.  Gaeta has No Known Allergies.  Laboratory Chemistry Profile   Renal Lab Results  Component Value Date   BUN 16 10/03/2019   CREATININE 1.29 (H) 10/03/2019   LABCREA 150.6 07/16/2013   GFR 38.09 (L) 09/12/2019   GFRAA 53 (L) 10/03/2019   GFRNONAA 46 (L) 10/03/2019   PROTEINUR NEGATIVE 12/18/2014     Electrolytes Lab Results  Component Value Date   NA 136 10/03/2019   K 4.5 10/03/2019   CL 96 (L) 10/03/2019   CALCIUM 9.7 10/03/2019   MG 2.3 09/12/2019   PHOS 3.0 07/16/2013     Hepatic Lab Results  Component Value Date   AST 23 10/03/2019   ALT 16 10/03/2019   ALBUMIN 4.6 10/03/2019   ALKPHOS 75 10/03/2019   AMYLASE 50 02/03/2009   LIPASE 28 07/16/2013   AMMONIA 21 01/10/2013     ID Lab Results  Component Value Date   HIV Non Reactive 01/03/2018   MRSAPCR NEGATIVE 01/07/2012     Bone No results found.   Endocrine Lab Results  Component Value Date   GLUCOSE 89 10/03/2019   GLUCOSEU NEGATIVE 12/18/2014   TSH 0.99 09/12/2019   FREET4 1.22 (H) 08/15/2017     Neuropathy Lab Results  Component Value Date   VITAMINB12 >7,500 (H) 10/03/2019   FOLATE 47.0 10/03/2019   HIV Non  Reactive 01/03/2018     CNS No results found.   Inflammation (CRP: Acute   ESR: Chronic) Lab Results  Component Value Date   CRP <0.8 08/18/2017   ESRSEDRATE 2 08/18/2017   LATICACIDVEN 0.5 07/19/2013     Rheumatology Lab Results  Component Value Date   RF <14 05/07/2016     Coagulation Lab Results  Component Value Date   INR 1.04 07/16/2013   LABPROT 13.4 07/16/2013   APTT 34 07/16/2013   PLT 272 10/03/2019     Cardiovascular Lab Results  Component Value Date   CKTOTAL 20 (L) 12/29/2012   CKMB < 0.5 (L) 12/29/2012   TROPONINI < 0.02 06/09/2014   HGB 14.7 10/03/2019   HCT 43.3 10/03/2019     Screening Lab Results  Component Value Date   MRSAPCR NEGATIVE 01/07/2012   HIV Non Reactive 01/03/2018     Cancer No results found.   Allergens No results  found.     Note: Lab results reviewed.  Imaging Review   Complexity Note: No results found under the [facility] electronic medical record.                        PFSH  Drug: Ms. Quirion  reports no history of drug use. Alcohol:  reports current alcohol use of about 35.0 standard drinks of alcohol per week. Tobacco:  reports that she has been smoking cigarettes. She has a 84.00 pack-year smoking history. She has never used smokeless tobacco. Medical:  has a past medical history of Acute diverticulitis (07/16/2013), Acute renal failure ([place]) (07/16/2013), Arthritis, Colitis (07/16/2013), Diverticulitis, Elevated CEA (08/15/2017), GERD (gastroesophageal reflux disease), GIB (gastrointestinal bleeding) (01/07/2012), Hypertension, Hypothyroidism, Lesion of epiglottis, Macrocytosis without anemia (08/15/2017), and Orthostasis (08/22/2017). Family: family history includes Breast cancer in her mother; Kidney disease in her father; Melanoma in her mother; Stomach cancer in her mother; Throat cancer in her mother.  Past Surgical History:  Procedure Laterality Date   ABDOMINAL HYSTERECTOMY     APPENDECTOMY     CHOLECYSTECTOMY     COLONOSCOPY  01/09/2012   Procedure: COLONOSCOPY;  Surgeon: Lear Ng, MD;  Location: [place];  Service: Endoscopy;  Laterality: N/A;   COLONOSCOPY WITH PROPOFOL N/A 08/25/2017   Procedure: COLONOSCOPY WITH PROPOFOL;  Surgeon: Jonathon Bellows, MD;  Location[facility] ENDOSCOPY;  Service: Gastroenterology;  Laterality: N/A;   ESOPHAGOGASTRODUODENOSCOPY  01/07/2012   Procedure: ESOPHAGOGASTRODUODENOSCOPY (EGD);  Surgeon: Winfield Cunas., MD;  Location[facility] ENDOSCOPY;  Service: Endoscopy;  Laterality: N/A;   ESOPHAGOGASTRODUODENOSCOPY (EGD) WITH PROPOFOL N/A 08/25/2017   Procedure: ESOPHAGOGASTRODUODENOSCOPY (EGD) WITH PROPOFOL;  Surgeon: Jonathon Bellows, MD;  Location: [facility] ENDOSCOPY;  Service: Gastroenterology;  Laterality: N/A;   VIDEO BRONCHOSCOPY Bilateral 05/14/2016    Procedure: VIDEO BRONCHOSCOPY WITHOUT FLUORO;  Surgeon: Collene Gobble, MD;  Location: [place];  Service: Cardiopulmonary;  Laterality: Bilateral;   Active Ambulatory Problems    Diagnosis Date Noted   Hypothyroidism 03/03/2010   Alcohol abuse 01/07/2012   Protein-calorie malnutrition, severe ([place]) 01/12/2013   HTN (hypertension) 07/16/2013   Lung nodules 05/07/2016   COPD (chronic obstructive pulmonary disease) ([place]) 05/07/2016   Hypercalcemia 08/15/2017   Macrocytosis 08/15/2017   Orthostasis 08/22/2017   Chronic abdominal pain 01/03/2018   Insomnia 09/12/2019   Cramps of lower extremity 09/12/2019   Aortic atherosclerosis ([place]) 09/12/2019   GERD (gastroesophageal reflux disease) 09/12/2019   Benign hypertensive kidney disease with chronic kidney disease 11/07/2019   Chronic bilateral low  back pain without sciatica 08/16/2018   Stage 3 chronic kidney disease 08/16/2018   Acute respiratory failure with hypoxia ([place]) 06/05/2019   Chronic obstructive pulmonary disease ([place]) 05/07/2016   COPD with acute exacerbation ([place]) 06/05/2019   Acquired hypothyroidism 06/05/2019   Insomnia due to other mental disorder 08/16/2018   Lesion of epiglottis 08/10/2018   Chronic pain syndrome 12/11/2019   Pharmacologic therapy 12/11/2019   Disorder of skeletal system 12/11/2019   Problems influencing health status 12/11/2019   Lumbar facet syndrome (Bilateral) (R>L) 12/12/2019   Chronic sacroiliac joint pain (Left) 12/12/2019   Resolved Ambulatory Problems    Diagnosis Date Noted   ANXIETY 03/03/2010   HYPERTENSION/ hypotension 03/03/2010   Blood in stool 03/03/2010   CHEST PAIN, ATYPICAL, HX OF 03/03/2010   Fracture, rib 01/10/2011   GIB (gastrointestinal bleeding) 01/07/2012   Hypokalemia 01/07/2012   Anemia due to blood loss, acute 01/07/2012   Leukocytosis 01/07/2012   AKI (acute kidney injury) ([place]) 01/11/2013   Colitis 07/16/2013   Acute  renal failure ([place]) 07/16/2013   Acute diverticulitis 07/16/2013   Lesion of epiglottis    Weight loss 08/15/2017   Elevated CEA 08/15/2017   Past Medical History:  Diagnosis Date   Arthritis    Diverticulitis    Hypertension    Macrocytosis without anemia 08/15/2017   Assessment  Primary Diagnosis & Pertinent Problem List: The primary encounter diagnosis was Chronic pain syndrome. Diagnoses of Pharmacologic therapy, Disorder of skeletal system, Problems influencing health status, Lumbar facet syndrome (Bilateral) (R>L), and Chronic sacroiliac joint pain (Left) were also pertinent to this visit.  Visit Diagnosis (New problems to examiner): 1. Chronic pain syndrome   2. Pharmacologic therapy   3. Disorder of skeletal system   4. Problems influencing health status   5. Lumbar facet syndrome (Bilateral) (R>L)   6. Chronic sacroiliac joint pain (Left)    Plan of Care (Initial workup plan)  Note: Ms. Bines was reminded that as per protocol, today's visit has been an evaluation only. We have not taken over the patient's controlled substance management.  Problem-specific plan: No problem-specific Assessment & Plan notes found for this encounter.   Lab Orders     Compliance Drug Analysis, Ur     Comp. Metabolic Panel (12)     Magnesium     Vitamin B12     Sedimentation rate     25-Hydroxy vitamin D Lcms D2+D3     C-reactive protein  Imaging Orders     DG Lumbar Spine Complete W/Bend     DG Si Joints Referral Orders  No referral(s) requested today   Procedure Orders    No procedure(s) ordered today   Pharmacotherapy (current): Medications ordered:  No orders of the defined types were placed in this encounter.    Pharmacological management options:  Opioid Analgesics: The patient was informed that there is no guarantee that she would be a candidate for opioid analgesics. The decision will be made following CDC guidelines. This decision will be based on the  results of diagnostic studies, as well as Ms. Nase's risk profile.   Membrane stabilizer: To be determined at a later time  Muscle relaxant: To be determined at a later time  NSAID: To be determined at a later time  Other analgesic(s): To be determined at a later time   Interventional management options: Ms. Coryell was informed that there is no guarantee that she would be a candidate for interventional therapies. The decision will be based on  the results of diagnostic studies, as well as Ms. Turgeon's risk profile.  Procedure(s) under consideration:  Diagnostic bilateral lumbar facet block #1  Possible bilateral lumbar facet RFA  Diagnostic left sacroiliac joint block #1  Possible left sacroiliac joint RFA    Provider-requested follow-up: Return for (2V-39mn), (s/p Tests) to evaluate x-rays and lab work.  Future Appointments  Date Time Provider [place] 12/13/2019  7:50 AM [facility]  01/02/2020  2:15 PM [facility] None  01/02/2020  2:45 PM BCammie Sickle MD [facility] None  01/14/2020  1:15 PM NMilinda Pointer MD [facility] None   Total duration of encounter: 47 minutes.  Primary Care Physician: CPleas Koch NP Note by: FGaspar Cola MD Date: 12/12/2019; Time: 4:33 PM

## 2019-12-12 ENCOUNTER — Other Ambulatory Visit: Payer: Self-pay

## 2019-12-12 ENCOUNTER — Ambulatory Visit: Payer: 59 | Attending: Pain Medicine | Admitting: Pain Medicine

## 2019-12-12 ENCOUNTER — Encounter: Payer: Self-pay | Admitting: Pain Medicine

## 2019-12-12 VITALS — BP 175/64 | HR 85 | Temp 97.9°F | Resp 16 | Ht <= 58 in | Wt 88.0 lb

## 2019-12-12 DIAGNOSIS — M899 Disorder of bone, unspecified: Secondary | ICD-10-CM | POA: Diagnosis not present

## 2019-12-12 DIAGNOSIS — G8929 Other chronic pain: Secondary | ICD-10-CM | POA: Insufficient documentation

## 2019-12-12 DIAGNOSIS — M533 Sacrococcygeal disorders, not elsewhere classified: Secondary | ICD-10-CM | POA: Insufficient documentation

## 2019-12-12 DIAGNOSIS — Z789 Other specified health status: Secondary | ICD-10-CM | POA: Insufficient documentation

## 2019-12-12 DIAGNOSIS — M47816 Spondylosis without myelopathy or radiculopathy, lumbar region: Secondary | ICD-10-CM | POA: Insufficient documentation

## 2019-12-12 DIAGNOSIS — Z79899 Other long term (current) drug therapy: Secondary | ICD-10-CM | POA: Diagnosis present

## 2019-12-12 DIAGNOSIS — G894 Chronic pain syndrome: Secondary | ICD-10-CM

## 2019-12-12 NOTE — Progress Notes (Signed)
Safety precautions to be maintained throughout the outpatient stay will include: orient to surroundings, keep bed in low position, maintain call bell within reach at all times, provide assistance with transfer out of bed and ambulation.  

## 2019-12-12 NOTE — Patient Instructions (Addendum)
____________________________________________________________________________________________  General Risks and Possible Complications  Patient Responsibilities: It is important that you read this as it is part of your informed consent. It is our duty to inform you of the risks and possible complications associated with treatments offered to you. It is your responsibility as a patient to read this and to ask questions about anything that is not clear or that you believe was not covered in this document.  Patient's Rights: You have the right to refuse treatment. You also have the right to change your mind, even after initially having agreed to have the treatment done. However, under this last option, if you wait until the last second to change your mind, you may be charged for the materials used up to that point.  Introduction: Medicine is not an exact science. Everything in Medicine, including the lack of treatment(s), carries the potential for danger, harm, or loss (which is by definition: Risk). In Medicine, a complication is a secondary problem, condition, or disease that can aggravate an already existing one. All treatments carry the risk of possible complications. The fact that a side effects or complications occurs, does not imply that the treatment was conducted incorrectly. It must be clearly understood that these can happen even when everything is done following the highest safety standards.  No treatment: You can choose not to proceed with the proposed treatment alternative. The "PRO(s)" would include: avoiding the risk of complications associated with the therapy. The "CON(s)" would include: not getting any of the treatment benefits. These benefits fall under one of three categories: diagnostic; therapeutic; and/or palliative. Diagnostic benefits include: getting information which can ultimately lead to improvement of the disease or symptom(s). Therapeutic benefits are those associated with the  successful treatment of the disease. Finally, palliative benefits are those related to the decrease of the primary symptoms, without necessarily curing the condition (example: decreasing the pain from a flare-up of a chronic condition, such as incurable terminal cancer).  General Risks and Complications: These are associated to most interventional treatments. They can occur alone, or in combination. They fall under one of the following six (6) categories: no benefit or worsening of symptoms; bleeding; infection; nerve damage; allergic reactions; and/or death. 1. No benefits or worsening of symptoms: In Medicine there are no guarantees, only probabilities. No healthcare provider can ever guarantee that a medical treatment will work, they can only state the probability that it may. Furthermore, there is always the possibility that the condition may worsen, either directly, or indirectly, as a consequence of the treatment. 2. Bleeding: This is more common if the patient is taking a blood thinner, either prescription or over the counter (example: Goody Powders, Fish oil, Aspirin, Garlic, etc.), or if suffering a condition associated with impaired coagulation (example: Hemophilia, cirrhosis of the liver, low platelet counts, etc.). However, even if you do not have one on these, it can still happen. If you have any of these conditions, or take one of these drugs, make sure to notify your treating physician. 3. Infection: This is more common in patients with a compromised immune system, either due to disease (example: diabetes, cancer, human immunodeficiency virus [HIV], etc.), or due to medications or treatments (example: therapies used to treat cancer and rheumatological diseases). However, even if you do not have one on these, it can still happen. If you have any of these conditions, or take one of these drugs, make sure to notify your treating physician. 4. Nerve Damage: This is more common when the   treatment is  an invasive one, but it can also happen with the use of medications, such as those used in the treatment of cancer. The damage can occur to small secondary nerves, or to large primary ones, such as those in the spinal cord and brain. This damage may be temporary or permanent and it may lead to impairments that can range from temporary numbness to permanent paralysis and/or brain death. 5. Allergic Reactions: Any time a substance or material comes in contact with our body, there is the possibility of an allergic reaction. These can range from a mild skin rash (contact dermatitis) to a severe systemic reaction (anaphylactic reaction), which can result in death. 6. Death: In general, any medical intervention can result in death, most of the time due to an unforeseen complication. ____________________________________________________________________________________________   ______________________________________________________________________________________________  Specialty Pain Scale  Introduction:  There are significant differences in how pain is reported. The word pain usually refers to physical pain, but it is also a common synonym of suffering. The medical community uses a scale from 0 (zero) to 10 (ten) to report pain level. Zero (0) is described as "no pain", while ten (10) is described as "the worse pain you can imagine". The problem with this scale is that physical pain is reported along with suffering. Suffering refers to mental pain, or more often yet it refers to any unpleasant feeling, emotion or aversion associated with the perception of harm or threat of harm. It is the psychological component of pain.  Pain Specialists prefer to separate the two components. The pain scale used by this practice is the Verbal Numerical Rating Scale (VNRS-11). This scale is for the physical pain only. DO NOT INCLUDE how your pain psychologically affects you. This scale is for adults 57 years of age and  older. It has 11 (eleven) levels. The 1st level is 0/10. This means: "right now, I have no pain". In the context of pain management, it also means: "right now, my physical pain is under control with the current therapy".  General Information:  The scale should reflect your current level of pain. Unless you are specifically asked for the level of your worst pain, or your average pain. If you are asked for one of these two, then it should be understood that it is over the past 24 hours.  Levels 1 (one) through 5 (five) are described below, and can be treated as an outpatient. Ambulatory pain management facilities such as ours are more than adequate to treat these levels. Levels 6 (six) through 10 (ten) are also described below, however, these must be treated as a hospitalized patient. While levels 6 (six) and 7 (seven) may be evaluated at an urgent care facility, levels 8 (eight) through 10 (ten) constitute medical emergencies and as such, they belong in a hospital's emergency department. When having these levels (as described below), do not come to our office. Our facility is not equipped to manage these levels. Go directly to an urgent care facility or an emergency department to be evaluated.  Definitions:  Activities of Daily Living (ADL): Activities of daily living (ADL or ADLs) is a term used in healthcare to refer to people's daily self-care activities. Health professionals often use a person's ability or inability to perform ADLs as a measurement of their functional status, particularly in regard to people post injury, with disabilities and the elderly. There are two ADL levels: Basic and Instrumental. Basic Activities of Daily Living (BADL  or BADLs) consist of self-care  tasks that include: Bathing and showering; personal hygiene and grooming (including brushing/combing/styling hair); dressing; Toilet hygiene (getting to the toilet, cleaning oneself, and getting back up); eating and self-feeding (not  including cooking or chewing and swallowing); functional mobility, often referred to as "transferring", as measured by the ability to walk, get in and out of bed, and get into and out of a chair; the broader definition (moving from one place to another while performing activities) is useful for people with different physical abilities who are still able to get around independently. Basic ADLs include the things many people do when they get up in the morning and get ready to go out of the house: get out of bed, go to the toilet, bathe, dress, groom, and eat. On the average, loss of function typically follows a particular order. Hygiene is the first to go, followed by loss of toilet use and locomotion. The last to go is the ability to eat. When there is only one remaining area in which the person is independent, there is a 62.9% chance that it is eating and only a 3.5% chance that it is hygiene. Instrumental Activities of Daily Living (IADL or IADLs) are not necessary for fundamental functioning, but they let an individual live independently in a community. IADL consist of tasks that include: cleaning and maintaining the house; home establishment and maintenance; care of others (including selecting and supervising caregivers); care of pets; child rearing; managing money; managing financials (investments, etc.); meal preparation and cleanup; shopping for groceries and necessities; moving within the community; safety procedures and emergency responses; health management and maintenance (taking prescribed medications); and using the telephone or other form of communication.  Instructions:  Most patients tend to report their pain as a combination of two factors, their physical pain and their psychosocial pain. This last one is also known as "suffering" and it is reflection of how physical pain affects you socially and psychologically. From now on, report them separately.  From this point on, when asked to report  your pain level, report only your physical pain. Use the following table for reference.  Pain Clinic Pain Levels (0-5/10)  Pain Level Score  Description  No Pain 0   Mild pain 1 Nagging, annoying, but does not interfere with basic activities of daily living (ADL). Patients are able to eat, bathe, get dressed, toileting (being able to get on and off the toilet and perform personal hygiene functions), transfer (move in and out of bed or a chair without assistance), and maintain continence (able to control bladder and bowel functions). Blood pressure and heart rate are unaffected. A normal heart rate for a healthy adult ranges from 60 to 100 bpm (beats per minute).   Mild to moderate pain 2 Noticeable and distracting. Impossible to hide from other people. More frequent flare-ups. Still possible to adapt and function close to normal. It can be very annoying and may have occasional stronger flare-ups. With discipline, patients may get used to it and adapt.   Moderate pain 3 Interferes significantly with activities of daily living (ADL). It becomes difficult to feed, bathe, get dressed, get on and off the toilet or to perform personal hygiene functions. Difficult to get in and out of bed or a chair without assistance. Very distracting. With effort, it can be ignored when deeply involved in activities.   Moderately severe pain 4 Impossible to ignore for more than a few minutes. With effort, patients may still be able to manage work or participate in  some social activities. Very difficult to concentrate. Signs of autonomic nervous system discharge are evident: dilated pupils (mydriasis); mild sweating (diaphoresis); sleep interference. Heart rate becomes elevated (>115 bpm). Diastolic blood pressure (lower number) rises above 100 mmHg. Patients find relief in laying down and not moving.   Severe pain 5 Intense and extremely unpleasant. Associated with frowning face and frequent crying. Pain overwhelms the  senses.  Ability to do any activity or maintain social relationships becomes significantly limited. Conversation becomes difficult. Pacing back and forth is common, as getting into a comfortable position is nearly impossible. Pain wakes you up from deep sleep. Physical signs will be obvious: pupillary dilation; increased sweating; goosebumps; brisk reflexes; cold, clammy hands and feet; nausea, vomiting or dry heaves; loss of appetite; significant sleep disturbance with inability to fall asleep or to remain asleep. When persistent, significant weight loss is observed due to the complete loss of appetite and sleep deprivation.  Blood pressure and heart rate becomes significantly elevated. Caution: If elevated blood pressure triggers a pounding headache associated with blurred vision, then the patient should immediately seek attention at an urgent or emergency care unit, as these may be signs of an impending stroke.    Emergency Department Pain Levels (6-10/10)  Emergency Room Pain 6 Severely limiting. Requires emergency care and should not be seen or managed at an outpatient pain management facility. Communication becomes difficult and requires great effort. Assistance to reach the emergency department may be required. Facial flushing and profuse sweating along with potentially dangerous increases in heart rate and blood pressure will be evident.   Distressing pain 7 Self-care is very difficult. Assistance is required to transport, or use restroom. Assistance to reach the emergency department will be required. Tasks requiring coordination, such as bathing and getting dressed become very difficult.   Disabling pain 8 Self-care is no longer possible. At this level, pain is disabling. The individual is unable to do even the most "basic" activities such as walking, eating, bathing, dressing, transferring to a bed, or toileting. Fine motor skills are lost. It is difficult to think clearly.   Incapacitating pain  9 Pain becomes incapacitating. Thought processing is no longer possible. Difficult to remember your own name. Control of movement and coordination are lost.   The worst pain imaginable 10 At this level, most patients pass out from pain. When this level is reached, collapse of the autonomic nervous system occurs, leading to a sudden drop in blood pressure and heart rate. This in turn results in a temporary and dramatic drop in blood flow to the brain, leading to a loss of consciousness. Fainting is one of the body's self defense mechanisms. Passing out puts the brain in a calmed state and causes it to shut down for a while, in order to begin the healing process.    Summary: 1.   Refer to this scale when providing [place] with your pain level. 2.   Be accurate and careful when reporting your pain level. This will help with your care. 3.   Over-reporting your pain level will lead to loss of credibility. 4.   Even a level of 1/10 means that there is pain and will be treated at our facility. 5.   High, inaccurate reporting will be documented as "Symptom Exaggeration", leading to loss of credibility and suspicions of possible secondary gains such as obtaining more narcotics, or wanting to appear disabled, for fraudulent reasons. 6.   Only pain levels of 5 or below will be seen  at our facility. 7.   Pain levels of 6 and above will be sent to the Emergency Department and the appointment cancelled.  ______________________________________________________________________________________________ Please get your x-rays as soon as possible

## 2019-12-13 ENCOUNTER — Telehealth: Payer: Self-pay | Admitting: Primary Care

## 2019-12-13 ENCOUNTER — Other Ambulatory Visit (INDEPENDENT_AMBULATORY_CARE_PROVIDER_SITE_OTHER): Payer: 59

## 2019-12-13 DIAGNOSIS — D7589 Other specified diseases of blood and blood-forming organs: Secondary | ICD-10-CM

## 2019-12-13 DIAGNOSIS — I700 Atherosclerosis of aorta: Secondary | ICD-10-CM

## 2019-12-13 DIAGNOSIS — G8929 Other chronic pain: Secondary | ICD-10-CM

## 2019-12-13 LAB — LIPID PANEL
Cholesterol: 160 mg/dL (ref 0–200)
HDL: 71.5 mg/dL (ref >39.00)
LDL Cholesterol: 75 mg/dL (ref 0–99)
NonHDL: 88.05
Total CHOL/HDL Ratio: 2
Triglycerides: 65 mg/dL (ref 0.0–149.0)
VLDL: 13 mg/dL (ref 0.0–40.0)

## 2019-12-13 LAB — HEPATIC FUNCTION PANEL
ALT: 17 U/L (ref 0–35)
AST: 24 U/L (ref 0–37)
Albumin: 4.8 g/dL (ref 3.5–5.2)
Alkaline Phosphatase: 77 U/L (ref 39–117)
Bilirubin, Direct: 0.1 mg/dL (ref 0.0–0.3)
Total Bilirubin: 0.5 mg/dL (ref 0.2–1.2)
Total Protein: 7.4 g/dL (ref 6.0–8.3)

## 2019-12-13 NOTE — Telephone Encounter (Signed)
Please notify patient that I strongly recommend she continue with her current pain management provider as it can take months to get in with someone new.   Unfortunately, I do not prescribe narcotics and will not be able to refill her narcotic medication. I'm happy to meet with her anytime to discuss other non narcotic options for pain if she does not wish to follow up with pain management.

## 2019-12-13 NOTE — Telephone Encounter (Signed)
Pt is requesting to be referred to a different pain clinic. She states she does not like the way she was treated at the one she went to yesterday. She is in a lot of pain after seeing him yesterday to the point she can hardly walk.

## 2019-12-13 NOTE — Telephone Encounter (Signed)
Pt is also requesting a prescription for something to help her with the pain.

## 2019-12-14 NOTE — Telephone Encounter (Signed)
Noted, referral placed.  

## 2019-12-14 NOTE — Telephone Encounter (Signed)
Spoken and notified patient of [name] comments. Patient stated that she will think about it but not right now. She stated that she still would like a referral to a new pain management.

## 2019-12-17 LAB — COMPLIANCE DRUG ANALYSIS, UR

## 2019-12-19 ENCOUNTER — Other Ambulatory Visit: Payer: Self-pay | Admitting: Nephrology

## 2019-12-19 DIAGNOSIS — N1832 Chronic kidney disease, stage 3b: Secondary | ICD-10-CM

## 2019-12-28 ENCOUNTER — Ambulatory Visit: Payer: 59

## 2020-01-02 ENCOUNTER — Other Ambulatory Visit: Payer: Self-pay

## 2020-01-02 ENCOUNTER — Encounter: Payer: Self-pay | Admitting: Internal Medicine

## 2020-01-02 ENCOUNTER — Other Ambulatory Visit: Payer: Self-pay | Admitting: *Deleted

## 2020-01-02 ENCOUNTER — Inpatient Hospital Stay: Payer: 59 | Attending: Internal Medicine

## 2020-01-02 ENCOUNTER — Inpatient Hospital Stay (HOSPITAL_BASED_OUTPATIENT_CLINIC_OR_DEPARTMENT_OTHER): Payer: 59 | Admitting: Internal Medicine

## 2020-01-02 VITALS — BP 101/50 | HR 86 | Temp 98.0°F | Resp 18 | Ht <= 58 in | Wt 93.0 lb

## 2020-01-02 DIAGNOSIS — N183 Chronic kidney disease, stage 3 unspecified: Secondary | ICD-10-CM | POA: Diagnosis not present

## 2020-01-02 DIAGNOSIS — Z803 Family history of malignant neoplasm of breast: Secondary | ICD-10-CM | POA: Diagnosis not present

## 2020-01-02 DIAGNOSIS — K219 Gastro-esophageal reflux disease without esophagitis: Secondary | ICD-10-CM | POA: Insufficient documentation

## 2020-01-02 DIAGNOSIS — Z800 Family history of malignant neoplasm of digestive organs: Secondary | ICD-10-CM | POA: Diagnosis not present

## 2020-01-02 DIAGNOSIS — Z7982 Long term (current) use of aspirin: Secondary | ICD-10-CM | POA: Diagnosis not present

## 2020-01-02 DIAGNOSIS — D72829 Elevated white blood cell count, unspecified: Secondary | ICD-10-CM

## 2020-01-02 DIAGNOSIS — Z90710 Acquired absence of both cervix and uterus: Secondary | ICD-10-CM | POA: Insufficient documentation

## 2020-01-02 DIAGNOSIS — I10 Essential (primary) hypertension: Secondary | ICD-10-CM | POA: Insufficient documentation

## 2020-01-02 DIAGNOSIS — E039 Hypothyroidism, unspecified: Secondary | ICD-10-CM | POA: Diagnosis not present

## 2020-01-02 DIAGNOSIS — Z801 Family history of malignant neoplasm of trachea, bronchus and lung: Secondary | ICD-10-CM | POA: Diagnosis not present

## 2020-01-02 DIAGNOSIS — Z9049 Acquired absence of other specified parts of digestive tract: Secondary | ICD-10-CM | POA: Diagnosis not present

## 2020-01-02 DIAGNOSIS — Z79899 Other long term (current) drug therapy: Secondary | ICD-10-CM | POA: Diagnosis not present

## 2020-01-02 DIAGNOSIS — D7589 Other specified diseases of blood and blood-forming organs: Secondary | ICD-10-CM

## 2020-01-02 DIAGNOSIS — R1084 Generalized abdominal pain: Secondary | ICD-10-CM

## 2020-01-02 DIAGNOSIS — Z7951 Long term (current) use of inhaled steroids: Secondary | ICD-10-CM | POA: Diagnosis not present

## 2020-01-02 DIAGNOSIS — F17210 Nicotine dependence, cigarettes, uncomplicated: Secondary | ICD-10-CM | POA: Insufficient documentation

## 2020-01-02 DIAGNOSIS — Z791 Long term (current) use of non-steroidal anti-inflammatories (NSAID): Secondary | ICD-10-CM | POA: Insufficient documentation

## 2020-01-02 LAB — CBC WITH DIFFERENTIAL/PLATELET
Abs Immature Granulocytes: 0.06 10*3/uL (ref 0.00–0.07)
Basophils Absolute: 0.1 10*3/uL (ref 0.0–0.1)
Basophils Relative: 1 %
Eosinophils Absolute: 0.3 10*3/uL (ref 0.0–0.5)
Eosinophils Relative: 2 %
HCT: 36.9 % (ref 36.0–46.0)
Hemoglobin: 12.6 g/dL (ref 12.0–15.0)
Immature Granulocytes: 1 %
Lymphocytes Relative: 15 %
Lymphs Abs: 1.9 10*3/uL (ref 0.7–4.0)
MCH: 36.3 pg — ABNORMAL HIGH (ref 26.0–34.0)
MCHC: 34.1 g/dL (ref 30.0–36.0)
MCV: 106.3 fL — ABNORMAL HIGH (ref 80.0–100.0)
Monocytes Absolute: 0.9 10*3/uL (ref 0.1–1.0)
Monocytes Relative: 7 %
Neutro Abs: 9.6 10*3/uL — ABNORMAL HIGH (ref 1.7–7.7)
Neutrophils Relative %: 74 %
Platelets: 278 10*3/uL (ref 150–400)
RBC: 3.47 MIL/uL — ABNORMAL LOW (ref 3.87–5.11)
RDW: 13.2 % (ref 11.5–15.5)
WBC: 12.9 10*3/uL — ABNORMAL HIGH (ref 4.0–10.5)
nRBC: 0 % (ref 0.0–0.2)

## 2020-01-02 LAB — HEPATIC FUNCTION PANEL
ALT: 18 U/L (ref 0–44)
AST: 39 U/L (ref 15–41)
Albumin: 3.7 g/dL (ref 3.5–5.0)
Alkaline Phosphatase: 67 U/L (ref 38–126)
Bilirubin, Direct: <0.1 mg/dL (ref 0.0–0.2)
Total Bilirubin: 0.5 mg/dL (ref 0.3–1.2)
Total Protein: 6.2 g/dL — ABNORMAL LOW (ref 6.5–8.1)

## 2020-01-02 LAB — BASIC METABOLIC PANEL
Anion gap: 4 — ABNORMAL LOW (ref 5–15)
BUN: 16 mg/dL (ref 6–20)
CO2: 28 mmol/L (ref 22–32)
Calcium: 8.8 mg/dL — ABNORMAL LOW (ref 8.9–10.3)
Chloride: 106 mmol/L (ref 98–111)
Creatinine, Ser: 1.38 mg/dL — ABNORMAL HIGH (ref 0.44–1.00)
GFR calc Af Amer: 49 mL/min — ABNORMAL LOW (ref >60)
GFR calc non Af Amer: 42 mL/min — ABNORMAL LOW (ref >60)
Glucose, Bld: 103 mg/dL — ABNORMAL HIGH (ref 70–99)
Potassium: 3.9 mmol/L (ref 3.5–5.1)
Sodium: 138 mmol/L (ref 135–145)

## 2020-01-02 LAB — AMYLASE: Amylase: 75 U/L (ref 28–100)

## 2020-01-02 LAB — LIPASE, BLOOD: Lipase: 28 U/L (ref 11–51)

## 2020-01-02 NOTE — Assessment & Plan Note (Addendum)
#   Macrocytosis- with out anemia [MCV 103; Hb 13]; hemoglobin today is 12.5.  Stable.   #Acute abdominal pain-question peritoneal signs.-unclear etiology.  Offered going to the emergency room patient declines.  Recommend checking LFTs lipase amylase.  Also recommend getting a stat CT of the abdomen pelvis; oral contrast no IV contrast.  #Chronic insufficiency-creatinine 1.4 GFR 43.  Recommend increase fluid intake hold IV dye.  # 2019- lung nodule-right upper lobe and right lower lobe 2 mm lung nodules-would recommend surveillance imaging; order at next visit.  #Borderline slightly low blood pressure; systolic 464; recommend holding lisinopril 10 mg at this time.  # DISPOSITION: will call with results/lisinopril..  # CT Ab/pelvis STAT.  # Follow up to be decided- Dr.B

## 2020-01-02 NOTE — Patient Instructions (Signed)
#   HOLD linisinpril [as your BP is slightly low]; will need to be re-started based on your follow up BPs.

## 2020-01-02 NOTE — Progress Notes (Signed)
[facility] NOTE  Patient Care Team: Pleas Koch, NP as PCP - General (Internal Medicine) Alvester Chou, NP as PCP - Internal Medicine (Nurse Practitioner) Clent Jacks, RN as Registered Nurse  CHIEF COMPLAINTS/PURPOSE OF CONSULTATION:    HEMATOLOGY HISTORY  # MACROCYTOSIS without anemia [since 2016] EGD colonoscopy [march 2019; Dr.Anna]-HIV hepatitis-negative; T73 folic acid normal.  2202 July CT scan-no hepatosplenomegaly.  Drinks wine; denies liquor  #CKD stage III-awaiting nephrology  #2019 June-right upper lobe/lower lobe 2 mm lung nodules-needs surveillance; active smoker   HISTORY OF PRESENTING ILLNESS:  [name] 59 y.o.  female is here for follow-up of her macrocytosis without anemia.  Patient states that she has been having abdominal pain for the last 1 to 2 weeks.  Rates it on a scale of 10.  Has no nausea no vomiting.  Complains of loose stools this evening.  No blood in stools.   Review of Systems  Constitutional: Positive for malaise/fatigue. Negative for chills, diaphoresis, fever and weight loss.  HENT: Negative for nosebleeds and sore throat.   Eyes: Negative for double vision.  Respiratory: Negative for cough, hemoptysis, sputum production, shortness of breath and wheezing.   Cardiovascular: Negative for chest pain, palpitations, orthopnea and leg swelling.  Gastrointestinal: Positive for abdominal pain and diarrhea. Negative for blood in stool, constipation, heartburn, melena, nausea and vomiting.  Genitourinary: Negative for dysuria, frequency and urgency.  Musculoskeletal: Positive for back pain and joint pain.  Skin: Negative.  Negative for itching and rash.  Neurological: Negative for dizziness, tingling, focal weakness, weakness and headaches.  Endo/Heme/Allergies: Does not bruise/bleed easily.  Psychiatric/Behavioral: Negative for depression. The patient is not nervous/anxious and does not have insomnia.      MEDICAL HISTORY:  Past Medical History:  Diagnosis Date  . Acute diverticulitis 07/16/2013  . Acute renal failure ([place]) 07/16/2013  . Arthritis    hands, back, knees  . Colitis 07/16/2013  . Diverticulitis   . Elevated CEA 08/15/2017  . GERD (gastroesophageal reflux disease)   . GIB (gastrointestinal bleeding) 01/07/2012  . Hypertension    somewhat controlled, taking medication  . Hypothyroidism   . Lesion of epiglottis   . Macrocytosis without anemia 08/15/2017  . Orthostasis 08/22/2017    SURGICAL HISTORY: Past Surgical History:  Procedure Laterality Date  . ABDOMINAL HYSTERECTOMY    . APPENDECTOMY    . CHOLECYSTECTOMY    . COLONOSCOPY  01/09/2012   Procedure: COLONOSCOPY;  Surgeon: Lear Ng, MD;  Location: [facility] ENDOSCOPY;  Service: Endoscopy;  Laterality: N/A;  . COLONOSCOPY WITH PROPOFOL N/A 08/25/2017   Procedure: COLONOSCOPY WITH PROPOFOL;  Surgeon: Jonathon Bellows, MD;  Location: [facility] ENDOSCOPY;  Service: Gastroenterology;  Laterality: N/A;  . ESOPHAGOGASTRODUODENOSCOPY  01/07/2012   Procedure: ESOPHAGOGASTRODUODENOSCOPY (EGD);  Surgeon: Winfield Cunas., MD;  Location: [facility] ENDOSCOPY;  Service: Endoscopy;  Laterality: N/A;  . ESOPHAGOGASTRODUODENOSCOPY (EGD) WITH PROPOFOL N/A 08/25/2017   Procedure: ESOPHAGOGASTRODUODENOSCOPY (EGD) WITH PROPOFOL;  Surgeon: Jonathon Bellows, MD;  Location: [facility] ENDOSCOPY;  Service: Gastroenterology;  Laterality: N/A;  . VIDEO BRONCHOSCOPY Bilateral 05/14/2016   Procedure: VIDEO BRONCHOSCOPY WITHOUT FLUORO;  Surgeon: Collene Gobble, MD;  Location: [facility];  Service: Cardiopulmonary;  Laterality: Bilateral;    SOCIAL HISTORY: Social History   Socioeconomic History  . Marital status: Single    Spouse name: Not on file  . Number of children: Not on file  . Years of education: Not on file  . Highest education level: Not on file  Occupational History  . Not on file  Tobacco Use  . Smoking status: Current Some Day Smoker    Packs/day:  2.00    Years: 42.00    Pack years: 84.00    Types: Cigarettes    Last attempt to quit: 07/07/2015    Years since quitting: 4.4  . Smokeless tobacco: Never Used  . Tobacco comment: Patient states she smoke 2 cigarettes a day  Vaping Use  . Vaping Use: Former  Substance and Sexual Activity  . Alcohol use: Yes    Alcohol/week: 35.0 standard drinks    Types: 7 Glasses of wine, 28 Shots of liquor per week    Comment: 01/10/13 - quit 6 to 7 months ago, prior heavy use  . Drug use: No  . Sexual activity: Never  Other Topics Concern  . Not on file  Social History Narrative   Works at [employer]; trying to quit 2/cig [~40 years]; glass of wine every other day.   Social Determinants of Health   Financial Resource Strain:   . Difficulty of Paying Living Expenses:   Food Insecurity:   . Worried About Charity fundraiser in the Last Year:   . Arboriculturist in the Last Year:   Transportation Needs:   . Film/video editor (Medical):   Marland Kitchen Lack of Transportation (Non-Medical):   Physical Activity:   . Days of Exercise per Week:   . Minutes of Exercise per Session:   Stress:   . Feeling of Stress :   Social Connections:   . Frequency of Communication with Friends and Family:   . Frequency of Social Gatherings with Friends and Family:   . Attends Religious Services:   . Active Member of Clubs or Organizations:   . Attends Archivist Meetings:   Marland Kitchen Marital Status:   Intimate Partner Violence:   . Fear of Current or Ex-Partner:   . Emotionally Abused:   Marland Kitchen Physically Abused:   . Sexually Abused:     FAMILY HISTORY: Family History  Problem Relation Age of Onset  . Breast cancer Mother        multiple types of cancer  . Stomach cancer Mother   . Throat cancer Mother   . Melanoma Mother   . Kidney disease Father     ALLERGIES:  has No Known Allergies.  MEDICATIONS:  Current Outpatient Medications  Medication Sig Dispense Refill  . albuterol (VENTOLIN HFA) 108 (90 Base)  MCG/ACT inhaler TAKE 2 PUFFS BY MOUTH EVERY 6 HOURS AS NEEDED FOR WHEEZE    . aspirin EC 81 MG tablet Take 81 mg by mouth daily.    . Biotin 5000 MCG TABS Take by mouth daily.    . Cholecalciferol (VITAMIN D-3) 5000 UNITS TABS Take 10,000 Units by mouth daily after breakfast.    . Cyanocobalamin (B-12) 5000 MCG CAPS Take by mouth daily.    . diclofenac Sodium (VOLTAREN) 1 % GEL APPLY TO THUMB TO 3 TIMES A DAY AS NEEDED FOR PAIN.    . fluticasone furoate-vilanterol (BREO ELLIPTA) 200-25 MCG/INH AEPB Inhale 1 puff into the lungs daily. 60 each 5  . levothyroxine (SYNTHROID, LEVOTHROID) 50 MCG tablet Take 50 mcg by mouth daily before breakfast.    . lisinopril (ZESTRIL) 10 MG tablet Take 10 mg by mouth daily.    . ondansetron (ZOFRAN-ODT) 8 MG disintegrating tablet SMARTSIG:1 Tablet(s) By Mouth Every 12 Hours PRN    . pantoprazole (PROTONIX) 20 MG tablet Take 20  mg by mouth daily.   4  . Probiotic Product (PROBIOTIC-10 PO) Take by mouth daily.    . rosuvastatin (CRESTOR) 5 MG tablet Take 1 tablet (5 mg total) by mouth every evening. For cholesterol. 90 tablet 3  . traZODone (DESYREL) 50 MG tablet TAKE 1 TABLET (50 MG TOTAL) BY MOUTH AT BEDTIME AS NEEDED FOR SLEEP. 90 tablet 1  . Zinc 25 MG TABS Take 25 mg by mouth.    Marland Kitchen HYDROcodone-acetaminophen (NORCO) 10-325 MG tablet Take 1 tablet by mouth 3 (three) times daily as needed. (Patient not taking: Reported on 01/02/2020)     No current facility-administered medications for this visit.      PHYSICAL EXAMINATION:   Vitals:   01/02/20 1427  BP: (!) 101/50  Pulse: 86  Resp: 18  Temp: 98 F (36.7 C)  SpO2: 98%   Filed Weights   01/02/20 1427  Weight: (!) 93 lb (42.2 kg)    Physical Exam HENT:     Head: Normocephalic and atraumatic.     Mouth/Throat:     Pharynx: No oropharyngeal exudate.  Eyes:     Pupils: Pupils are equal, round, and reactive to light.  Cardiovascular:     Rate and Rhythm: Normal rate and regular rhythm.   Pulmonary:     Effort: Pulmonary effort is normal. No respiratory distress.     Breath sounds: Normal breath sounds. No wheezing.  Abdominal:     General: Bowel sounds are normal. There is no distension.     Palpations: Abdomen is soft. There is no mass.     Tenderness: There is no abdominal tenderness. There is no guarding or rebound.     Comments: Positive for rebound tenderness.  Musculoskeletal:        General: No tenderness. Normal range of motion.     Cervical back: Normal range of motion and neck supple.  Skin:    General: Skin is warm.  Neurological:     Mental Status: She is alert and oriented to person, place, and time.  Psychiatric:        Mood and Affect: Affect normal.     LABORATORY DATA:  I have reviewed the data as listed Lab Results  Component Value Date   WBC 12.9 (H) 01/02/2020   HGB 12.6 01/02/2020   HCT 36.9 01/02/2020   MCV 106.3 (H) 01/02/2020   PLT 278 01/02/2020   Recent Labs    09/12/19 1530 10/03/19 1454 12/13/19 0756 01/02/20 1415  NA 139 136  --  138  K 5.1 4.5  --  3.9  CL 101 96*  --  106  CO2 28 29  --  28  GLUCOSE 91 89  --  103*  BUN 15 16  --  16  CREATININE 1.42* 1.29*  --  1.38*  CALCIUM 10.2 9.7  --  8.8*  GFRNONAA  --  46*  --  42*  GFRAA  --  53*  --  49*  PROT 6.9 7.6 7.4  --   ALBUMIN 4.5 4.6 4.8  --   AST 17 23 24   --   ALT 10 16 17   --   ALKPHOS 80 75 77  --   BILITOT 0.5 0.5 0.5  --   BILIDIR  --   --  0.1  --      No results found.  Macrocytosis # Macrocytosis- with out anemia [MCV 103; Hb 13]; hemoglobin today is 12.5.  Stable.   #Acute abdominal pain-question  peritoneal signs.-unclear etiology.  Offered going to the emergency room patient declines.  Recommend checking LFTs lipase amylase.  Also recommend getting a stat CT of the abdomen pelvis; oral contrast no IV contrast.  #Chronic insufficiency-creatinine 1.4 GFR 43.  Recommend increase fluid intake hold IV dye.  # 2019- lung nodule-right upper lobe  and right lower lobe 2 mm lung nodules-would recommend surveillance imaging; order at next visit.  #Borderline slightly low blood pressure; systolic 412; recommend holding lisinopril 10 mg at this time.  # DISPOSITION: will call with results/lisinopril..  # CT Ab/pelvis STAT.  # Follow up to be decided- Dr.B   All questions were answered. The patient knows to call the clinic with any problems, questions or concerns.    Cammie Sickle, MD 01/02/2020 3:19 PM

## 2020-01-04 ENCOUNTER — Ambulatory Visit
Admission: RE | Admit: 2020-01-04 | Discharge: 2020-01-04 | Disposition: A | Discharge status: Home / Self Care | Payer: 59 | Source: Ambulatory Visit | Attending: Internal Medicine | Admitting: Internal Medicine

## 2020-01-04 ENCOUNTER — Telehealth: Payer: Self-pay | Admitting: Internal Medicine

## 2020-01-04 ENCOUNTER — Other Ambulatory Visit: Payer: Self-pay

## 2020-01-04 DIAGNOSIS — R1084 Generalized abdominal pain: Secondary | ICD-10-CM | POA: Diagnosis present

## 2020-01-04 DIAGNOSIS — D72829 Elevated white blood cell count, unspecified: Secondary | ICD-10-CM | POA: Diagnosis not present

## 2020-01-04 NOTE — Telephone Encounter (Signed)
On 7/30 -spoke to patient regarding results of the CT scan-negative for any acute process.  Unable to explain the patient's abdominal pain.  Recommend evaluation with GI.  Refer to [facility] [place] [facility] gastro[facility]-regarding abdominal pain

## 2020-01-07 NOTE — Addendum Note (Signed)
Addended by: Delice Bison E on: 01/07/2020 08:19 AM   Modules accepted: Orders

## 2020-01-07 NOTE — Telephone Encounter (Signed)
Referral placed to [facility].

## 2020-01-08 ENCOUNTER — Ambulatory Visit
Admission: EM | Admit: 2020-01-08 | Discharge: 2020-01-08 | Disposition: A | Discharge status: Home / Self Care | Payer: 59 | Attending: Emergency Medicine | Admitting: Emergency Medicine

## 2020-01-08 DIAGNOSIS — M545 Low back pain, unspecified: Secondary | ICD-10-CM

## 2020-01-08 DIAGNOSIS — J029 Acute pharyngitis, unspecified: Secondary | ICD-10-CM | POA: Diagnosis not present

## 2020-01-08 DIAGNOSIS — G8929 Other chronic pain: Secondary | ICD-10-CM | POA: Insufficient documentation

## 2020-01-08 LAB — POCT RAPID STREP A (OFFICE): Rapid Strep A Screen: NEGATIVE

## 2020-01-08 NOTE — ED Provider Notes (Signed)
[name]    CSN: [id number] Arrival date & time: 01/08/20  1340      History   Chief Complaint Chief Complaint  Patient presents with  . Sore Throat  . Back Pain    HPI [name] is a 59 y.o. female.   Patient presents with 2-day history of sore throat.  She also reports rhinorrhea and tender swollen lymph nodes.  Treatment attempted at home with Flonase.  She denies fever, chills, rash, cough, shortness of breath, vomiting, diarrhea, or other symptoms.  Patient also reports she fell at work today.  She states she "tripped over a skid."  She reports lower back pain.  She denies bruising, redness, lesions, numbness, tingling, paresthesias, or other symptoms.  No treatments attempted.  Patient has a history of chronic pain.  The history is provided by the patient.    Past Medical History:  Diagnosis Date  . Acute diverticulitis 07/16/2013  . Acute renal failure ([place]) 07/16/2013  . Arthritis    hands, back, knees  . Colitis 07/16/2013  . Diverticulitis   . Elevated CEA 08/15/2017  . GERD (gastroesophageal reflux disease)   . GIB (gastrointestinal bleeding) 01/07/2012  . Hypertension    somewhat controlled, taking medication  . Hypothyroidism   . Lesion of epiglottis   . Macrocytosis without anemia 08/15/2017  . Orthostasis 08/22/2017    Patient Active Problem List   Diagnosis Date Noted  . Lumbar facet syndrome (Bilateral) (R>L) 12/12/2019  . Chronic sacroiliac joint pain (Left) 12/12/2019  . Chronic pain syndrome 12/11/2019  . Pharmacologic therapy 12/11/2019  . Disorder of skeletal system 12/11/2019  . Problems influencing health status 12/11/2019  . Benign hypertensive kidney disease with chronic kidney disease 11/07/2019  . Insomnia 09/12/2019  . Cramps of lower extremity 09/12/2019  . Aortic atherosclerosis ([place]) 09/12/2019  . GERD (gastroesophageal reflux disease) 09/12/2019  . Acute respiratory failure with hypoxia ([place]) 06/05/2019  . COPD with acute  exacerbation ([place]) 06/05/2019  . Acquired hypothyroidism 06/05/2019  . Chronic bilateral low back pain without sciatica 08/16/2018  . Stage 3 chronic kidney disease 08/16/2018  . Insomnia due to other mental disorder 08/16/2018  . Lesion of epiglottis 08/10/2018  . Chronic abdominal pain 01/03/2018  . Orthostasis 08/22/2017  . Hypercalcemia 08/15/2017  . Macrocytosis 08/15/2017  . Lung nodules 05/07/2016  . COPD (chronic obstructive pulmonary disease) ([place]) 05/07/2016  . Chronic obstructive pulmonary disease ([place]) 05/07/2016  . HTN (hypertension) 07/16/2013  . Protein-calorie malnutrition, severe ([place]) 01/12/2013  . Alcohol abuse 01/07/2012  . Hypothyroidism 03/03/2010    Past Surgical History:  Procedure Laterality Date  . ABDOMINAL HYSTERECTOMY    . APPENDECTOMY    . CHOLECYSTECTOMY    . COLONOSCOPY  01/09/2012   Procedure: COLONOSCOPY;  Surgeon: Lear Ng, MD;  Location: [facility] ENDOSCOPY;  Service: Endoscopy;  Laterality: N/A;  . COLONOSCOPY WITH PROPOFOL N/A 08/25/2017   Procedure: COLONOSCOPY WITH PROPOFOL;  Surgeon: Jonathon Bellows, MD;  Location: [facility] ENDOSCOPY;  Service: Gastroenterology;  Laterality: N/A;  . ESOPHAGOGASTRODUODENOSCOPY  01/07/2012   Procedure: ESOPHAGOGASTRODUODENOSCOPY (EGD);  Surgeon: Winfield Cunas., MD;  Location: [facility] ENDOSCOPY;  Service: Endoscopy;  Laterality: N/A;  . ESOPHAGOGASTRODUODENOSCOPY (EGD) WITH PROPOFOL N/A 08/25/2017   Procedure: ESOPHAGOGASTRODUODENOSCOPY (EGD) WITH PROPOFOL;  Surgeon: Jonathon Bellows, MD;  Location: [facility] ENDOSCOPY;  Service: Gastroenterology;  Laterality: N/A;  . VIDEO BRONCHOSCOPY Bilateral 05/14/2016   Procedure: VIDEO BRONCHOSCOPY WITHOUT FLUORO;  Surgeon: Collene Gobble, MD;  Location: [facility];  Service:  Cardiopulmonary;  Laterality: Bilateral;    OB History    Gravida  1   Para      Term      Preterm      AB      Living  1     SAB      TAB      Ectopic      Multiple      Live Births                 Home Medications    Prior to Admission medications   Medication Sig Start Date End Date Taking? Authorizing Provider  albuterol (VENTOLIN HFA) 108 (90 Base) MCG/ACT inhaler TAKE 2 PUFFS BY MOUTH EVERY 6 HOURS AS NEEDED FOR WHEEZE 06/26/19   [provider]  aspirin EC 81 MG tablet Take 81 mg by mouth daily.    [provider]  Biotin 5000 MCG TABS Take by mouth daily.    [provider]  Cholecalciferol (VITAMIN D-3) 5000 UNITS TABS Take 10,000 Units by mouth daily after breakfast.    [provider]  Cyanocobalamin (B-12) 5000 MCG CAPS Take by mouth daily.    [provider]  diclofenac Sodium (VOLTAREN) 1 % GEL APPLY TO THUMB TO 3 TIMES A DAY AS NEEDED FOR PAIN. 09/24/19   [provider]  fluticasone furoate-vilanterol (BREO ELLIPTA) 200-25 MCG/INH AEPB Inhale 1 puff into the lungs daily. 09/12/19   Pleas Koch, NP  HYDROcodone-acetaminophen (NORCO) 10-325 MG tablet Take 1 tablet by mouth 3 (three) times daily as needed. Patient not taking: Reported on 01/02/2020 09/21/19   [provider]  levothyroxine (SYNTHROID, LEVOTHROID) 50 MCG tablet Take 50 mcg by mouth daily before breakfast.    [provider]  lisinopril (ZESTRIL) 10 MG tablet Take 10 mg by mouth daily. 07/15/19   [provider]  ondansetron (ZOFRAN-ODT) 8 MG disintegrating tablet SMARTSIG:1 Tablet(s) By Mouth Every 12 Hours PRN 09/29/19   [provider]  pantoprazole (PROTONIX) 20 MG tablet Take 20 mg by mouth daily.  02/16/16   [provider]  Probiotic Product (PROBIOTIC-10 PO) Take by mouth daily.    [provider]  rosuvastatin (CRESTOR) 5 MG tablet Take 1 tablet (5 mg total) by mouth every evening. For cholesterol. 09/17/19   Pleas Koch, NP  traZODone (DESYREL) 50 MG tablet TAKE 1 TABLET (50 MG TOTAL) BY MOUTH AT BEDTIME AS NEEDED FOR SLEEP. 11/28/19   Pleas Koch, NP  Zinc 25 MG TABS Take 25 mg  by mouth.    [provider]    Family History Family History  Problem Relation Age of Onset  . Breast cancer Mother        multiple types of cancer  . Stomach cancer Mother   . Throat cancer Mother   . Melanoma Mother   . Kidney disease Father     Social History Social History   Tobacco Use  . Smoking status: Current Some Day Smoker    Packs/day: 2.00    Years: 42.00    Pack years: 84.00    Types: Cigarettes    Last attempt to quit: 07/07/2015    Years since quitting: 4.5  . Smokeless tobacco: Never Used  . Tobacco comment: Patient states she smoke 2 cigarettes a day  Vaping Use  . Vaping Use: Former  Substance Use Topics  . Alcohol use: Yes    Alcohol/week: 35.0 standard drinks    Types: 7  Glasses of wine, 28 Shots of liquor per week    Comment: 01/10/13 - quit 6 to 7 months ago, prior heavy use  . Drug use: No     Allergies   Patient has no known allergies.   Review of Systems Review of Systems  Constitutional: Negative for chills and fever.  HENT: Positive for rhinorrhea and sore throat. Negative for ear pain.   Eyes: Negative for pain and visual disturbance.  Respiratory: Negative for cough and shortness of breath.   Cardiovascular: Negative for chest pain and palpitations.  Gastrointestinal: Negative for abdominal pain and vomiting.  Genitourinary: Negative for dysuria and hematuria.  Musculoskeletal: Positive for back pain. Negative for arthralgias.  Skin: Negative for color change and rash.  Neurological: Negative for seizures and syncope.  All other systems reviewed and are negative.    Physical Exam Triage Vital Signs ED Triage Vitals  Enc Vitals Group     BP      Pulse      Resp      Temp      Temp src      SpO2      Weight      Height      Head Circumference      Peak Flow      Pain Score      Pain Loc      Pain Edu?      Excl. in [place]?    No data found.  Updated Vital Signs BP (!) 152/82 (BP Location: Right Arm)   Pulse  96   Temp 98 F (36.7 C) (Oral)   Resp 16   SpO2 96%   Visual Acuity Right Eye Distance:   Left Eye Distance:   Bilateral Distance:    Right Eye Near:   Left Eye Near:    Bilateral Near:     Physical Exam Vitals and nursing note reviewed.  Constitutional:      General: She is not in acute distress.    Appearance: She is well-developed. She is not ill-appearing.  HENT:     Head: Normocephalic and atraumatic.     Right Ear: Tympanic membrane normal.     Left Ear: Tympanic membrane normal.     Nose: Nose normal.     Mouth/Throat:     Mouth: Mucous membranes are moist.     Pharynx: Oropharynx is clear.  Eyes:     Conjunctiva/sclera: Conjunctivae normal.  Cardiovascular:     Rate and Rhythm: Normal rate and regular rhythm.     Heart sounds: Normal heart sounds. No murmur heard.   Pulmonary:     Effort: Pulmonary effort is normal. No respiratory distress.     Breath sounds: Normal breath sounds. No wheezing or rhonchi.  Abdominal:     Palpations: Abdomen is soft.     Tenderness: There is no abdominal tenderness. There is no right CVA tenderness, left CVA tenderness, guarding or rebound.  Musculoskeletal:        General: No swelling, tenderness, deformity or signs of injury. Normal range of motion.     Cervical back: Neck supple.  Skin:    General: Skin is warm and dry.     Findings: No bruising, erythema, lesion or rash.  Neurological:     General: No focal deficit present.     Mental Status: She is alert and oriented to person, place, and time.     Sensory: No sensory deficit.     Motor: No weakness.  Gait: Gait normal.  Psychiatric:        Mood and Affect: Mood normal.        Behavior: Behavior normal.      UC Treatments / Results  Labs (all labs ordered are listed, but only abnormal results are displayed) Labs Reviewed  NOVEL CORONAVIRUS, NAA  POCT RAPID STREP A (OFFICE)    EKG   Radiology No results found.  Procedures Procedures (including  critical care time)  Medications Ordered in UC Medications - No data to display  Initial Impression / Assessment and Plan / UC Course  I have reviewed the triage vital signs and the nursing notes.  Pertinent labs & imaging results that were available during my care of the patient were reviewed by me and considered in my medical decision making (see chart for details).   Sore throat.  Chronic low back pain without sciatica.  Rapid strep negative; throat culture pending.  PCR COVID pending.  Instructed patient to self quarantine until the test result is back.  Discussed Tylenol as needed for discomfort.  Instructed her to follow-up with her PCP if her symptoms or not improving.  Patient agrees to plan of care.     Final Clinical Impressions(s) / UC Diagnoses   Final diagnoses:  Sore throat  Chronic low back pain without sciatica, unspecified back pain laterality     Discharge Instructions     Your rapid strep test is negative.  A throat culture is pending; we will call you if it is positive requiring treatment.    Your COVID test is pending.  You should self quarantine until the test result is back.    Follow up with your primary care provider if your symptoms are not improving.          ED Prescriptions    None     I have reviewed the PDMP during this encounter.   Sharion Balloon, NP 01/08/20 1408

## 2020-01-08 NOTE — Discharge Instructions (Signed)
Your rapid strep test is negative.  A throat culture is pending; we will call you if it is positive requiring treatment.    Your COVID test is pending.  You should self quarantine until the test result is back.    Follow up with your primary care provider if your symptoms are not improving.

## 2020-01-08 NOTE — ED Triage Notes (Signed)
Pt presents to UC for sore throat x2 days, runny nose, states "glands are swollen", and painful swallowing. Pt states she called PCP and instructed to take flonase, no relief.   Pt also fell at work today from standing, c/o back pain. Pt states she landed on back when she fell. Pt denies numbness or tingling in extremities. Pt denies OTC treatments or relieving factors.

## 2020-01-08 NOTE — ED Notes (Signed)
Pt presents to UC for sore throat x2 days, runny nose, states "glands are swollen", and painful swallowing. Pt states she called PCP and instructed to take flonase, no relief.   Pt also fell at work today from standing, c/o back pain. Pt states she landed on back when she fell. Pt denies numbness or tingling in extremities. Pt denies OTC treatments or relieving factors.

## 2020-01-11 LAB — SARS-COV-2, NAA 2 DAY TAT

## 2020-01-11 LAB — NOVEL CORONAVIRUS, NAA: SARS-CoV-2, NAA: NOT DETECTED

## 2020-01-11 LAB — CULTURE, GROUP A STREP (THRC)

## 2020-01-12 NOTE — Progress Notes (Deleted)
NO SHOW.  Pre-charting review: According to the patient she came to our practice to get some medication for her pain.  She described her primary pain has been that of the lower back, bilaterally, (R>L).  She denies any prior back surgeries, recent x-rays, but does admit to having had some physical therapy approximately 2 years ago at [facility].  She denies any prior nerve blocks or joint injections.  She specifically denies any lower extremity pain, hip pain, or knee pain.  She indicates previously being managed at the [facility] at [address], [place], [place] [address], telephone number [phone].  Provocative physical exam was positive for bilateral lumbar facet pain upon hyperextension and rotation of the lumbar spine.  Simple hyperextension of the spine yielded pain in the midline of the lower back.  Provocative Patrick maneuver was positive only on the left side for sacroiliac joint pain.  The patient was able to toe walk and heel walk without any problems.  Straight leg raise was negative bilaterally with adequate range of motion of both lower extremities.  Range of motion of all major joints of the lower extremity was within normal limits.  DTRs were +2 and equal bilaterally for the patella tendon and the Achilles tendon.  No evidence of any lumbar radiculopathy affecting the lower lumbar region (L4-S1).  Today's review of the UDS shows abnormal findings.  The UDS came back positive for alprazolam (Xanax), which according to her PMP has not been prescribed to her since 08/15/2018.  This particular prescription was filled on 09/29/2018.  In addition, the UDS came back positive for unreported amphetamines and oxycodone, neither which, according to her PMP, has been prescribed to her since before 01/13/2018.  In fact, I went back to the PMP and conducted a multistate search going back to 2015, which revealed that she has never had any oxycodone, oxymorphone, or  amphetamines prescribed to her.  This means that the source of those is illegal.  Controlled Substance Pharmacotherapy Assessment REMS (Risk Evaluation and Mitigation Strategy)  Analgesic: No opioid analgesics prescribed by our practice. NO OPIOIDS. HIGH RISK.  Abnormal 12/12/2019 UDS positive for amphetamines and oxycodone not registered in the patient's multistate PMP on a search dating back to 2015.  Hydrocodone/APAP 10/325 1 tablet p.o. every 8 hours (30 mg/day of hydrocodone) (30 MME/day) Highest recorded MME/day: 45 mg/day MME/day: 30 mg/day   Summary  Date Value Ref Range Status  12/12/2019 Note  Final    Comment:    ==================================================================== Compliance Drug Analysis, Ur ==================================================================== Test                             Result       Flag       Units  Drug Present and Declared for Prescription Verification   Trazodone                      PRESENT      EXPECTED   1,3 chlorophenyl piperazine    PRESENT      EXPECTED    1,3-chlorophenyl piperazine is an expected metabolite of trazodone.    Acetaminophen                  PRESENT      EXPECTED   Salicylate                     PRESENT  EXPECTED  Drug Present not Declared for Prescription Verification   Amphetamine                    3532         UNEXPECTED ng/mg creat    Amphetamine is available as a schedule II prescription drug.    Alprazolam                     79           UNEXPECTED ng/mg creat   Alpha-hydroxyalprazolam        93           UNEXPECTED ng/mg creat    Source of alprazolam is a scheduled prescription medication. Alpha-    hydroxyalprazolam is an expected metabolite of alprazolam.    Oxymorphone                    46           UNEXPECTED ng/mg creat   Noroxycodone                   307          UNEXPECTED ng/mg creat   Noroxymorphone                 125          UNEXPECTED ng/mg creat    Oxymorphone, noroxycodone and  noroxymorphone are expected    metabolites of oxycodone. Noroxymorphone is an expected metabolite    of oxymorphone. Sources of oxycodone and/or oxymorphone include    scheduled prescription medications.    Diphenhydramine                PRESENT      UNEXPECTED  Drug Absent but Declared for Prescription Verification   Hydrocodone                    Not Detected UNEXPECTED ng/mg creat   Diclofenac                     Not Detected UNEXPECTED    Topical diclofenac, as indicated in the declared medication list, is    not always detected even when used as directed.  ==================================================================== Test                      Result    Flag   Units      Ref Range   Creatinine              164              mg/dL      >=20 ==================================================================== Declared Medications:  The flagging and interpretation on this report are based on the  following declared medications.  Unexpected results may arise from  inaccuracies in the declared medications.   **Note: The testing scope of this panel includes these medications:   Hydrocodone (Norco)  Trazodone (Desyrel)   **Note: The testing scope of this panel does not include small to  moderate amounts of these reported medications:   Acetaminophen (Norco)  Aspirin  Topical Diclofenac   **Note: The testing scope of this panel does not include the  following reported medications:   Albuterol (Ventolin HFA)  Biotin  Fluticasone (Breo)  Levothyroxine (Synthroid)  Lisinopril (Zestril)  Ondansetron (Zofran)  Pantoprazole (Protonix)  Probiotic  Rosuvastatin (Crestor)  Vilanterol (Breo)  Vitamin B12  Vitamin D3 ====================================================================  For clinical consultation, please call [phone]. ====================================================================    UDS interpretation: Unexpected findings: Undeclared controlled  substance detected.  Multistate PMP search going back to 2015 reveals that the patient has had neither the amphetamines nor the oxycodone/oxymorphone prescribed to her.  Source appears to be illegal.

## 2020-01-13 DIAGNOSIS — F1590 Other stimulant use, unspecified, uncomplicated: Secondary | ICD-10-CM | POA: Insufficient documentation

## 2020-01-13 DIAGNOSIS — F1990 Other psychoactive substance use, unspecified, uncomplicated: Secondary | ICD-10-CM | POA: Insufficient documentation

## 2020-01-13 DIAGNOSIS — R892 Abnormal level of other drugs, medicaments and biological substances in specimens from other organs, systems and tissues: Secondary | ICD-10-CM | POA: Insufficient documentation

## 2020-01-13 DIAGNOSIS — F1190 Opioid use, unspecified, uncomplicated: Secondary | ICD-10-CM | POA: Insufficient documentation

## 2020-01-13 DIAGNOSIS — Z79899 Other long term (current) drug therapy: Secondary | ICD-10-CM | POA: Insufficient documentation

## 2020-01-14 ENCOUNTER — Ambulatory Visit: Payer: 59 | Admitting: Pain Medicine

## 2020-02-22 ENCOUNTER — Other Ambulatory Visit: Payer: Self-pay | Admitting: Primary Care

## 2020-02-22 DIAGNOSIS — J439 Emphysema, unspecified: Secondary | ICD-10-CM

## 2020-03-10 ENCOUNTER — Other Ambulatory Visit: Payer: Self-pay

## 2020-03-13 ENCOUNTER — Ambulatory Visit (INDEPENDENT_AMBULATORY_CARE_PROVIDER_SITE_OTHER): Payer: 59 | Admitting: Gastroenterology

## 2020-03-13 ENCOUNTER — Other Ambulatory Visit: Payer: Self-pay

## 2020-03-13 ENCOUNTER — Encounter: Payer: Self-pay | Admitting: Gastroenterology

## 2020-03-13 VITALS — BP 104/65 | HR 99 | Temp 98.5°F | Ht <= 58 in | Wt 89.0 lb

## 2020-03-13 DIAGNOSIS — R1030 Lower abdominal pain, unspecified: Secondary | ICD-10-CM | POA: Diagnosis not present

## 2020-03-13 DIAGNOSIS — K5909 Other constipation: Secondary | ICD-10-CM

## 2020-03-13 DIAGNOSIS — R140 Abdominal distension (gaseous): Secondary | ICD-10-CM

## 2020-03-13 DIAGNOSIS — R1013 Epigastric pain: Secondary | ICD-10-CM

## 2020-03-13 DIAGNOSIS — K641 Second degree hemorrhoids: Secondary | ICD-10-CM

## 2020-03-13 DIAGNOSIS — G8929 Other chronic pain: Secondary | ICD-10-CM

## 2020-03-13 MED ORDER — POLYETHYLENE GLYCOL 3350 17 GM/SCOOP PO POWD: 1.0000 | Freq: Once | ORAL | 0 refills | Status: AC

## 2020-03-13 MED ORDER — HYDROCORTISONE (PERIANAL) 2.5 % EX CREA: 1.0000 "application " | TOPICAL_CREAM | Freq: Two times a day (BID) | CUTANEOUS | 0 refills | Status: DC

## 2020-03-13 NOTE — Progress Notes (Signed)
Cephas Darby, MD [address]  [place]  [place], [place] [address]  Main: [phone]  Fax: [phone]    Gastroenterology Consultation  Referring Provider:     Pleas Koch, NP Primary Care Physician:  Pleas Koch, NP Primary Gastroenterologist:  Dr. Vicente Males Reason for Consultation:     Lower abdominal pain, chronic constipation        HPI:   [name] is a 59 y.o. female referred by Dr. Carlis Abbott, Leticia Penna, NP  for consultation & management of lower abdominal pain, chronic constipation.  Patient has history of heavy tobacco use, prior history of NSAID use has been experiencing several years history of generalized lower abdominal pain associated with bloating and difficulty with bowel movements.  Patient was previously seen and evaluated by Dr. Vicente Males.  Underwent bidirectional endoscopy which revealed mild left-sided active colitis which was thought to be secondary to NSAID injury.  She underwent cross-sectional imaging which was also unremarkable.  Patient reports that her bowel movements are hard, describes on Bristol scale 1 to 3, she drinks a small bottle of water at night.  She does admit to drinking [facility] 4 cans daily for long time as well as sweetened tea.  She has tried over-the-counter stool softeners with no relief.  She does smoke cigarettes, currently on nicotine patch and smokes 1 cigarette/day.  She admits to eating fiber from home grown vegetables.  She also reports epigastric discomfort.  Denies nausea, vomiting.  She is on pantoprazole 20 mg daily which does not help, she has been on it for several years.  Her EGD was unremarkable including biopsies.  Patient is also evaluated by heme-onc Dr. Rogue Bussing in the past.  Patient is also concerned about flareup of hemorrhoid symptoms including prolapse, rectal pain and discomfort as well as bleeding.  She reports undergoing hemorrhoid ligation several years ago.  She has not tried any  over-the-counter medications yet. Her weight has been stable, she denies loss of appetite.  NSAIDs: None  Antiplts/Anticoagulants/Anti thrombotics: None  GI Procedures: EGD and colonoscopy 08/25/2017 - Normal examined duodenum. - Normal esophagus. - Normal stomach. Biopsied.  - Diverticulosis in the entire examined colon. - Localized mild inflammation was found at the hepatic flexure secondary to colitis. Biopsied. - The examination was otherwise normal on direct and retroflexion views.  DIAGNOSIS:  A. RANDOM STOMACH; COLD BIOPSY:  - ANTRAL MUCOSA WITH FEATURES OF A HEALING MUCOSAL INJURY.  - OXYNTIC MUCOSA WITH PROTON PUMP INHIBITOR EFFECT.  - NEGATIVE FOR H. PYLORI, DYSPLASIA AND MALIGNANCY.   B. COLON, HEPATIC FLEXURE; COLD BIOPSY:  - MILD ACTIVE INFLAMMATION, SUPERFICIAL CRYPT DROP OUT AND  FIBROSIS/HYALINIZATION OF THE LAMINA PROPRIA, SEE NOTE.  - NEGATIVE FOR DYSPLASIA AND MALIGNANCY.  - DEEPER LEVELS WERE EXAMINED.   Past Medical History:  Diagnosis Date  . Acute diverticulitis 07/16/2013  . Acute renal failure ([place]) 07/16/2013  . Arthritis    hands, back, knees  . Colitis 07/16/2013  . Diverticulitis   . Elevated CEA 08/15/2017  . GERD (gastroesophageal reflux disease)   . GIB (gastrointestinal bleeding) 01/07/2012  . Hypertension    somewhat controlled, taking medication  . Hypothyroidism   . Lesion of epiglottis   . Macrocytosis without anemia 08/15/2017  . Orthostasis 08/22/2017    Past Surgical History:  Procedure Laterality Date  . ABDOMINAL HYSTERECTOMY    . APPENDECTOMY    . CHOLECYSTECTOMY    . COLONOSCOPY  01/09/2012   Procedure: COLONOSCOPY;  Surgeon:  Lear Ng, MD;  Location[facility] ENDOSCOPY;  Service: Endoscopy;  Laterality: N/A;  . COLONOSCOPY WITH PROPOFOL N/A 08/25/2017   Procedure: COLONOSCOPY WITH PROPOFOL;  Surgeon: Jonathon Bellows, MD;  Location: [facility] ENDOSCOPY;  Service: Gastroenterology;  Laterality: N/A;  . ESOPHAGOGASTRODUODENOSCOPY   01/07/2012   Procedure: ESOPHAGOGASTRODUODENOSCOPY (EGD);  Surgeon: Winfield Cunas., MD;  Location: [facility] ENDOSCOPY;  Service: Endoscopy;  Laterality: N/A;  . ESOPHAGOGASTRODUODENOSCOPY (EGD) WITH PROPOFOL N/A 08/25/2017   Procedure: ESOPHAGOGASTRODUODENOSCOPY (EGD) WITH PROPOFOL;  Surgeon: Jonathon Bellows, MD;  Location: [facility] ENDOSCOPY;  Service: Gastroenterology;  Laterality: N/A;  . VIDEO BRONCHOSCOPY Bilateral 05/14/2016   Procedure: VIDEO BRONCHOSCOPY WITHOUT FLUORO;  Surgeon: Collene Gobble, MD;  Location: [facility];  Service: Cardiopulmonary;  Laterality: Bilateral;    Current Outpatient Medications:  .  albuterol (VENTOLIN HFA) 108 (90 Base) MCG/ACT inhaler, TAKE 2 PUFFS BY MOUTH EVERY 6 HOURS AS NEEDED FOR WHEEZE, Disp: , Rfl:  .  aspirin EC 81 MG tablet, Take 81 mg by mouth daily., Disp: , Rfl:  .  Biotin 5000 MCG TABS, Take by mouth daily., Disp: , Rfl:  .  BREO ELLIPTA 200-25 MCG/INH AEPB, TAKE 1 PUFF BY MOUTH EVERY DAY, Disp: 60 each, Rfl: 5 .  Cholecalciferol (VITAMIN D-3) 5000 UNITS TABS, Take 10,000 Units by mouth daily after breakfast., Disp: , Rfl:  .  Cyanocobalamin (B-12) 5000 MCG CAPS, Take by mouth daily., Disp: , Rfl:  .  diclofenac Sodium (VOLTAREN) 1 % GEL, APPLY TO THUMB TO 3 TIMES A DAY AS NEEDED FOR PAIN., Disp: , Rfl:  .  gabapentin (NEURONTIN) 300 MG capsule, Take 300-600 mg by mouth at bedtime., Disp: , Rfl:  .  levothyroxine (SYNTHROID, LEVOTHROID) 50 MCG tablet, Take 50 mcg by mouth daily before breakfast., Disp: , Rfl:  .  lisinopril (ZESTRIL) 10 MG tablet, Take 10 mg by mouth daily., Disp: , Rfl:  .  meloxicam (MOBIC) 7.5 MG tablet, Take 7.5 mg by mouth 2 (two) times daily., Disp: , Rfl:  .  Multiple Vitamin (MULTIVITAMIN) capsule, Take by mouth., Disp: , Rfl:  .  ondansetron (ZOFRAN-ODT) 8 MG disintegrating tablet, SMARTSIG:1 Tablet(s) By Mouth Every 12 Hours PRN, Disp: , Rfl:  .  pantoprazole (PROTONIX) 20 MG tablet, Take 20 mg by mouth daily. , Disp: , Rfl:  4 .  Probiotic Product (PROBIOTIC-10 PO), Take by mouth daily., Disp: , Rfl:  .  rosuvastatin (CRESTOR) 5 MG tablet, Take 1 tablet (5 mg total) by mouth every evening. For cholesterol., Disp: 90 tablet, Rfl: 3 .  traZODone (DESYREL) 50 MG tablet, TAKE 1 TABLET (50 MG TOTAL) BY MOUTH AT BEDTIME AS NEEDED FOR SLEEP., Disp: 90 tablet, Rfl: 1 .  Zinc 25 MG TABS, Take 25 mg by mouth., Disp: , Rfl:  .  hydrocortisone (ANUSOL-HC) 2.5 % rectal cream, Place 1 application rectally 2 (two) times daily., Disp: 30 g, Rfl: 0 .  polyethylene glycol powder (GLYCOLAX/MIRALAX) 17 GM/SCOOP powder, Take 255 g by mouth once for 1 dose., Disp: 255 g, Rfl: 0   Family History  Problem Relation Age of Onset  . Breast cancer Mother        multiple types of cancer  . Stomach cancer Mother   . Throat cancer Mother   . Melanoma Mother   . Kidney disease Father      Social History   Tobacco Use  . Smoking status: Current Some Day Smoker    Packs/day: 2.00    Years: 42.00  Pack years: 84.00    Types: Cigarettes    Last attempt to quit: 07/07/2015    Years since quitting: 4.6  . Smokeless tobacco: Never Used  . Tobacco comment: Patient states she smoke 2 cigarettes a day  Vaping Use  . Vaping Use: Former  Substance Use Topics  . Alcohol use: Not Currently    Alcohol/week: 35.0 standard drinks    Types: 7 Glasses of wine, 28 Shots of liquor per week    Comment: 01/10/13 - quit 6 to 7 months ago, prior heavy use  . Drug use: No    Allergies as of 03/13/2020  . (No Known Allergies)    Review of Systems:    All systems reviewed and negative except where noted in HPI.   Physical Exam:  BP 104/65 (BP Location: Left Arm, Patient Position: Sitting, Cuff Size: Normal)   Pulse 99   Temp 98.5 F (36.9 C) (Oral)   Ht 4\' 10"  (1.473 m)   Wt 89 lb (40.4 kg)   BMI 18.60 kg/m  No LMP recorded. Patient has had a hysterectomy.  General:   Alert, thin build, moderately nourished, pleasant and cooperative in  NAD Head:  Normocephalic and atraumatic. Eyes:  Sclera clear, no icterus.   Conjunctiva pink. Ears:  Normal auditory acuity. Nose:  No deformity, discharge, or lesions. Mouth:  No deformity or lesions,oropharynx pink & moist. Neck:  Supple; no masses or thyromegaly. Lungs:  Respirations even and unlabored.  Clear throughout to auscultation.   No wheezes, crackles, or rhonchi. No acute distress. Heart:  Regular rate and rhythm; no murmurs, clicks, rubs, or gallops. Abdomen:  Normal bowel sounds. Soft, diffuse tenderness, worse in lower abdomen, distended, tympanic to percussion without masses, hepatosplenomegaly or hernias noted.  No guarding or rebound tenderness.   Rectal: Not performed Msk:  Symmetrical without gross deformities. Good, equal movement & strength bilaterally. Pulses:  Normal pulses noted. Extremities:  No clubbing or edema.  No cyanosis. Neurologic:  Alert and oriented x3;  grossly normal neurologically. Skin:  Intact without significant lesions or rashes. No jaundice. Lymph Nodes:  No significant cervical adenopathy. Psych:  Alert and cooperative. Normal mood and affect.  Imaging Studies: Reviewed  Assessment and Plan:   [name] is a 59 y.o. female with history of heavy tobacco use, prior history of NSAID use, hypertension, hypothyroidism is seen in consultation for chronic lower abdominal pain associated with chronic constipation and grade 2 symptomatic hemorrhoids  Chronic lower abdominal pain and chronic constipation I have discussed in length the importance of high-fiber diet, adequate intake of water Strongly reiterated to stop drinking carbonated beverages Advised to try a bottle of magnesium citrate or MiraLAX bowel prep for cleanout Trial of Linzess 145 MCG, samples provided Strongly advised to quit smoking  Grade 2 symptomatic hemorrhoids Manage constipation as above Anusol cream twice daily for 2 weeks  Chronic epigastric discomfort EGD is  unremarkable Patient stopped NSAID use Okay to discontinue pantoprazole at this time Discussed with her about possibility of chronic pancreatitis secondary to long-term tobacco use, that can lead to chronic epigastric pain Most recent CT revealed normal pancreas Highly recommend abstinence from smoking   Follow up in 2 months   Cephas Darby, MD

## 2020-03-13 NOTE — Patient Instructions (Addendum)
1. Please get a bottle of magnesium citrate.  2. Prescription sent to the pharmacy for  Anusol cream and Miralax.  3. Gave samples of Linzess 167mcg

## 2020-04-15 ENCOUNTER — Other Ambulatory Visit: Payer: Self-pay | Admitting: Primary Care

## 2020-04-15 DIAGNOSIS — G4700 Insomnia, unspecified: Secondary | ICD-10-CM

## 2020-05-02 ENCOUNTER — Other Ambulatory Visit: Payer: Self-pay | Admitting: Primary Care

## 2020-05-02 DIAGNOSIS — G4700 Insomnia, unspecified: Secondary | ICD-10-CM

## 2020-07-29 ENCOUNTER — Emergency Department: Payer: 59

## 2020-07-29 ENCOUNTER — Emergency Department
Admission: EM | Admit: 2020-07-29 | Discharge: 2020-07-29 | Disposition: A | Discharge status: Home / Self Care | Payer: 59 | Attending: Emergency Medicine | Admitting: Emergency Medicine

## 2020-07-29 ENCOUNTER — Other Ambulatory Visit: Payer: Self-pay

## 2020-07-29 DIAGNOSIS — B9689 Other specified bacterial agents as the cause of diseases classified elsewhere: Secondary | ICD-10-CM | POA: Insufficient documentation

## 2020-07-29 DIAGNOSIS — R102 Pelvic and perineal pain: Secondary | ICD-10-CM

## 2020-07-29 DIAGNOSIS — I129 Hypertensive chronic kidney disease with stage 1 through stage 4 chronic kidney disease, or unspecified chronic kidney disease: Secondary | ICD-10-CM | POA: Insufficient documentation

## 2020-07-29 DIAGNOSIS — Z79899 Other long term (current) drug therapy: Secondary | ICD-10-CM | POA: Insufficient documentation

## 2020-07-29 DIAGNOSIS — J441 Chronic obstructive pulmonary disease with (acute) exacerbation: Secondary | ICD-10-CM | POA: Insufficient documentation

## 2020-07-29 DIAGNOSIS — N183 Chronic kidney disease, stage 3 unspecified: Secondary | ICD-10-CM | POA: Insufficient documentation

## 2020-07-29 DIAGNOSIS — N3090 Cystitis, unspecified without hematuria: Secondary | ICD-10-CM | POA: Diagnosis not present

## 2020-07-29 DIAGNOSIS — Z7982 Long term (current) use of aspirin: Secondary | ICD-10-CM | POA: Insufficient documentation

## 2020-07-29 DIAGNOSIS — F17210 Nicotine dependence, cigarettes, uncomplicated: Secondary | ICD-10-CM | POA: Insufficient documentation

## 2020-07-29 DIAGNOSIS — R1030 Lower abdominal pain, unspecified: Secondary | ICD-10-CM | POA: Diagnosis present

## 2020-07-29 DIAGNOSIS — E039 Hypothyroidism, unspecified: Secondary | ICD-10-CM | POA: Insufficient documentation

## 2020-07-29 LAB — URINALYSIS, COMPLETE (UACMP) WITH MICROSCOPIC
Bilirubin Urine: NEGATIVE
Glucose, UA: NEGATIVE mg/dL
Hgb urine dipstick: NEGATIVE
Ketones, ur: NEGATIVE mg/dL
Nitrite: NEGATIVE
Protein, ur: NEGATIVE mg/dL
Specific Gravity, Urine: 1.011 (ref 1.005–1.030)
pH: 5 (ref 5.0–8.0)

## 2020-07-29 LAB — COMPREHENSIVE METABOLIC PANEL
ALT: 16 U/L (ref 0–44)
AST: 24 U/L (ref 15–41)
Albumin: 4 g/dL (ref 3.5–5.0)
Alkaline Phosphatase: 85 U/L (ref 38–126)
Anion gap: 9 (ref 5–15)
BUN: 23 mg/dL — ABNORMAL HIGH (ref 6–20)
CO2: 24 mmol/L (ref 22–32)
Calcium: 9.6 mg/dL (ref 8.9–10.3)
Chloride: 101 mmol/L (ref 98–111)
Creatinine, Ser: 1.45 mg/dL — ABNORMAL HIGH (ref 0.44–1.00)
GFR, Estimated: 42 mL/min — ABNORMAL LOW (ref >60)
Glucose, Bld: 106 mg/dL — ABNORMAL HIGH (ref 70–99)
Potassium: 5.3 mmol/L — ABNORMAL HIGH (ref 3.5–5.1)
Sodium: 134 mmol/L — ABNORMAL LOW (ref 135–145)
Total Bilirubin: 0.8 mg/dL (ref 0.3–1.2)
Total Protein: 7.1 g/dL (ref 6.5–8.1)

## 2020-07-29 LAB — POC URINE PREG, ED: Preg Test, Ur: NEGATIVE

## 2020-07-29 LAB — CBC
HCT: 41.4 % (ref 36.0–46.0)
Hemoglobin: 14.2 g/dL (ref 12.0–15.0)
MCH: 35 pg — ABNORMAL HIGH (ref 26.0–34.0)
MCHC: 34.3 g/dL (ref 30.0–36.0)
MCV: 102 fL — ABNORMAL HIGH (ref 80.0–100.0)
Platelets: 358 10*3/uL (ref 150–400)
RBC: 4.06 MIL/uL (ref 3.87–5.11)
RDW: 12.8 % (ref 11.5–15.5)
WBC: 12.5 10*3/uL — ABNORMAL HIGH (ref 4.0–10.5)
nRBC: 0 % (ref 0.0–0.2)

## 2020-07-29 LAB — LIPASE, BLOOD: Lipase: 29 U/L (ref 11–51)

## 2020-07-29 MED ORDER — HALOPERIDOL LACTATE 5 MG/ML IJ SOLN
2.5000 mg | Freq: Once | INTRAMUSCULAR | Status: AC
  Administered 2020-07-29: 2.5 mg via INTRAVENOUS
  Filled 2020-07-29: qty 1

## 2020-07-29 MED ORDER — KETOROLAC TROMETHAMINE 30 MG/ML IJ SOLN
15.0000 mg | Freq: Once | INTRAMUSCULAR | Status: AC
  Administered 2020-07-29: 15 mg via INTRAVENOUS
  Filled 2020-07-29: qty 1

## 2020-07-29 MED ORDER — SODIUM CHLORIDE 0.9 % IV SOLN
1.0000 g | Freq: Once | INTRAVENOUS | Status: AC
  Administered 2020-07-29: 1 g via INTRAVENOUS
  Filled 2020-07-29: qty 10

## 2020-07-29 MED ORDER — HALOPERIDOL LACTATE 5 MG/ML IJ SOLN: 2.0000 mg | Freq: Once | INTRAMUSCULAR | Status: DC

## 2020-07-29 MED ORDER — PHENAZOPYRIDINE HCL 95 MG PO TABS
95.0000 mg | ORAL_TABLET | Freq: Three times a day (TID) | ORAL | 0 refills | Status: AC | PRN
Start: 2020-07-29 — End: 2020-08-01

## 2020-07-29 MED ORDER — SODIUM CHLORIDE 0.9 % IV BOLUS
1000.0000 mL | Freq: Once | INTRAVENOUS | Status: AC
  Administered 2020-07-29: 1000 mL via INTRAVENOUS

## 2020-07-29 MED ORDER — MORPHINE SULFATE (PF) 4 MG/ML IV SOLN
4.0000 mg | Freq: Once | INTRAVENOUS | Status: AC
  Administered 2020-07-29: 4 mg via INTRAVENOUS
  Filled 2020-07-29: qty 1

## 2020-07-29 MED ORDER — CEPHALEXIN 500 MG PO CAPS: 500.0000 mg | ORAL_CAPSULE | Freq: Three times a day (TID) | ORAL | 0 refills | Status: AC

## 2020-07-29 MED ORDER — NAPROXEN 250 MG PO TABS: 250.0000 mg | ORAL_TABLET | Freq: Two times a day (BID) | ORAL | 0 refills | Status: AC | PRN

## 2020-07-29 MED ORDER — ONDANSETRON HCL 4 MG/2ML IJ SOLN
4.0000 mg | Freq: Once | INTRAMUSCULAR | Status: AC
  Administered 2020-07-29: 4 mg via INTRAVENOUS
  Filled 2020-07-29: qty 2

## 2020-07-29 MED ORDER — TRAMADOL HCL 50 MG PO TABS: 50.0000 mg | ORAL_TABLET | Freq: Two times a day (BID) | ORAL | 0 refills | Status: AC | PRN

## 2020-07-29 NOTE — ED Triage Notes (Signed)
Pt comes with c/o abdominal pain and weightloss.

## 2020-07-29 NOTE — ED Notes (Signed)
See triage note  Presents with some abd pain   States pain started on Sunday  Has gotten worse  Feels like she is distended  No fever

## 2020-07-29 NOTE — ED Provider Notes (Signed)
[facility] Emergency Department Provider Note  ____________________________________________   Event Date/Time   First MD Initiated Contact with Patient 07/29/20 1321     (approximate)  I have reviewed the triage vital signs and the nursing notes.   HISTORY  Chief Complaint Abdominal Pain    HPI [name] is a 60 y.o. female with history of intermittent abdominal pain, diverticulitis, renal failure, COPD, here with lower abdominal pain.  The patient states that starting on Sunday, she developed acute, severe, left lower quadrant abdominal pain.  This pain radiates towards her right lower quadrant and suprapubic area.  She has had some urinary urgency and difficulty urinating with this.  She has had some nausea but no vomiting.  She states she has had some abdominal distention with this.  She has a history of prior tonic abdominal pain and has seen GI for this.  Denies fevers.  No known sick contacts.  Pain is worse with palpation and eating.  No alleviating factors.        Past Medical History:  Diagnosis Date  . Acute diverticulitis 07/16/2013  . Acute renal failure ([place]) 07/16/2013  . Arthritis    hands, back, knees  . Colitis 07/16/2013  . Diverticulitis   . Elevated CEA 08/15/2017  . GERD (gastroesophageal reflux disease)   . GIB (gastrointestinal bleeding) 01/07/2012  . Hypertension    somewhat controlled, taking medication  . Hypothyroidism   . Lesion of epiglottis   . Macrocytosis without anemia 08/15/2017  . Orthostasis 08/22/2017    Patient Active Problem List   Diagnosis Date Noted  . Abnormal drug screen (12/12/2019) 01/13/2020  . Substance use disorder 01/13/2020  . Long term prescription benzodiazepine use 01/13/2020  . Chronic, continuous use of opioids 01/13/2020  . 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 Lumbar facet syndrome (Bilateral) (R>L) 12/12/2019  . Chronic sacroiliac joint pain (Left) 12/12/2019  . Chronic pain syndrome  12/11/2019  . Pharmacologic therapy 12/11/2019  . Disorder of skeletal system 12/11/2019  . Problems influencing health status 12/11/2019  . Benign hypertensive kidney disease with chronic kidney disease 11/07/2019  . Insomnia 09/12/2019  . Cramps of lower extremity 09/12/2019  . Aortic atherosclerosis ([place]) 09/12/2019  . GERD (gastroesophageal reflux disease) 09/12/2019  . Acute respiratory failure with hypoxia ([place]) 06/05/2019  . COPD with acute exacerbation ([place]) 06/05/2019  . Acquired hypothyroidism 06/05/2019  . Chronic low back pain (1ry area of Pain) (Bilateral) w/o sciatica 08/16/2018  . Stage 3 chronic kidney disease ([place]) 08/16/2018  . Insomnia due to other mental disorder 08/16/2018  . Lesion of epiglottis 08/10/2018  . Chronic abdominal pain 01/03/2018  . Orthostasis 08/22/2017  . Hypercalcemia 08/15/2017  . Macrocytosis 08/15/2017  . Lung nodules 05/07/2016  . COPD (chronic obstructive pulmonary disease) ([place]) 05/07/2016  . Chronic obstructive pulmonary disease ([place]) 05/07/2016  . HTN (hypertension) 07/16/2013  . Protein-calorie malnutrition, severe ([place]) 01/12/2013  . Alcohol abuse 01/07/2012  . Hypothyroidism 03/03/2010    Past Surgical History:  Procedure Laterality Date  . ABDOMINAL HYSTERECTOMY    . APPENDECTOMY    . CHOLECYSTECTOMY    . COLONOSCOPY  01/09/2012   Procedure: COLONOSCOPY;  Surgeon: Lear Ng, MD;  Location: [facility] ENDOSCOPY;  Service: Endoscopy;  Laterality: N/A;  . COLONOSCOPY WITH PROPOFOL N/A 08/25/2017   Procedure: COLONOSCOPY WITH PROPOFOL;  Surgeon: Jonathon Bellows, MD;  Location: [facility] ENDOSCOPY;  Service: Gastroenterology;  Laterality: N/A;  . ESOPHAGOGASTRODUODENOSCOPY  01/07/2012   Procedure: ESOPHAGOGASTRODUODENOSCOPY (EGD);  Surgeon:  Winfield Cunas., MD;  Location: [facility] ENDOSCOPY;  Service: Endoscopy;  Laterality: N/A;  . ESOPHAGOGASTRODUODENOSCOPY (EGD) WITH PROPOFOL N/A 08/25/2017   Procedure: ESOPHAGOGASTRODUODENOSCOPY (EGD) WITH  PROPOFOL;  Surgeon: Jonathon Bellows, MD;  Location: [facility] ENDOSCOPY;  Service: Gastroenterology;  Laterality: N/A;  . VIDEO BRONCHOSCOPY Bilateral 05/14/2016   Procedure: VIDEO BRONCHOSCOPY WITHOUT FLUORO;  Surgeon: Collene Gobble, MD;  Location: [place];  Service: Cardiopulmonary;  Laterality: Bilateral;    Prior to Admission medications   Medication Sig Start Date End Date Taking? Authorizing Provider  cephALEXin (KEFLEX) 500 MG capsule Take 1 capsule (500 mg total) by mouth 3 (three) times daily for 7 days. 07/29/20 08/05/20 Yes Duffy Bruce, MD  naproxen (NAPROSYN) 250 MG tablet Take 1 tablet (250 mg total) by mouth 2 (two) times daily as needed for up to 5 days for moderate pain. 07/29/20 08/03/20 Yes Duffy Bruce, MD  phenazopyridine (PYRIDIUM) 95 MG tablet Take 1 tablet (95 mg total) by mouth 3 (three) times daily as needed for up to 3 days for pain. 07/29/20 08/01/20 Yes Duffy Bruce, MD  traMADol (ULTRAM) 50 MG tablet Take 1 tablet (50 mg total) by mouth every 12 (twelve) hours as needed for severe pain. 07/29/20 07/29/21 Yes Duffy Bruce, MD  albuterol (VENTOLIN HFA) 108 (90 Base) MCG/ACT inhaler TAKE 2 PUFFS BY MOUTH EVERY 6 HOURS AS NEEDED FOR WHEEZE 06/26/19   [provider]  aspirin EC 81 MG tablet Take 81 mg by mouth daily.    [provider]  Biotin 5000 MCG TABS Take by mouth daily.    [provider]  [place] Patter [phone] MCG/INH AEPB TAKE 1 PUFF BY MOUTH EVERY DAY 02/25/20   Pleas Koch, NP  Cholecalciferol (VITAMIN D-3) 5000 UNITS TABS Take 10,000 Units by mouth daily after breakfast.    [provider]  Cyanocobalamin (B-12) 5000 MCG CAPS Take by mouth daily. Patient not taking: Reported on 03/13/2020    [provider]  diclofenac Sodium (VOLTAREN) 1 % GEL APPLY TO THUMB TO 3 TIMES A DAY AS NEEDED FOR PAIN. 09/24/19   [provider]  gabapentin (NEURONTIN) 300 MG capsule Take 300-600 mg by mouth at bedtime. 12/27/19    [provider]  hydrocortisone (ANUSOL-HC) 2.5 % rectal cream Place 1 application rectally 2 (two) times daily. 03/13/20   Lin Landsman, MD  levothyroxine (SYNTHROID, LEVOTHROID) 50 MCG tablet Take 50 mcg by mouth daily before breakfast.    [provider]  lisinopril (ZESTRIL) 10 MG tablet Take 10 mg by mouth daily. 07/15/19   [provider]  meloxicam (MOBIC) 7.5 MG tablet Take 7.5 mg by mouth 2 (two) times daily. Patient not taking: Reported on 03/13/2020 12/13/19   [provider]  Multiple Vitamin (MULTIVITAMIN) capsule Take by mouth.    [provider]  ondansetron (ZOFRAN-ODT) 8 MG disintegrating tablet SMARTSIG:1 Tablet(s) By Mouth Every 12 Hours PRN 09/29/19   [provider]  pantoprazole (PROTONIX) 20 MG tablet Take 20 mg by mouth daily.  02/16/16   [provider]  Probiotic Product (PROBIOTIC-10 PO) Take by mouth daily.    [provider]  rosuvastatin (CRESTOR) 5 MG tablet Take 1 tablet (5 mg total) by mouth every evening. For cholesterol. 09/17/19   Pleas Koch, NP  traZODone (DESYREL) 50 MG tablet TAKE 1 TABLET (50 MG TOTAL) BY MOUTH AT BEDTIME AS NEEDED FOR SLEEP. 04/15/20   Pleas Koch, NP  Zinc 25 MG TABS Take 25  mg by mouth.    [provider]    Allergies Patient has no known allergies.  Family History  Problem Relation Age of Onset  . Breast cancer Mother        multiple types of cancer  . Stomach cancer Mother   . Throat cancer Mother   . Melanoma Mother   . Kidney disease Father     Social History Social History   Tobacco Use  . Smoking status: Current Some Day Smoker    Packs/day: 2.00    Years: 42.00    Pack years: 84.00    Types: Cigarettes    Last attempt to quit: 07/07/2015    Years since quitting: 5.0  . Smokeless tobacco: Never Used  . Tobacco comment: Patient states she smoke 2 cigarettes a day  Vaping Use  . Vaping Use: Former  Substance Use Topics   . Alcohol use: Yes    Alcohol/week: 35.0 standard drinks    Types: 7 Glasses of wine, 28 Shots of liquor per week    Comment: 01/10/13 - quit 6 to 7 months ago, prior heavy use  . Drug use: No    Review of Systems  Review of Systems  Constitutional: Positive for fatigue. Negative for fever.  HENT: Negative for congestion and sore throat.   Eyes: Negative for visual disturbance.  Respiratory: Negative for cough and shortness of breath.   Cardiovascular: Negative for chest pain.  Gastrointestinal: Positive for abdominal pain and nausea. Negative for diarrhea and vomiting.  Genitourinary: Negative for flank pain.  Musculoskeletal: Negative for back pain and neck pain.  Skin: Negative for rash and wound.  Neurological: Negative for weakness.  All other systems reviewed and are negative.    ____________________________________________  PHYSICAL EXAM:      VITAL SIGNS: ED Triage Vitals [07/29/20 1151]  Enc Vitals Group     BP 125/65     Pulse Rate 68     Resp 18     Temp 98 F (36.7 C)     Temp Source Oral     SpO2 100 %     Weight 87 lb (39.5 kg)     Height 4\' 10"  (1.473 m)     Head Circumference      Peak Flow      Pain Score 10     Pain Loc      Pain Edu?      Excl. in [place]?      Physical Exam Vitals and nursing note reviewed.  Constitutional:      General: She is not in acute distress.    Appearance: She is well-developed.  HENT:     Head: Normocephalic and atraumatic.  Eyes:     Conjunctiva/sclera: Conjunctivae normal.  Cardiovascular:     Rate and Rhythm: Normal rate and regular rhythm.     Heart sounds: Normal heart sounds. No murmur heard. No friction rub.  Pulmonary:     Effort: Pulmonary effort is normal. No respiratory distress.     Breath sounds: Normal breath sounds. No wheezing or rales.  Abdominal:     General: There is no distension.     Palpations: Abdomen is soft.     Tenderness: There is abdominal tenderness in the right lower quadrant,  suprapubic area and left lower quadrant. There is guarding. There is no rebound.  Musculoskeletal:     Cervical back: Neck supple.  Skin:    General: Skin is warm.     Capillary Refill:  Capillary refill takes less than 2 seconds.  Neurological:     Mental Status: She is alert and oriented to person, place, and time.     Motor: No abnormal muscle tone.       ____________________________________________   LABS (all labs ordered are listed, but only abnormal results are displayed)  Labs Reviewed  COMPREHENSIVE METABOLIC PANEL - Abnormal; Notable for the following components:      Result Value   Sodium 134 (*)    Potassium 5.3 (*)    Glucose, Bld 106 (*)    BUN 23 (*)    Creatinine, Ser 1.45 (*)    GFR, Estimated 42 (*)    All other components within normal limits  CBC - Abnormal; Notable for the following components:   WBC 12.5 (*)    MCV 102.0 (*)    MCH 35.0 (*)    All other components within normal limits  URINALYSIS, COMPLETE (UACMP) WITH MICROSCOPIC - Abnormal; Notable for the following components:   Color, Urine YELLOW (*)    APPearance HAZY (*)    Leukocytes,Ua SMALL (*)    Bacteria, UA FEW (*)    All other components within normal limits  URINE CULTURE  LIPASE, BLOOD  POC URINE PREG, ED    ____________________________________________  EKG:  ________________________________________  RADIOLOGY All imaging, including plain films, CT scans, and ultrasounds, independently reviewed by me, and interpretations confirmed via formal radiology reads.  ED MD interpretation:   CT A/P: No acute findings, low-attenuation lesion R kidney noted incidentally  Official radiology report(s): CT ABDOMEN PELVIS WO CONTRAST  Result Date: 07/29/2020 CLINICAL DATA:  Lower abdominal pain, abdominal distension, and nausea for 3 days. EXAM: CT ABDOMEN AND PELVIS WITHOUT CONTRAST TECHNIQUE: Multidetector CT imaging of the abdomen and pelvis was performed following the standard  protocol without IV contrast. COMPARISON:  01/04/2020 FINDINGS: Lower chest: No acute findings. Hepatobiliary: No mass visualized on this unenhanced exam. Prior cholecystectomy. No evidence of biliary obstruction. Pancreas: No mass or inflammatory process visualized on this unenhanced exam. Spleen:  Within normal limits in size. Adrenals/Urinary tract: 1.7 cm low-attenuation lesion in the medial midpole of the right kidney appears mildly increased in size, but cannot be characterized on this unenhanced exam. A few calcifications in the left renal hilum are likely vascular in origin. No evidence of hydronephrosis. Unremarkable unopacified urinary bladder. Stomach/Bowel: No evidence of obstruction, inflammatory process, or abnormal fluid collections. Colonic diverticulosis again noted, however there is no evidence of diverticulitis. Vascular/Lymphatic: No pathologically enlarged lymph nodes identified. No evidence of abdominal aortic aneurysm. Aortic atherosclerotic calcification noted. Reproductive: Prior hysterectomy noted. Adnexal regions are unremarkable in appearance. Other:  None. Musculoskeletal:  No suspicious bone lesions identified. IMPRESSION: No acute findings. Colonic diverticulosis, without radiographic evidence of diverticulitis. 1.7 cm indeterminate low-attenuation lesion in right kidney appears mildly increased in size, but cannot be characterized on this unenhanced exam. Abdomen MRI without and with contrast is recommended for further characterization. Aortic Atherosclerosis (ICD10-I70.0). Electronically Signed   By: Marlaine Hind M.D.   On: 07/29/2020 14:18    ____________________________________________  PROCEDURES   Procedure(s) performed (including Critical Care):  Procedures  ____________________________________________  INITIAL IMPRESSION / MDM / [place] / ED COURSE  As part of my medical decision making, I reviewed the following data within the electronic medical  record:  Nursing notes reviewed and incorporated, Old chart reviewed, Notes from prior ED visits, and [place]  was evaluated in Emergency Department on 07/29/2020 for the symptoms described in the history of present illness. She was evaluated in the context of the global COVID-19 pandemic, which necessitated consideration that the patient might be at risk for infection with the SARS-CoV-2 virus that causes COVID-19. Institutional protocols and algorithms that pertain to the evaluation of patients at risk for COVID-19 are in a state of rapid change based on information released by regulatory bodies including the CDC and federal and state organizations. These policies and algorithms were followed during the patient's care in the ED.  Some ED evaluations and interventions may be delayed as a result of limited staffing during the pandemic.*     Medical Decision Making:  60 yo F here with abdominal pain, dysuria. Labs, imaging as above. Labs show moderate leukocytosis.  CMP with baseline CKD but no acute abnormality.  LFTs and lipase are normal.  Urinalysis does show mild pyuria and bacteriuria, and given her symptoms raises question of cystitis with possible bladder spasm.  CT reviewed, shows no acute normality.  She has an incidental note of a lesion in the right kidney.  I reviewed her prior imaging and this has been characterized as a cyst previously.  That being said, will notify her of this and have outpatient follow-up arranged for possible MRI.  Otherwise, this is unlikely to be abscess based on imaging and story.  She has some ongoing pain but also has a history of well-documented chronic pelvic pain which I feel is contributing.  No apparent emergent medical conditions.  Will treat her for possible UTI, give very brief course of analgesia, and refer for outpatient follow-up.  ____________________________________________  FINAL CLINICAL IMPRESSION(S) /  ED DIAGNOSES  Final diagnoses:  Cystitis  Pelvic pain     MEDICATIONS GIVEN DURING THIS VISIT:  Medications  morphine 4 MG/ML injection 4 mg (4 mg Intravenous Given 07/29/20 1409)  ondansetron (ZOFRAN) injection 4 mg (4 mg Intravenous Given 07/29/20 1409)  sodium chloride 0.9 % bolus 1,000 mL (0 mLs Intravenous Stopped 07/29/20 1522)  ketorolac (TORADOL) 30 MG/ML injection 15 mg (15 mg Intravenous Given 07/29/20 1456)  haloperidol lactate (HALDOL) injection 2.5 mg (2.5 mg Intravenous Given 07/29/20 1517)  cefTRIAXone (ROCEPHIN) 1 g in sodium chloride 0.9 % 100 mL IVPB (0 g Intravenous Stopped 07/29/20 1548)     ED Discharge Orders         Ordered    cephALEXin (KEFLEX) 500 MG capsule  3 times daily        07/29/20 1547    phenazopyridine (PYRIDIUM) 95 MG tablet  3 times daily PRN        07/29/20 1547    naproxen (NAPROSYN) 250 MG tablet  2 times daily PRN        07/29/20 1547    traMADol (ULTRAM) 50 MG tablet  Every 12 hours PRN        07/29/20 1547           Note:  This document was prepared using Dragon voice recognition software and may include unintentional dictation errors.   Duffy Bruce, MD 07/29/20 [id number]

## 2020-07-29 NOTE — ED Triage Notes (Signed)
Pt c/o lower abd pain with distention and nausea since Sunday, denies vomiting, diarrhea or constipation. States this has happened before but has never gone to a GI specialist.

## 2020-07-31 LAB — URINE CULTURE: Culture: NO GROWTH

## 2020-08-25 ENCOUNTER — Emergency Department (HOSPITAL_COMMUNITY): Admission: EM | Admit: 2020-08-25 | Discharge: 2020-08-25 | Discharge status: Against Medical Advice | Payer: 59

## 2020-08-25 ENCOUNTER — Other Ambulatory Visit: Payer: Self-pay

## 2020-08-25 NOTE — ED Notes (Signed)
No response in waiting room.

## 2021-06-04 ENCOUNTER — Encounter: Payer: Self-pay | Admitting: Urgent Care

## 2021-10-10 ENCOUNTER — Emergency Department: Payer: Commercial Managed Care - HMO

## 2021-10-10 ENCOUNTER — Encounter: Payer: Self-pay | Admitting: Urgent Care

## 2021-10-10 ENCOUNTER — Emergency Department
Admission: EM | Admit: 2021-10-10 | Discharge: 2021-10-10 | Disposition: A | Discharge status: Home / Self Care | Payer: Commercial Managed Care - HMO | Attending: Emergency Medicine | Admitting: Emergency Medicine

## 2021-10-10 DIAGNOSIS — R946 Abnormal results of thyroid function studies: Secondary | ICD-10-CM | POA: Insufficient documentation

## 2021-10-10 DIAGNOSIS — I10 Essential (primary) hypertension: Secondary | ICD-10-CM | POA: Insufficient documentation

## 2021-10-10 DIAGNOSIS — N281 Cyst of kidney, acquired: Secondary | ICD-10-CM | POA: Diagnosis not present

## 2021-10-10 DIAGNOSIS — I700 Atherosclerosis of aorta: Secondary | ICD-10-CM | POA: Diagnosis not present

## 2021-10-10 DIAGNOSIS — D72829 Elevated white blood cell count, unspecified: Secondary | ICD-10-CM | POA: Insufficient documentation

## 2021-10-10 DIAGNOSIS — R072 Precordial pain: Secondary | ICD-10-CM | POA: Diagnosis present

## 2021-10-10 DIAGNOSIS — E039 Hypothyroidism, unspecified: Secondary | ICD-10-CM | POA: Insufficient documentation

## 2021-10-10 DIAGNOSIS — J439 Emphysema, unspecified: Secondary | ICD-10-CM | POA: Diagnosis not present

## 2021-10-10 DIAGNOSIS — R8289 Other abnormal findings on cytological and histological examination of urine: Secondary | ICD-10-CM | POA: Insufficient documentation

## 2021-10-10 DIAGNOSIS — I2510 Atherosclerotic heart disease of native coronary artery without angina pectoris: Secondary | ICD-10-CM | POA: Diagnosis not present

## 2021-10-10 DIAGNOSIS — R1013 Epigastric pain: Secondary | ICD-10-CM

## 2021-10-10 DIAGNOSIS — K2980 Duodenitis without bleeding: Secondary | ICD-10-CM

## 2021-10-10 DIAGNOSIS — Z720 Tobacco use: Secondary | ICD-10-CM

## 2021-10-10 DIAGNOSIS — R079 Chest pain, unspecified: Secondary | ICD-10-CM

## 2021-10-10 DIAGNOSIS — R944 Abnormal results of kidney function studies: Secondary | ICD-10-CM | POA: Diagnosis not present

## 2021-10-10 DIAGNOSIS — N179 Acute kidney failure, unspecified: Secondary | ICD-10-CM

## 2021-10-10 LAB — URINALYSIS, COMPLETE (UACMP) WITH MICROSCOPIC
Bilirubin Urine: NEGATIVE
Glucose, UA: NEGATIVE mg/dL
Hgb urine dipstick: NEGATIVE
Ketones, ur: NEGATIVE mg/dL
Nitrite: NEGATIVE
Protein, ur: NEGATIVE mg/dL
Specific Gravity, Urine: 1.013 (ref 1.005–1.030)
pH: 5 (ref 5.0–8.0)

## 2021-10-10 LAB — LIPASE, BLOOD: Lipase: 31 U/L (ref 11–51)

## 2021-10-10 LAB — COMPREHENSIVE METABOLIC PANEL
ALT: 12 U/L (ref 0–44)
AST: 19 U/L (ref 15–41)
Albumin: 4.1 g/dL (ref 3.5–5.0)
Alkaline Phosphatase: 92 U/L (ref 38–126)
Anion gap: 11 (ref 5–15)
BUN: 21 mg/dL — ABNORMAL HIGH (ref 6–20)
CO2: 23 mmol/L (ref 22–32)
Calcium: 10.3 mg/dL (ref 8.9–10.3)
Chloride: 105 mmol/L (ref 98–111)
Creatinine, Ser: 1.75 mg/dL — ABNORMAL HIGH (ref 0.44–1.00)
GFR, Estimated: 33 mL/min — ABNORMAL LOW (ref >60)
Glucose, Bld: 111 mg/dL — ABNORMAL HIGH (ref 70–99)
Potassium: 4.4 mmol/L (ref 3.5–5.1)
Sodium: 139 mmol/L (ref 135–145)
Total Bilirubin: 0.5 mg/dL (ref 0.3–1.2)
Total Protein: 7.6 g/dL (ref 6.5–8.1)

## 2021-10-10 LAB — CBC
HCT: 41.7 % (ref 36.0–46.0)
Hemoglobin: 13.7 g/dL (ref 12.0–15.0)
MCH: 31.8 pg (ref 26.0–34.0)
MCHC: 32.9 g/dL (ref 30.0–36.0)
MCV: 96.8 fL (ref 80.0–100.0)
Platelets: 276 10*3/uL (ref 150–400)
RBC: 4.31 MIL/uL (ref 3.87–5.11)
RDW: 14.5 % (ref 11.5–15.5)
WBC: 12 10*3/uL — ABNORMAL HIGH (ref 4.0–10.5)
nRBC: 0 % (ref 0.0–0.2)

## 2021-10-10 LAB — TROPONIN I (HIGH SENSITIVITY)
Troponin I (High Sensitivity): 4 ng/L (ref <18)
Troponin I (High Sensitivity): 3 ng/L (ref <18)

## 2021-10-10 LAB — TSH: TSH: 4.106 u[IU]/mL (ref 0.350–4.500)

## 2021-10-10 LAB — T4, FREE: Free T4: 1.18 ng/dL — ABNORMAL HIGH (ref 0.61–1.12)

## 2021-10-10 MED ORDER — IOHEXOL 350 MG/ML SOLN
75.0000 mL | Freq: Once | INTRAVENOUS | Status: AC | PRN
Start: 2021-10-10 — End: 2021-10-10
  Administered 2021-10-10: 75 mL via INTRAVENOUS

## 2021-10-10 MED ORDER — HYDROMORPHONE HCL 1 MG/ML IJ SOLN
0.5000 mg | Freq: Once | INTRAMUSCULAR | Status: AC
  Administered 2021-10-10: 0.5 mg via INTRAVENOUS
  Filled 2021-10-10: qty 0.5

## 2021-10-10 MED ORDER — OXYCODONE HCL 5 MG PO TABS
5.0000 mg | ORAL_TABLET | Freq: Four times a day (QID) | ORAL | 0 refills | Status: AC | PRN
Start: 2021-10-10 — End: 2021-10-13

## 2021-10-10 MED ORDER — OXYCODONE-ACETAMINOPHEN 5-325 MG PO TABS
1.0000 | ORAL_TABLET | Freq: Once | ORAL | Status: AC
  Administered 2021-10-10: 1 via ORAL
  Filled 2021-10-10: qty 1

## 2021-10-10 MED ORDER — NITROGLYCERIN 0.4 MG SL SUBL: 0.4000 mg | SUBLINGUAL_TABLET | SUBLINGUAL | Status: DC | PRN

## 2021-10-10 MED ORDER — SUCRALFATE 1 G PO TABS: 1.0000 g | ORAL_TABLET | Freq: Four times a day (QID) | ORAL | 0 refills | Status: DC

## 2021-10-10 MED ORDER — LACTATED RINGERS IV BOLUS
1000.0000 mL | Freq: Once | INTRAVENOUS | Status: AC
  Administered 2021-10-10: 1000 mL via INTRAVENOUS

## 2021-10-10 MED ORDER — PANTOPRAZOLE SODIUM 40 MG PO TBEC: 40.0000 mg | DELAYED_RELEASE_TABLET | Freq: Every day | ORAL | 0 refills | Status: DC

## 2021-10-10 MED ORDER — ALUM & MAG HYDROXIDE-SIMETH 200-200-20 MG/5ML PO SUSP
30.0000 mL | Freq: Once | ORAL | Status: AC
  Administered 2021-10-10: 30 mL via ORAL
  Filled 2021-10-10: qty 30

## 2021-10-10 MED ORDER — SUCRALFATE 1 G PO TABS
1.0000 g | ORAL_TABLET | Freq: Once | ORAL | Status: AC
  Administered 2021-10-10: 1 g via ORAL
  Filled 2021-10-10: qty 1

## 2021-10-10 MED ORDER — PANTOPRAZOLE SODIUM 40 MG IV SOLR
40.0000 mg | Freq: Once | INTRAVENOUS | Status: AC
  Administered 2021-10-10: 40 mg via INTRAVENOUS
  Filled 2021-10-10: qty 10

## 2021-10-10 NOTE — Discharge Instructions (Addendum)
1. No evidence of thoracic or abdominal aortic aneurysm or ?dissection. ?2. Mild inflammatory change adjacent the proximal duodenum as may be ?seen in the setting of duodenitis or peptic ulcer disease. ?3. Colonic diverticulosis without active inflammation. ?4. 1.4 cm right renal cyst unchanged. ?5. Aortic atherosclerosis, coronary artery disease.  Emphysema. ?  ?Aortic Atherosclerosis (ICD10-I70.0) and Emphysema (ICD10-J43.9). ?

## 2021-10-10 NOTE — ED Triage Notes (Addendum)
Chest paint since Tuesday. Reports getting nauseous this morning from it and wanted to come in.Denies heart issues but has copd. Some breathing issues with it. Was off her thyroid meds for a couple of months and restarted it this morning.  ?

## 2021-10-10 NOTE — ED Provider Notes (Addendum)
? ?[facility] ?Provider Note ? ? ? Event Date/Time  ? First MD Initiated Contact with Patient 10/10/21 1025   ?  (approximate) ? ? ?History  ? ?Chest Pain ? ? ?HPI ? ?[name] is a 61 y.o. female with a past medical history of hypothyroidism having recently started taking her hypothyroidism medicines again in the last day, arthritis, diverticulitis, GERD, HTN, and GI bleed who presents for evaluation of 5 days of progressively worsening substernal and epigastric abdominal pain rating to the back.  She states she had a little nausea and the pain makes her feel short of breath.  No cough, fevers, headache and earache, sore throat, vomiting, diarrhea, constipation or urinary symptoms.  No extremity weakness numbness or tingling.  No recent falls or injuries.  She denies any recent EtOH use or significant NSAID use.  Denies tobacco abuse or illicit drug use.  No prior similar to that.  No other acute concerns at this time. ? ?  ?Past Medical History:  ?Diagnosis Date  ? Acute diverticulitis 07/16/2013  ? Acute renal failure ([place]) 07/16/2013  ? Arthritis   ? hands, back, knees  ? Colitis 07/16/2013  ? Diverticulitis   ? Elevated CEA 08/15/2017  ? GERD (gastroesophageal reflux disease)   ? GIB (gastrointestinal bleeding) 01/07/2012  ? Hypertension   ? somewhat controlled, taking medication  ? Hypothyroidism   ? Lesion of epiglottis   ? Macrocytosis without anemia 08/15/2017  ? Orthostasis 08/22/2017  ? ? ? ?Physical Exam  ?Triage Vital Signs: ?ED Triage Vitals  ?Enc Vitals Group  ?   BP 10/10/21 1022 (!) 181/86  ?   Pulse Rate 10/10/21 1022 96  ?   Resp 10/10/21 1022 15  ?   Temp 10/10/21 1022 97.8 ?F (36.6 ?C)  ?   Temp Source 10/10/21 1022 Oral  ?   SpO2 10/10/21 1022 100 %  ?   Weight --   ?   Height --   ?   Head Circumference --   ?   Peak Flow --   ?   Pain Score 10/10/21 1018 10  ?   Pain Loc --   ?   Pain Edu? --   ?   Excl. in [place]? --   ? ? ?Most recent vital signs: ?Vitals:  ? 10/10/21 1200  10/10/21 1330  ?BP: (!) 182/77 (!) 161/81  ?Pulse: 85 82  ?Resp: 13 13  ?Temp:    ?SpO2: 100% 100%  ? ? ?General: Awake, very uncomfortable writhing in pain and crying throughout my initial exam. ?CV:  Good peripheral perfusion.  2+ radial pulse.  No audible murmur at this time ?Resp:  Normal effort.  Bilaterally ?Abd:  No distention.  Very tender in the substernal area and epigastric region.  No CVA tenderness or lower abdominal tenderness. ?Other:  No significant lower extremity edema ? ? ?ED Results / Procedures / Treatments  ?Labs ?(all labs ordered are listed, but only abnormal results are displayed) ?Labs Reviewed  ?CBC - Abnormal; Notable for the following components:  ?    Result Value  ? WBC 12.0 (*)   ? All other components within normal limits  ?T4, FREE - Abnormal; Notable for the following components:  ? Free T4 1.18 (*)   ? All other components within normal limits  ?COMPREHENSIVE METABOLIC PANEL - Abnormal; Notable for the following components:  ? Glucose, Bld 111 (*)   ? BUN 21 (*)   ? Creatinine,  Ser 1.75 (*)   ? GFR, Estimated 33 (*)   ? All other components within normal limits  ?URINALYSIS, COMPLETE (UACMP) WITH MICROSCOPIC - Abnormal; Notable for the following components:  ? Color, Urine YELLOW (*)   ? APPearance HAZY (*)   ? Leukocytes,Ua TRACE (*)   ? Bacteria, UA RARE (*)   ? All other components within normal limits  ?TSH  ?LIPASE, BLOOD  ?TROPONIN I (HIGH SENSITIVITY)  ?TROPONIN I (HIGH SENSITIVITY)  ? ? ? ?EKG ? ?ECG is remarkable sinus rhythm with a ventricular rate of 96, normal axis, unremarkable intervals with nonspecific change versus artifact in V2 without other clear evidence of acute ischemia or significant arrhythmia. ? ?RADIOLOGY ?Chest reviewed by myself shows no focal consoidation, effusion, edema, pneumothorax or other clear acute thoracic process. I also reviewed radiology interpretation and agree with findings described. ? ?CTA chest my interpretation without evidence of  dissection, large PE, pneumonia, pneumothorax, focal consolidation, bowel perforation or diverticulitis or other acute abdominal pelvic process aside from some mild stranding around the duodenum.  I reviewed radiologist interpretation.  There are findings of some inflammatory changes around the proximal duodenum seen in the setting of duodenitis peptic ulcer disease as well as a 1.4 cm unchanged right renal cyst and evidence of aortic atherosclerosis, CAD and emphysema.  ? ?Renal ultrasound on my review without evidence of hydronephrosis.  I reviewed radiology interpretation and agree with their findings of small kidneys with slightly increased renal echogenicity bilaterally consistent with medical renal disease without evidence of hydronephrosis or mass. ? ?PROCEDURES: ? ?Critical Care performed: No ? ?.1-3 Lead EKG Interpretation ?Performed by: Lucrezia Starch, MD ?Authorized by: Lucrezia Starch, MD  ? ?  Interpretation: normal   ?  ECG rate assessment: normal   ?  Rhythm: sinus rhythm   ?  Ectopy: none   ?  Conduction: normal   ? ?The patient is on the cardiac monitor to evaluate for evidence of arrhythmia and/or significant heart rate changes. ? ? ?MEDICATIONS ORDERED IN ED: ?Medications  ?nitroGLYCERIN (NITROSTAT) SL tablet 0.4 mg (has no administration in time range)  ?HYDROmorphone (DILAUDID) injection 0.5 mg (0.5 mg Intravenous Given 10/10/21 1119)  ?pantoprazole (PROTONIX) injection 40 mg (40 mg Intravenous Given 10/10/21 1119)  ?lactated ringers bolus 1,000 mL (1,000 mLs Intravenous New Bag/Given 10/10/21 1310)  ?iohexol (OMNIPAQUE) 350 MG/ML injection 75 mL (75 mLs Intravenous Contrast Given 10/10/21 1228)  ?alum & mag hydroxide-simeth (MAALOX/MYLANTA) 200-200-20 MG/5ML suspension 30 mL (30 mLs Oral Given 10/10/21 1340)  ?sucralfate (CARAFATE) tablet 1 g (1 g Oral Given 10/10/21 1340)  ? ? ? ?IMPRESSION / MDM / ASSESSMENT AND PLAN / ED COURSE  ?I reviewed the triage vital signs and the nursing notes. ?              ?               ? ?Differential diagnosis includes, but is not limited to ACS, pneumothorax, pneumonia, dissection, pancreatitis, peptic ulcer disease, cholecystitis, gastritis with overall lower suspicion for PE given absence of any tachycardia, hypoxia or tachypnea or clear risk factors and pain very reproducible on palpation in the epigastric area. ? ?ECG is remarkable sinus rhythm with a ventricular rate of 96, normal axis, unremarkable intervals with nonspecific change versus artifact in V2 without other clear evidence of acute ischemia or significant arrhythmia.  Nonelevated troponin x2 is not suggestive of ACS or myocarditis. ? ?Chest reviewed by myself shows no focal consoidation,  effusion, edema, pneumothorax or other clear acute thoracic process. I also reviewed radiology interpretation and agree with findings described. ? ?CTA chest my interpretation without evidence of dissection, large PE, pneumonia, pneumothorax, focal consolidation, bowel perforation or diverticulitis or other acute abdominal pelvic process aside from some mild stranding around the duodenum.  I reviewed radiologist interpretation.  There are findings of some inflammatory changes around the proximal duodenum seen in the setting of duodenitis peptic ulcer disease as well as a 1.4 cm unchanged right renal cyst and evidence of aortic atherosclerosis, CAD and emphysema.  ? ?Renal ultrasound on my review without evidence of hydronephrosis.  I reviewed radiology interpretation and agree with their findings of small kidneys with slightly increased renal echogenicity bilaterally consistent with medical renal disease without evidence of hydronephrosis or mass. ? ?CBC shows WBC count of 12, hemoglobin of 13.7 and normal platelets.  TSH is within normal limits.  Free T4 slightly elevated at 1.18.  CMP is remarkable for evidence of an AKI with a creatinine of 1.75 compared to 1.45 a year ago without any other new significant electrolyte or  metabolic derangements.  No evidence of acute hepatitis or cholestatic process.  Lipase not suggestive pancreatitis.  UA is remarkable for some trace leukocyte esterase and 6-10 WBCs with rare bacteria and some sq

## 2021-11-12 ENCOUNTER — Other Ambulatory Visit: Payer: Self-pay | Admitting: Endocrinology

## 2021-11-12 DIAGNOSIS — Z1231 Encounter for screening mammogram for malignant neoplasm of breast: Secondary | ICD-10-CM

## 2021-11-19 ENCOUNTER — Encounter: Payer: Self-pay | Admitting: Urgent Care

## 2021-11-19 ENCOUNTER — Ambulatory Visit
Admission: RE | Admit: 2021-11-19 | Discharge: 2021-11-19 | Disposition: A | Discharge status: Home / Self Care | Payer: Commercial Managed Care - HMO | Source: Ambulatory Visit | Attending: Endocrinology | Admitting: Endocrinology

## 2021-11-19 DIAGNOSIS — Z1231 Encounter for screening mammogram for malignant neoplasm of breast: Secondary | ICD-10-CM

## 2021-12-24 LAB — HM COLONOSCOPY

## 2022-01-28 ENCOUNTER — Other Ambulatory Visit: Payer: Self-pay | Admitting: Geriatric Medicine

## 2022-01-28 ENCOUNTER — Other Ambulatory Visit (HOSPITAL_COMMUNITY): Payer: Self-pay | Admitting: Geriatric Medicine

## 2022-01-28 DIAGNOSIS — K529 Noninfective gastroenteritis and colitis, unspecified: Secondary | ICD-10-CM

## 2022-02-05 ENCOUNTER — Ambulatory Visit
Admission: RE | Admit: 2022-02-05 | Discharge: 2022-02-05 | Disposition: A | Discharge status: Home / Self Care | Payer: Commercial Managed Care - HMO | Source: Ambulatory Visit | Attending: Geriatric Medicine | Admitting: Geriatric Medicine

## 2022-02-05 DIAGNOSIS — K529 Noninfective gastroenteritis and colitis, unspecified: Secondary | ICD-10-CM | POA: Diagnosis present

## 2022-05-30 ENCOUNTER — Other Ambulatory Visit: Payer: Self-pay

## 2022-05-30 ENCOUNTER — Emergency Department (HOSPITAL_COMMUNITY): Payer: Commercial Managed Care - HMO

## 2022-05-30 ENCOUNTER — Observation Stay (HOSPITAL_COMMUNITY)
Admission: EM | Admit: 2022-05-30 | Discharge: 2022-06-01 | Disposition: A | Discharge status: Home / Self Care | Payer: Commercial Managed Care - HMO | Attending: Emergency Medicine | Admitting: Emergency Medicine

## 2022-05-30 ENCOUNTER — Encounter (HOSPITAL_COMMUNITY): Payer: Self-pay

## 2022-05-30 DIAGNOSIS — I129 Hypertensive chronic kidney disease with stage 1 through stage 4 chronic kidney disease, or unspecified chronic kidney disease: Secondary | ICD-10-CM | POA: Insufficient documentation

## 2022-05-30 DIAGNOSIS — R197 Diarrhea, unspecified: Secondary | ICD-10-CM | POA: Insufficient documentation

## 2022-05-30 DIAGNOSIS — Z79899 Other long term (current) drug therapy: Secondary | ICD-10-CM | POA: Diagnosis not present

## 2022-05-30 DIAGNOSIS — Z7982 Long term (current) use of aspirin: Secondary | ICD-10-CM | POA: Diagnosis not present

## 2022-05-30 DIAGNOSIS — N3000 Acute cystitis without hematuria: Secondary | ICD-10-CM | POA: Diagnosis not present

## 2022-05-30 DIAGNOSIS — U071 COVID-19: Secondary | ICD-10-CM | POA: Diagnosis not present

## 2022-05-30 DIAGNOSIS — E782 Mixed hyperlipidemia: Secondary | ICD-10-CM | POA: Diagnosis not present

## 2022-05-30 DIAGNOSIS — E039 Hypothyroidism, unspecified: Secondary | ICD-10-CM | POA: Diagnosis not present

## 2022-05-30 DIAGNOSIS — D509 Iron deficiency anemia, unspecified: Secondary | ICD-10-CM | POA: Insufficient documentation

## 2022-05-30 DIAGNOSIS — R112 Nausea with vomiting, unspecified: Principal | ICD-10-CM | POA: Diagnosis present

## 2022-05-30 DIAGNOSIS — N390 Urinary tract infection, site not specified: Secondary | ICD-10-CM | POA: Insufficient documentation

## 2022-05-30 DIAGNOSIS — K922 Gastrointestinal hemorrhage, unspecified: Secondary | ICD-10-CM | POA: Diagnosis not present

## 2022-05-30 DIAGNOSIS — N183 Chronic kidney disease, stage 3 unspecified: Secondary | ICD-10-CM | POA: Diagnosis present

## 2022-05-30 DIAGNOSIS — A0839 Other viral enteritis: Secondary | ICD-10-CM | POA: Insufficient documentation

## 2022-05-30 DIAGNOSIS — K529 Noninfective gastroenteritis and colitis, unspecified: Secondary | ICD-10-CM | POA: Diagnosis not present

## 2022-05-30 DIAGNOSIS — F17210 Nicotine dependence, cigarettes, uncomplicated: Secondary | ICD-10-CM | POA: Insufficient documentation

## 2022-05-30 DIAGNOSIS — R748 Abnormal levels of other serum enzymes: Secondary | ICD-10-CM | POA: Insufficient documentation

## 2022-05-30 DIAGNOSIS — E860 Dehydration: Secondary | ICD-10-CM | POA: Insufficient documentation

## 2022-05-30 DIAGNOSIS — R1030 Lower abdominal pain, unspecified: Secondary | ICD-10-CM

## 2022-05-30 DIAGNOSIS — R109 Unspecified abdominal pain: Secondary | ICD-10-CM | POA: Insufficient documentation

## 2022-05-30 DIAGNOSIS — R1084 Generalized abdominal pain: Secondary | ICD-10-CM

## 2022-05-30 DIAGNOSIS — N1832 Chronic kidney disease, stage 3b: Secondary | ICD-10-CM | POA: Diagnosis not present

## 2022-05-30 DIAGNOSIS — I10 Essential (primary) hypertension: Secondary | ICD-10-CM | POA: Diagnosis present

## 2022-05-30 HISTORY — DX: Nausea with vomiting, unspecified: R11.2

## 2022-05-30 LAB — RESP PANEL BY RT-PCR (RSV, FLU A&B, COVID)  RVPGX2
Influenza A by PCR: NEGATIVE
Influenza B by PCR: NEGATIVE
Resp Syncytial Virus by PCR: NEGATIVE
SARS Coronavirus 2 by RT PCR: POSITIVE — AB

## 2022-05-30 LAB — URINALYSIS, ROUTINE W REFLEX MICROSCOPIC
Bacteria, UA: NONE SEEN
Bilirubin Urine: NEGATIVE
Glucose, UA: NEGATIVE mg/dL
Hgb urine dipstick: NEGATIVE
Ketones, ur: NEGATIVE mg/dL
Leukocytes,Ua: NEGATIVE
Nitrite: NEGATIVE
Protein, ur: 100 mg/dL — AB
Specific Gravity, Urine: >1.046 — ABNORMAL HIGH (ref 1.005–1.030)
pH: 6 (ref 5.0–8.0)

## 2022-05-30 LAB — CBC WITH DIFFERENTIAL/PLATELET
Abs Immature Granulocytes: 0.07 10*3/uL (ref 0.00–0.07)
Basophils Absolute: 0 10*3/uL (ref 0.0–0.1)
Basophils Relative: 0 %
Eosinophils Absolute: 0.1 10*3/uL (ref 0.0–0.5)
Eosinophils Relative: 1 %
HCT: 40.9 % (ref 36.0–46.0)
Hemoglobin: 12.8 g/dL (ref 12.0–15.0)
Immature Granulocytes: 1 %
Lymphocytes Relative: 9 %
Lymphs Abs: 1 10*3/uL (ref 0.7–4.0)
MCH: 29.6 pg (ref 26.0–34.0)
MCHC: 31.3 g/dL (ref 30.0–36.0)
MCV: 94.7 fL (ref 80.0–100.0)
Monocytes Absolute: 0.9 10*3/uL (ref 0.1–1.0)
Monocytes Relative: 8 %
Neutro Abs: 9.4 10*3/uL — ABNORMAL HIGH (ref 1.7–7.7)
Neutrophils Relative %: 81 %
Platelets: 380 10*3/uL (ref 150–400)
RBC: 4.32 MIL/uL (ref 3.87–5.11)
RDW: 17.4 % — ABNORMAL HIGH (ref 11.5–15.5)
WBC: 11.5 10*3/uL — ABNORMAL HIGH (ref 4.0–10.5)
nRBC: 0 % (ref 0.0–0.2)

## 2022-05-30 LAB — COMPREHENSIVE METABOLIC PANEL
ALT: 11 U/L (ref 0–44)
AST: 18 U/L (ref 15–41)
Albumin: 3.7 g/dL (ref 3.5–5.0)
Alkaline Phosphatase: 91 U/L (ref 38–126)
Anion gap: 12 (ref 5–15)
BUN: 14 mg/dL (ref 6–20)
CO2: 23 mmol/L (ref 22–32)
Calcium: 9.8 mg/dL (ref 8.9–10.3)
Chloride: 104 mmol/L (ref 98–111)
Creatinine, Ser: 1.51 mg/dL — ABNORMAL HIGH (ref 0.44–1.00)
GFR, Estimated: 40 mL/min — ABNORMAL LOW (ref >60)
Glucose, Bld: 111 mg/dL — ABNORMAL HIGH (ref 70–99)
Potassium: 3.7 mmol/L (ref 3.5–5.1)
Sodium: 139 mmol/L (ref 135–145)
Total Bilirubin: 0.4 mg/dL (ref 0.3–1.2)
Total Protein: 6.9 g/dL (ref 6.5–8.1)

## 2022-05-30 LAB — POC OCCULT BLOOD, ED: Fecal Occult Bld: POSITIVE — AB

## 2022-05-30 LAB — LIPASE, BLOOD: Lipase: 138 U/L — ABNORMAL HIGH (ref 11–51)

## 2022-05-30 MED ORDER — LACTATED RINGERS IV BOLUS: 1000.0000 mL | Freq: Once | INTRAVENOUS | Status: DC

## 2022-05-30 MED ORDER — PIPERACILLIN-TAZOBACTAM 3.375 G IVPB 30 MIN
3.3750 g | Freq: Once | INTRAVENOUS | Status: DC
  Filled 2022-05-30: qty 50

## 2022-05-30 MED ORDER — SODIUM CHLORIDE 0.9 % IV SOLN
1.0000 g | Freq: Once | INTRAVENOUS | Status: AC
  Administered 2022-05-30: 1 g via INTRAVENOUS
  Filled 2022-05-30: qty 10

## 2022-05-30 MED ORDER — DIPHENHYDRAMINE HCL 50 MG/ML IJ SOLN
12.5000 mg | Freq: Once | INTRAMUSCULAR | Status: AC
  Administered 2022-05-30: 12.5 mg via INTRAVENOUS
  Filled 2022-05-30: qty 1

## 2022-05-30 MED ORDER — DIPHENHYDRAMINE HCL 25 MG PO CAPS: 25.0000 mg | ORAL_CAPSULE | Freq: Once | ORAL | Status: DC

## 2022-05-30 MED ORDER — MORPHINE SULFATE (PF) 2 MG/ML IV SOLN
2.0000 mg | INTRAVENOUS | Status: DC | PRN
  Administered 2022-05-30 – 2022-06-01 (×5): 2 mg via INTRAVENOUS
  Filled 2022-05-30 (×5): qty 1

## 2022-05-30 MED ORDER — LEVOTHYROXINE SODIUM 50 MCG PO TABS
50.0000 ug | ORAL_TABLET | Freq: Every day | ORAL | Status: DC
  Administered 2022-05-31 – 2022-06-01 (×2): 50 ug via ORAL
  Filled 2022-05-30 (×2): qty 1

## 2022-05-30 MED ORDER — LISINOPRIL 10 MG PO TABS
10.0000 mg | ORAL_TABLET | Freq: Every day | ORAL | Status: DC
  Administered 2022-05-31 – 2022-06-01 (×2): 10 mg via ORAL
  Filled 2022-05-30 (×2): qty 1

## 2022-05-30 MED ORDER — IOHEXOL 350 MG/ML SOLN
75.0000 mL | Freq: Once | INTRAVENOUS | Status: AC | PRN
  Administered 2022-05-30: 75 mL via INTRAVENOUS

## 2022-05-30 MED ORDER — METOCLOPRAMIDE HCL 5 MG/ML IJ SOLN
10.0000 mg | Freq: Once | INTRAMUSCULAR | Status: AC
  Administered 2022-05-30: 10 mg via INTRAVENOUS
  Filled 2022-05-30: qty 2

## 2022-05-30 MED ORDER — ACETAMINOPHEN 325 MG PO TABS: 650.0000 mg | ORAL_TABLET | Freq: Four times a day (QID) | ORAL | Status: DC | PRN

## 2022-05-30 MED ORDER — PANTOPRAZOLE SODIUM 40 MG IV SOLR: 40.0000 mg | Freq: Two times a day (BID) | INTRAVENOUS | Status: DC

## 2022-05-30 MED ORDER — HALOPERIDOL LACTATE 5 MG/ML IJ SOLN
5.0000 mg | Freq: Once | INTRAMUSCULAR | Status: AC
  Administered 2022-05-30: 5 mg via INTRAVENOUS
  Filled 2022-05-30: qty 1

## 2022-05-30 MED ORDER — LACTATED RINGERS IV BOLUS
1500.0000 mL | Freq: Once | INTRAVENOUS | Status: AC
  Administered 2022-05-30: 1500 mL via INTRAVENOUS

## 2022-05-30 MED ORDER — SODIUM CHLORIDE 0.9 % IV SOLN
1.0000 g | INTRAVENOUS | Status: DC
  Administered 2022-05-31 – 2022-06-01 (×2): 1 g via INTRAVENOUS
  Filled 2022-05-30 (×2): qty 10

## 2022-05-30 MED ORDER — SODIUM CHLORIDE 0.9 % IV SOLN: INTRAVENOUS | Status: DC

## 2022-05-30 MED ORDER — ROSUVASTATIN CALCIUM 5 MG PO TABS
5.0000 mg | ORAL_TABLET | Freq: Every evening | ORAL | Status: DC
  Administered 2022-05-30 – 2022-05-31 (×2): 5 mg via ORAL
  Filled 2022-05-30 (×2): qty 1

## 2022-05-30 MED ORDER — MORPHINE SULFATE (PF) 4 MG/ML IV SOLN
4.0000 mg | Freq: Once | INTRAVENOUS | Status: AC
  Administered 2022-05-30: 4 mg via INTRAVENOUS
  Filled 2022-05-30: qty 1

## 2022-05-30 MED ORDER — PANTOPRAZOLE SODIUM 40 MG IV SOLR
40.0000 mg | Freq: Once | INTRAVENOUS | Status: AC
  Administered 2022-05-30: 40 mg via INTRAVENOUS
  Filled 2022-05-30: qty 10

## 2022-05-30 MED ORDER — ONDANSETRON HCL 4 MG/2ML IJ SOLN
4.0000 mg | Freq: Four times a day (QID) | INTRAMUSCULAR | Status: DC | PRN
  Administered 2022-06-01: 4 mg via INTRAVENOUS
  Filled 2022-05-30: qty 2

## 2022-05-30 MED ORDER — ACETAMINOPHEN 650 MG RE SUPP: 650.0000 mg | Freq: Four times a day (QID) | RECTAL | Status: DC | PRN

## 2022-05-30 MED ORDER — FERROUS SULFATE 325 (65 FE) MG PO TABS
325.0000 mg | ORAL_TABLET | ORAL | Status: DC
  Administered 2022-05-31: 325 mg via ORAL
  Filled 2022-05-30 (×2): qty 1

## 2022-05-30 MED ORDER — DROPERIDOL 2.5 MG/ML IJ SOLN: 2.5000 mg | Freq: Once | INTRAMUSCULAR | Status: DC

## 2022-05-30 NOTE — ED Provider Notes (Signed)
Assume Care - Medical Decision Making  Care of patient assumed from previous emergency medicine provider. See their note for further details of history, physical exam and plan.  Briefly, [name] is a 61 y.o. female who presents with:  H/o chronic abd px- uncertain etiology, has had gallbladder/appy/hysterectomy, h/o divertic, has had some darker stools but on new vitamin recently H/w n/v/d few weeks, happens a lot Tender epigastric Looks dehydrated WBC 11k Thinking about pancreatitis, divertic, dehydration '[ ]'$  fu CT abd/pelvis, reassess after medication admin '[ ]'$  if all nl, reasonable to start on PPI   Reassessment:  I personally reassessed the patient: Vital Signs:  The most current vitals were  Vitals:   05/30/22 1830 05/30/22 1900  BP: (!) 180/76 (!) 170/80  Pulse: 95 (!) 101  Resp: 18 19  Temp:    SpO2: 99% 100%   Hemodynamics:  The patient is hemodynamically stable. Mental Status:  The patient is alert   Additional MDM/ED Course: I personally reevaluated the patient, she is very uncomfortable, on exam she is diffusely tender abdomen but significantly tender in the left lower quadrant.  She has an elevated lipase, although no signs of pancreatitis on CT scan.  I wonder if this is refractory to all of her vomiting and intolerance of p.o. over the past couple of days.  She has elevated creatinine, appears to be at baseline.  CT scan did show some perinephric enhancement bilaterally, although she is not having fevers and her urinalysis does not show any signs of urinary tract infection.  Do not believe she is having pyelonephritis.  White blood cell count is slightly elevated but appears to be at baseline.  Her main problem is that she is intolerant to p.o.  She was given Reglan, Benadryl, and morphine here earlier today.  She still continues to be in significant pain and intolerant of p.o.  She has not had anything over the past 3 days.  Additionally CT scan shows signs of  chronic diverticulitis.  Believe she needs admission for antibiotics for concern for pyelo and IV antibiotics and she is intolerant of p.o.  Will start her on Rocephin and admit her to the hospital.  Additionally, because of continued pain, will give her 5 mg of IV haloperidol after obtaining ECG to evaluate for QTc prolongation.    Phyllis Ginger, MD 05/30/22 1610    Blanchie Dessert, MD 05/31/22 [id number]

## 2022-05-30 NOTE — ED Notes (Signed)
This RN present at bedside as ED MD performs rectal exam to obtain occult blood card. Patient tolerated exam well.

## 2022-05-30 NOTE — ED Provider Triage Note (Signed)
Emergency Medicine Provider Triage Evaluation Note  [name] , a 61 y.o. female  was evaluated in triage.  Pt complains of emesis and diarrhea onset 3 weeks.  Has associated nausea.  Has a history of diverticulitis.  Notes her symptoms have worsened over the past 3 days.  Denies any urinary symptoms.  Patient is followed by her GI doctor at [place].  Review of Systems  Positive:  Negative:   Physical Exam  BP 133/78 (BP Location: Right Arm)   Pulse (!) 112   Temp 98.4 F (36.9 C) (Oral)   Resp 15   Ht '4\' 10"'$  (1.473 m)   Wt 40.8 kg   SpO2 99%   BMI 18.81 kg/m  Gen:   Awake, no distress   Resp:  Normal effort MSK:   Moves extremities without difficulty  Other:  Diffuse abdominal tenderness to palpation  Medical Decision Making  Medically screening exam initiated at 12:54 PM.  Appropriate orders placed.  Margy Clarks was informed that the remainder of the evaluation will be completed by another provider, this initial triage assessment does not replace that evaluation, and the importance of remaining in the ED until their evaluation is complete.    Tyreque Finken A, PA-C 05/30/22 1255

## 2022-05-30 NOTE — H&P (Signed)
History and Physical    Patient: [name] EYC:[id number] DOB: [birth year] DOA: 05/30/2022 DOS: the patient was seen and examined on 05/30/2022 PCP: [facility] [place]  Patient coming from: Home  Chief Complaint:  Chief Complaint  Patient presents with   Emesis   Diarrhea   HPI: [name] is a 61 y.o. female with medical history significant of hypertension, hyperlipidemia, hypothyroidism, diverticulitis who presented to the emergency department today via EMS from home due to 1 month of diarrhea which was watery in nature, diarrhea was about 5-6 episodes daily.  She tried Imodium without any improvement in the diarrhea.  She also complained of lower abdominal pain which comes up only when she has to urinate, pain was sharp in nature and it was rated as 10/10 on pain scale.  Patient also complained of nausea and vomiting, the vomiting is nonbloody and is now just dry eaves since she has not eating or drink anything since the last 3 to 4 days. Abdominal pain worsened within the last 3 days and patient decided to go to the ED for further evaluation and management.  She denies fever, chest pain, shortness of breath, headache, blurry vision.   ED Course:  In the emergency department, patient was tachycardic, BP was 166/137, other vital signs were within normal range.  Workup in the ED showed normal CBC except for WBC of 11.5, BMP was normal except for blood glucose of 111 and creatinine of 1.51 (baseline creatinine at 1.4-1.5).  Urinalysis was normal, FOBT was positive.  Influenza A, B, RSV was negative, SARS coronavirus was positive. CT abdomen and pelvis with contrast showed: 1. Heterogeneous enhancement of bilateral kidneys concerning for pyelonephritis. Recommend correlation with urinalysis. 2. Persistent focal circumferential bowel wall prominence of the sigmoid colon at the confluence with the descending colon. This likely reflects a degree of chronic underlying diverticulitis.  No evidence of acute diverticulitis. Recommend correlation with colon cancer screening history. 3. Severe age advanced atherosclerotic calcifications throughout the nonaneurysmal abdominal aorta. Patient was treated with IV ceftriaxone, Protonix, Reglan, allopurinol, Benadryl, morphine.  Hospitalist was asked to admit patient for further evaluation and management.   Review of Systems: Review of systems as noted in the HPI. All other systems reviewed and are negative.   Past Medical History:  Diagnosis Date   Acute diverticulitis 07/16/2013   Acute renal failure ([place]) 07/16/2013   Arthritis    hands, back, knees   Colitis 07/16/2013   Diverticulitis    Elevated CEA 08/15/2017   GERD (gastroesophageal reflux disease)    GIB (gastrointestinal bleeding) 01/07/2012   Hypertension    somewhat controlled, taking medication   Hypothyroidism    Intractable nausea and vomiting 05/30/2022   Lesion of epiglottis    Macrocytosis without anemia 08/15/2017   Orthostasis 08/22/2017   Past Surgical History:  Procedure Laterality Date   ABDOMINAL HYSTERECTOMY     APPENDECTOMY     CHOLECYSTECTOMY     COLONOSCOPY  01/09/2012   Procedure: COLONOSCOPY;  Surgeon: Lear Ng, MD;  Location: [facility];  Service: Endoscopy;  Laterality: N/A;   COLONOSCOPY WITH PROPOFOL N/A 08/25/2017   Procedure: COLONOSCOPY WITH PROPOFOL;  Surgeon: Jonathon Bellows, MD;  Location[facility] ENDOSCOPY;  Service: Gastroenterology;  Laterality: N/A;   ESOPHAGOGASTRODUODENOSCOPY  01/07/2012   Procedure: ESOPHAGOGASTRODUODENOSCOPY (EGD);  Surgeon: Winfield Cunas., MD;  Location: [facility] ENDOSCOPY;  Service: Endoscopy;  Laterality: N/A;   ESOPHAGOGASTRODUODENOSCOPY (EGD) WITH PROPOFOL N/A 08/25/2017   Procedure: ESOPHAGOGASTRODUODENOSCOPY (EGD) WITH PROPOFOL;  Surgeon: Jonathon Bellows, MD;  Location[facility] ENDOSCOPY;  Service: Gastroenterology;  Laterality: N/A;   VIDEO BRONCHOSCOPY Bilateral 05/14/2016   Procedure: VIDEO BRONCHOSCOPY WITHOUT  FLUORO;  Surgeon: Collene Gobble, MD;  Location: [facility];  Service: Cardiopulmonary;  Laterality: Bilateral;    Social History:  reports that she has been smoking cigarettes. She has a 84.00 pack-year smoking history. She has never used smokeless tobacco. She reports current alcohol use of about 35.0 standard drinks of alcohol per week. She reports that she does not use drugs.   No Known Allergies  Family History  Problem Relation Age of Onset   Breast cancer Mother        multiple types of cancer   Stomach cancer Mother    Throat cancer Mother    Melanoma Mother    Kidney disease Father      Prior to Admission medications   Medication Sig Start Date End Date Taking? Authorizing Provider  albuterol (VENTOLIN HFA) 108 (90 Base) MCG/ACT inhaler Inhale 2 puffs into the lungs every 6 (six) hours as needed for wheezing or shortness of breath. 06/26/19  Yes [provider]  aspirin EC 81 MG tablet Take 81 mg by mouth daily.   Yes [provider]  Biotin 5000 MCG TABS Take 500 mg by mouth daily.   Yes [provider]  Cholecalciferol (VITAMIN D-3) 5000 UNITS TABS Take 10,000 Units by mouth daily after breakfast.   Yes [provider]  cyclobenzaprine (FLEXERIL) 10 MG tablet Take 10 mg by mouth 2 (two) times daily as needed for muscle spasms.   Yes [provider]  levothyroxine (SYNTHROID, LEVOTHROID) 50 MCG tablet Take 50 mcg by mouth daily before breakfast.   Yes [provider]  lisinopril (ZESTRIL) 10 MG tablet Take 10 mg by mouth daily. 07/15/19  Yes [provider]  rosuvastatin (CRESTOR) 5 MG tablet Take 1 tablet (5 mg total) by mouth every evening. For cholesterol. 09/17/19  Yes Pleas Koch, NP  BREO ELLIPTA 200-25 MCG/INH AEPB TAKE 1 PUFF BY MOUTH EVERY DAY Patient not taking: Reported on 05/30/2022 02/25/20   Pleas Koch, NP  diclofenac Sodium (VOLTAREN) 1 % GEL Apply 2 g topically 3 (three) times daily with  meals as needed (pain). Patient not taking: Reported on 05/30/2022 09/24/19   [provider]  hydrocortisone (ANUSOL-HC) 2.5 % rectal cream Place 1 application rectally 2 (two) times daily. Patient not taking: Reported on 05/30/2022 03/13/20   Lin Landsman, MD  sucralfate (CARAFATE) 1 g tablet Take 1 tablet (1 g total) by mouth 4 (four) times daily for 5 days. Patient not taking: Reported on 05/30/2022 10/10/21 05/30/22  Lucrezia Starch, MD  traZODone (DESYREL) 50 MG tablet TAKE 1 TABLET (50 MG TOTAL) BY MOUTH AT BEDTIME AS NEEDED FOR SLEEP. Patient not taking: Reported on 05/30/2022 04/15/20   Pleas Koch, NP    Physical Exam: BP (!) 166/72 (BP Location: Right Arm)   Pulse 96   Temp 98.2 F (36.8 C) (Oral)   Resp 18   Ht '4\' 10"'$  (1.473 m)   Wt 40.8 kg   SpO2 100%   BMI 18.81 kg/m   General: 61 y.o. year-old female well developed well nourished in no acute distress.  Alert and oriented x3. HEENT: NCAT, EOMI, dry mucous membrane Neck: Supple, trachea medial Cardiovascular: Regular rate and rhythm with no rubs or gallops.  No thyromegaly or JVD noted.  No lower extremity edema. 2/4 pulses in  all 4 extremities. Respiratory: Clear to auscultation with no wheezes or rales. Good inspiratory effort. Abdomen: Soft, nontender nondistended with normal bowel sounds x4 quadrants. Muskuloskeletal: No cyanosis, clubbing or edema noted bilaterally Neuro: CN II-XII intact, strength 5/5 x 4, sensation, reflexes intact Skin: No ulcerative lesions noted or rashes Psychiatry: Judgement and insight appear normal. Mood is appropriate for condition and setting          Labs on Admission:  Basic Metabolic Panel: Recent Labs  Lab 05/30/22 1255  NA 139  K 3.7  CL 104  CO2 23  GLUCOSE 111*  BUN 14  CREATININE 1.51*  CALCIUM 9.8   Liver Function Tests: Recent Labs  Lab 05/30/22 1255  AST 18  ALT 11  ALKPHOS 91  BILITOT 0.4  PROT 6.9  ALBUMIN 3.7   Recent Labs  Lab  05/30/22 1255  LIPASE 138*   No results for input(s): "AMMONIA" in the last 168 hours. CBC: Recent Labs  Lab 05/30/22 1255  WBC 11.5*  NEUTROABS 9.4*  HGB 12.8  HCT 40.9  MCV 94.7  PLT 380   Cardiac Enzymes: No results for input(s): "CKTOTAL", "CKMB", "CKMBINDEX", "TROPONINI" in the last 168 hours.  BNP (last 3 results) No results for input(s): "BNP" in the last 8760 hours.  ProBNP (last 3 results) No results for input(s): "PROBNP" in the last 8760 hours.  CBG: No results for input(s): "GLUCAP" in the last 168 hours.  Radiological Exams on Admission: CT ABDOMEN PELVIS W CONTRAST  Result Date: 05/30/2022 CLINICAL DATA:  Bilateral EXAM: CT ABDOMEN AND PELVIS WITH CONTRAST TECHNIQUE: Multidetector CT imaging of the abdomen and pelvis was performed using the standard protocol following bolus administration of intravenous contrast. RADIATION DOSE REDUCTION: This exam was performed according to the departmental dose-optimization program which includes automated exposure control, adjustment of the mA and/or kV according to patient size and/or use of iterative reconstruction technique. CONTRAST:  68m OMNIPAQUE IOHEXOL 350 MG/ML SOLN COMPARISON:  Oct 10 2021 FINDINGS: Lower chest: No acute abnormality. Hepatobiliary: Status post cholecystectomy. Unchanged prominence of the biliary system, most consistent with post cholecystectomy state. Benign hepatic cyst along the falciform ligament. Pancreas: Atrophic appearance of the pancreas with similar pancreatic ductal prominence measuring approximately 5 mm. Spleen: Normal in size without focal abnormality. Adrenals/Urinary Tract: Adrenal glands are unremarkable. There is heterogeneous enhancement of bilateral kidneys. No hydronephrosis. No obstructing nephrolithiasis. Bladder with mild subjective wall prominence for degree of distension. Stomach/Bowel: No evidence of bowel obstruction. Diverticulosis without evidence of acute diverticulitis.  Persistent focal circumferential bowel wall prominence of the sigmoid colon at the confluence with the descending colon. Status post appendectomy. Stomach is unremarkable. Vascular/Lymphatic: Severe age advanced atherosclerotic calcifications throughout the nonaneurysmal abdominal aorta. No new suspicious lymphadenopathy. Reproductive: Status post hysterectomy. No adnexal masses. Other: No free air or free fluid. Musculoskeletal: Degenerative changes of the lumbar spine. IMPRESSION: 1. Heterogeneous enhancement of bilateral kidneys concerning for pyelonephritis. Recommend correlation with urinalysis. 2. Persistent focal circumferential bowel wall prominence of the sigmoid colon at the confluence with the descending colon. This likely reflects a degree of chronic underlying diverticulitis. No evidence of acute diverticulitis. Recommend correlation with colon cancer screening history. 3. Severe age advanced atherosclerotic calcifications throughout the nonaneurysmal abdominal aorta. Aortic Atherosclerosis (ICD10-I70.0). Electronically Signed   By: SValentino SaxonM.D.   On: 05/30/2022 17:08    EKG: I independently viewed the EKG done and my findings are as followed: Normal sinus rhythm at a rate of 95 bpm  Assessment/Plan Present  on Admission:  Intractable nausea and vomiting  GI bleed  Stage 3 chronic kidney disease (HCC)  Essential hypertension  Principal Problem:   Intractable nausea and vomiting Active Problems:   Essential hypertension   GI bleed   Stage 3 chronic kidney disease (HCC)   Abdominal pain   UTI (urinary tract infection)   Acute diarrhea   Gastroenteritis due to COVID-19 virus   Dehydration   Iron deficiency anemia   Elevated lipase   Mixed hyperlipidemia  Intractable nausea and vomiting  Continue IV Zofran p.r.n. Continue IV hydration Patient will be started on clear liquid diet in the morning with plan to advance diet as tolerated  Abdominal pain possibly due to  presumed UTI POA Patient complained of suprapubic pain which only occurs when she urinates 2 urinalysis was normal. Continue IV ceftriaxone Continue IV morphine 2 mg q.3h p.r.n. for moderate to severe pain  Acute diarrhea Last bowel movement was this morning It is uncertain at this time if patient diarrhea was related to patient's positive COVID-19 virus infection C. difficile and GI stool panel will be obtained if patient continues to have diarrhea  Gastroenteritis secondary to COVID-19 virus infection Patient complained of not eating or drinking within the last 3 to 4 days Patient's above symptoms may be related to her COVID-19 virus status Patient has no respiratory symptoms Continue symptomatic treatment  GI bleed FOBT was positive H/H= 12.8/40.9, this was 14.7/41.7 on 10/10/2021 Hemoccult was positive Continue IV Protonix  Gastroenterologist consult was placed and currently awaiting callback  Dehydration Continue IV hydration  Iron deficiency anemia Continue ferrous sulfate  Elevated lipase level Lipase level was 138  CKD 3B Creatinine 1.51 (baseline creatinine at 1.4-1.5) Renally adjust medications, avoid nephrotoxic agents/dehydration/hypotension  Essential hypertension Continue lisinopril  Mixed hyperlipidemia Continue Crestor  Acquired hypothyroidism Continue Synthroid   DVT prophylaxis: SCDs.  (No chemoprophylaxis due to GI bleed)  Code Status: Full code  Family Communication: None at bedside  Consults: None  Severity of Illness: The appropriate patient status for this patient is INPATIENT. Inpatient status is judged to be reasonable and necessary in order to provide the required intensity of service to ensure the patient's safety. The patient's presenting symptoms, physical exam findings, and initial radiographic and laboratory data in the context of their chronic comorbidities is felt to place them at high risk for further clinical deterioration.  Furthermore, it is not anticipated that the patient will be medically stable for discharge from the hospital within 2 midnights of admission.   * I certify that at the point of admission it is my clinical judgment that the patient will require inpatient hospital care spanning beyond 2 midnights from the point of admission due to high intensity of service, high risk for further deterioration and high frequency of surveillance required.*  Author: Bernadette Hoit, DO 05/30/2022 10:46 PM  For on call review [email or web address].

## 2022-05-30 NOTE — ED Provider Notes (Signed)
[facility] EMERGENCY DEPARTMENT Provider Note   CSN: [id number] Arrival date & time: 05/30/22  1245     History  Chief Complaint  Patient presents with   Emesis   Diarrhea    [name] is a 61 y.o. female.   Emesis Associated symptoms: diarrhea   Diarrhea Associated symptoms: vomiting    Patient is a 61 year old female with past medical history significant for diverticulitis, colitis, AKI, GI bleed, hypertension  She has had her gallbladder and appendix removed she has had hysterectomy.  She follows with a gastroenterologist at [place]  She was presented to the emergency room today with nausea vomiting and diarrhea for the past several weeks she states over the past few days she has had worsening abdominal pain seems to be primarily epigastric.  No fevers, decreased urine output but no dysuria or urgency.       Home Medications Prior to Admission medications   Medication Sig Start Date End Date Taking? Authorizing Provider  albuterol (VENTOLIN HFA) 108 (90 Base) MCG/ACT inhaler TAKE 2 PUFFS BY MOUTH EVERY 6 HOURS AS NEEDED FOR WHEEZE 06/26/19   [provider]  aspirin EC 81 MG tablet Take 81 mg by mouth daily.    [provider]  Biotin 5000 MCG TABS Take by mouth daily.    [provider]  [place] Patter [id number] MCG/INH AEPB TAKE 1 PUFF BY MOUTH EVERY DAY 02/25/20   Pleas Koch, NP  Cholecalciferol (VITAMIN D-3) 5000 UNITS TABS Take 10,000 Units by mouth daily after breakfast.    [provider]  Cyanocobalamin (B-12) 5000 MCG CAPS Take by mouth daily. Patient not taking: Reported on 03/13/2020    [provider]  diclofenac Sodium (VOLTAREN) 1 % GEL APPLY TO THUMB TO 3 TIMES A DAY AS NEEDED FOR PAIN. 09/24/19   [provider]  gabapentin (NEURONTIN) 300 MG capsule Take 300-600 mg by mouth at bedtime. 12/27/19   [provider]  hydrocortisone (ANUSOL-HC) 2.5 % rectal  cream Place 1 application rectally 2 (two) times daily. 03/13/20   Lin Landsman, MD  levothyroxine (SYNTHROID, LEVOTHROID) 50 MCG tablet Take 50 mcg by mouth daily before breakfast.    [provider]  lisinopril (ZESTRIL) 10 MG tablet Take 10 mg by mouth daily. 07/15/19   [provider]  Multiple Vitamin (MULTIVITAMIN) capsule Take by mouth.    [provider]  ondansetron (ZOFRAN-ODT) 8 MG disintegrating tablet SMARTSIG:1 Tablet(s) By Mouth Every 12 Hours PRN 09/29/19   [provider]  pantoprazole (PROTONIX) 40 MG tablet Take 1 tablet (40 mg total) by mouth daily. 10/10/21 11/09/21  Lucrezia Starch, MD  Probiotic Product (PROBIOTIC-10 PO) Take by mouth daily.    [provider]  rosuvastatin (CRESTOR) 5 MG tablet Take 1 tablet (5 mg total) by mouth every evening. For cholesterol. 09/17/19   Pleas Koch, NP  sucralfate (CARAFATE) 1 g tablet Take 1 tablet (1 g total) by mouth 4 (four) times daily for 5 days. 10/10/21 10/15/21  Lucrezia Starch, MD  traZODone (DESYREL) 50 MG tablet TAKE 1 TABLET (50 MG TOTAL) BY MOUTH AT BEDTIME AS NEEDED FOR SLEEP. 04/15/20   Pleas Koch, NP  Zinc 25 MG TABS Take 25 mg by mouth.    [provider]      Allergies    Patient has no known allergies.    Review of Systems   Review of Systems  Gastrointestinal:  Positive for  diarrhea and vomiting.    Physical Exam Updated Vital Signs BP (!) 155/93   Pulse 94   Temp 98.1 F (36.7 C)   Resp 18   Ht '4\' 10"'$  (1.473 m)   Wt 40.8 kg   SpO2 99%   BMI 18.81 kg/m  Physical Exam Vitals and nursing note reviewed.  Constitutional:      General: She is not in acute distress.    Comments: Somewhat unkempt 61 year old female appears quite uncomfortable.  HENT:     Head: Normocephalic and atraumatic.     Nose: Nose normal.     Mouth/Throat:     Mouth: Mucous membranes are dry.  Eyes:     General: No scleral icterus. Cardiovascular:     Rate and  Rhythm: Normal rate and regular rhythm.     Pulses: Normal pulses.     Heart sounds: Normal heart sounds.  Pulmonary:     Effort: Pulmonary effort is normal. No respiratory distress.     Breath sounds: No wheezing.  Abdominal:     Palpations: Abdomen is soft.     Tenderness: There is abdominal tenderness.     Comments: Epigastric umbilical tenderness.  No rebound tenderness.  Musculoskeletal:     Cervical back: Normal range of motion.     Right lower leg: No edema.     Left lower leg: No edema.  Skin:    General: Skin is warm and dry.     Capillary Refill: Capillary refill takes less than 2 seconds.  Neurological:     Mental Status: She is alert. Mental status is at baseline.  Psychiatric:        Mood and Affect: Mood normal.        Behavior: Behavior normal.     ED Results / Procedures / Treatments   Labs (all labs ordered are listed, but only abnormal results are displayed) Labs Reviewed  CBC WITH DIFFERENTIAL/PLATELET - Abnormal; Notable for the following components:      Result Value   WBC 11.5 (*)    RDW 17.4 (*)    Neutro Abs 9.4 (*)    All other components within normal limits  COMPREHENSIVE METABOLIC PANEL - Abnormal; Notable for the following components:   Glucose, Bld 111 (*)    Creatinine, Ser 1.51 (*)    GFR, Estimated 40 (*)    All other components within normal limits  LIPASE, BLOOD - Abnormal; Notable for the following components:   Lipase 138 (*)    All other components within normal limits  URINALYSIS, ROUTINE W REFLEX MICROSCOPIC  MAGNESIUM    EKG None  Radiology No results found.  Procedures Procedures    Medications Ordered in ED Medications  metoCLOPramide (REGLAN) injection 10 mg (10 mg Intravenous Given 05/30/22 1618)  morphine (PF) 4 MG/ML injection 4 mg (4 mg Intravenous Given 05/30/22 1611)  lactated ringers bolus 1,500 mL (1,500 mLs Intravenous New Bag/Given 05/30/22 1613)  pantoprazole (PROTONIX) injection 40 mg (40 mg  Intravenous Given 05/30/22 1622)  diphenhydrAMINE (BENADRYL) injection 12.5 mg (12.5 mg Intravenous Given 05/30/22 1619)    ED Course/ Medical Decision Making/ A&P Clinical Course as of 05/30/22 1625  Sun May 30, 2022  1444 Sx began Wednesday.  [WF]    Clinical Course User Index [WF] Tedd Sias, PA                           Medical Decision Making Amount and/or Complexity  of Data Reviewed Radiology: ordered.  Risk Prescription drug management.    This patient presents to the ED for concern of abdominal pain, this involves a number of treatment options, and is a complaint that carries with it a moderate to high risk of complications and morbidity. A differential diagnosis was considered for the patient's symptoms which is discussed below:   The causes of generalized abdominal pain include but are not limited to AAA, mesenteric ischemia, appendicitis, diverticulitis, DKA, gastritis, gastroenteritis, AMI, nephrolithiasis, pancreatitis, peritonitis, adrenal insufficiency,lead poisoning, iron toxicity, intestinal ischemia, constipation, UTI,SBO/LBO, splenic rupture, biliary disease, IBD, IBS, PUD, or hepatitis.   Co morbidities: Discussed in HPI   Brief History:  Patient is a 61 year old female with past medical history significant for diverticulitis, colitis, AKI, GI bleed, hypertension  She has had her gallbladder and appendix removed she has had hysterectomy.  She follows with a gastroenterologist at [facility]  She was presented to the emergency room today with nausea vomiting and diarrhea for the past several weeks she states over the past few days she has had worsening abdominal pain seems to be primarily epigastric.  No fevers, decreased urine output but no dysuria or urgency.     EMR reviewed including pt PMHx, past surgical history and past visits to ER.   See HPI for more details   Lab Tests:   I ordered and independently interpreted labs. Labs  notable for Leukocytosis, elevated lipase which is a new lab finding for patient, CMP with stable CKD no acute changes.  Urinalysis pending at time of shift change.  She is receiving 1.5 L of lactated Ringer's and expect that we will be able to obtain a urine sample after this.  Imaging Studies:  CT abdomen pelvis ordered.  Pending at time of handoff   Cardiac Monitoring:  NA NA   Medicines ordered:  I ordered medication including Protonix, Benadryl, Reglan, morphine, lactated Ringer's for nausea vomiting diarrhea abdominal pain and dehydration Reevaluation of the patient after these medicines showed that the patient improved I have reviewed the patients home medicines and have made adjustments as needed   Critical Interventions:     Consults/Attending Physician      Reevaluation:  After the interventions noted above I re-evaluated patient and found that they have :improved   Social Determinants of Health:      Problem List / ED Course:  Patient of nausea vomiting diarrhea abdominal pain seems to have some chronic pain but today pain seems worse and she also has an elevated lipase will obtain CT abdomen pelvis as she has no history of pancreatitis.   Dispostion:  CT abdomen pelvis and urine pending at time of shift change.  Handoff to evening team.  Final Clinical Impression(s) / ED Diagnoses Final diagnoses:  None    Rx / DC Orders ED Discharge Orders     None         Tedd Sias, [place] 05/30/22 1628    Pattricia Boss, MD 06/06/22 [phone]

## 2022-05-30 NOTE — ED Triage Notes (Signed)
Pt BIB [facility] from home c/o N/V/D x3 weeks but then the last 3 days it has gotten worse. Pt has a hx of diverticulitis.

## 2022-05-30 NOTE — ED Notes (Signed)
Patient transported to CT 

## 2022-05-30 NOTE — ED Notes (Signed)
Hospitalist at bedside 

## 2022-05-31 DIAGNOSIS — R112 Nausea with vomiting, unspecified: Secondary | ICD-10-CM | POA: Diagnosis not present

## 2022-05-31 LAB — COMPREHENSIVE METABOLIC PANEL
ALT: 9 U/L (ref 0–44)
AST: 14 U/L — ABNORMAL LOW (ref 15–41)
Albumin: 2.9 g/dL — ABNORMAL LOW (ref 3.5–5.0)
Alkaline Phosphatase: 67 U/L (ref 38–126)
Anion gap: 12 (ref 5–15)
BUN: 12 mg/dL (ref 6–20)
CO2: 21 mmol/L — ABNORMAL LOW (ref 22–32)
Calcium: 8.8 mg/dL — ABNORMAL LOW (ref 8.9–10.3)
Chloride: 106 mmol/L (ref 98–111)
Creatinine, Ser: 1.34 mg/dL — ABNORMAL HIGH (ref 0.44–1.00)
GFR, Estimated: 46 mL/min — ABNORMAL LOW (ref >60)
Glucose, Bld: 73 mg/dL (ref 70–99)
Potassium: 3.8 mmol/L (ref 3.5–5.1)
Sodium: 139 mmol/L (ref 135–145)
Total Bilirubin: 0.6 mg/dL (ref 0.3–1.2)
Total Protein: 5.4 g/dL — ABNORMAL LOW (ref 6.5–8.1)

## 2022-05-31 LAB — CBC
HCT: 32.9 % — ABNORMAL LOW (ref 36.0–46.0)
HCT: 30.3 % — ABNORMAL LOW (ref 36.0–46.0)
Hemoglobin: 10.4 g/dL — ABNORMAL LOW (ref 12.0–15.0)
Hemoglobin: 9.7 g/dL — ABNORMAL LOW (ref 12.0–15.0)
MCH: 29.9 pg (ref 26.0–34.0)
MCH: 30.2 pg (ref 26.0–34.0)
MCHC: 31.6 g/dL (ref 30.0–36.0)
MCHC: 32 g/dL (ref 30.0–36.0)
MCV: 94.5 fL (ref 80.0–100.0)
MCV: 94.4 fL (ref 80.0–100.0)
Platelets: 319 10*3/uL (ref 150–400)
Platelets: 275 10*3/uL (ref 150–400)
RBC: 3.48 MIL/uL — ABNORMAL LOW (ref 3.87–5.11)
RBC: 3.21 MIL/uL — ABNORMAL LOW (ref 3.87–5.11)
RDW: 17.8 % — ABNORMAL HIGH (ref 11.5–15.5)
RDW: 17.6 % — ABNORMAL HIGH (ref 11.5–15.5)
WBC: 10.5 10*3/uL (ref 4.0–10.5)
WBC: 10.1 10*3/uL (ref 4.0–10.5)
nRBC: 0 % (ref 0.0–0.2)
nRBC: 0 % (ref 0.0–0.2)

## 2022-05-31 LAB — MAGNESIUM
Magnesium: 1.6 mg/dL — ABNORMAL LOW (ref 1.7–2.4)
Magnesium: 1.6 mg/dL — ABNORMAL LOW (ref 1.7–2.4)

## 2022-05-31 LAB — TSH: TSH: 8.39 u[IU]/mL — ABNORMAL HIGH (ref 0.350–4.500)

## 2022-05-31 LAB — PHOSPHORUS: Phosphorus: 2.5 mg/dL (ref 2.5–4.6)

## 2022-05-31 LAB — C DIFFICILE QUICK SCREEN W PCR REFLEX
C Diff antigen: NEGATIVE
C Diff interpretation: NOT DETECTED
C Diff toxin: NEGATIVE

## 2022-05-31 LAB — OCCULT BLOOD X 1 CARD TO LAB, STOOL: Fecal Occult Bld: POSITIVE — AB

## 2022-05-31 LAB — HIV ANTIBODY (ROUTINE TESTING W REFLEX): HIV Screen 4th Generation wRfx: NONREACTIVE

## 2022-05-31 LAB — CREATININE, SERUM
Creatinine, Ser: 1.45 mg/dL — ABNORMAL HIGH (ref 0.44–1.00)
GFR, Estimated: 42 mL/min — ABNORMAL LOW (ref >60)

## 2022-05-31 MED ORDER — FLORANEX PO PACK
1.0000 g | PACK | Freq: Three times a day (TID) | ORAL | Status: DC
  Administered 2022-05-31 – 2022-06-01 (×3): 1 g via ORAL
  Filled 2022-05-31 (×5): qty 1

## 2022-05-31 MED ORDER — METOCLOPRAMIDE HCL 5 MG/ML IJ SOLN: 10.0000 mg | Freq: Three times a day (TID) | INTRAMUSCULAR | Status: DC | PRN

## 2022-05-31 MED ORDER — ALPRAZOLAM 0.25 MG PO TABS
0.5000 mg | ORAL_TABLET | Freq: Three times a day (TID) | ORAL | Status: DC | PRN
  Administered 2022-05-31: 0.5 mg via ORAL
  Filled 2022-05-31: qty 2

## 2022-05-31 MED ORDER — LACTATED RINGERS IV SOLN: INTRAVENOUS | Status: DC

## 2022-05-31 MED ORDER — MAGNESIUM SULFATE 2 GM/50ML IV SOLN
2.0000 g | Freq: Once | INTRAVENOUS | Status: AC
  Administered 2022-05-31: 2 g via INTRAVENOUS
  Filled 2022-05-31: qty 50

## 2022-05-31 NOTE — Progress Notes (Signed)
PROGRESS NOTE    Patient: [name]                            PCP: [facility]                    DOB: [birth year]            DOA: 05/30/2022 YHC:[id number]             DOS: 05/31/2022, 10:01 AM   LOS: 1 day   Date of Service: The patient was seen and examined on 05/31/2022  Subjective:   The patient was seen and examined this morning. Labs are stable, reporting improved nausea vomiting, improved diarrhea Improved abdominal pain pain with pain medication No upper respiratory symptoms, shortness of breath, cough   Brief Narrative:   [name] is a 61 y.o. female with medical history significant of hypertension, hyperlipidemia, hypothyroidism, diverticulitis who presented to the emergency department today via EMS from home due to 1 month of diarrhea which was watery in nature, diarrhea was about 5-6 episodes daily.  She tried Imodium without any improvement in the diarrhea.  She also complained of lower abdominal pain which comes up only when she has to urinate, pain was sharp in nature and it was rated as 10/10 on pain scale.  Patient also complained of nausea and vomiting, the vomiting is nonbloody and is now just dry eaves since she has not eating or drink anything since the last 3 to 4 days. Abdominal pain worsened within the last 3 days and patient decided to go to the ED for further evaluation and management.  She denies fever, chest pain, shortness of breath, headache, blurry vision.     ED Course:  Patient was tachycardic, BP was 166/137, other vital signs were within normal range.  Workup in the ED showed normal CBC except for WBC of 11.5, BMP was normal except for blood glucose of 111 and creatinine of 1.51 (baseline creatinine at 1.4-1.5).  Urinalysis was normal, FOBT was positive.  Influenza A, B, RSV was negative, SARS coronavirus was positive. CT abdomen and pelvis with contrast showed: 1. Heterogeneous enhancement of bilateral kidneys concerning for  pyelonephritis. Recommend correlation with urinalysis. 2. Persistent focal circumferential bowel wall prominence of the sigmoid colon at the confluence with the descending colon. This likely reflects a degree of chronic underlying diverticulitis. No evidence of acute diverticulitis. Recommend correlation with colon cancer screening history. 3. Severe age advanced atherosclerotic calcifications throughout the nonaneurysmal abdominal aorta. Patient was treated with IV ceftriaxone, Protonix, Reglan, allopurinol, Benadryl, morphine.  Hospitalist was asked to admit patient for further evaluation and management.     Assessment & Plan:   Principal Problem:   Intractable nausea and vomiting Active Problems:   Essential hypertension   GI bleed   Stage 3 chronic kidney disease (HCC)   Acquired hypothyroidism   Abdominal pain   UTI (urinary tract infection)   Acute diarrhea   Gastroenteritis due to COVID-19 virus   Dehydration   Iron deficiency anemia   Elevated lipase   Mixed hyperlipidemia   Intractable nausea and vomiting  -Somewhat improved, patient willing to try clear diet today -Will continue fluid hydration>> start LR - antiemetics as needed Zofran, Phenergan  -Advancing diet as tolerated    Abdominal pain possibly due to presumed UTI POA -Improved abdominal pain -Patient complained of suprapubic pain which only occurs when she urinates 2  -  Urinalysis was normal ..  -Continue empiric antibiotics,  IV ceftriaxone for now - Continue IV morphine 2 mg q.3h p.r.n. for moderate to severe pain   Acute diarrhea Last bowel movement was this morning -No further diarrhea episodes overnight  It is uncertain at this time if patient diarrhea was related to patient's positive COVID-19 virus infection C. difficile and GI stool panel will be obtained if patient continues to have diarrhea   Gastroenteritis secondary to COVID-19 virus infection -Poor oral intake x 3-4 days -May be  related to SARS-CoV-2 - Patient has no respiratory symptoms - Continue symptomatic treatment   GI bleed FOBT was positive-clear related to the diarrhea H/H= 12.8/40.9, this was 14.7/41.7 on 10/10/2021 Hemoglobin remained stable anticipating mild reduction due to hydration -Per patient she had a colonoscopy past month within normal limits with except for some particular disease Continue IV Protonix  Think possible GI evaluation   Dehydration Continue IV hydration   Iron deficiency anemia Continue ferrous sulfate   Elevated lipase level Lipase level was 138   CKD 3B Creatinine 1.51 (baseline creatinine at 1.4-1.5) Renally adjust medications, avoid nephrotoxic agents/dehydration/hypotension Lab Results  Component Value Date   CREATININE 1.34 (H) 05/31/2022   CREATININE 1.45 (H) 05/30/2022   CREATININE 1.51 (H) 05/30/2022      Essential hypertension Continue lisinopril   Mixed hyperlipidemia Continue Crestor   Acquired hypothyroidism Continue Synthroid      ------------------------------------------------------------------------------------------------------------------------------------- Nutritional status:  The patient's BMI is: Body mass index is 18.81 kg/m. I agree with the assessment and plan as outlined ----------------------------------------------------------------------------------------------------------------------------------- Cultures; Stool for C. difficile culture  ----------------------------------------------------------------------------------------------------------------------------------------  DVT prophylaxis:  SCDs Start: 05/30/22 2213   Code Status:   Code Status: Full Code  Family Communication: No family member present at bedside- attempt will be made to update daily The above findings and plan of care has been discussed with patient (and family)  in detail,  they expressed understanding and agreement of above. -Advance care planning  has been discussed.   Admission status:   Status is: Inpatient Remains inpatient appropriate because: Continue evaluation for electrolyte abnormalities, diarrhea, COVID... No need for IV fluid resuscitation,     Procedures:   No admission procedures for hospital encounter.   Antimicrobials:  Anti-infectives (From admission, onward)    Start     Dose/Rate Route Frequency Ordered Stop   05/31/22 1000  cefTRIAXone (ROCEPHIN) 1 g in sodium chloride 0.9 % 100 mL IVPB        1 g 200 mL/hr over 30 Minutes Intravenous Every 24 hours 05/30/22 2252     05/30/22 2030  cefTRIAXone (ROCEPHIN) 1 g in sodium chloride 0.9 % 100 mL IVPB        1 g 200 mL/hr over 30 Minutes Intravenous  Once 05/30/22 2026 05/30/22 2105   05/30/22 2000  piperacillin-tazobactam (ZOSYN) IVPB 3.375 g  Status:  Discontinued        3.375 g 100 mL/hr over 30 Minutes Intravenous  Once 05/30/22 1952 05/30/22 2026        Medication:   ferrous sulfate  325 mg Oral QODAY   lactobacillus  1 g Oral TID WC   levothyroxine  50 mcg Oral QAC breakfast   lisinopril  10 mg Oral Daily   rosuvastatin  5 mg Oral QPM    acetaminophen **OR** acetaminophen, ALPRAZolam, metoCLOPramide (REGLAN) injection, morphine injection, ondansetron (ZOFRAN) IV   Objective:   Vitals:   05/30/22 2212 05/30/22 2213 05/31/22 0452 05/31/22 0838  BP: Marland Kitchen)  166/72  (!) 151/71 131/66  Pulse: 96  99 (!) 105  Resp: '18  18 17  '$ Temp: 98.2 F (36.8 C)  98.3 F (36.8 C) 98 F (36.7 C)  TempSrc: Oral  Oral Oral  SpO2: 100% 96% 97% 100%  Weight:      Height:        Intake/Output Summary (Last 24 hours) at 05/31/2022 1001 Last data filed at 05/30/2022 2105 Gross per 24 hour  Intake 1600 ml  Output --  Net 1600 ml   Filed Weights   05/30/22 1251  Weight: 40.8 kg     Examination:   Physical Exam  Constitution:  Alert, cooperative, no distress,  Appears calm and comfortable  Psychiatric:   Normal and stable mood and affect, cognition  intact,   HEENT:        Normocephalic, PERRL, otherwise with in Normal limits  Chest:         Chest symmetric Cardio vascular:  S1/S2, RRR, No murmure, No Rubs or Gallops  pulmonary: Clear to auscultation bilaterally, respirations unlabored, negative wheezes / crackles Abdomen: Soft, non-tender, non-distended, bowel sounds,no masses, no organomegaly Muscular skeletal: Limited exam - in bed, able to move all 4 extremities,   Neuro: CNII-XII intact. , normal motor and sensation, reflexes intact  Extremities: No pitting edema lower extremities, +2 pulses  Skin: Dry, warm to touch, negative for any Rashes, No open wounds Wounds: per nursing documentation   ------------------------------------------------------------------------------------------------------------------------------------------    LABs:     Latest Ref Rng & Units 05/31/2022    2:25 AM 05/30/2022   10:26 PM 05/30/2022   12:55 PM  CBC  WBC 4.0 - 10.5 K/uL 10.1  10.5  11.5   Hemoglobin 12.0 - 15.0 g/dL 9.7  10.4  12.8   Hematocrit 36.0 - 46.0 % 30.3  32.9  40.9   Platelets 150 - 400 K/uL 275  319  380       Latest Ref Rng & Units 05/31/2022    2:25 AM 05/30/2022   10:26 PM 05/30/2022   12:55 PM  CMP  Glucose 70 - 99 mg/dL 73   111   BUN 6 - 20 mg/dL 12   14   Creatinine 0.44 - 1.00 mg/dL 1.34  1.45  1.51   Sodium 135 - 145 mmol/L 139   139   Potassium 3.5 - 5.1 mmol/L 3.8   3.7   Chloride 98 - 111 mmol/L 106   104   CO2 22 - 32 mmol/L 21   23   Calcium 8.9 - 10.3 mg/dL 8.8   9.8   Total Protein 6.5 - 8.1 g/dL 5.4   6.9   Total Bilirubin 0.3 - 1.2 mg/dL 0.6   0.4   Alkaline Phos 38 - 126 U/L 67   91   AST 15 - 41 U/L 14   18   ALT 0 - 44 U/L 9   11        Micro Results Recent Results (from the past 240 hour(s))  Resp panel by RT-PCR (RSV, Flu A&B, Covid) Anterior Nasal Swab     Status: Abnormal   Collection Time: 05/30/22  9:08 PM   Specimen: Anterior Nasal Swab  Result Value Ref Range Status   SARS  Coronavirus 2 by RT PCR POSITIVE (A) NEGATIVE Final    Comment: (NOTE) SARS-CoV-2 target nucleic acids are DETECTED.  The SARS-CoV-2 RNA is generally detectable in upper respiratory specimens during the acute phase of infection. Positive results  are indicative of the presence of the identified virus, but do not rule out bacterial infection or co-infection with other pathogens not detected by the test. Clinical correlation with patient history and other diagnostic information is necessary to determine patient infection status. The expected result is Negative.  Fact Sheet for Patients: EntrepreneurPulse.com.au  Fact Sheet for Healthcare Providers: IncredibleEmployment.be  This test is not yet approved or cleared by the [place] FDA and  has been authorized for detection and/or diagnosis of SARS-CoV-2 by FDA under an Emergency Use Authorization (EUA).  This EUA will remain in effect (meaning this test can be used) for the duration of  the COVID-19 declaration under Section 564(b)(1) of the A ct, 21 U.S.C. section 360bbb-3(b)(1), unless the authorization is terminated or revoked sooner.     Influenza A by PCR NEGATIVE NEGATIVE Final   Influenza B by PCR NEGATIVE NEGATIVE Final    Comment: (NOTE) The Xpert Xpress SARS-CoV-2/FLU/RSV plus assay is intended as an aid in the diagnosis of influenza from Nasopharyngeal swab specimens and should not be used as a sole basis for treatment. Nasal washings and aspirates are unacceptable for Xpert Xpress SARS-CoV-2/FLU/RSV testing.  Fact Sheet for Patients: EntrepreneurPulse.com.au  Fact Sheet for Healthcare Providers: IncredibleEmployment.be  This test is not yet approved or cleared by the [place] FDA and has been authorized for detection and/or diagnosis of SARS-CoV-2 by FDA under an Emergency Use Authorization (EUA). This EUA will remain in effect (meaning  this test can be used) for the duration of the COVID-19 declaration under Section 564(b)(1) of the Act, 21 U.S.C. section 360bbb-3(b)(1), unless the authorization is terminated or revoked.     Resp Syncytial Virus by PCR NEGATIVE NEGATIVE Final    Comment: (NOTE) Fact Sheet for Patients: EntrepreneurPulse.com.au  Fact Sheet for Healthcare Providers: IncredibleEmployment.be  This test is not yet approved or cleared by the [place] FDA and has been authorized for detection and/or diagnosis of SARS-CoV-2 by FDA under an Emergency Use Authorization (EUA). This EUA will remain in effect (meaning this test can be used) for the duration of the COVID-19 declaration under Section 564(b)(1) of the Act, 21 U.S.C. section 360bbb-3(b)(1), unless the authorization is terminated or revoked.  Performed at [facility], [place] [address] [place], [place] [address]     Radiology Reports CT ABDOMEN PELVIS W CONTRAST  Result Date: 05/30/2022 CLINICAL DATA:  Bilateral EXAM: CT ABDOMEN AND PELVIS WITH CONTRAST TECHNIQUE: Multidetector CT imaging of the abdomen and pelvis was performed using the standard protocol following bolus administration of intravenous contrast. RADIATION DOSE REDUCTION: This exam was performed according to the departmental dose-optimization program which includes automated exposure control, adjustment of the mA and/or kV according to patient size and/or use of iterative reconstruction technique. CONTRAST:  83m OMNIPAQUE IOHEXOL 350 MG/ML SOLN COMPARISON:  Oct 10 2021 FINDINGS: Lower chest: No acute abnormality. Hepatobiliary: Status post cholecystectomy. Unchanged prominence of the biliary system, most consistent with post cholecystectomy state. Benign hepatic cyst along the falciform ligament. Pancreas: Atrophic appearance of the pancreas with similar pancreatic ductal prominence measuring approximately 5 mm. Spleen: Normal in size without  focal abnormality. Adrenals/Urinary Tract: Adrenal glands are unremarkable. There is heterogeneous enhancement of bilateral kidneys. No hydronephrosis. No obstructing nephrolithiasis. Bladder with mild subjective wall prominence for degree of distension. Stomach/Bowel: No evidence of bowel obstruction. Diverticulosis without evidence of acute diverticulitis. Persistent focal circumferential bowel wall prominence of the sigmoid colon at the confluence with the descending colon. Status post appendectomy. Stomach  is unremarkable. Vascular/Lymphatic: Severe age advanced atherosclerotic calcifications throughout the nonaneurysmal abdominal aorta. No new suspicious lymphadenopathy. Reproductive: Status post hysterectomy. No adnexal masses. Other: No free air or free fluid. Musculoskeletal: Degenerative changes of the lumbar spine. IMPRESSION: 1. Heterogeneous enhancement of bilateral kidneys concerning for pyelonephritis. Recommend correlation with urinalysis. 2. Persistent focal circumferential bowel wall prominence of the sigmoid colon at the confluence with the descending colon. This likely reflects a degree of chronic underlying diverticulitis. No evidence of acute diverticulitis. Recommend correlation with colon cancer screening history. 3. Severe age advanced atherosclerotic calcifications throughout the nonaneurysmal abdominal aorta. Aortic Atherosclerosis (ICD10-I70.0). Electronically Signed   By: Valentino Saxon M.D.   On: 05/30/2022 17:08    SIGNED: Deatra James, MD, FHM. [facility],  Pager (please use [email or web address] to page/text) Please use Epic Secure Chat for non-urgent communication (7AM-7PM)  If 7PM-7AM, please contact night-coverage [email or web address], 05/31/2022, 10:01 AM

## 2022-05-31 NOTE — Hospital Course (Addendum)
[name] is a 61 y.o. female with medical history significant of hypertension, hyperlipidemia, hypothyroidism, diverticulitis who presented to the emergency department today via EMS from home due to 1 month of diarrhea which was watery in nature, diarrhea was about 5-6 episodes daily.  She tried Imodium without any improvement in the diarrhea.  She also complained of lower abdominal pain which comes up only when she has to urinate, pain was sharp in nature and it was rated as 10/10 on pain scale.  Patient also complained of nausea and vomiting, the vomiting is nonbloody and is now just dry eaves since she has not eating or drink anything since the last 3 to 4 days. Abdominal pain worsened within the last 3 days and patient decided to go to the ED for further evaluation and management.  She denies fever, chest pain, shortness of breath, headache, blurry vision.     ED Course:  Patient was tachycardic, BP was 166/137, other vital signs were within normal range.  Workup in the ED showed normal CBC except for WBC of 11.5, BMP was normal except for blood glucose of 111 and creatinine of 1.51 (baseline creatinine at 1.4-1.5).  Urinalysis was normal, FOBT was positive.  Influenza A, B, RSV was negative, SARS coronavirus was positive. CT abdomen and pelvis with contrast showed: 1. Heterogeneous enhancement of bilateral kidneys concerning for pyelonephritis. Recommend correlation with urinalysis. 2. Persistent focal circumferential bowel wall prominence of the sigmoid colon at the confluence with the descending colon. This likely reflects a degree of chronic underlying diverticulitis. No evidence of acute diverticulitis. Recommend correlation with colon cancer screening history. 3. Severe age advanced atherosclerotic calcifications throughout the nonaneurysmal abdominal aorta. Patient was treated with IV ceftriaxone, Protonix, Reglan, allopurinol, Benadryl, morphine.  Hospitalist was asked to admit patient  for further evaluation and management.     Assessment & Plan:   Principal Problem:   Intractable nausea and vomiting Active Problems:   Essential hypertension   GI bleed   Stage 3 chronic kidney disease (HCC)   Acquired hypothyroidism   Abdominal pain   UTI (urinary tract infection)   Acute diarrhea   Gastroenteritis due to COVID-19 virus   Dehydration   Iron deficiency anemia   Elevated lipase   Mixed hyperlipidemia   Intractable nausea and vomiting  -Somewhat improved, patient willing to try clear diet today -Will continue fluid hydration>> start LR - antiemetics as needed Zofran, Phenergan  -Advancing diet as tolerated    Abdominal pain possibly due to presumed UTI POA -Improved abdominal pain -Patient complained of suprapubic pain which only occurs when she urinates 2  -  Urinalysis was normal ..  -Continue empiric antibiotics,  IV ceftriaxone for now - Continue IV morphine 2 mg q.3h p.r.n. for moderate to severe pain   Acute diarrhea Last bowel movement was this morning -No further diarrhea episodes overnight  It is uncertain at this time if patient diarrhea was related to patient's positive COVID-19 virus infection C. difficile and GI stool panel will be obtained if patient continues to have diarrhea   Gastroenteritis secondary to COVID-19 virus infection -Poor oral intake x 3-4 days -May be related to SARS-CoV-2 - Patient has no respiratory symptoms - Continue symptomatic treatment   GI bleed FOBT was positive-clear related to the diarrhea H/H= 12.8/40.9, this was 14.7/41.7 on 10/10/2021 Hemoglobin remained stable anticipating mild reduction due to hydration -Per patient she had a colonoscopy past month within normal limits with except for some particular disease Continue  IV Protonix  Think possible GI evaluation   Dehydration Continue IV hydration   Iron deficiency anemia Continue ferrous sulfate   Elevated lipase level Lipase level was 138    CKD 3B Creatinine 1.51 (baseline creatinine at 1.4-1.5) Renally adjust medications, avoid nephrotoxic agents/dehydration/hypotension Lab Results  Component Value Date   CREATININE 1.34 (H) 05/31/2022   CREATININE 1.45 (H) 05/30/2022   CREATININE 1.51 (H) 05/30/2022      Essential hypertension Continue lisinopril   Mixed hyperlipidemia Continue Crestor   Acquired hypothyroidism Continue Synthroid

## 2022-06-01 DIAGNOSIS — R112 Nausea with vomiting, unspecified: Secondary | ICD-10-CM | POA: Diagnosis not present

## 2022-06-01 LAB — GASTROINTESTINAL PANEL BY PCR, STOOL (REPLACES STOOL CULTURE)

## 2022-06-01 LAB — COMPREHENSIVE METABOLIC PANEL
ALT: 8 U/L (ref 0–44)
AST: 15 U/L (ref 15–41)
Albumin: 2.4 g/dL — ABNORMAL LOW (ref 3.5–5.0)
Alkaline Phosphatase: 68 U/L (ref 38–126)
Anion gap: 8 (ref 5–15)
BUN: 14 mg/dL (ref 6–20)
CO2: 25 mmol/L (ref 22–32)
Calcium: 8.4 mg/dL — ABNORMAL LOW (ref 8.9–10.3)
Chloride: 106 mmol/L (ref 98–111)
Creatinine, Ser: 1.06 mg/dL — ABNORMAL HIGH (ref 0.44–1.00)
GFR, Estimated: >60 mL/min (ref >60)
Glucose, Bld: 108 mg/dL — ABNORMAL HIGH (ref 70–99)
Potassium: 3.6 mmol/L (ref 3.5–5.1)
Sodium: 139 mmol/L (ref 135–145)
Total Bilirubin: 0.4 mg/dL (ref 0.3–1.2)
Total Protein: 4.7 g/dL — ABNORMAL LOW (ref 6.5–8.1)

## 2022-06-01 LAB — CBC
HCT: 29.3 % — ABNORMAL LOW (ref 36.0–46.0)
Hemoglobin: 9 g/dL — ABNORMAL LOW (ref 12.0–15.0)
MCH: 29.6 pg (ref 26.0–34.0)
MCHC: 30.7 g/dL (ref 30.0–36.0)
MCV: 96.4 fL (ref 80.0–100.0)
Platelets: 268 10*3/uL (ref 150–400)
RBC: 3.04 MIL/uL — ABNORMAL LOW (ref 3.87–5.11)
RDW: 17.5 % — ABNORMAL HIGH (ref 11.5–15.5)
WBC: 6.4 10*3/uL (ref 4.0–10.5)
nRBC: 0 % (ref 0.0–0.2)

## 2022-06-01 LAB — LIPASE, BLOOD: Lipase: 124 U/L — ABNORMAL HIGH (ref 11–51)

## 2022-06-01 MED ORDER — PANTOPRAZOLE SODIUM 40 MG PO TBEC: 40.0000 mg | DELAYED_RELEASE_TABLET | Freq: Two times a day (BID) | ORAL | 0 refills | Status: DC

## 2022-06-01 MED ORDER — FLORANEX PO PACK: 1.0000 g | PACK | Freq: Three times a day (TID) | ORAL | 0 refills | Status: AC

## 2022-06-01 MED ORDER — FERROUS SULFATE 325 (65 FE) MG PO TABS: 325.0000 mg | ORAL_TABLET | ORAL | 1 refills | Status: DC

## 2022-06-01 MED ORDER — ONDANSETRON 4 MG PO TBDP: 4.0000 mg | ORAL_TABLET | Freq: Three times a day (TID) | ORAL | 0 refills | Status: AC | PRN

## 2022-06-01 NOTE — Discharge Summary (Signed)
Physician Discharge Summary   Patient: [name] MRN: [id number] DOB: 04-10-1961  Admit date:     05/30/2022  Discharge date: 06/01/22  Discharge Physician: Deatra James   PCP: [facility]   Recommendations at discharge:  - Follow-up with a gastroenterologist within 1-2 weeks -Recommending monitoring blood count, CBC within 1 week -Avoid aspirin, aspirin products till seen by gastroenterologist - Follow-up with PCP within 1-4 weeks  Discharge Diagnoses: Principal Problem:   Intractable nausea and vomiting Active Problems:   Essential hypertension   GI bleed   Stage 3 chronic kidney disease ([place])   Acquired hypothyroidism   Abdominal pain   UTI (urinary tract infection)   Acute diarrhea   Gastroenteritis due to COVID-19 virus   Dehydration   Iron deficiency anemia   Elevated lipase   Mixed hyperlipidemia  Resolved Problems:   * No resolved hospital problems. *  Hospital Course: [name] is a 61 y.o. female with medical history significant of hypertension, hyperlipidemia, hypothyroidism, diverticulitis who presented to the emergency department today via EMS from home due to 1 month of diarrhea which was watery in nature, diarrhea was about 5-6 episodes daily.  She tried Imodium without any improvement in the diarrhea.  She also complained of lower abdominal pain which comes up only when she has to urinate, pain was sharp in nature and it was rated as 10/10 on pain scale.  Patient also complained of nausea and vomiting, the vomiting is nonbloody and is now just dry eaves since she has not eating or drink anything since the last 3 to 4 days. Abdominal pain worsened within the last 3 days and patient decided to go to the ED for further evaluation and management.  She denies fever, chest pain, shortness of breath, headache, blurry vision.     ED Course:  Patient was tachycardic, BP was 166/137, other vital signs were within normal range.  Workup in the  ED showed normal CBC except for WBC of 11.5, BMP was normal except for blood glucose of 111 and creatinine of 1.51 (baseline creatinine at 1.4-1.5).  Urinalysis was normal, FOBT was positive.  Influenza A, B, RSV was negative, SARS coronavirus was positive. CT abdomen and pelvis with contrast showed: 1. Heterogeneous enhancement of bilateral kidneys concerning for pyelonephritis. Recommend correlation with urinalysis. 2. Persistent focal circumferential bowel wall prominence of the sigmoid colon at the confluence with the descending colon. This likely reflects a degree of chronic underlying diverticulitis. No evidence of acute diverticulitis. Recommend correlation with colon cancer screening history. 3. Severe age advanced atherosclerotic calcifications throughout the nonaneurysmal abdominal aorta. Patient was treated with IV ceftriaxone, Protonix, Reglan, allopurinol, Benadryl, morphine.  Hospitalist was asked to admit patient for further evaluation and management.     Intractable nausea and vomiting  -Resolved, tolerating clear -Will continue fluid hydration>> start LR>>.  Discontinued - antiemetics as needed Zofran, Phenergan      Abdominal pain possibly due to presumed UTI POA -Resolved -Patient complained of suprapubic pain which only occurs when she urinates 2  -  Urinalysis was normal ..  -Continue empiric antibiotics,  IV ceftriaxone for now - Continue IV morphine 2 mg q.3h p.r.n. for moderate to severe pain   Acute diarrhea -Resolved - Last bowel movement was this morning  -Solid stool reported -No further diarrhea episodes   It is uncertain at this time if patient diarrhea was related to patient's positive COVID-19 virus infection    Gastroenteritis secondary to COVID-19 virus  infection -Poor oral intake x 3-4 days -May be related to SARS-CoV-2 - Patient has no respiratory symptoms - Continue symptomatic treatment   GI bleed FOBT was positive-clear related to the  diarrhea-gastritis H/H= 12.8/40.9, this was 14.7/41.7 on 10/10/2021 Hemoglobin remained stable anticipating mild reduction due to hydration -Per patient she had a colonoscopy past month within normal limits with except for some particular disease Continue IV Protonix --switch to p.o. -Patient reports she had a colonoscopy with [facility] last month -diverticular disease was discovered -She is advised to follow-up with the same gastroenterology group for possible upper endoscopy, and monitor her blood count closely   Dehydration Status post IV fluid hydration -Tolerating p.o. -Resolved   Iron deficiency anemia Continue ferrous sulfate   Elevated lipase level Lipase level was 138   CKD 3B Creatinine 1.51 (baseline creatinine at 1.4-1.5) Renally adjust medications, avoid nephrotoxic agents/dehydration/hypotension Lab Results  Component Value Date   CREATININE 1.06 (H) 06/01/2022   CREATININE 1.34 (H) 05/31/2022   CREATININE 1.45 (H) 05/30/2022      Essential hypertension Continue lisinopril   Mixed hyperlipidemia Continue Crestor   Acquired hypothyroidism Continue Synthroid   Disposition: Home Diet recommendation:  Discharge Diet Orders (From admission, onward)     Start     Ordered   06/01/22 0000  Diet - low sodium heart healthy        06/01/22 1031           Cardiac diet DISCHARGE MEDICATION: Allergies as of 06/01/2022   No Known Allergies      Medication List     STOP taking these medications    aspirin EC 81 MG tablet   cyclobenzaprine 10 MG tablet Commonly known as: FLEXERIL   traZODone 50 MG tablet Commonly known as: DESYREL       TAKE these medications    albuterol 108 (90 Base) MCG/ACT inhaler Commonly known as: VENTOLIN HFA Inhale 2 puffs into the lungs every 6 (six) hours as needed for wheezing or shortness of breath.   Biotin 5000 MCG Tabs Take 500 mg by mouth daily.   ferrous sulfate 325 (65 FE) MG tablet Take 1  tablet (325 mg total) by mouth every other day. Start taking on: June 02, 2022   lactobacillus Pack Take 1 packet (1 g total) by mouth 3 (three) times daily with meals for 10 days.   levothyroxine 50 MCG tablet Commonly known as: SYNTHROID Take 50 mcg by mouth daily before breakfast.   lisinopril 10 MG tablet Commonly known as: ZESTRIL Take 10 mg by mouth daily.   ondansetron 4 MG disintegrating tablet Commonly known as: ZOFRAN-ODT Take 1 tablet (4 mg total) by mouth every 8 (eight) hours as needed for up to 15 days for nausea or vomiting.   pantoprazole 40 MG tablet Commonly known as: Protonix Take 1 tablet (40 mg total) by mouth 2 (two) times daily for 10 days.   rosuvastatin 5 MG tablet Commonly known as: Crestor Take 1 tablet (5 mg total) by mouth every evening. For cholesterol.   Vitamin D-3 125 MCG (5000 UT) Tabs Take 10,000 Units by mouth daily after breakfast.        Discharge Exam: Filed Weights   05/30/22 1251  Weight: 40.8 kg      Physical Exam:   General:  AAO x 3,  cooperative, no distress;   HEENT:  Normocephalic, PERRL, otherwise with in Normal limits   Neuro:  CNII-XII intact. , normal motor and sensation, reflexes  intact   Lungs:   Clear to auscultation BL, Respirations unlabored,  No wheezes / crackles  Cardio:    S1/S2, RRR, No murmure, No Rubs or Gallops   Abdomen:  Soft, non-tender, bowel sounds active all four quadrants, no guarding or peritoneal signs.  Muscular  skeletal:  Limited exam -global generalized weaknesses - in bed, able to move all 4 extremities,   2+ pulses,  symmetric, No pitting edema  Skin:  Dry, warm to touch, negative for any Rashes,  Wounds: Please see nursing documentation          Condition at discharge: good  The results of significant diagnostics from this hospitalization (including imaging, microbiology, ancillary and laboratory) are listed below for reference.   Imaging Studies: CT ABDOMEN PELVIS W  CONTRAST  Result Date: 05/30/2022 CLINICAL DATA:  Bilateral EXAM: CT ABDOMEN AND PELVIS WITH CONTRAST TECHNIQUE: Multidetector CT imaging of the abdomen and pelvis was performed using the standard protocol following bolus administration of intravenous contrast. RADIATION DOSE REDUCTION: This exam was performed according to the departmental dose-optimization program which includes automated exposure control, adjustment of the mA and/or kV according to patient size and/or use of iterative reconstruction technique. CONTRAST:  42m OMNIPAQUE IOHEXOL 350 MG/ML SOLN COMPARISON:  Oct 10 2021 FINDINGS: Lower chest: No acute abnormality. Hepatobiliary: Status post cholecystectomy. Unchanged prominence of the biliary system, most consistent with post cholecystectomy state. Benign hepatic cyst along the falciform ligament. Pancreas: Atrophic appearance of the pancreas with similar pancreatic ductal prominence measuring approximately 5 mm. Spleen: Normal in size without focal abnormality. Adrenals/Urinary Tract: Adrenal glands are unremarkable. There is heterogeneous enhancement of bilateral kidneys. No hydronephrosis. No obstructing nephrolithiasis. Bladder with mild subjective wall prominence for degree of distension. Stomach/Bowel: No evidence of bowel obstruction. Diverticulosis without evidence of acute diverticulitis. Persistent focal circumferential bowel wall prominence of the sigmoid colon at the confluence with the descending colon. Status post appendectomy. Stomach is unremarkable. Vascular/Lymphatic: Severe age advanced atherosclerotic calcifications throughout the nonaneurysmal abdominal aorta. No new suspicious lymphadenopathy. Reproductive: Status post hysterectomy. No adnexal masses. Other: No free air or free fluid. Musculoskeletal: Degenerative changes of the lumbar spine. IMPRESSION: 1. Heterogeneous enhancement of bilateral kidneys concerning for pyelonephritis. Recommend correlation with urinalysis. 2.  Persistent focal circumferential bowel wall prominence of the sigmoid colon at the confluence with the descending colon. This likely reflects a degree of chronic underlying diverticulitis. No evidence of acute diverticulitis. Recommend correlation with colon cancer screening history. 3. Severe age advanced atherosclerotic calcifications throughout the nonaneurysmal abdominal aorta. Aortic Atherosclerosis (ICD10-I70.0). Electronically Signed   By: SValentino SaxonM.D.   On: 05/30/2022 17:08    Microbiology: Results for orders placed or performed during the hospital encounter of 05/30/22  Resp panel by RT-PCR (RSV, Flu A&B, Covid) Anterior Nasal Swab     Status: Abnormal   Collection Time: 05/30/22  9:08 PM   Specimen: Anterior Nasal Swab  Result Value Ref Range Status   SARS Coronavirus 2 by RT PCR POSITIVE (A) NEGATIVE Final    Comment: (NOTE) SARS-CoV-2 target nucleic acids are DETECTED.  The SARS-CoV-2 RNA is generally detectable in upper respiratory specimens during the acute phase of infection. Positive results are indicative of the presence of the identified virus, but do not rule out bacterial infection or co-infection with other pathogens not detected by the test. Clinical correlation with patient history and other diagnostic information is necessary to determine patient infection status. The expected result is Negative.  Fact Sheet for Patients: hEntrepreneurPulse.com.au  Fact Sheet for Healthcare Providers: IncredibleEmployment.be  This test is not yet approved or cleared by the [place] FDA and  has been authorized for detection and/or diagnosis of SARS-CoV-2 by FDA under an Emergency Use Authorization (EUA).  This EUA will remain in effect (meaning this test can be used) for the duration of  the COVID-19 declaration under Section 564(b)(1) of the A ct, 21 U.S.C. section 360bbb-3(b)(1), unless the authorization is terminated or  revoked sooner.     Influenza A by PCR NEGATIVE NEGATIVE Final   Influenza B by PCR NEGATIVE NEGATIVE Final    Comment: (NOTE) The Xpert Xpress SARS-CoV-2/FLU/RSV plus assay is intended as an aid in the diagnosis of influenza from Nasopharyngeal swab specimens and should not be used as a sole basis for treatment. Nasal washings and aspirates are unacceptable for Xpert Xpress SARS-CoV-2/FLU/RSV testing.  Fact Sheet for Patients: EntrepreneurPulse.com.au  Fact Sheet for Healthcare Providers: IncredibleEmployment.be  This test is not yet approved or cleared by the [place] FDA and has been authorized for detection and/or diagnosis of SARS-CoV-2 by FDA under an Emergency Use Authorization (EUA). This EUA will remain in effect (meaning this test can be used) for the duration of the COVID-19 declaration under Section 564(b)(1) of the Act, 21 U.S.C. section 360bbb-3(b)(1), unless the authorization is terminated or revoked.     Resp Syncytial Virus by PCR NEGATIVE NEGATIVE Final    Comment: (NOTE) Fact Sheet for Patients: EntrepreneurPulse.com.au  Fact Sheet for Healthcare Providers: IncredibleEmployment.be  This test is not yet approved or cleared by the [place] FDA and has been authorized for detection and/or diagnosis of SARS-CoV-2 by FDA under an Emergency Use Authorization (EUA). This EUA will remain in effect (meaning this test can be used) for the duration of the COVID-19 declaration under Section 564(b)(1) of the Act, 21 U.S.C. section 360bbb-3(b)(1), unless the authorization is terminated or revoked.  Performed at [facility], [place] [address] [place], [place] [address]   C Difficile Quick Screen w PCR reflex     Status: None   Collection Time: 05/31/22  8:47 AM   Specimen: Stool  Result Value Ref Range Status   C Diff antigen NEGATIVE NEGATIVE Final   C Diff toxin NEGATIVE  NEGATIVE Final   C Diff interpretation No C. difficile detected.  Final    Comment: Performed at [facility], [place] [address], [place], [place] [address]  Gastrointestinal Panel by PCR , Stool     Status: None   Collection Time: 05/31/22  8:47 AM   Specimen: Stool  Result Value Ref Range Status   Campylobacter species NOT DETECTED NOT DETECTED Final   Plesimonas shigelloides NOT DETECTED NOT DETECTED Final   Salmonella species NOT DETECTED NOT DETECTED Final   Yersinia enterocolitica NOT DETECTED NOT DETECTED Final   Vibrio species NOT DETECTED NOT DETECTED Final   Vibrio cholerae NOT DETECTED NOT DETECTED Final   Enteroaggregative E coli (EAEC) NOT DETECTED NOT DETECTED Final   Enteropathogenic E coli (EPEC) NOT DETECTED NOT DETECTED Final   Enterotoxigenic E coli (ETEC) NOT DETECTED NOT DETECTED Final   Shiga like toxin producing E coli (STEC) NOT DETECTED NOT DETECTED Final   Shigella/Enteroinvasive E coli (EIEC) NOT DETECTED NOT DETECTED Final   Cryptosporidium NOT DETECTED NOT DETECTED Final   Cyclospora cayetanensis NOT DETECTED NOT DETECTED Final   Entamoeba histolytica NOT DETECTED NOT DETECTED Final   Giardia lamblia NOT DETECTED NOT DETECTED Final   Adenovirus F40/41 NOT  DETECTED NOT DETECTED Final   Astrovirus NOT DETECTED NOT DETECTED Final   Norovirus GI/GII NOT DETECTED NOT DETECTED Final   Rotavirus A NOT DETECTED NOT DETECTED Final   Sapovirus (I, II, IV, and V) NOT DETECTED NOT DETECTED Final    Comment: Performed at [facility], [address] [place], [place] [address]    Labs: CBC: Recent Labs  Lab 05/30/22 1255 05/30/22 2226 05/31/22 0225 06/01/22 0255  WBC 11.5* 10.5 10.1 6.4  NEUTROABS 9.4*  --   --   --   HGB 12.8 10.4* 9.7* 9.0*  HCT 40.9 32.9* 30.3* 29.3*  MCV 94.7 94.5 94.4 96.4  PLT 380 319 275 309   Basic Metabolic Panel: Recent Labs  Lab 05/30/22 1255 05/30/22 2226 05/31/22 0225 06/01/22 0255  NA 139  --  139 139  K  3.7  --  3.8 3.6  CL 104  --  106 106  CO2 23  --  21* 25  GLUCOSE 111*  --  73 108*  BUN 14  --  12 14  CREATININE 1.51* 1.45* 1.34* 1.06*  CALCIUM 9.8  --  8.8* 8.4*  MG  --  1.6* 1.6*  --   PHOS  --   --  2.5  --    Liver Function Tests: Recent Labs  Lab 05/30/22 1255 05/31/22 0225 06/01/22 0255  AST 18 14* 15  ALT '11 9 8  '$ ALKPHOS 91 67 68  BILITOT 0.4 0.6 0.4  PROT 6.9 5.4* 4.7*  ALBUMIN 3.7 2.9* 2.4*   CBG: No results for input(s): "GLUCAP" in the last 168 hours.  Discharge time spent: greater than 30 minutes.  Signed: Deatra James, MD [facility] 06/01/2022

## 2022-06-01 NOTE — Evaluation (Signed)
Physical Therapy Evaluation and Discharge Patient Details Name: LILIYA FULLENWIDER MRN: [id number] DOB: 05-24-1961 Today's Date: 06/01/2022  History of Present Illness  Pt is 61 yo female who presents on 05/30/22 with 1 mo h/o diarrhea and lower abdominal pain. Now with N & V. Found to be covid +. CT suspicious for pyelonephritis. PMH: HTN, HLD, hypothyroid, diverticulitis.  Clinical Impression  Patient evaluated by Physical Therapy with no further acute PT needs identified. All education has been completed and the patient has no further questions. Pt feeling much better, moving around independently in room. Had pt ambulate 5 mins and SPO2 remained in upper 90's. HR 110's during ambulation, returned to 91 bpm once seated. Discussed smoking cessation and activity level upon return home. No further needs. Pt does relay concern that she has been on liquid diet for whole stay and hasn;t received solid food before leaving. RN notified of this concern.  See below for any follow-up Physical Therapy or equipment needs. PT is signing off. Thank you for this referral.        Recommendations for follow up therapy are one component of a multi-disciplinary discharge planning process, led by the attending physician.  Recommendations may be updated based on patient status, additional functional criteria and insurance authorization.  Follow Up Recommendations No PT follow up      Assistance Recommended at Discharge PRN  Patient can return home with the following       Equipment Recommendations None recommended by PT  Recommendations for Other Services       Functional Status Assessment Patient has not had a recent decline in their functional status     Precautions / Restrictions Precautions Precautions: Other (comment) Precaution Comments: O2 sats Restrictions Weight Bearing Restrictions: No      Mobility  Bed Mobility Overal bed mobility: Independent                  Transfers Overall  transfer level: Independent Equipment used: None                    Ambulation/Gait Ambulation/Gait assistance: Independent Gait Distance (Feet): 100 Feet Assistive device: None Gait Pattern/deviations: WFL(Within Functional Limits) Gait velocity: WFL Gait velocity interpretation: >4.37 ft/sec, indicative of normal walking speed   General Gait Details: pt made many laps around room. SPO2 remained in upper 90's, HR in 110's with ambulation, decreased to 90's once seated  Stairs            Wheelchair Mobility    Modified Rankin (Stroke Patients Only)       Balance Overall balance assessment: Modified Independent                                           Pertinent Vitals/Pain Pain Assessment Pain Assessment: No/denies pain    Home Living Family/patient expects to be discharged to:: Private residence Living Arrangements: Spouse/significant other Available Help at Discharge: Family;Available PRN/intermittently               Additional Comments: pt from home with boyfriend. She works at [employer]. He does furniture reupholstery from his shop at home.    Prior Function Prior Level of Function : Independent/Modified Independent;Driving;Working/employed                     Hand Dominance   Dominant Hand: Right    Extremity/Trunk  Assessment   Upper Extremity Assessment Upper Extremity Assessment: Overall WFL for tasks assessed    Lower Extremity Assessment Lower Extremity Assessment: Overall WFL for tasks assessed    Cervical / Trunk Assessment Cervical / Trunk Assessment: Normal  Communication   Communication: No difficulties  Cognition Arousal/Alertness: Awake/alert Behavior During Therapy: WFL for tasks assessed/performed Overall Cognitive Status: Within Functional Limits for tasks assessed                                          General Comments General comments (skin integrity, edema, etc.):  discussed smoking cessation as well as self monitoring and activity level for return home    Exercises     Assessment/Plan    PT Assessment Patient does not need any further PT services  PT Problem List         PT Treatment Interventions      PT Goals (Current goals can be found in the Care Plan section)  Acute Rehab PT Goals Patient Stated Goal: eat and double cheeseburger and go home PT Goal Formulation: All assessment and education complete, DC therapy    Frequency       Co-evaluation               AM-PAC PT "6 Clicks" Mobility  Outcome Measure Help needed turning from your back to your side while in a flat bed without using bedrails?: None Help needed moving from lying on your back to sitting on the side of a flat bed without using bedrails?: None Help needed moving to and from a bed to a chair (including a wheelchair)?: None Help needed standing up from a chair using your arms (e.g., wheelchair or bedside chair)?: None Help needed to walk in hospital room?: None Help needed climbing 3-5 steps with a railing? : None 6 Click Score: 24    End of Session   Activity Tolerance: Patient tolerated treatment well Patient left: in chair;with call bell/phone within reach Nurse Communication: Mobility status PT Visit Diagnosis: Unsteadiness on feet (R26.81)    Time: 1208-1223 PT Time Calculation (min) (ACUTE ONLY): 15 min   Charges:   PT Evaluation $PT Eval Low Complexity: 1 Low          Leighton Roach, PT  Acute Rehab Services Secure chat preferred Office [place] 06/01/2022, 1:06 PM

## 2022-07-13 ENCOUNTER — Other Ambulatory Visit: Payer: Self-pay | Admitting: Internal Medicine

## 2022-11-09 ENCOUNTER — Encounter: Payer: Self-pay | Admitting: Family Medicine

## 2022-11-09 ENCOUNTER — Emergency Department: Payer: Commercial Managed Care - HMO

## 2022-11-09 ENCOUNTER — Other Ambulatory Visit: Payer: Self-pay

## 2022-11-09 ENCOUNTER — Inpatient Hospital Stay
Admission: EM | Admit: 2022-11-09 | Discharge: 2022-11-14 | DRG: 871 | Disposition: A | Discharge status: Home / Self Care | Payer: Commercial Managed Care - HMO | Attending: Internal Medicine | Admitting: Internal Medicine

## 2022-11-09 DIAGNOSIS — B370 Candidal stomatitis: Secondary | ICD-10-CM | POA: Diagnosis present

## 2022-11-09 DIAGNOSIS — J441 Chronic obstructive pulmonary disease with (acute) exacerbation: Secondary | ICD-10-CM | POA: Diagnosis present

## 2022-11-09 DIAGNOSIS — K219 Gastro-esophageal reflux disease without esophagitis: Secondary | ICD-10-CM | POA: Diagnosis present

## 2022-11-09 DIAGNOSIS — R197 Diarrhea, unspecified: Secondary | ICD-10-CM | POA: Diagnosis present

## 2022-11-09 DIAGNOSIS — J13 Pneumonia due to Streptococcus pneumoniae: Secondary | ICD-10-CM | POA: Diagnosis present

## 2022-11-09 DIAGNOSIS — A419 Sepsis, unspecified organism: Secondary | ICD-10-CM | POA: Diagnosis not present

## 2022-11-09 DIAGNOSIS — E039 Hypothyroidism, unspecified: Secondary | ICD-10-CM | POA: Diagnosis present

## 2022-11-09 DIAGNOSIS — M19042 Primary osteoarthritis, left hand: Secondary | ICD-10-CM | POA: Diagnosis present

## 2022-11-09 DIAGNOSIS — E872 Acidosis, unspecified: Secondary | ICD-10-CM | POA: Diagnosis present

## 2022-11-09 DIAGNOSIS — M479 Spondylosis, unspecified: Secondary | ICD-10-CM | POA: Diagnosis present

## 2022-11-09 DIAGNOSIS — M19041 Primary osteoarthritis, right hand: Secondary | ICD-10-CM | POA: Diagnosis present

## 2022-11-09 DIAGNOSIS — J440 Chronic obstructive pulmonary disease with acute lower respiratory infection: Secondary | ICD-10-CM | POA: Diagnosis present

## 2022-11-09 DIAGNOSIS — L97929 Non-pressure chronic ulcer of unspecified part of left lower leg with unspecified severity: Secondary | ICD-10-CM | POA: Diagnosis present

## 2022-11-09 DIAGNOSIS — K529 Noninfective gastroenteritis and colitis, unspecified: Secondary | ICD-10-CM | POA: Diagnosis present

## 2022-11-09 DIAGNOSIS — J9601 Acute respiratory failure with hypoxia: Secondary | ICD-10-CM | POA: Diagnosis present

## 2022-11-09 DIAGNOSIS — F17210 Nicotine dependence, cigarettes, uncomplicated: Secondary | ICD-10-CM | POA: Diagnosis present

## 2022-11-09 DIAGNOSIS — D509 Iron deficiency anemia, unspecified: Secondary | ICD-10-CM | POA: Diagnosis present

## 2022-11-09 DIAGNOSIS — I129 Hypertensive chronic kidney disease with stage 1 through stage 4 chronic kidney disease, or unspecified chronic kidney disease: Secondary | ICD-10-CM | POA: Diagnosis present

## 2022-11-09 DIAGNOSIS — R1310 Dysphagia, unspecified: Secondary | ICD-10-CM | POA: Diagnosis present

## 2022-11-09 DIAGNOSIS — J449 Chronic obstructive pulmonary disease, unspecified: Secondary | ICD-10-CM | POA: Diagnosis present

## 2022-11-09 DIAGNOSIS — M170 Bilateral primary osteoarthritis of knee: Secondary | ICD-10-CM | POA: Diagnosis present

## 2022-11-09 DIAGNOSIS — F1990 Other psychoactive substance use, unspecified, uncomplicated: Secondary | ICD-10-CM | POA: Diagnosis present

## 2022-11-09 DIAGNOSIS — E785 Hyperlipidemia, unspecified: Secondary | ICD-10-CM | POA: Diagnosis present

## 2022-11-09 DIAGNOSIS — Z79890 Hormone replacement therapy (postmenopausal): Secondary | ICD-10-CM | POA: Diagnosis not present

## 2022-11-09 DIAGNOSIS — R6520 Severe sepsis without septic shock: Secondary | ICD-10-CM | POA: Diagnosis present

## 2022-11-09 DIAGNOSIS — L97921 Non-pressure chronic ulcer of unspecified part of left lower leg limited to breakdown of skin: Secondary | ICD-10-CM

## 2022-11-09 DIAGNOSIS — Z90710 Acquired absence of both cervix and uterus: Secondary | ICD-10-CM

## 2022-11-09 DIAGNOSIS — F1910 Other psychoactive substance abuse, uncomplicated: Secondary | ICD-10-CM | POA: Diagnosis present

## 2022-11-09 DIAGNOSIS — N179 Acute kidney failure, unspecified: Secondary | ICD-10-CM | POA: Diagnosis present

## 2022-11-09 DIAGNOSIS — F1110 Opioid abuse, uncomplicated: Secondary | ICD-10-CM | POA: Diagnosis present

## 2022-11-09 DIAGNOSIS — Z79899 Other long term (current) drug therapy: Secondary | ICD-10-CM

## 2022-11-09 DIAGNOSIS — Z9049 Acquired absence of other specified parts of digestive tract: Secondary | ICD-10-CM

## 2022-11-09 DIAGNOSIS — G8929 Other chronic pain: Secondary | ICD-10-CM | POA: Diagnosis present

## 2022-11-09 DIAGNOSIS — Z8719 Personal history of other diseases of the digestive system: Secondary | ICD-10-CM

## 2022-11-09 DIAGNOSIS — N1832 Chronic kidney disease, stage 3b: Secondary | ICD-10-CM | POA: Diagnosis present

## 2022-11-09 DIAGNOSIS — N183 Chronic kidney disease, stage 3 unspecified: Secondary | ICD-10-CM | POA: Diagnosis present

## 2022-11-09 DIAGNOSIS — Z800 Family history of malignant neoplasm of digestive organs: Secondary | ICD-10-CM

## 2022-11-09 DIAGNOSIS — D75839 Thrombocytosis, unspecified: Secondary | ICD-10-CM | POA: Diagnosis present

## 2022-11-09 DIAGNOSIS — E8809 Other disorders of plasma-protein metabolism, not elsewhere classified: Secondary | ICD-10-CM | POA: Diagnosis present

## 2022-11-09 DIAGNOSIS — J189 Pneumonia, unspecified organism: Principal | ICD-10-CM

## 2022-11-09 DIAGNOSIS — R109 Unspecified abdominal pain: Secondary | ICD-10-CM | POA: Diagnosis present

## 2022-11-09 DIAGNOSIS — Z1152 Encounter for screening for COVID-19: Secondary | ICD-10-CM

## 2022-11-09 DIAGNOSIS — Z841 Family history of disorders of kidney and ureter: Secondary | ICD-10-CM

## 2022-11-09 DIAGNOSIS — R059 Cough, unspecified: Secondary | ICD-10-CM | POA: Diagnosis not present

## 2022-11-09 LAB — APTT: aPTT: 41 s — ABNORMAL HIGH (ref 24–36)

## 2022-11-09 LAB — CBC
HCT: 30 % — ABNORMAL LOW (ref 36.0–46.0)
Hemoglobin: 9.5 g/dL — ABNORMAL LOW (ref 12.0–15.0)
MCH: 31 pg (ref 26.0–34.0)
MCHC: 31.7 g/dL (ref 30.0–36.0)
MCV: 98 fL (ref 80.0–100.0)
Platelets: 588 10*3/uL — ABNORMAL HIGH (ref 150–400)
RBC: 3.06 MIL/uL — ABNORMAL LOW (ref 3.87–5.11)
RDW: 17.5 % — ABNORMAL HIGH (ref 11.5–15.5)
WBC: 24.7 10*3/uL — ABNORMAL HIGH (ref 4.0–10.5)
nRBC: 0 % (ref 0.0–0.2)

## 2022-11-09 LAB — LACTIC ACID, PLASMA
Lactic Acid, Venous: 2.8 mmol/L (ref 0.5–1.9)
Lactic Acid, Venous: 2 mmol/L (ref 0.5–1.9)

## 2022-11-09 LAB — COMPREHENSIVE METABOLIC PANEL
ALT: 18 U/L (ref 0–44)
AST: 31 U/L (ref 15–41)
Albumin: 2.1 g/dL — ABNORMAL LOW (ref 3.5–5.0)
Alkaline Phosphatase: 141 U/L — ABNORMAL HIGH (ref 38–126)
Anion gap: 13 (ref 5–15)
BUN: 25 mg/dL — ABNORMAL HIGH (ref 6–20)
CO2: 20 mmol/L — ABNORMAL LOW (ref 22–32)
Calcium: 8.5 mg/dL — ABNORMAL LOW (ref 8.9–10.3)
Chloride: 105 mmol/L (ref 98–111)
Creatinine, Ser: 2.32 mg/dL — ABNORMAL HIGH (ref 0.44–1.00)
GFR, Estimated: 23 mL/min — ABNORMAL LOW (ref >60)
Glucose, Bld: 91 mg/dL (ref 70–99)
Potassium: 4 mmol/L (ref 3.5–5.1)
Sodium: 138 mmol/L (ref 135–145)
Total Bilirubin: 0.6 mg/dL (ref 0.3–1.2)
Total Protein: 5.8 g/dL — ABNORMAL LOW (ref 6.5–8.1)

## 2022-11-09 LAB — URINALYSIS, ROUTINE W REFLEX MICROSCOPIC
Bilirubin Urine: NEGATIVE
Glucose, UA: NEGATIVE mg/dL
Hgb urine dipstick: NEGATIVE
Ketones, ur: NEGATIVE mg/dL
Leukocytes,Ua: NEGATIVE
Nitrite: NEGATIVE
Protein, ur: NEGATIVE mg/dL
Specific Gravity, Urine: 1.006 (ref 1.005–1.030)
pH: 6 (ref 5.0–8.0)

## 2022-11-09 LAB — TYPE AND SCREEN
ABO/RH(D): O POS
Antibody Screen: NEGATIVE

## 2022-11-09 LAB — STREP PNEUMONIAE URINARY ANTIGEN: Strep Pneumo Urinary Antigen: POSITIVE — AB

## 2022-11-09 LAB — PROTIME-INR
INR: 1.2 (ref 0.8–1.2)
Prothrombin Time: 15.6 s — ABNORMAL HIGH (ref 11.4–15.2)

## 2022-11-09 LAB — RESP PANEL BY RT-PCR (RSV, FLU A&B, COVID)  RVPGX2
Influenza A by PCR: NEGATIVE
Influenza B by PCR: NEGATIVE
Resp Syncytial Virus by PCR: NEGATIVE
SARS Coronavirus 2 by RT PCR: NEGATIVE

## 2022-11-09 LAB — LIPASE, BLOOD: Lipase: 39 U/L (ref 11–51)

## 2022-11-09 MED ORDER — SODIUM CHLORIDE 0.9 % IV SOLN
500.0000 mg | INTRAVENOUS | Status: DC
  Administered 2022-11-09: 500 mg via INTRAVENOUS
  Filled 2022-11-09: qty 5

## 2022-11-09 MED ORDER — GUAIFENESIN ER 600 MG PO TB12
600.0000 mg | ORAL_TABLET | Freq: Two times a day (BID) | ORAL | Status: DC
  Administered 2022-11-09 – 2022-11-14 (×10): 600 mg via ORAL
  Filled 2022-11-09 (×10): qty 1

## 2022-11-09 MED ORDER — MORPHINE SULFATE (PF) 4 MG/ML IV SOLN
4.0000 mg | Freq: Once | INTRAVENOUS | Status: AC
  Administered 2022-11-09: 4 mg via INTRAVENOUS
  Filled 2022-11-09: qty 1

## 2022-11-09 MED ORDER — IPRATROPIUM-ALBUTEROL 0.5-2.5 (3) MG/3ML IN SOLN
3.0000 mL | RESPIRATORY_TRACT | Status: DC | PRN
  Administered 2022-11-09 – 2022-11-13 (×5): 3 mL via RESPIRATORY_TRACT
  Filled 2022-11-09 (×6): qty 3

## 2022-11-09 MED ORDER — PREDNISONE 20 MG PO TABS
40.0000 mg | ORAL_TABLET | Freq: Every day | ORAL | Status: AC
  Administered 2022-11-10 – 2022-11-13 (×4): 40 mg via ORAL
  Filled 2022-11-09 (×4): qty 2

## 2022-11-09 MED ORDER — TRAZODONE HCL 50 MG PO TABS
50.0000 mg | ORAL_TABLET | Freq: Every day | ORAL | Status: DC
  Administered 2022-11-09: 50 mg via ORAL
  Administered 2022-11-10 – 2022-11-13 (×4): 100 mg via ORAL
  Filled 2022-11-09 (×4): qty 2
  Filled 2022-11-09: qty 1

## 2022-11-09 MED ORDER — LACTATED RINGERS IV SOLN: INTRAVENOUS | Status: AC

## 2022-11-09 MED ORDER — CYCLOBENZAPRINE HCL 10 MG PO TABS
10.0000 mg | ORAL_TABLET | Freq: Two times a day (BID) | ORAL | Status: DC | PRN
  Administered 2022-11-10 – 2022-11-14 (×6): 10 mg via ORAL
  Filled 2022-11-09 (×6): qty 1

## 2022-11-09 MED ORDER — ENSURE MAX PROTEIN PO LIQD
11.0000 [oz_av] | Freq: Three times a day (TID) | ORAL | Status: DC
  Administered 2022-11-09 – 2022-11-11 (×5): 11 [oz_av] via ORAL
  Filled 2022-11-09: qty 330

## 2022-11-09 MED ORDER — ONDANSETRON HCL 4 MG/2ML IJ SOLN
4.0000 mg | Freq: Once | INTRAMUSCULAR | Status: AC
  Administered 2022-11-09: 4 mg via INTRAVENOUS
  Filled 2022-11-09: qty 2

## 2022-11-09 MED ORDER — METHYLPREDNISOLONE SODIUM SUCC 125 MG IJ SOLR
125.0000 mg | Freq: Two times a day (BID) | INTRAMUSCULAR | Status: AC
  Administered 2022-11-09 – 2022-11-10 (×2): 125 mg via INTRAVENOUS
  Filled 2022-11-09 (×2): qty 2

## 2022-11-09 MED ORDER — HYDROCODONE-ACETAMINOPHEN 5-325 MG PO TABS
1.0000 | ORAL_TABLET | Freq: Three times a day (TID) | ORAL | Status: DC | PRN
  Administered 2022-11-09: 1 via ORAL
  Administered 2022-11-10 (×2): 2 via ORAL
  Administered 2022-11-10: 1 via ORAL
  Administered 2022-11-11 – 2022-11-14 (×8): 2 via ORAL
  Filled 2022-11-09 (×4): qty 2
  Filled 2022-11-09: qty 1
  Filled 2022-11-09 (×4): qty 2
  Filled 2022-11-09: qty 1
  Filled 2022-11-09 (×2): qty 2

## 2022-11-09 MED ORDER — IPRATROPIUM-ALBUTEROL 0.5-2.5 (3) MG/3ML IN SOLN
3.0000 mL | Freq: Once | RESPIRATORY_TRACT | Status: AC
  Administered 2022-11-09: 3 mL via RESPIRATORY_TRACT
  Filled 2022-11-09: qty 3

## 2022-11-09 MED ORDER — ENOXAPARIN SODIUM 30 MG/0.3ML IJ SOSY
30.0000 mg | PREFILLED_SYRINGE | INTRAMUSCULAR | Status: DC
  Administered 2022-11-10 – 2022-11-13 (×4): 30 mg via SUBCUTANEOUS
  Filled 2022-11-09 (×4): qty 0.3

## 2022-11-09 MED ORDER — LEVOTHYROXINE SODIUM 50 MCG PO TABS
50.0000 ug | ORAL_TABLET | Freq: Every day | ORAL | Status: DC
  Administered 2022-11-10 – 2022-11-14 (×5): 50 ug via ORAL
  Filled 2022-11-09 (×5): qty 1

## 2022-11-09 MED ORDER — ADULT MULTIVITAMIN W/MINERALS CH
1.0000 | ORAL_TABLET | Freq: Every day | ORAL | Status: DC
  Administered 2022-11-09 – 2022-11-14 (×6): 1 via ORAL
  Filled 2022-11-09 (×6): qty 1

## 2022-11-09 MED ORDER — POLYETHYLENE GLYCOL 3350 17 G PO PACK: 17.0000 g | PACK | Freq: Every day | ORAL | Status: DC | PRN

## 2022-11-09 MED ORDER — SODIUM CHLORIDE 0.9 % IV SOLN
2.0000 g | INTRAVENOUS | Status: AC
  Administered 2022-11-09 – 2022-11-13 (×5): 2 g via INTRAVENOUS
  Filled 2022-11-09 (×5): qty 20

## 2022-11-09 MED ORDER — SODIUM CHLORIDE 0.9 % IV SOLN: Freq: Once | INTRAVENOUS | Status: AC

## 2022-11-09 MED ORDER — SODIUM CHLORIDE 0.9 % IV BOLUS (SEPSIS)
1000.0000 mL | Freq: Once | INTRAVENOUS | Status: AC
  Administered 2022-11-09: 1000 mL via INTRAVENOUS

## 2022-11-09 MED ORDER — ROSUVASTATIN CALCIUM 5 MG PO TABS
5.0000 mg | ORAL_TABLET | Freq: Every evening | ORAL | Status: DC
  Administered 2022-11-09 – 2022-11-13 (×4): 5 mg via ORAL
  Filled 2022-11-09 (×5): qty 1

## 2022-11-09 MED ORDER — ONDANSETRON HCL 4 MG/2ML IJ SOLN
4.0000 mg | Freq: Three times a day (TID) | INTRAMUSCULAR | Status: DC | PRN
  Administered 2022-11-10: 4 mg via INTRAVENOUS
  Filled 2022-11-09: qty 2

## 2022-11-09 MED ORDER — FERROUS SULFATE 325 (65 FE) MG PO TABS
325.0000 mg | ORAL_TABLET | ORAL | Status: DC
  Administered 2022-11-10 – 2022-11-14 (×3): 325 mg via ORAL
  Filled 2022-11-09 (×3): qty 1

## 2022-11-09 NOTE — Consult Note (Signed)
CODE SEPSIS - PHARMACY COMMUNICATION  **Broad Spectrum Antibiotics should be administered within 1 hour of Sepsis diagnosis**  Time Code Sepsis Called/Page Received: 1328  Antibiotics Ordered: ceftriaxone and azithromycin  Time of 1st antibiotic administration: 1342  Additional action taken by pharmacy: N/A  Barrie Folk ,PharmD Clinical Pharmacist  11/09/2022  1:32 PM

## 2022-11-09 NOTE — Sepsis Progress Note (Signed)
Notified bedside nurse of need to draw repeat lactic acid. 

## 2022-11-09 NOTE — ED Triage Notes (Addendum)
Pt comes with c/o cough for three weeks. Pt states it makes her almost gag. Pt states lower belly pain. Pt states diarrhea also. Pt also mentioned some rectal bleeding.

## 2022-11-09 NOTE — H&P (Addendum)
History and Physical    Patient: [name]:[id number] DOB: 1961/06/06 DOA: 11/09/2022 DOS: the patient was seen and examined on 11/09/2022 PCP: [facility]  Patient coming from: Home  Chief Complaint:  Chief Complaint  Patient presents with   Cough   HPI: [name] is a 62 y.o. female with medical history significant of HTN, CKD stage 3, IDA, HLD, hypothyroidism, polysubstance abuse, hx of GI bleed who was brought in by EMS for cough for past 3 weeks with diarrhea, lower abdominal pain.   Thinks she has COPD. Has not smoked tobacco in 4 months. Cough keeps her up non-bloody sputum. Has shortness of breath but no wheezing.  Yesterday, used a cough drop that made her sick.  No fever but experiencing chills. Has dry-heeves due to coughing.   She has not been eating well. Lost about 20 lbs. Has not been been eating for the past 6 weeks after having all her teeth pulled but 5.    Has lower abdominal pain with diarrhea, has diverticulosis. She has a "smidgen of blood" when she has diarrhea. The day before yesterday, sat on the commode for "15 hours." Has seen GI at [facility].   Vitals in ED show patient tachycardic (HR 10-113), hypotensive (85/67-80/52) and intermittently tachypneic satting 92% on room air.   She has an elevated temperature of 99 F.  Code sepsis called. Pt given Duoneb, Morphine, Zofran, and 1.5 L fluid bolus and started on 150 mL/hr m IVF. WBC returned with leukocytosis   Lipase normal; CMP significant for mild acidosis bicarb 20, uremia BUN 25, AKI creatinine 2.2, hypoalbuminemia albumin 2.1, ALP 141, calcium 8.5.  CBC with significant leukocytosis WBC 24.7, microcytic anemia hemoglobin 9.5, thrombocytosis was 588.  Lactic acid 2.8.  Type and screen collected. Started Ceftriaxone and Azithromycin for sepsis likely secondary to pneumonia. ED physician consulted hospitalist to evaluation for admission.   Review of Systems: As mentioned in the history  of present illness. All other systems reviewed and are negative. Past Medical History:  Diagnosis Date   Acute diverticulitis 07/16/2013   Acute renal failure (HCC) 07/16/2013   Arthritis    hands, back, knees   Colitis 07/16/2013   Diverticulitis    Elevated CEA 08/15/2017   GERD (gastroesophageal reflux disease)    GIB (gastrointestinal bleeding) 01/07/2012   Hypertension    somewhat controlled, taking medication   Hypothyroidism    Intractable nausea and vomiting 05/30/2022   Lesion of epiglottis    Macrocytosis without anemia 08/15/2017   Orthostasis 08/22/2017   Past Surgical History:  Procedure Laterality Date   ABDOMINAL HYSTERECTOMY     APPENDECTOMY     CHOLECYSTECTOMY     COLONOSCOPY  01/09/2012   Procedure: COLONOSCOPY;  Surgeon: Shirley Friar, MD;  Location: [facility] ENDOSCOPY;  Service: Endoscopy;  Laterality: N/A;   COLONOSCOPY WITH PROPOFOL N/A 08/25/2017   Procedure: COLONOSCOPY WITH PROPOFOL;  Surgeon: Wyline Mood, MD;  Location: [facility] ENDOSCOPY;  Service: Gastroenterology;  Laterality: N/A;   ESOPHAGOGASTRODUODENOSCOPY  01/07/2012   Procedure: ESOPHAGOGASTRODUODENOSCOPY (EGD);  Surgeon: Vertell Novak., MD;  Location: [facility] ENDOSCOPY;  Service: Endoscopy;  Laterality: N/A;   ESOPHAGOGASTRODUODENOSCOPY (EGD) WITH PROPOFOL N/A 08/25/2017   Procedure: ESOPHAGOGASTRODUODENOSCOPY (EGD) WITH PROPOFOL;  Surgeon: Wyline Mood, MD;  Location: [facility] ENDOSCOPY;  Service: Gastroenterology;  Laterality: N/A;   VIDEO BRONCHOSCOPY Bilateral 05/14/2016   Procedure: VIDEO BRONCHOSCOPY WITHOUT FLUORO;  Surgeon: Leslye Peer, MD;  Location: [facility] ENDOSCOPY;  Service: Cardiopulmonary;  Laterality: Bilateral;   Social History:  reports that she has been smoking cigarettes. She has a 84.00 pack-year smoking history. She has never used smokeless tobacco. She reports current alcohol use of about 35.0 standard drinks of alcohol per week. She reports that she does not use drugs.  Lives at home with her  finance [place].   No Known Allergies  Family History  Problem Relation Age of Onset   Breast cancer Mother        multiple types of cancer   Stomach cancer Mother    Throat cancer Mother    Melanoma Mother    Kidney disease Father     Prior to Admission medications   Medication Sig Start Date End Date Taking? Authorizing Provider  albuterol (VENTOLIN HFA) 108 (90 Base) MCG/ACT inhaler Inhale 2 puffs into the lungs every 6 (six) hours as needed for wheezing or shortness of breath. 06/26/19   [provider]  Biotin 5000 MCG TABS Take 500 mg by mouth daily.    [provider]  Cholecalciferol (VITAMIN D-3) 5000 UNITS TABS Take 10,000 Units by mouth daily after breakfast.    [provider]  ferrous sulfate 325 (65 FE) MG tablet Take 1 tablet (325 mg total) by mouth every other day. 06/02/22 08/01/22  ShahmehdiGemma Payor, MD  levothyroxine (SYNTHROID, LEVOTHROID) 50 MCG tablet Take 50 mcg by mouth daily before breakfast.    [provider]  lisinopril (ZESTRIL) 10 MG tablet Take 10 mg by mouth daily. 07/15/19   [provider]  pantoprazole (PROTONIX) 40 MG tablet Take 1 tablet (40 mg total) by mouth 2 (two) times daily for 10 days. 06/01/22 06/11/22  ShahmehdiGemma Payor, MD  rosuvastatin (CRESTOR) 5 MG tablet Take 1 tablet (5 mg total) by mouth every evening. For cholesterol. 09/17/19   Doreene Nest, NP    Physical Exam: Vitals:   11/09/22 1242 11/09/22 1300 11/09/22 1400 11/09/22 1430  BP: (!) 85/66 (!) 80/67 (!) 81/52 (!) 81/63  Pulse: (!) 109 (!) 111 (!) 113 (!) 117  Resp: 17 (!) 21 19 (!) 23  Temp: 99 F (37.2 C)     SpO2: 92% 96% 100% 98%   GEN:     alert, thin chronically ill appearing female and no distress    HENT:  mucus membranes moist, oropharyngeal without lesions or erythema ,  nares patent, no nasal discharge  EYES:   pupils equal and reactive, EOM intact NECK:  supple, good ROM RESP:  no increased work of breathing,  frequent cough, coarse breathe sounds, faint expiratory wheezing CVS:   regular rate and rhythm, no murmur, distal pulses intact   ABD:  soft, lower abdominal tenderness, bowel sounds present; no palpable masses, no guarding, no rebound  EXT:   normal ROM, atraumatic, no edema NEURO:  normal without focal findings,  speech normal, alert and oriented  Skin:   warm and dry, abrasions on left lower anterior and posterior leg   Data Reviewed:  Relevant notes from primary care and specialist visits, past discharge summaries as available in EHR, including Care Everywhere. Prior diagnostic testing as pertinent to current admission diagnoses Updated medications and problem lists for reconciliation ED course, including vitals, labs, imaging, treatment and response to treatment Triage notes, nursing and pharmacy notes and ED provider's notes Notable results as noted in HPI  Assessment and Plan: Principal Problem:   Sepsis due to pneumonia (HCC)   Community-acquired pneumonia Stable on room air.  Tmax  76 F with productive cough, elevated white blood cell count and CXR concerning for bilateral home lung bases right greater than left and right lateral upper lung pneumonia. Appears to be most likely community-acquired pneumonia.  COVID, influenza and RSV testing  ordered. Started on ceftriaxone and azithromycin in the ED. -Admit to  med-tele -LR @ 150 ml/hr -Tylenol for fever  -Continue ceftriaxone and azithromycin -AM CBC  - PRN DuoNebs - Mucinex for cough  - MRSA PCR - Follow-up blood cultures - Strep pneumo urinary antigen - Legionella urinary antigen - COVID, influenza and RSV respiratory panel   Sepsis  Suspect 2/2 to pneumonia.  WBC   Lactic acid 2.8. down trending to 2.0.  Resolving with rehydration and IV antibiotics.  - repeat LA  Chronic Diarrhea  Last bowel movement was yesterday.  History of chronic abdominal pain and diverticulitis. ALP elevated 141 with normal lipase. CT  ABD/Pelvis on 05/30/22 showed chronic diverticulitis. No weight current weight available for comparison.  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  - Pt requests refill at discharge   Chronic Pain - Continue home Norco   Normocytic anemia  Hx of GI bleed Suspect likely secondary to chronic blood loss.  But near baseline at 9.5. - Trend hgb with CBC  - Type and screen collected in ED   Protein Calorie Malnutrition Albumin 2.1.   - Ensure protein supplement with meals     Advance Care Planning:   Code Status: Full Code   Consults: None  Family Communication: None  Severity of Illness: The appropriate patient status for this patient is INPATIENT. Inpatient status is judged to be reasonable and necessary in order to provide the required intensity of service to ensure the patient's safety. The patient's presenting symptoms, physical exam findings, and initial radiographic and laboratory data in the context of their chronic comorbidities is felt to place them at high risk for further clinical deterioration. Furthermore, it is not anticipated that the patient will be medically stable for discharge from the hospital within 2 midnights of admission.   * I certify that at the point of admission it is my clinical judgment that the patient will require inpatient hospital care spanning beyond 2 midnights from the point of admission due to high intensity of service, high risk for further deterioration and high  frequency of surveillance required.*  Author: Katha Cabal, DO 11/09/2022 2:04 PM  For on call review [email or web address].

## 2022-11-09 NOTE — Progress Notes (Signed)
Contacted by [facility] RN regarding pt's BP.  Last BP 104/62, 113/65.  MAP >65.  BP overall are trending upward.  Will hold on starting vasopressors for now.    Katha Cabal, DO

## 2022-11-09 NOTE — ED Provider Notes (Signed)
[facility] Provider Note    Event Date/Time   First MD Initiated Contact with Patient 11/09/22 1244     (approximate)   History   Cough   HPI  [name] is a 62 y.o. female with a history of tobacco use, substance abuse, chronic pain who presents with complaints of cough for approximately 3 weeks as well as nausea and diarrhea.  Patient reports she has not smoked in several weeks because of her cough.  She does feel short of breath.  She reports she has been having significant episodes of diarrhea primarily and because of this has eaten less and lost significant weight.     Physical Exam   Triage Vital Signs: ED Triage Vitals  Enc Vitals Group     BP 11/09/22 1242 (!) 85/66     Pulse Rate 11/09/22 1242 (!) 109     Resp 11/09/22 1242 17     Temp 11/09/22 1242 99 F (37.2 C)     Temp src --      SpO2 11/09/22 1242 92 %     Weight --      Height --      Head Circumference --      Peak Flow --      Pain Score 11/09/22 1241 6     Pain Loc --      Pain Edu? --      Excl. in GC? --     Most recent vital signs: Vitals:   11/09/22 1242  BP: (!) 85/66  Pulse: (!) 109  Resp: 17  Temp: 99 F (37.2 C)  SpO2: 92%     General: Awake, ill-appearing CV:  Good peripheral perfusion.  Tachycardia Resp:  Mild, scattered wheezes, bibasilar Rales Abd:  No distention.  Soft, nontender, reassuring exam Other:     ED Results / Procedures / Treatments   Labs (all labs ordered are listed, but only abnormal results are displayed) Labs Reviewed  COMPREHENSIVE METABOLIC PANEL - Abnormal; Notable for the following components:      Result Value   CO2 20 (*)    BUN 25 (*)    Creatinine, Ser 2.32 (*)    Calcium 8.5 (*)    Total Protein 5.8 (*)    Albumin 2.1 (*)    Alkaline Phosphatase 141 (*)    GFR, Estimated 23 (*)    All other components within normal limits  CBC - Abnormal; Notable for the following components:   WBC 24.7 (*)    RBC  3.06 (*)    Hemoglobin 9.5 (*)    HCT 30.0 (*)    RDW 17.5 (*)    Platelets 588 (*)    All other components within normal limits  LACTIC ACID, PLASMA - Abnormal; Notable for the following components:   Lactic Acid, Venous 2.8 (*)    All other components within normal limits  APTT - Abnormal; Notable for the following components:   aPTT 41 (*)    All other components within normal limits  PROTIME-INR - Abnormal; Notable for the following components:   Prothrombin Time 15.6 (*)    All other components within normal limits  CULTURE, BLOOD (ROUTINE X 2)  CULTURE, BLOOD (ROUTINE X 2)  C DIFFICILE QUICK SCREEN W PCR REFLEX    GASTROINTESTINAL PANEL BY PCR, STOOL (REPLACES STOOL CULTURE)  LIPASE, BLOOD  URINALYSIS, ROUTINE W REFLEX MICROSCOPIC  LACTIC ACID, PLASMA  TYPE AND SCREEN     EKG  ED ECG REPORT I, Jene Every, the attending physician, personally viewed and interpreted this ECG.  Date: 11/09/2022  Rhythm: Sinus tachycardia QRS Axis: normal Intervals: normal ST/T Wave abnormalities: normal Narrative Interpretation: no evidence of acute ischemia    RADIOLOGY Chest x-ray viewed interpreted by me, suspicious for bibasilar infiltrates    PROCEDURES:  Critical Care performed: yes CRITICAL CARE Performed by: Jene Every   Total critical care time: 30 minutes  Critical care time was exclusive of separately billable procedures and treating other patients.  Critical care was necessary to treat or prevent imminent or life-threatening deterioration.  Critical care was time spent personally by me on the following activities: development of treatment plan with patient and/or surrogate as well as nursing, discussions with consultants, evaluation of patient's response to treatment, examination of patient, obtaining history from patient or surrogate, ordering and performing treatments and interventions, ordering and review of laboratory studies, ordering and review of  radiographic studies, pulse oximetry and re-evaluation of patient's condition.   Procedures   MEDICATIONS ORDERED IN ED: Medications  lactated ringers infusion ( Intravenous New Bag/Given 11/09/22 1340)  sodium chloride 0.9 % bolus 1,000 mL (1,000 mLs Intravenous New Bag/Given 11/09/22 1340)  cefTRIAXone (ROCEPHIN) 2 g in sodium chloride 0.9 % 100 mL IVPB (2 g Intravenous New Bag/Given 11/09/22 1342)  azithromycin (ZITHROMAX) 500 mg in sodium chloride 0.9 % 250 mL IVPB (500 mg Intravenous New Bag/Given 11/09/22 1341)  0.9 %  sodium chloride infusion ( Intravenous New Bag/Given 11/09/22 1324)  ipratropium-albuterol (DUONEB) 0.5-2.5 (3) MG/3ML nebulizer solution 3 mL (3 mLs Nebulization Given 11/09/22 1322)  morphine (PF) 4 MG/ML injection 4 mg (4 mg Intravenous Given 11/09/22 1324)  ondansetron (ZOFRAN) injection 4 mg (4 mg Intravenous Given 11/09/22 1324)     IMPRESSION / MDM / ASSESSMENT AND PLAN / ED COURSE  I reviewed the triage vital signs and the nursing notes. Patient's presentation is most consistent with acute presentation with potential threat to life or bodily function.  Patient presents with cough, shortness of breath, noted to be hypotensive and tachycardic upon arrival.  Oxygen saturations 90% on room air.  Differential includes pneumonia, sepsis, severe dehydration, kidney injury  Will obtain labs, chest x-ray, give IV fluids, IV morphine and Zofran for nausea and abdominal cramping discomfort.  Abdominal exam is reassuring, patient does have a history of chronic abdominal pain as well as a cyclical vomiting syndrome.  Given her diarrhea we will check C. difficile, GI panel. ----------------------------------------- 1:28 PM on 11/09/2022 -----------------------------------------  Notably patient's white blood cell count is elevated at 24.7, hemoglobin is not significantly changed prior Given elevated white blood cell count, tachycardia, concern for infiltrates on chest x-ray will  activate code sepsis, will treat with IV Rocephin, azithromycin  Patient requires 1500 cc of fluid to meet 30 mL/kg, she has already received 1000, will add an additional 1000 mL normal saline bolus  Sepsis - Repeat Assessment  Performed at:    215 p  Vitals     Blood pressure (!) 85/66, pulse (!) 109, temperature 99 F (37.2 C), resp. rate 17, SpO2 92 %.  Heart:     Tachycardic  Lungs:    Rales  Capillary Refill:   <2 sec  Peripheral Pulse:   Radial pulse palpable  Skin:     Normal Color         FINAL CLINICAL IMPRESSION(S) / ED DIAGNOSES   Final diagnoses:  Community acquired pneumonia, unspecified laterality  Sepsis with acute renal failure  and septic shock, due to unspecified organism, unspecified acute renal failure type (HCC)     Rx / DC Orders   ED Discharge Orders     None        Note:  This document was prepared using Dragon voice recognition software and may include unintentional dictation errors.   Jene Every, MD 11/09/22 1415

## 2022-11-09 NOTE — Sepsis Progress Note (Signed)
Notified provider of need to order vasopressors, continue with hypotension.

## 2022-11-09 NOTE — Sepsis Progress Note (Signed)
Elink monitoring for the code sepsis protocol.  

## 2022-11-10 ENCOUNTER — Inpatient Hospital Stay: Payer: Commercial Managed Care - HMO

## 2022-11-10 ENCOUNTER — Encounter: Payer: Self-pay | Admitting: Family Medicine

## 2022-11-10 DIAGNOSIS — N179 Acute kidney failure, unspecified: Secondary | ICD-10-CM | POA: Diagnosis not present

## 2022-11-10 DIAGNOSIS — J13 Pneumonia due to Streptococcus pneumoniae: Secondary | ICD-10-CM

## 2022-11-10 DIAGNOSIS — J9601 Acute respiratory failure with hypoxia: Secondary | ICD-10-CM | POA: Diagnosis not present

## 2022-11-10 DIAGNOSIS — R6520 Severe sepsis without septic shock: Secondary | ICD-10-CM

## 2022-11-10 DIAGNOSIS — A419 Sepsis, unspecified organism: Secondary | ICD-10-CM

## 2022-11-10 LAB — CULTURE, BLOOD (ROUTINE X 2)

## 2022-11-10 LAB — LACTIC ACID, PLASMA: Lactic Acid, Venous: 1.3 mmol/L (ref 0.5–1.9)

## 2022-11-10 LAB — CBC
HCT: 23.2 % — ABNORMAL LOW (ref 36.0–46.0)
Hemoglobin: 7.4 g/dL — ABNORMAL LOW (ref 12.0–15.0)
MCH: 31.4 pg (ref 26.0–34.0)
MCHC: 31.9 g/dL (ref 30.0–36.0)
MCV: 98.3 fL (ref 80.0–100.0)
Platelets: 473 10*3/uL — ABNORMAL HIGH (ref 150–400)
RBC: 2.36 MIL/uL — ABNORMAL LOW (ref 3.87–5.11)
RDW: 17.6 % — ABNORMAL HIGH (ref 11.5–15.5)
WBC: 12.5 10*3/uL — ABNORMAL HIGH (ref 4.0–10.5)
nRBC: 0 % (ref 0.0–0.2)

## 2022-11-10 LAB — MRSA NEXT GEN BY PCR, NASAL: MRSA by PCR Next Gen: NOT DETECTED

## 2022-11-10 LAB — COMPREHENSIVE METABOLIC PANEL
ALT: 14 U/L (ref 0–44)
AST: 20 U/L (ref 15–41)
Albumin: 1.6 g/dL — ABNORMAL LOW (ref 3.5–5.0)
Alkaline Phosphatase: 183 U/L — ABNORMAL HIGH (ref 38–126)
Anion gap: 6 (ref 5–15)
BUN: 19 mg/dL (ref 6–20)
CO2: 20 mmol/L — ABNORMAL LOW (ref 22–32)
Calcium: 7.5 mg/dL — ABNORMAL LOW (ref 8.9–10.3)
Chloride: 113 mmol/L — ABNORMAL HIGH (ref 98–111)
Creatinine, Ser: 1.73 mg/dL — ABNORMAL HIGH (ref 0.44–1.00)
GFR, Estimated: 33 mL/min — ABNORMAL LOW (ref >60)
Glucose, Bld: 138 mg/dL — ABNORMAL HIGH (ref 70–99)
Potassium: 4.2 mmol/L (ref 3.5–5.1)
Sodium: 139 mmol/L (ref 135–145)
Total Bilirubin: 0.3 mg/dL (ref 0.3–1.2)
Total Protein: 4.4 g/dL — ABNORMAL LOW (ref 6.5–8.1)

## 2022-11-10 LAB — EXPECTORATED SPUTUM ASSESSMENT W GRAM STAIN, RFLX TO RESP C

## 2022-11-10 LAB — FERRITIN: Ferritin: 119 ng/mL (ref 11–307)

## 2022-11-10 LAB — IRON AND TIBC
Iron: 9 ug/dL — ABNORMAL LOW (ref 28–170)
Saturation Ratios: 9 % — ABNORMAL LOW (ref 10.4–31.8)
TIBC: 102 ug/dL — ABNORMAL LOW (ref 250–450)
UIBC: 93 ug/dL

## 2022-11-10 LAB — CULTURE, RESPIRATORY W GRAM STAIN

## 2022-11-10 LAB — VITAMIN B12: Vitamin B-12: 2141 pg/mL — ABNORMAL HIGH (ref 180–914)

## 2022-11-10 LAB — FOLATE: Folate: 8.4 ng/mL (ref >5.9)

## 2022-11-10 MED ORDER — ENSURE ENLIVE PO LIQD
237.0000 mL | Freq: Three times a day (TID) | ORAL | Status: DC
  Administered 2022-11-10 – 2022-11-14 (×9): 237 mL via ORAL

## 2022-11-10 MED ORDER — PHENOL 1.4 % MT LIQD: 1.0000 | OROMUCOSAL | Status: DC | PRN

## 2022-11-10 MED ORDER — NYSTATIN 100000 UNIT/ML MT SUSP
5.0000 mL | Freq: Four times a day (QID) | OROMUCOSAL | Status: DC
  Administered 2022-11-10 – 2022-11-11 (×2): 500000 [IU] via ORAL
  Filled 2022-11-10 (×2): qty 5

## 2022-11-10 NOTE — Progress Notes (Signed)
Progress Note    [name]  ZOX:[id number] DOB: June 30, 1960  DOA: 11/09/2022 PCP: [facility]      Brief Narrative:    Medical records reviewed and are as summarized below:  [name] is a 62 y.o. female with medical history significant for COPD, hypertension, CKD stage IIIb, iron deficiency anemia, hyperlipidemia, hypothyroidism, polysubstance use disorder, history of GI bleed, history of diverticulitis who presented to the hospital with cough, shortness of breath, diarrhea, lower abdominal pain for about 3 weeks.    She was admitted to the hospital for severe sepsis secondary to pneumococcal pneumonia.   Assessment/Plan:   Principal Problem:   Severe sepsis (HCC) Active Problems:   Substance use disorder   Pneumococcal pneumonia (HCC)   AKI (acute kidney injury) (HCC)   Acute respiratory failure with hypoxia (HCC)   Stage 3 chronic kidney disease (HCC)   Chronic obstructive pulmonary disease (HCC)   Abdominal pain   Diarrhea   Iron deficiency anemia   Body mass index is 20.36 kg/m.   Severe sepsis secondary to pneumococcal pneumonia: Lactic acid was 2.8, WBC 24.7, heart rate 123, respiratory rate 23 on admission.  Strep urinary antigen was positive.  Continue IV ceftriaxone.  Discontinue IV azithromycin.   AKI on CKD stage IIIb: Creatinine improved to baseline.   Acute hypoxic respiratory failure: Use oxygen as needed.  She may also have underlying chronic hypoxic respiratory failure from COPD.   Lower abdominal pain, history of chronic abdominal pain, chronic diarrhea and diverticulitis: Obtain CT abdomen and pelvis without contrast for further evaluation   Diarrhea: Improved.   Polysubstance use disorder: Check urine drug screen   Left leg ulcers: Apply wet-to-dry dressing daily.   Iron deficiency anemia, acute on chronic anemia: Hemoglobin is down from 9.5-7.4.  Monitor H&H and transfuse PRBCs as needed.  Iron 9, TIBC 102,  saturation ratio 9 and ferritin 119.  Vitamin B12 level is pending.     Diet Order             Diet Heart Room service appropriate? Yes; Fluid consistency: Thin  Diet effective now                            Consultants: None  Procedures: None    Medications:    enoxaparin (LOVENOX) injection  30 mg Subcutaneous Q24H   ferrous sulfate  325 mg Oral QODAY   guaiFENesin  600 mg Oral BID   levothyroxine  50 mcg Oral Q0600   multivitamin with minerals  1 tablet Oral Daily   predniSONE  40 mg Oral Q breakfast   Ensure Max Protein  11 oz Oral TID with meals   rosuvastatin  5 mg Oral QPM   traZODone  50-100 mg Oral QHS   Continuous Infusions:  cefTRIAXone (ROCEPHIN)  IV Stopped (11/09/22 1421)     Anti-infectives (From admission, onward)    Start     Dose/Rate Route Frequency Ordered Stop   11/09/22 1330  cefTRIAXone (ROCEPHIN) 2 g in sodium chloride 0.9 % 100 mL IVPB        2 g 200 mL/hr over 30 Minutes Intravenous Every 24 hours 11/09/22 1328 11/14/22 1329   11/09/22 1330  azithromycin (ZITHROMAX) 500 mg in sodium chloride 0.9 % 250 mL IVPB  Status:  Discontinued        500 mg 250 mL/hr over 60 Minutes Intravenous Every 24 hours 11/09/22 1328  11/10/22 1249              Family Communication/Anticipated D/C date and plan/Code Status   DVT prophylaxis: enoxaparin (LOVENOX) injection 30 mg Start: 11/10/22 2200     Code Status: Full Code  Family Communication: Plan discussed with significant other at the bedside Disposition Plan: Plan to discharge home in 2 to 3 days   Status is: Inpatient Remains inpatient appropriate because: Pneumonia, acute hypoxic respiratory failure       Subjective:   Interval events noted.  She complains of cough and shortness of breath with exertion.  Her oxygen saturation dropped into the 60s when she ambulated in the ED.  She has a complaints of pain in the left leg.  She has ulcers on the leg from trauma  (hitting her leg against an object).  She had been having diarrhea for about a month and had " a smidgen of blood" in stools.  She said she has not had any diarrhea since admission.  Her significant other was at the bedside    Objective:    Vitals:   11/10/22 0730 11/10/22 0800 11/10/22 0830 11/10/22 0900  BP: 128/65 118/86 128/70 134/82  Pulse: 95 96 (!) 101 (!) 108  Resp: 13 14 (!) 36 (!) 29  Temp:  98.9 F (37.2 C)    TempSrc:  Oral    SpO2: 94% 95% 98% 100%  Weight:       No data found.   Intake/Output Summary (Last 24 hours) at 11/10/2022 1258 Last data filed at 11/10/2022 1024 Gross per 24 hour  Intake 1000 ml  Output --  Net 1000 ml   Filed Weights   11/09/22 1627  Weight: 44.2 kg    Exam:  GEN: NAD SKIN: Warm and dry EYES: No pallor or icterus ENT: MMM CV: RRR PULM: Bibasilar rales, no wheezing ABD: soft, ND, lower abdominal tenderness without rebound tenderness or guarding, +BS CNS: AAO x 3, non focal, slurred speech (she attributes slurred speech to lacking teeth) EXT: Ulcer on the anterior aspect of distal left leg and also on the posterior aspect of the distal left leg.  Left leg tenderness.  No edema           Data Reviewed:   I have personally reviewed following labs and imaging studies:  Labs: Labs show the following:   Basic Metabolic Panel: Recent Labs  Lab 11/09/22 1308 11/10/22 0530  NA 138 139  K 4.0 4.2  CL 105 113*  CO2 20* 20*  GLUCOSE 91 138*  BUN 25* 19  CREATININE 2.32* 1.73*  CALCIUM 8.5* 7.5*   GFR Estimated Creatinine Clearance: 22.3 mL/min (A) (by C-G formula based on SCr of 1.73 mg/dL (H)). Liver Function Tests: Recent Labs  Lab 11/09/22 1308 11/10/22 0530  AST 31 20  ALT 18 14  ALKPHOS 141* 183*  BILITOT 0.6 0.3  PROT 5.8* 4.4*  ALBUMIN 2.1* 1.6*   Recent Labs  Lab 11/09/22 1308  LIPASE 39   No results for input(s): "AMMONIA" in the last 168 hours. Coagulation profile Recent Labs  Lab  11/09/22 1308  INR 1.2    CBC: Recent Labs  Lab 11/09/22 1308 11/10/22 0530  WBC 24.7* 12.5*  HGB 9.5* 7.4*  HCT 30.0* 23.2*  MCV 98.0 98.3  PLT 588* 473*   Cardiac Enzymes: No results for input(s): "CKTOTAL", "CKMB", "CKMBINDEX", "TROPONINI" in the last 168 hours. BNP (last 3 results) No results for input(s): "PROBNP" in the last 8760  hours. CBG: No results for input(s): "GLUCAP" in the last 168 hours. D-Dimer: No results for input(s): "DDIMER" in the last 72 hours. Hgb A1c: No results for input(s): "HGBA1C" in the last 72 hours. Lipid Profile: No results for input(s): "CHOL", "HDL", "LDLCALC", "TRIG", "CHOLHDL", "LDLDIRECT" in the last 72 hours. Thyroid function studies: No results for input(s): "TSH", "T4TOTAL", "T3FREE", "THYROIDAB" in the last 72 hours.  Invalid input(s): "FREET3" Anemia work up: Recent Labs    11/10/22 0527  FOLATE 8.4  FERRITIN 119  TIBC 102*  IRON 9*   Sepsis Labs: Recent Labs  Lab 11/09/22 1308 11/09/22 1531 11/10/22 0530  WBC 24.7*  --  12.5*  LATICACIDVEN 2.8* 2.0* 1.3    Microbiology Recent Results (from the past 240 hour(s))  Blood culture (routine x 2)     Status: None (Preliminary result)   Collection Time: 11/09/22  1:08 PM   Specimen: BLOOD  Result Value Ref Range Status   Specimen Description BLOOD BLOOD LEFT ARM  Final   Special Requests   Final    BOTTLES DRAWN AEROBIC AND ANAEROBIC Blood Culture adequate volume   Culture   Final    NO GROWTH < 24 HOURS Performed at [facility], [address], [place], [place] [address]    Report Status PENDING  Incomplete  Blood culture (routine x 2)     Status: None (Preliminary result)   Collection Time: 11/09/22  1:35 PM   Specimen: BLOOD  Result Value Ref Range Status   Specimen Description BLOOD RIGHT ANTECUBITAL  Final   Special Requests   Final    BOTTLES DRAWN AEROBIC AND ANAEROBIC Blood Culture adequate volume   Culture   Final    NO GROWTH < 24  HOURS Performed at [facility], [address] [place], [place] [address]    Report Status PENDING  Incomplete  Resp panel by RT-PCR (RSV, Flu A&B, Covid) Anterior Nasal Swab     Status: None   Collection Time: 11/09/22  3:41 PM   Specimen: Anterior Nasal Swab  Result Value Ref Range Status   SARS Coronavirus 2 by RT PCR NEGATIVE NEGATIVE Final    Comment: (NOTE) SARS-CoV-2 target nucleic acids are NOT DETECTED.  The SARS-CoV-2 RNA is generally detectable in upper respiratory specimens during the acute phase of infection. The lowest concentration of SARS-CoV-2 viral copies this assay can detect is 138 copies/mL. A negative result does not preclude SARS-Cov-2 infection and should not be used as the sole basis for treatment or other patient management decisions. A negative result may occur with  improper specimen collection/handling, submission of specimen other than nasopharyngeal swab, presence of viral mutation(s) within the areas targeted by this assay, and inadequate number of viral copies(<138 copies/mL). A negative result must be combined with clinical observations, patient history, and epidemiological information. The expected result is Negative.  Fact Sheet for Patients:  BloggerCourse.com  Fact Sheet for Healthcare Providers:  SeriousBroker.it  This test is no t yet approved or cleared by the [place] FDA and  has been authorized for detection and/or diagnosis of SARS-CoV-2 by FDA under an Emergency Use Authorization (EUA). This EUA will remain  in effect (meaning this test can be used) for the duration of the COVID-19 declaration under Section 564(b)(1) of the Act, 21 U.S.C.section 360bbb-3(b)(1), unless the authorization is terminated  or revoked sooner.       Influenza A by PCR NEGATIVE NEGATIVE Final   Influenza B by PCR NEGATIVE NEGATIVE Final  Comment: (NOTE) The Xpert Xpress  SARS-CoV-2/FLU/RSV plus assay is intended as an aid in the diagnosis of influenza from Nasopharyngeal swab specimens and should not be used as a sole basis for treatment. Nasal washings and aspirates are unacceptable for Xpert Xpress SARS-CoV-2/FLU/RSV testing.  Fact Sheet for Patients: BloggerCourse.com  Fact Sheet for Healthcare Providers: SeriousBroker.it  This test is not yet approved or cleared by the [place] FDA and has been authorized for detection and/or diagnosis of SARS-CoV-2 by FDA under an Emergency Use Authorization (EUA). This EUA will remain in effect (meaning this test can be used) for the duration of the COVID-19 declaration under Section 564(b)(1) of the Act, 21 U.S.C. section 360bbb-3(b)(1), unless the authorization is terminated or revoked.     Resp Syncytial Virus by PCR NEGATIVE NEGATIVE Final    Comment: (NOTE) Fact Sheet for Patients: BloggerCourse.com  Fact Sheet for Healthcare Providers: SeriousBroker.it  This test is not yet approved or cleared by the [place] FDA and has been authorized for detection and/or diagnosis of SARS-CoV-2 by FDA under an Emergency Use Authorization (EUA). This EUA will remain in effect (meaning this test can be used) for the duration of the COVID-19 declaration under Section 564(b)(1) of the Act, 21 U.S.C. section 360bbb-3(b)(1), unless the authorization is terminated or revoked.  Performed at [facility], [address] [place], [place] [address]   MRSA Next Gen by PCR, Nasal     Status: None   Collection Time: 11/10/22 10:25 AM   Specimen: Nasal Mucosa; Nasal Swab  Result Value Ref Range Status   MRSA by PCR Next Gen NOT DETECTED NOT DETECTED Final    Comment: (NOTE) The GeneXpert MRSA Assay (FDA approved for NASAL specimens only), is one component of a comprehensive MRSA colonization  surveillance program. It is not intended to diagnose MRSA infection nor to guide or monitor treatment for MRSA infections. Test performance is not FDA approved in patients less than 18 years old. Performed at [facility], [address] [place], [place] [address]   Expectorated Sputum Assessment w Gram Stain, Rflx to Resp Cult     Status: None   Collection Time: 11/10/22 10:25 AM   Specimen: Expectorated Sputum  Result Value Ref Range Status   Specimen Description EXPECTORATED SPUTUM  Final   Special Requests NONE  Final   Sputum evaluation   Final    THIS SPECIMEN IS ACCEPTABLE FOR SPUTUM CULTURE Performed at [facility], [address] [place], [place] [address]    Report Status 11/10/2022 FINAL  Final    Procedures and diagnostic studies:  DG Chest Port 1 View  Result Date: 11/09/2022 CLINICAL DATA:  Cough x3 weeks EXAM: PORTABLE CHEST - 1 VIEW COMPARISON:  10/10/2021 FINDINGS: New patchy airspace opacities in the lung bases right greater than left, and laterally in the right upper lung. Heart size and mediastinal contours are within normal limits. No effusion. Visualized bones unremarkable. IMPRESSION: New patchy bilateral airspace disease. Electronically Signed   By: Corlis Leak M.D.   On: 11/09/2022 13:44               LOS: 1 day   [name]  [facility]   Pager on [email or web address]. If 7PM-7AM, please contact night-coverage at [email or web address]     11/10/2022, 12:58 PM

## 2022-11-10 NOTE — ED Notes (Signed)
Pt. Sitting up at bedside, eating lunch, NAD. Denies any further need at this time. Bed is locked and low, call light within reach, pt's belonging within reach, pt. Denies any further neede at this time.

## 2022-11-10 NOTE — ED Notes (Signed)
This RN to bedside for pt. Sitting on side of bed, pt. States she is in a lot of pain, and cannot get comfortable. This RN states that she can get pt. Her pain medicine, encouraged pt. To lay back in the bed for her safety and to avoid falling. Bed in locked and low position, call light within reach, pt's personal belongings within reach.

## 2022-11-10 NOTE — ED Notes (Addendum)
This RN to bedside to chat with pt. And family. Pt. Tells this RN that she has had a fall, around 1530 today, and didn't want this RN to tell anyone. This RN explained to pt. That her physician must be told, and that she needs to be checked out. When describing the fall, pt. States she was standing up by herself, which she knows this RN told her not to do, and she lost her balance and fell onto her right side. Pt. States she did not hit her head, refuses imaging or intervention. Pt. Absolutely refused to be seen by physician, states, "I'm fine! I got myself up, I'm moving everything! I do NOT want that doctor coming in my room!" Pt. Insists that she is fine, she got herself up, did not hit her head, and the only place that is sore is her left inner thigh, but she can move everywhere and will not be seen by MD. This RN attempts to reason with pt several times, but pt. Refuses to be seen by MD. Full set of VS obtained. No apparent injuries to pt. Dr. Myriam Forehand notified. Confirmed notification. Consulting civil engineer notified.  Next shift RN notified.

## 2022-11-10 NOTE — Evaluation (Signed)
Physical Therapy Evaluation & Discharge Patient Details Name: [name] MRN: [id number] DOB: February 04, 1961 Today's Date: 11/10/2022  History of Present Illness  62 y/o female presented to ED on 11/09/22 for cough x 3 weeks with diarrhea and lower abdominal pain. Admitted for sepsis 2/2 PNA. PMH: COPD, HTN, CKD stage 3, polysubstance abuse, hx of GI bleed  Clinical Impression  Patient admitted with the above. PTA, patient lives with fiance and reports independence.  Currently, patient functioning at modI with HHA due to being "slower than usual" but otherwise steady gait. Encouraged patient to call RN for assistance with IV pole but to ambulate to/from bathroom. No further skilled [name] needs identified acutely. No [name] follow up recommended at this time.      Recommendations for follow up therapy are one component of a multi-disciplinary discharge planning process, led by the attending physician.  Recommendations may be updated based on patient status, additional functional criteria and insurance authorization.  Follow Up Recommendations       Assistance Recommended at Discharge PRN  Patient can return home with the following  Help with stairs or ramp for entrance    Equipment Recommendations None recommended by [name]  Recommendations for Other Services       Functional Status Assessment Patient has had a recent decline in their functional status and demonstrates the ability to make significant improvements in function in a reasonable and predictable amount of time.     Precautions / Restrictions Precautions Precautions: Fall Restrictions Weight Bearing Restrictions: No      Mobility  Bed Mobility Overal bed mobility: Independent                  Transfers Overall transfer level: Modified independent Equipment used: None                    Ambulation/Gait Ambulation/Gait assistance: Modified independent (Device/Increase time) Gait Distance (Feet): 200  Feet Assistive device: 1 person hand held assist Gait Pattern/deviations: Step-through pattern Gait velocity: decreased     General Gait Details: grasping therapist arm due to feeling "slower than usual" but otherwise steady gait  Stairs            Wheelchair Mobility    Modified Rankin (Stroke Patients Only)       Balance Overall balance assessment: Mild deficits observed, not formally tested                                           Pertinent Vitals/Pain      Home Living Family/patient expects to be discharged to:: Private residence Living Arrangements: Spouse/significant other Available Help at Discharge: Family;Available PRN/intermittently Type of Home: House Home Access: Stairs to enter   Entrance Stairs-Number of Steps: 5   Home Layout: Multi-level (split level)        Prior Function Prior Level of Function : Independent/Modified Independent;Driving;Working/employed                     Hand Dominance        Extremity/Trunk Assessment   Upper Extremity Assessment Upper Extremity Assessment: Defer to OT evaluation    Lower Extremity Assessment Lower Extremity Assessment: Generalized weakness       Communication      Cognition Arousal/Alertness: Awake/alert Behavior During Therapy: WFL for tasks assessed/performed Overall Cognitive Status: Within Functional Limits for tasks assessed  General Comments      Exercises     Assessment/Plan    [name] Assessment Patient does not need any further [name] services  [name] Problem List         [name] Treatment Interventions      [name] Goals (Current goals can be found in the Care Plan section)  Acute Rehab [name] Goals Patient Stated Goal: to get rid of this cough [name] Goal Formulation: All assessment and education complete, DC therapy    Frequency       Co-evaluation               AM-PAC [name] "6 Clicks" Mobility   Outcome Measure Help needed turning from your back to your side while in a flat bed without using bedrails?: None Help needed moving from lying on your back to sitting on the side of a flat bed without using bedrails?: None Help needed moving to and from a bed to a chair (including a wheelchair)?: None Help needed standing up from a chair using your arms (e.g., wheelchair or bedside chair)?: None Help needed to walk in hospital room?: None Help needed climbing 3-5 steps with a railing? : A Little 6 Click Score: 23    End of Session   Activity Tolerance: Patient tolerated treatment well Patient left: in bed;with call bell/phone within reach Nurse Communication: Mobility status [name] Visit Diagnosis: Unsteadiness on feet (R26.81);Muscle weakness (generalized) (M62.81)    Time: 1610-9604 [name] Time Calculation (min) (ACUTE ONLY): 20 min   Charges:   [name] Evaluation $[name] Eval Low Complexity: 1 Low          [name], [name], [name] Physical Therapist - [place]  [facility]   Evaleigh Mccamy A Essense Bousquet 11/10/2022, 10:11 AM

## 2022-11-10 NOTE — ED Notes (Addendum)
Pt. Ambulated in hall by [place], pct. Within 15 ft. Of starting walk, pt's O2 sat dropped to 60%. Pt. Transported back to pt. Room and place on O2. Pt's oxygen sat recovered quickly/ Dr. Myriam Forehand notified. Pt.

## 2022-11-10 NOTE — ED Notes (Signed)
This RN check on pt, who states she feels much better after taking a little nap, and she will let this RN know if she begins to feel any change.

## 2022-11-10 NOTE — ED Notes (Signed)
Pt. Sitting up in bed, eating breakfast independently. Pt. Is conversational, cooperative, denies any further need at this time.

## 2022-11-10 NOTE — ED Notes (Signed)
This RN walked into talk to pt. and she didn't seem quite herself, she said she wanted to lay down and rest. This RN pressed her as to what she was feeling, and she said she felt flushed and a strange sensation in her abdomen/chest midline. She also said she was nausous. This RN got her into bed, VS  obtained, prn zofran provided. EKG obtained. Dr. Myriam Forehand notified.

## 2022-11-10 NOTE — ED Notes (Signed)
This RN to bedside to assist pt. Onto bedside commode. Pt. Wearing non-slip socks, up to commode x1 assist. Pt. Gait is unstable, this RN reminded that she is not to get up on her own, because she is unstable, and for her safety to avoid falls. Pt. Verbalizes understanding. Pt. Assisted back to bed by this RN, call light in reach, bed in lowest position, pt. Belonging within reach.

## 2022-11-10 NOTE — Evaluation (Signed)
Occupational Therapy Evaluation Patient Details Name: [name] MRN: [id number] DOB: 01/06/1961 Today's Date: 11/10/2022   History of Present Illness 62 y/o female presented to ED on 11/09/22 for cough x 3 weeks with diarrhea and lower abdominal pain. Admitted for sepsis 2/2 PNA. PMH: COPD, HTN, CKD stage 3, polysubstance abuse, hx of GI bleed   Clinical Impression   [name] was seen for OT evaluation this date. Pt presents to acute OT services at or near functional baseline. With her primary ADL concern being safety and comfort with eating and drinking after her recent oral surgery. Pt noted to have some difficulty with swallowing food during session and reports swallowing is very painful. MD notified pt may benefit form SLP consult. No acute OT needs identified. Will sign off at this time, please re-consult if additional needs arise during this hospital stay.      Recommendations for follow up therapy are one component of a multi-disciplinary discharge planning process, led by the attending physician.  Recommendations may be updated based on patient status, additional functional criteria and insurance authorization.   Assistance Recommended at Discharge PRN  Patient can return home with the following Help with stairs or ramp for entrance;Assistance with cooking/housework    Functional Status Assessment  Patient has not had a recent decline in their functional status  Equipment Recommendations       Recommendations for Other Services Speech consult     Precautions / Restrictions Precautions Precautions: Fall Restrictions Weight Bearing Restrictions: No      Mobility Bed Mobility Overal bed mobility: Independent                  Transfers Overall transfer level: Modified independent Equipment used: None                      Balance Overall balance assessment: Mild deficits observed, not formally tested                                          ADL either performed or assessed with clinical judgement   ADL Overall ADL's : At baseline                                       General ADL Comments: Pt at or near functional baseline for ADL management. She endorses her primary ADL concern at this time is feeding and swallowing 2/2 significant increased pain with chewing, drinking, and swallowing after her recent oral surgery. SLP consult requested.     Vision Baseline Vision/History: 1 Wears glasses Ability to See in Adequate Light: 1 Impaired Patient Visual Report: No change from baseline       Perception     Praxis      Pertinent Vitals/Pain Pain Assessment Pain Assessment: Faces Faces Pain Scale: Hurts even more Pain Location: mouth with any eating or drinking Pain Descriptors / Indicators: Aching, Grimacing, Sore Pain Intervention(s): Limited activity within patient's tolerance, Monitored during session     Hand Dominance     Extremity/Trunk Assessment Upper Extremity Assessment Upper Extremity Assessment: Overall WFL for tasks assessed   Lower Extremity Assessment Lower Extremity Assessment: Generalized weakness       Communication Communication Communication: No difficulties   Cognition Arousal/Alertness: Awake/alert Behavior During Therapy: WFL for tasks  assessed/performed Overall Cognitive Status: Within Functional Limits for tasks assessed                                       General Comments       Exercises Other Exercises Other Exercises: Pt educated on role of OT in acute setting, falls prevention strategies for home and hospital, and routines modifications to support safety and functional independence with ADL management.   Shoulder Instructions      Home Living Family/patient expects to be discharged to:: Private residence Living Arrangements: Spouse/significant other Available Help at Discharge: Family;Available PRN/intermittently Type of Home:  House Home Access: Stairs to enter Entrance Stairs-Number of Steps: 5   Home Layout: Multi-level (split level)     Bathroom Shower/Tub: Walk-in shower         Home Equipment: Shower seat          Prior Functioning/Environment Prior Level of Function : Independent/Modified Independent;Driving;Working/employed             Mobility Comments: Pt independent, works for [employer], active job, denies falls history in the last year. ADLs Comments: Independent with ADL/IADL        OT Problem List: Decreased range of motion;Decreased activity tolerance;Decreased safety awareness;Decreased knowledge of use of DME or AE      OT Treatment/Interventions:      OT Goals(Current goals can be found in the care plan section) Acute Rehab OT Goals Patient Stated Goal: To go home OT Goal Formulation: All assessment and education complete, DC therapy Time For Goal Achievement: 11/10/22 Potential to Achieve Goals: Good  OT Frequency:      Co-evaluation              AM-PAC OT "6 Clicks" Daily Activity     Outcome Measure Help from another person eating meals?: None Help from another person taking care of personal grooming?: None Help from another person toileting, which includes using toliet, bedpan, or urinal?: None Help from another person bathing (including washing, rinsing, drying)?: None Help from another person to put on and taking off regular upper body clothing?: None Help from another person to put on and taking off regular lower body clothing?: None 6 Click Score: 24   End of Session Equipment Utilized During Treatment: Gait belt;Rolling walker (2 wheels) Nurse Communication: Other (comment)  Activity Tolerance: Patient tolerated treatment well Patient left: in bed;with call bell/phone within reach  OT Visit Diagnosis: Other abnormalities of gait and mobility (R26.89);Muscle weakness (generalized) (M62.81)                Time: 1610-9604 OT Time Calculation (min): 14  min Charges:  OT General Charges $OT Visit: 1 Visit OT Evaluation $OT Eval Low Complexity: 1 Low  [name], M.S., OTR/L 11/10/22, 11:49 AM

## 2022-11-10 NOTE — ED Notes (Signed)
PT at bedside. pt. Ambulated to bathroom and back to room.

## 2022-11-10 NOTE — ED Notes (Signed)
Pt's sister in law and daughter to bedside to visit. With pt.

## 2022-11-10 NOTE — Evaluation (Signed)
Clinical/Bedside Swallow Evaluation Patient Details  Name: [name] MRN: [id number] Date of Birth: 07-01-1960  Today's Date: 11/10/2022 Time: SLP Start Time (ACUTE ONLY): 1245 SLP Stop Time (ACUTE ONLY): 1340 SLP Time Calculation (min) (ACUTE ONLY): 55 min  Past Medical History:  Past Medical History:  Diagnosis Date   Acute diverticulitis 07/16/2013   Acute renal failure (HCC) 07/16/2013   Arthritis    hands, back, knees   Colitis 07/16/2013   Diverticulitis    Elevated CEA 08/15/2017   GERD (gastroesophageal reflux disease)    GIB (gastrointestinal bleeding) 01/07/2012   Hypertension    somewhat controlled, taking medication   Hypothyroidism    Intractable nausea and vomiting 05/30/2022   Lesion of epiglottis    Macrocytosis without anemia 08/15/2017   Orthostasis 08/22/2017   Past Surgical History:  Past Surgical History:  Procedure Laterality Date   ABDOMINAL HYSTERECTOMY     APPENDECTOMY     CHOLECYSTECTOMY     COLONOSCOPY  01/09/2012   Procedure: COLONOSCOPY;  Surgeon: Shirley Friar, MD;  Location: [facility] ENDOSCOPY;  Service: Endoscopy;  Laterality: N/A;   COLONOSCOPY WITH PROPOFOL N/A 08/25/2017   Procedure: COLONOSCOPY WITH PROPOFOL;  Surgeon: Wyline Mood, MD;  Location: [facility] ENDOSCOPY;  Service: Gastroenterology;  Laterality: N/A;   ESOPHAGOGASTRODUODENOSCOPY  01/07/2012   Procedure: ESOPHAGOGASTRODUODENOSCOPY (EGD);  Surgeon: Vertell Novak., MD;  Location: [facility] ENDOSCOPY;  Service: Endoscopy;  Laterality: N/A;   ESOPHAGOGASTRODUODENOSCOPY (EGD) WITH PROPOFOL N/A 08/25/2017   Procedure: ESOPHAGOGASTRODUODENOSCOPY (EGD) WITH PROPOFOL;  Surgeon: Wyline Mood, MD;  Location: [facility] ENDOSCOPY;  Service: Gastroenterology;  Laterality: N/A;   VIDEO BRONCHOSCOPY Bilateral 05/14/2016   Procedure: VIDEO BRONCHOSCOPY WITHOUT FLUORO;  Surgeon: Leslye Peer, MD;  Location: [facility] ENDOSCOPY;  Service: Cardiopulmonary;  Laterality: Bilateral;   HPI:  Pt is a 62 y.o. female with medical  history significant of HTN, CKD stage 3, IDA, HLD, hypothyroidism, Polysubstance Abuse, hx of GI bleed, Colitis, Diverticulitis, GERD who was brought in by EMS for cough for past 3 weeks with diarrhea, lower abdominal pain. Thinks she has COPD. Has not smoked tobacco in 4 months. Cough keeps her up non-bloody sputum. Has shortness of breath but no wheezing.  No fever but experiencing chills. Has dry-heeves due to coughing. She has not been eating well in recent Months since having Teeth Extraction in Feb 2024 (per pt report) -- all Teeth were removed except 5-6.  She now has an Upper Partial which is ill-fitting and "cuts into my gum".  She endorses a "VERY PAINFUL" mouth.  She has lost about 20 lbs since having the dental problems and has not been eating much for the past 6 weeks per her report.  She feels her mouth has "not healed" since having her Teeth extracted.  CXR this admit: patchy bilateral airspace disease.  Last CXR Imagning was in 2023.  Had admit for abdominal pain in 05/2022.    Assessment / Plan / Recommendation  Clinical Impression   Pt seen today for BSE post report of "sore mouth since I had my Teeth removed in Feb (2024)" impacting her oral intake/swallowing. Pt awake, verbal and communicated adequately w/ this SLP describing her oral pain and discomfort impacting her swallowing. She was mildly distracted at times sitting EOB. Pt was able to feed herself. She was not interested in much po's d/t oral discomfort. NSG aware.    Pt appears to present w/ grossly functional oropharyngeal phase swallowing w/ odynophagia but No oropharyngeal phase neuromuscular deficits noted.  Pt consumed po trials w/ No immediate, overt clinical s/s of aspiration during po trials.  Pt appears at reduced risk for aspiration following general aspiration precautions w/ a modified diet at this time d/t Dental issues/oral discomfort.   However, pt does have challenging factors that could impact her oropharyngeal  swallowing to include Pain/discomfort(NSG aware) of her oral cavity (since having Dentition removed), fatigue/weakness, and illness/hospitalization. Pt also has reduced oral intake overall d/t the Dental issues. These factors can increase risk for aspiration, dysphagia as well as decreased oral intake overall.   During po trials, pt consumed consistencies given w/ no overt coughing, decline in vocal quality, or change in respiratory presentation during/post trials. O2 sats remained 96-07%. Oral phase appeared grossly [facility] w/ timely bolus management and control of bolus propulsion for A-P transfer for swallowing. However, pt was mildly agitated w/ discomfort when swallowing - odynophagia. Mastication effort and time was increased w/ trials of soft solids. Oral clearing achieved w/ all trial consistencies. Liquids and loose purees appeared easiest for pt to manage orally.  OM Exam appeared [facility] w/ no unilateral weakness noted. Speech Clear. Noted redness and tenderness throughout oral cavity - painful to touch of tongue blase per pt. Pt fed self w/ setup support.   Recommend a more Pureed consistency diet w/ well-moistened foods until oral cavity health can be addressed and improves; Thin liquids -- less straw use for air swallowed. Recommend general aspiration precautions, tray setup and support at meals as needed. Pills CRUSHED in Puree for safer, easier swallowing d/t oral discomfort.  Recommend f/u w/ Dental consult to address upper partial ill-fit; oral health overall. Recommend an oral rinse to address healing of oropharynx secondary to the odynophagia; less numbing sprays just addressing pain used d/t the numbing effect and risk for aspiration during swallowing.  Diet can be upgraded by MD to soft solids when pt's oral cavity has healed for comfort of swallowing and pt is ready to wear the upper partial Denture plate.   Education given on Pills in Puree; food consistencies and options; general aspiration  and reflux precautions to pt and NSG. No further skilled ST services indicated. MD/NSG to reconsult if any new needs arise. NSG updated, agreed. MD updated.  Strongly recommend a Dental consult and f/u; a Dietician consult f/u for support. SLP Visit Diagnosis: Dysphagia, oral phase (R13.11) (d/t oral discomfort from Dental issues)    Aspiration Risk   (reduced when following general aspiration precautions)    Diet Recommendation   a more Pureed consistency diet w/ well-moistened foods until oral cavity health can be addressed and improves; Thin liquids -- less straw use for air swallowed. Recommend general aspiration and reflux precautions, tray setup and support at meals as needed.   Medication Administration: Crushed with puree (for ease of swallowing)    Other  Recommendations Recommended Consults:  (Dental Consult, tx; Dietician f/u) Oral Care Recommendations: Oral care BID;Oral care before and after PO;Staff/trained caregiver to provide oral care (setup and support- Denture care)    Recommendations for follow up therapy are one component of a multi-disciplinary discharge planning process, led by the attending physician.  Recommendations may be updated based on patient status, additional functional criteria and insurance authorization.  Follow up Recommendations No SLP follow up      Assistance Recommended at Discharge  Intermittent-prn  Functional Status Assessment Patient has had a recent decline in their functional status and demonstrates the ability to make significant improvements in function in a reasonable and  predictable amount of time.  Frequency and Duration  (n/a)   (n/a)       Prognosis Prognosis for improved oropharyngeal function: Fair (-Good) Barriers to Reach Goals: Time post onset;Severity of deficits;Behavior Barriers/Prognosis Comment: Dental/gum issues and discomfort; deconditioning and poor appetite      Swallow Study   General Date of Onset: 11/09/22 HPI:  Pt is a 62 y.o. female with medical history significant of HTN, CKD stage 3, IDA, HLD, hypothyroidism, Polysubstance Abuse, hx of GI bleed, Colitis, Diverticulitis, GERD who was brought in by EMS for cough for past 3 weeks with diarrhea, lower abdominal pain. Thinks she has COPD. Has not smoked tobacco in 4 months. Cough keeps her up non-bloody sputum. Has shortness of breath but no wheezing.  No fever but experiencing chills. Has dry-heeves due to coughing. She has not been eating well in recent Months since having Teeth Extraction in Feb 2024 (per pt report) -- all Teeth were removed except 5-6.  She now has an Upper Partial which is ill-fitting and "cuts into my gum".  She endorses a "VERY PAINFUL" mouth.  She has lost about 20 lbs since having the dental problems and has not been eating much for the past 6 weeks per her report.  She feels her mouth has "not healed" since having her Teeth extracted.  CXR this admit: patchy bilateral airspace disease.  Last CXR Imagning was in 2023.  Had admit for abdominal pain in 05/2022. Type of Study: Bedside Swallow Evaluation Previous Swallow Assessment: none Diet Prior to this Study: Regular;Thin liquids (Level 0) Temperature Spikes Noted: No (wbc 12.5 trending down.) Respiratory Status: Room air History of Recent Intubation: No Behavior/Cognition: Alert;Cooperative;Pleasant mood;Distractible;Requires cueing Oral Cavity Assessment: Erythema (uncomfortable; painful to touch. No apparent breaks in skin, nor bleeding. Partial plate removed.) Oral Care Completed by SLP: Yes (but briefly d/t tenderness and discomfort) Oral Cavity - Dentition: Edentulous (partial upper plate removed fter) Vision: Functional for self-feeding Self-Feeding Abilities: Able to feed self;Needs set up Patient Positioning: Upright in bed (EOB) Baseline Vocal Quality: Normal Volitional Cough: Strong;Congested (min) Volitional Swallow: Able to elicit    Oral/Motor/Sensory Function Overall  Oral Motor/Sensory Function: Within functional limits   Ice Chips Ice chips: Within functional limits Presentation: Spoon (fed; 3 trials) Other Comments: oral discomfort   Thin Liquid Thin Liquid: Within functional limits Presentation: Cup;Self Fed;Straw (5 trials via each method) Other Comments: oral discomfort    Nectar Thick Nectar Thick Liquid: Not tested   Honey Thick Honey Thick Liquid: Not tested   Puree Puree: Within functional limits Presentation: Self Fed;Spoon (5 trials) Other Comments: oral discomfort   Solid     Solid: Impaired (min - missing lower dentition for mastication; discomfort) Presentation: Self Fed (2 trials) Oral Phase Impairments: Impaired mastication Oral Phase Functional Implications: Prolonged oral transit;Impaired mastication Pharyngeal Phase Impairments:  (none) Other Comments: oral discomfort        Jerilynn Som, MS, CCC-SLP Speech Language Pathologist Rehab Services; [facility] [phone] (ascom) Tambria Pfannenstiel 11/10/2022,5:44 PM

## 2022-11-11 DIAGNOSIS — R6520 Severe sepsis without septic shock: Secondary | ICD-10-CM | POA: Diagnosis not present

## 2022-11-11 DIAGNOSIS — J13 Pneumonia due to Streptococcus pneumoniae: Secondary | ICD-10-CM | POA: Diagnosis not present

## 2022-11-11 DIAGNOSIS — A419 Sepsis, unspecified organism: Secondary | ICD-10-CM | POA: Diagnosis not present

## 2022-11-11 LAB — URINE DRUG SCREEN, QUALITATIVE (ARMC ONLY)
Amphetamines, Ur Screen: NOT DETECTED
Barbiturates, Ur Screen: NOT DETECTED
Benzodiazepine, Ur Scrn: NOT DETECTED
Cannabinoid 50 Ng, Ur ~~LOC~~: NOT DETECTED
Cocaine Metabolite,Ur ~~LOC~~: NOT DETECTED
MDMA (Ecstasy)Ur Screen: NOT DETECTED
Methadone Scn, Ur: NOT DETECTED
Opiate, Ur Screen: POSITIVE — AB
Phencyclidine (PCP) Ur S: NOT DETECTED
Tricyclic, Ur Screen: POSITIVE — AB

## 2022-11-11 LAB — BASIC METABOLIC PANEL
Anion gap: 8 (ref 5–15)
BUN: 26 mg/dL — ABNORMAL HIGH (ref 6–20)
CO2: 20 mmol/L — ABNORMAL LOW (ref 22–32)
Calcium: 8.1 mg/dL — ABNORMAL LOW (ref 8.9–10.3)
Chloride: 112 mmol/L — ABNORMAL HIGH (ref 98–111)
Creatinine, Ser: 1.59 mg/dL — ABNORMAL HIGH (ref 0.44–1.00)
GFR, Estimated: 37 mL/min — ABNORMAL LOW (ref >60)
Glucose, Bld: 91 mg/dL (ref 70–99)
Potassium: 4.6 mmol/L (ref 3.5–5.1)
Sodium: 140 mmol/L (ref 135–145)

## 2022-11-11 LAB — CBC WITH DIFFERENTIAL/PLATELET
Abs Immature Granulocytes: 0.56 10*3/uL — ABNORMAL HIGH (ref 0.00–0.07)
Basophils Absolute: 0.1 10*3/uL (ref 0.0–0.1)
Basophils Relative: 0 %
Eosinophils Absolute: 0 10*3/uL (ref 0.0–0.5)
Eosinophils Relative: 0 %
HCT: 24.7 % — ABNORMAL LOW (ref 36.0–46.0)
Hemoglobin: 7.9 g/dL — ABNORMAL LOW (ref 12.0–15.0)
Immature Granulocytes: 2 %
Lymphocytes Relative: 4 %
Lymphs Abs: 1 10*3/uL (ref 0.7–4.0)
MCH: 31.6 pg (ref 26.0–34.0)
MCHC: 32 g/dL (ref 30.0–36.0)
MCV: 98.8 fL (ref 80.0–100.0)
Monocytes Absolute: 1.3 10*3/uL — ABNORMAL HIGH (ref 0.1–1.0)
Monocytes Relative: 5 %
Neutro Abs: 23.7 10*3/uL — ABNORMAL HIGH (ref 1.7–7.7)
Neutrophils Relative %: 89 %
Platelets: 531 10*3/uL — ABNORMAL HIGH (ref 150–400)
RBC: 2.5 MIL/uL — ABNORMAL LOW (ref 3.87–5.11)
RDW: 17.4 % — ABNORMAL HIGH (ref 11.5–15.5)
Smear Review: NORMAL
WBC: 26.7 10*3/uL — ABNORMAL HIGH (ref 4.0–10.5)
nRBC: 0 % (ref 0.0–0.2)

## 2022-11-11 LAB — HIV ANTIBODY (ROUTINE TESTING W REFLEX): HIV Screen 4th Generation wRfx: NONREACTIVE

## 2022-11-11 LAB — C DIFFICILE QUICK SCREEN W PCR REFLEX
C Diff antigen: NEGATIVE
C Diff interpretation: NOT DETECTED
C Diff toxin: NEGATIVE

## 2022-11-11 LAB — LEGIONELLA PNEUMOPHILA SEROGP 1 UR AG: L. pneumophila Serogp 1 Ur Ag: NEGATIVE

## 2022-11-11 LAB — CULTURE, BLOOD (ROUTINE X 2)
Culture: NO GROWTH
Special Requests: ADEQUATE

## 2022-11-11 LAB — CULTURE, RESPIRATORY W GRAM STAIN

## 2022-11-11 LAB — PHOSPHORUS: Phosphorus: 2.9 mg/dL (ref 2.5–4.6)

## 2022-11-11 LAB — MAGNESIUM: Magnesium: 2 mg/dL (ref 1.7–2.4)

## 2022-11-11 MED ORDER — MAGIC MOUTHWASH
10.0000 mL | Freq: Four times a day (QID) | ORAL | Status: DC | PRN
  Administered 2022-11-11 – 2022-11-13 (×4): 10 mL via ORAL
  Filled 2022-11-11 (×5): qty 10

## 2022-11-11 NOTE — Progress Notes (Signed)
Initial Nutrition Assessment  DOCUMENTATION CODES:   Not applicable  INTERVENTION:  Continue Dysphagia 1 diet, as recommended by ST for ease of chewing given poor dentition Discontinue Ensure Max Ensure Enlive po TID, each supplement provides 350 kcal and 20 grams of protein. Magic cup TID with meals, each supplement provides 290 kcal and 9 grams of protein MVI with minerals daily "High Calorie, High Protein Nutrition Therapy" handout added to AVS  NUTRITION DIAGNOSIS:   Inadequate oral intake related to inability to eat (dental extractions, odynophagia) as evidenced by per patient/family report.  GOAL:   Patient will meet greater than or equal to 90% of their needs  MONITOR:   PO intake, Supplement acceptance, Labs, Weight trends, Diet advancement  REASON FOR ASSESSMENT:   Consult Assessment of nutrition requirement/status  ASSESSMENT:   Pt admitted from home with c/o 3 weeks of diarrhea and lower abdominal pain found to have severe sepsis 2/2 CAP. PMH significant for HTN, CKD stage 3, IDA, HLD, hypothyroidism, polysubstance use, diverticulitis and GIB.   RD working remotely. Attempted to reach pt via phone call to room. Multiple rings however no answer.   Per H&P, pt has not been eating well for the last 6 weeks after having most of her teeth pulled out and has lost about 20 lbs.  No documented meal completions on file to review. Discussed with RN who reports pt not eating much d/t pain with eating. She is consuming Ensure and [employer]. MD ordered magic mouthwash to help with mouth pain.   Unfortunately, there is limited documentation of weight history on file to review within the last 2 years. Pt's weight on 05/30/22 was documented to be 40.8 kg. Current weight is 42.3 kg. Will continue to monitor trends throughout admission.    Medications: ferrous sulfate, MVI, prednisone, IV abx  Labs: BUN 26, Cr 1.59, GFR 37  NUTRITION - FOCUSED PHYSICAL EXAM: RD working  remotely. Deferred to follow up.   Diet Order:   Diet Order             DIET - DYS 1 Room service appropriate? Yes with Assist; Fluid consistency: Thin  Diet effective now                   EDUCATION NEEDS:   Not appropriate for education at this time  Skin:  Skin Assessment: Reviewed RN Assessment  Last BM:  unknown  Height:   Ht Readings from Last 1 Encounters:  05/30/22 4\' 10"  (1.473 m)    Weight:   Wt Readings from Last 1 Encounters:  11/11/22 42.3 kg   BMI:  Body mass index is 19.48 kg/m.  Estimated Nutritional Needs:   Kcal:  1200-1400  Protein:  60-75g  Fluid:  1.2-1.4L  Drusilla Kanner, RDN, LDN Clinical Nutrition

## 2022-11-11 NOTE — Discharge Instructions (Signed)

## 2022-11-11 NOTE — Progress Notes (Addendum)
Progress Note    [name]  ZOX:[id number] DOB: May 18, 1961  DOA: 11/09/2022 PCP: [facility]      Brief Narrative:    Medical records reviewed and are as summarized below:  [name] is a 62 y.o. female with medical history significant for COPD, hypertension, CKD stage IIIb, iron deficiency anemia, hyperlipidemia, hypothyroidism, polysubstance use disorder, history of GI bleed, history of diverticulitis who presented to the hospital with cough, shortness of breath, diarrhea, lower abdominal pain for about 3 weeks.    She was admitted to the hospital for severe sepsis secondary to pneumococcal pneumonia.   Assessment/Plan:   Principal Problem:   Severe sepsis (HCC) Active Problems:   Substance use disorder   Pneumococcal pneumonia (HCC)   AKI (acute kidney injury) (HCC)   Acute respiratory failure with hypoxia (HCC)   Stage 3 chronic kidney disease (HCC)   Chronic obstructive pulmonary disease (HCC)   Abdominal pain   Diarrhea   Iron deficiency anemia   Body mass index is 19.48 kg/m.   Severe sepsis secondary to pneumococcal pneumonia: Lactic acid was 2.8, WBC 24.7, heart rate 123, respiratory rate 23 on admission. Worsening leukocytosis, up to 26.7.  Strep urinary antigen was positive.  Continue IV ceftriaxone   AKI on CKD stage IIIb: Creatinine improved to baseline.   Acute hypoxic respiratory failure: She is tolerating room air at rest.   Use oxygen as needed.  She may also have underlying chronic hypoxic respiratory failure from COPD.   Lower abdominal pain, history of chronic abdominal pain, chronic diarrhea and diverticulitis: No acute abnormality on CT abdomen/pelvis.   Diarrhea: Improved.  C. diff negative   Polysubstance use disorder: Urine drug screen positive for opiates and tricyclics   Oral thrush, dysphagia: Patient prefers magic mouth wash to nystatin swish and swallow. Speech tx recommended dysphagia 1 diet   Left  leg ulcers: Apply wet-to-dry dressing daily.   Iron deficiency anemia, acute on chronic anemia: Hemoglobin is down from 9.5-7.4.  Monitor H&H and transfuse PRBCs as needed.  Iron 9, TIBC 102, saturation ratio 9 and ferritin 119.  Elevated Vitamin( 2,141)     Diet Order             DIET - DYS 1 Room service appropriate? Yes with Assist; Fluid consistency: Thin  Diet effective now                            Consultants: None  Procedures: None    Medications:    enoxaparin (LOVENOX) injection  30 mg Subcutaneous Q24H   feeding supplement  237 mL Oral TID BM   ferrous sulfate  325 mg Oral QODAY   guaiFENesin  600 mg Oral BID   levothyroxine  50 mcg Oral Q0600   multivitamin with minerals  1 tablet Oral Daily   predniSONE  40 mg Oral Q breakfast   rosuvastatin  5 mg Oral QPM   traZODone  50-100 mg Oral QHS   Continuous Infusions:  cefTRIAXone (ROCEPHIN)  IV 2 g (11/11/22 1345)     Anti-infectives (From admission, onward)    Start     Dose/Rate Route Frequency Ordered Stop   11/09/22 1330  cefTRIAXone (ROCEPHIN) 2 g in sodium chloride 0.9 % 100 mL IVPB        2 g 200 mL/hr over 30 Minutes Intravenous Every 24 hours 11/09/22 1328 11/14/22 1329   11/09/22 1330  azithromycin (ZITHROMAX) 500 mg in sodium chloride 0.9 % 250 mL IVPB  Status:  Discontinued        500 mg 250 mL/hr over 60 Minutes Intravenous Every 24 hours 11/09/22 1328 11/10/22 1249              Family Communication/Anticipated D/C date and plan/Code Status   DVT prophylaxis: enoxaparin (LOVENOX) injection 30 mg Start: 11/10/22 2200     Code Status: Full Code  Family Communication: Plan discussed with her sister and her boyfriend were at the bedside Disposition Plan: Plan to discharge home in 2 to 3 days   Status is: Inpatient Remains inpatient appropriate because: Pneumonia, acute hypoxic respiratory failure       Subjective:   Interval events noted.  She complains of  cough and  shortness of breath that is worse with exertion. No diarrhea. Her boyfriend and Jasmine December ,sister, were at the bedside   Objective:    Vitals:   11/11/22 0336 11/11/22 0751 11/11/22 1127 11/11/22 1345  BP: 123/61 132/70 (!) 148/77   Pulse: (!) 102 (!) 108 99   Resp: 18 20 18 16   Temp: 98.4 F (36.9 C) 98.1 F (36.7 C) 98 F (36.7 C)   TempSrc:      SpO2: 95% 100% 98%   Weight:  42.3 kg     No data found.   Intake/Output Summary (Last 24 hours) at 11/11/2022 1513 Last data filed at 11/11/2022 1041 Gross per 24 hour  Intake 460 ml  Output 401 ml  Net 59 ml   Filed Weights   11/09/22 1627 11/11/22 0751  Weight: 44.2 kg 42.3 kg    Exam:  GEN: NAD SKIN: Warm and dry EYES: EOMI ENT: MMM, torus on the roof of the mouth, white patches on the tongues, ?thrush CV: RRR PULM: Decreased air entry bilaterally, bibasilar rales ABD: soft, ND, mild lower abdominal tenderness, +BS CNS: AAO x 3, non focal EXT: No edema or tenderness          Data Reviewed:   I have personally reviewed following labs and imaging studies:  Labs: Labs show the following:   Basic Metabolic Panel: Recent Labs  Lab 11/09/22 1308 11/10/22 0530 11/11/22 0618  NA 138 139 140  K 4.0 4.2 4.6  CL 105 113* 112*  CO2 20* 20* 20*  GLUCOSE 91 138* 91  BUN 25* 19 26*  CREATININE 2.32* 1.73* 1.59*  CALCIUM 8.5* 7.5* 8.1*  MG  --   --  2.0  PHOS  --   --  2.9   GFR Estimated Creatinine Clearance: 24.3 mL/min (A) (by C-G formula based on SCr of 1.59 mg/dL (H)). Liver Function Tests: Recent Labs  Lab 11/09/22 1308 11/10/22 0530  AST 31 20  ALT 18 14  ALKPHOS 141* 183*  BILITOT 0.6 0.3  PROT 5.8* 4.4*  ALBUMIN 2.1* 1.6*   Recent Labs  Lab 11/09/22 1308  LIPASE 39   No results for input(s): "AMMONIA" in the last 168 hours. Coagulation profile Recent Labs  Lab 11/09/22 1308  INR 1.2    CBC: Recent Labs  Lab 11/09/22 1308 11/10/22 0530 11/11/22 0618  WBC 24.7*  12.5* 26.7*  NEUTROABS  --   --  23.7*  HGB 9.5* 7.4* 7.9*  HCT 30.0* 23.2* 24.7*  MCV 98.0 98.3 98.8  PLT 588* 473* 531*   Cardiac Enzymes: No results for input(s): "CKTOTAL", "CKMB", "CKMBINDEX", "TROPONINI" in the last 168 hours. BNP (last 3 results) No results for  input(s): "PROBNP" in the last 8760 hours. CBG: No results for input(s): "GLUCAP" in the last 168 hours. D-Dimer: No results for input(s): "DDIMER" in the last 72 hours. Hgb A1c: No results for input(s): "HGBA1C" in the last 72 hours. Lipid Profile: No results for input(s): "CHOL", "HDL", "LDLCALC", "TRIG", "CHOLHDL", "LDLDIRECT" in the last 72 hours. Thyroid function studies: No results for input(s): "TSH", "T4TOTAL", "T3FREE", "THYROIDAB" in the last 72 hours.  Invalid input(s): "FREET3" Anemia work up: Recent Labs    11/10/22 0527 11/10/22 1025  VITAMINB12  --  2,141*  FOLATE 8.4  --   FERRITIN 119  --   TIBC 102*  --   IRON 9*  --    Sepsis Labs: Recent Labs  Lab 11/09/22 1308 11/09/22 1531 11/10/22 0530 11/11/22 0618  WBC 24.7*  --  12.5* 26.7*  LATICACIDVEN 2.8* 2.0* 1.3  --     Microbiology Recent Results (from the past 240 hour(s))  Blood culture (routine x 2)     Status: None (Preliminary result)   Collection Time: 11/09/22  1:08 PM   Specimen: BLOOD  Result Value Ref Range Status   Specimen Description BLOOD BLOOD LEFT ARM  Final   Special Requests   Final    BOTTLES DRAWN AEROBIC AND ANAEROBIC Blood Culture adequate volume   Culture   Final    NO GROWTH 2 DAYS Performed at [facility], [address] [place], [place] [address]    Report Status PENDING  Incomplete  Blood culture (routine x 2)     Status: None (Preliminary result)   Collection Time: 11/09/22  1:35 PM   Specimen: BLOOD  Result Value Ref Range Status   Specimen Description BLOOD RIGHT ANTECUBITAL  Final   Special Requests   Final    BOTTLES DRAWN AEROBIC AND ANAEROBIC Blood Culture adequate volume    Culture   Final    NO GROWTH 2 DAYS Performed at [facility], [address], [place], [place] [address]    Report Status PENDING  Incomplete  Resp panel by RT-PCR (RSV, Flu A&B, Covid) Anterior Nasal Swab     Status: None   Collection Time: 11/09/22  3:41 PM   Specimen: Anterior Nasal Swab  Result Value Ref Range Status   SARS Coronavirus 2 by RT PCR NEGATIVE NEGATIVE Final    Comment: (NOTE) SARS-CoV-2 target nucleic acids are NOT DETECTED.  The SARS-CoV-2 RNA is generally detectable in upper respiratory specimens during the acute phase of infection. The lowest concentration of SARS-CoV-2 viral copies this assay can detect is 138 copies/mL. A negative result does not preclude SARS-Cov-2 infection and should not be used as the sole basis for treatment or other patient management decisions. A negative result may occur with  improper specimen collection/handling, submission of specimen other than nasopharyngeal swab, presence of viral mutation(s) within the areas targeted by this assay, and inadequate number of viral copies(<138 copies/mL). A negative result must be combined with clinical observations, patient history, and epidemiological information. The expected result is Negative.  Fact Sheet for Patients:  BloggerCourse.com  Fact Sheet for Healthcare Providers:  SeriousBroker.it  This test is no t yet approved or cleared by the [place] FDA and  has been authorized for detection and/or diagnosis of SARS-CoV-2 by FDA under an Emergency Use Authorization (EUA). This EUA will remain  in effect (meaning this test can be used) for the duration of the COVID-19 declaration under Section 564(b)(1) of the Act, 21 U.S.C.section 360bbb-3(b)(1), unless  the authorization is terminated  or revoked sooner.       Influenza A by PCR NEGATIVE NEGATIVE Final   Influenza B by PCR NEGATIVE NEGATIVE Final    Comment:  (NOTE) The Xpert Xpress SARS-CoV-2/FLU/RSV plus assay is intended as an aid in the diagnosis of influenza from Nasopharyngeal swab specimens and should not be used as a sole basis for treatment. Nasal washings and aspirates are unacceptable for Xpert Xpress SARS-CoV-2/FLU/RSV testing.  Fact Sheet for Patients: BloggerCourse.com  Fact Sheet for Healthcare Providers: SeriousBroker.it  This test is not yet approved or cleared by the [place] FDA and has been authorized for detection and/or diagnosis of SARS-CoV-2 by FDA under an Emergency Use Authorization (EUA). This EUA will remain in effect (meaning this test can be used) for the duration of the COVID-19 declaration under Section 564(b)(1) of the Act, 21 U.S.C. section 360bbb-3(b)(1), unless the authorization is terminated or revoked.     Resp Syncytial Virus by PCR NEGATIVE NEGATIVE Final    Comment: (NOTE) Fact Sheet for Patients: BloggerCourse.com  Fact Sheet for Healthcare Providers: SeriousBroker.it  This test is not yet approved or cleared by the [place] FDA and has been authorized for detection and/or diagnosis of SARS-CoV-2 by FDA under an Emergency Use Authorization (EUA). This EUA will remain in effect (meaning this test can be used) for the duration of the COVID-19 declaration under Section 564(b)(1) of the Act, 21 U.S.C. section 360bbb-3(b)(1), unless the authorization is terminated or revoked.  Performed at [facility], [address] [place], [place] [address]   MRSA Next Gen by PCR, Nasal     Status: None   Collection Time: 11/10/22 10:25 AM   Specimen: Nasal Mucosa; Nasal Swab  Result Value Ref Range Status   MRSA by PCR Next Gen NOT DETECTED NOT DETECTED Final    Comment: (NOTE) The GeneXpert MRSA Assay (FDA approved for NASAL specimens only), is one component of a comprehensive MRSA  colonization surveillance program. It is not intended to diagnose MRSA infection nor to guide or monitor treatment for MRSA infections. Test performance is not FDA approved in patients less than 36 years old. Performed at [facility], [address] [place], [place] [address]   Expectorated Sputum Assessment w Gram Stain, Rflx to Resp Cult     Status: None   Collection Time: 11/10/22 10:25 AM   Specimen: Expectorated Sputum  Result Value Ref Range Status   Specimen Description EXPECTORATED SPUTUM  Final   Special Requests NONE  Final   Sputum evaluation   Final    THIS SPECIMEN IS ACCEPTABLE FOR SPUTUM CULTURE Performed at [facility], [address], [place], [place] [address]    Report Status 11/10/2022 FINAL  Final  Culture, Respiratory w Gram Stain     Status: None (Preliminary result)   Collection Time: 11/10/22 10:25 AM  Result Value Ref Range Status   Specimen Description   Final    EXPECTORATED SPUTUM Performed at [facility], [address] [place], [place] [address]    Special Requests   Final    NONE Reflexed from [id number] Performed at [facility], [address] [place], [place] [address]    Gram Stain   Final    ABUNDANT WBC PRESENT, PREDOMINANTLY PMN ABUNDANT YEAST WITH PSEUDOHYPHAE    Culture   Final    FEW YEAST CULTURE REINCUBATED FOR BETTER GROWTH Performed at [facility], [address] [place], [place] [address]  Report Status PENDING  Incomplete  C Difficile Quick Screen w PCR reflex     Status: None   Collection Time: 11/11/22 12:50 PM  Result Value Ref Range Status   C Diff antigen NEGATIVE NEGATIVE Final   C Diff toxin NEGATIVE NEGATIVE Final   C Diff interpretation No C. difficile detected.  Final    Comment: Performed at [facility], [address] [place], [place] [address]    Procedures and diagnostic studies:  CT ABDOMEN PELVIS WO CONTRAST  Result Date:  11/10/2022 CLINICAL DATA:  Pain lower abdomen EXAM: CT ABDOMEN AND PELVIS WITHOUT CONTRAST TECHNIQUE: Multidetector CT imaging of the abdomen and pelvis was performed following the standard protocol without IV contrast. RADIATION DOSE REDUCTION: This exam was performed according to the departmental dose-optimization program which includes automated exposure control, adjustment of the mA and/or kV according to patient size and/or use of iterative reconstruction technique. COMPARISON:  05/30/2022 FINDINGS: Lower chest: New small patchy infiltrates are seen in right middle lobe, lingula and both lower lobes. Small bilateral pleural effusions are seen, more so on the right side. Coronary artery calcifications are seen. Hepatobiliary: There is small focus of low-density in the liver close to the falciform ligament with no change, possibly aberrant venous drainage or focal fatty infiltration. Surgical clips are seen in gallbladder fossa. There is no dilation of bile ducts. Pancreas: No focal abnormalities are seen. Spleen: Unremarkable. Adrenals/Urinary Tract: Adrenals are unremarkable. There is no hydronephrosis. There are foci of cortical thinning in the kidneys, more so on the left side. There are no renal or ureteral stones. There is 1.6 cm fluid density lesion in the medial midportion of right kidney suggesting renal cyst. Urinary bladder is unremarkable. Stomach/Bowel: Stomach is moderately distended with fluid There is no wall thickening in stomach. Small bowel loops are not dilated. Small bowel loops are mostly in right side of abdomen suggesting malrotation. There is no small bowel dilation. Appendix is not seen. Scattered diverticula are seen in colon. There is no evidence of focal acute diverticulitis. Vascular/Lymphatic: Arterial calcifications are seen. Reproductive: Uterus is not seen. There is questionable trace amount of free fluid in pelvis. Other: There is no pneumoperitoneum. There is mild edema in  subcutaneous plane without any loculated fluid collections. Musculoskeletal: Degenerative changes are noted in lumbar spine, more so at L3-L4 level. IMPRESSION: There is no evidence of intestinal obstruction or pneumoperitoneum. There is no hydronephrosis. New patchy alveolar infiltrates are seen in both lower lung fields suggesting possible multifocal pneumonia. Small bilateral pleural effusions. Scattered diverticula are seen in colon without signs of focal acute diverticulitis. Aortic arteriosclerosis. Scattered coronary artery calcifications are seen. Right renal cyst. Lumbar spondylosis. Questionable trace amount of free fluid is seen in pelvis. This may be an artifact or suggest minimal ascites due to nonspecific enteritis. There is mild subcutaneous edema without any loculated fluid collections. Electronically Signed   By: Ernie Avena M.D.   On: 11/10/2022 14:22               LOS: 2 days   Chastin Riesgo  [facility]   Pager on [email or web address]. If 7PM-7AM, please contact night-coverage at [email or web address]     11/11/2022, 3:13 PM

## 2022-11-11 NOTE — Progress Notes (Signed)
At bedside to place PI.  Pt requests to wait until tomorrow to place IV. She states she will let us know when she is ready for it.  Will notify RN and MD via securechat.

## 2022-11-11 NOTE — Progress Notes (Signed)
Mobility Specialist - Progress Note   11/11/22 1016  Mobility  Activity Ambulated with assistance in hallway;Ambulated with assistance to bathroom;Repositioned in chair  Level of Assistance Standby assist, set-up cues, supervision of patient - no hands on  Assistive Device Front wheel walker  Distance Ambulated (ft) 20 ft  Activity Response Tolerated well  $Mobility charge 1 Mobility  Mobility Specialist Start Time (ACUTE ONLY) 1000  Mobility Specialist Stop Time (ACUTE ONLY) 1014  Mobility Specialist Time Calculation (min) (ACUTE ONLY) 14 min   MS responding to chair alarm. Pt sitting in the recliner upon entry, requesting to use the bathroom. Pt STS to RW and amb to the bathroom CGA-SBA. Pt returned to the recliner, left with alarm set and needs within reach.   Zetta Bills Mobility Specialist 11/11/22 10:19 AM

## 2022-11-11 NOTE — TOC Initial Note (Signed)
Transition of Care [facility]) - Initial/Assessment Note    Patient Details  Name: [name] MRN: [id number] Date of Birth: 1961/04/24  [facility] CM/SW Contact:    Margarito Liner, LCSW Phone Number: 11/11/2022, 11:38 AM  Clinical Narrative:   Readmission prevention screen complete. CSW called patient in the room, introduced role, and explained that discharge planning would be discussed. PCP is at [facility]. She does not see a specific provider there. [facility] [place]. No issues obtaining medications. Patient lives with her fiance'. No home health prior to admission but she said she would like to have it. Explained that PT and OT determined no needs. Also told her that commercial insurance typically requires copay of over $100 per visit. Patient stated she has 3-4 canes and a BSC at home. No further concerns. CSW encouraged patient to contact CSW as needed. CSW will continue to follow patient for support and facilitate return home once stable. Her fiance' will transport her home at discharge.         Expected Discharge Plan: Home/Self Care Barriers to Discharge: Continued Medical Work up   Patient Goals and CMS Choice            Expected Discharge Plan and Services     Post Acute Care Choice: NA Living arrangements for the past 2 months: Single Family Home                                      Prior Living Arrangements/Services Living arrangements for the past 2 months: Single Family Home Lives with:: Significant Other Patient language and need for interpreter reviewed:: Yes Do you feel safe going back to the place where you live?: Yes      Need for Family Participation in Patient Care: Yes (Comment) Care giver support system in place?: Yes (comment) Current home services: DME Criminal Activity/Legal Involvement Pertinent to Current Situation/Hospitalization: No - Comment as needed  Activities of Daily Living Home Assistive  Devices/Equipment: Dentures (specify type), Eyeglasses ADL Screening (condition at time of admission) Patient's cognitive ability adequate to safely complete daily activities?: Yes Is the patient deaf or have difficulty hearing?: No Does the patient have difficulty seeing, even when wearing glasses/contacts?: No Does the patient have difficulty concentrating, remembering, or making decisions?: No Patient able to express need for assistance with ADLs?: Yes Does the patient have difficulty dressing or bathing?: No Independently performs ADLs?: Yes (appropriate for developmental age) Does the patient have difficulty walking or climbing stairs?: No Weakness of Legs: Both Weakness of Arms/Hands: None  Permission Sought/Granted                  Emotional Assessment Appearance:: Appears stated age Attitude/Demeanor/Rapport: Engaged, Gracious Affect (typically observed): Accepting, Appropriate, Calm, Pleasant Orientation: : Oriented to Self, Oriented to Place, Oriented to  Time, Oriented to Situation Alcohol / Substance Use: Not Applicable Psych Involvement: No (comment)  Admission diagnosis:  Sepsis due to pneumonia (HCC) [J18.9, A41.9] Community acquired pneumonia, unspecified laterality [J18.9] Sepsis with acute renal failure and septic shock, due to unspecified organism, unspecified acute renal failure type (HCC) [A41.9, R65.21, N17.9] Patient Active Problem List   Diagnosis Date Noted   Pneumococcal pneumonia (HCC) 11/10/2022   Severe sepsis (HCC) 11/09/2022   Intractable nausea and vomiting 05/30/2022   Abdominal pain 05/30/2022   UTI (urinary tract infection) 05/30/2022   Diarrhea 05/30/2022  Gastroenteritis due to COVID-19 virus 05/30/2022   Dehydration 05/30/2022   Iron deficiency anemia 05/30/2022   Elevated lipase 05/30/2022   Mixed hyperlipidemia 05/30/2022   Abnormal drug screen (12/12/2019) 01/13/2020   Substance use disorder 01/13/2020   Long term prescription  benzodiazepine use 01/13/2020   Chronic, continuous use of opioids 01/13/2020   Amphetamine user 01/13/2020   Lumbar facet syndrome (Bilateral) (R>L) 12/12/2019   Chronic sacroiliac joint pain (Left) 12/12/2019   Chronic pain syndrome 12/11/2019   Pharmacologic therapy 12/11/2019   Disorder of skeletal system 12/11/2019   Problems influencing health status 12/11/2019   Benign hypertensive kidney disease with chronic kidney disease 11/07/2019   Insomnia 09/12/2019   Cramps of lower extremity 09/12/2019   Aortic atherosclerosis (HCC) 09/12/2019   GERD (gastroesophageal reflux disease) 09/12/2019   Acute respiratory failure with hypoxia (HCC) 06/05/2019   COPD with acute exacerbation (HCC) 06/05/2019   Acquired hypothyroidism 06/05/2019   Chronic low back pain (1ry area of Pain) (Bilateral) w/o sciatica 08/16/2018   Stage 3 chronic kidney disease (HCC) 08/16/2018   Insomnia due to other mental disorder 08/16/2018   Lesion of epiglottis 08/10/2018   Chronic abdominal pain 01/03/2018   Orthostasis 08/22/2017   Hypercalcemia 08/15/2017   Macrocytosis 08/15/2017   Lung nodules 05/07/2016   COPD (chronic obstructive pulmonary disease) (HCC) 05/07/2016   Chronic obstructive pulmonary disease (HCC) 05/07/2016   Essential hypertension 07/16/2013   Protein-calorie malnutrition, severe (HCC) 01/12/2013   AKI (acute kidney injury) (HCC) 01/11/2013   GI bleed 01/07/2012   Alcohol abuse 01/07/2012   Hypothyroidism 03/03/2010   PCP:  [facility]:   [id number] [address] [place] - [address] [place] [address] Phone: [phone] Fax: [phone]     Social Determinants of Health (SDOH) Social History: SDOH Screenings   Food Insecurity: No Food Insecurity (11/10/2022)  Housing: Low Risk  (11/10/2022)  Transportation Needs: No Transportation Needs (11/10/2022)  Utilities: Not At Risk (11/10/2022)  Depression (PHQ2-9): Low Risk   (12/12/2019)  Tobacco Use: High Risk (11/10/2022)   SDOH Interventions:     Readmission Risk Interventions    11/11/2022   11:36 AM  Readmission Risk Prevention Plan  Transportation Screening Complete  Medication Review (RN Care Manager) Complete  PCP or Specialist appointment within 3-5 days of discharge Complete  SW Recovery Care/Counseling Consult Complete  Palliative Care Screening Not Applicable  Skilled Nursing Facility Not Applicable

## 2022-11-12 DIAGNOSIS — R6520 Severe sepsis without septic shock: Secondary | ICD-10-CM | POA: Diagnosis not present

## 2022-11-12 DIAGNOSIS — A419 Sepsis, unspecified organism: Secondary | ICD-10-CM | POA: Diagnosis not present

## 2022-11-12 DIAGNOSIS — J13 Pneumonia due to Streptococcus pneumoniae: Secondary | ICD-10-CM | POA: Diagnosis not present

## 2022-11-12 LAB — CBC WITH DIFFERENTIAL/PLATELET
Abs Immature Granulocytes: 1.15 10*3/uL — ABNORMAL HIGH (ref 0.00–0.07)
Basophils Absolute: 0.1 10*3/uL (ref 0.0–0.1)
Basophils Relative: 0 %
Eosinophils Absolute: 0 10*3/uL (ref 0.0–0.5)
Eosinophils Relative: 0 %
HCT: 24.9 % — ABNORMAL LOW (ref 36.0–46.0)
Hemoglobin: 7.9 g/dL — ABNORMAL LOW (ref 12.0–15.0)
Immature Granulocytes: 6 %
Lymphocytes Relative: 11 %
Lymphs Abs: 2 10*3/uL (ref 0.7–4.0)
MCH: 31 pg (ref 26.0–34.0)
MCHC: 31.7 g/dL (ref 30.0–36.0)
MCV: 97.6 fL (ref 80.0–100.0)
Monocytes Absolute: 1.3 10*3/uL — ABNORMAL HIGH (ref 0.1–1.0)
Monocytes Relative: 7 %
Neutro Abs: 14 10*3/uL — ABNORMAL HIGH (ref 1.7–7.7)
Neutrophils Relative %: 76 %
Platelets: 542 10*3/uL — ABNORMAL HIGH (ref 150–400)
RBC: 2.55 MIL/uL — ABNORMAL LOW (ref 3.87–5.11)
RDW: 17.3 % — ABNORMAL HIGH (ref 11.5–15.5)
Smear Review: NORMAL
WBC: 18.5 10*3/uL — ABNORMAL HIGH (ref 4.0–10.5)
nRBC: 0.1 % (ref 0.0–0.2)

## 2022-11-12 LAB — CULTURE, RESPIRATORY W GRAM STAIN

## 2022-11-12 LAB — BASIC METABOLIC PANEL
Anion gap: 6 (ref 5–15)
BUN: 37 mg/dL — ABNORMAL HIGH (ref 6–20)
CO2: 22 mmol/L (ref 22–32)
Calcium: 8.4 mg/dL — ABNORMAL LOW (ref 8.9–10.3)
Chloride: 113 mmol/L — ABNORMAL HIGH (ref 98–111)
Creatinine, Ser: 1.54 mg/dL — ABNORMAL HIGH (ref 0.44–1.00)
GFR, Estimated: 38 mL/min — ABNORMAL LOW (ref >60)
Glucose, Bld: 83 mg/dL (ref 70–99)
Potassium: 4.2 mmol/L (ref 3.5–5.1)
Sodium: 141 mmol/L (ref 135–145)

## 2022-11-12 LAB — CULTURE, BLOOD (ROUTINE X 2): Special Requests: ADEQUATE

## 2022-11-12 MED ORDER — LOPERAMIDE HCL 2 MG PO CAPS: 2.0000 mg | ORAL_CAPSULE | Freq: Four times a day (QID) | ORAL | Status: DC | PRN

## 2022-11-12 NOTE — Progress Notes (Addendum)
Progress Note    [name]  ZOX:[id number] DOB: Sep 02, 1960  DOA: 11/09/2022 PCP: [facility]      Brief Narrative:    Medical records reviewed and are as summarized below:  [name] is a 62 y.o. female with medical history significant for COPD, hypertension, CKD stage IIIb, iron deficiency anemia, hyperlipidemia, hypothyroidism, polysubstance use disorder, history of GI bleed, history of diverticulitis who presented to the hospital with cough, shortness of breath, diarrhea, lower abdominal pain for about 3 weeks.    She was admitted to the hospital for severe sepsis secondary to pneumococcal pneumonia.   Assessment/Plan:   Principal Problem:   Severe sepsis (HCC) Active Problems:   Substance use disorder   Pneumococcal pneumonia (HCC)   AKI (acute kidney injury) (HCC)   Acute respiratory failure with hypoxia (HCC)   Stage 3 chronic kidney disease (HCC)   Chronic obstructive pulmonary disease (HCC)   Abdominal pain   Diarrhea   Iron deficiency anemia   Body mass index is 19.92 kg/m.   Severe sepsis secondary to pneumococcal pneumonia: Lactic acid was 2.8, WBC 24.7, heart rate 123, respiratory rate 23 on admission.  Leukocytosis is improved.  Strep urinary antigen was positive.  Continue IV ceftriaxone.   AKI on CKD stage IIIb: Creatinine improved to baseline.   Acute hypoxic respiratory failure: She is tolerating room air at rest.   Use oxygen as needed.  She may also have underlying chronic hypoxic respiratory failure from COPD.  Check ambulatory oxygen saturation   Lower abdominal pain, history of chronic abdominal pain, chronic diarrhea and diverticulitis:  No acute abnormality on CT abdomen/pelvis.   Diarrhea: Imodium as needed.  C. diff negative   Polysubstance use disorder: Urine drug screen positive for opiates and tricyclics   Oral thrush, dysphagia: Continue Magic mouthwash.  She is on dysphagia 1 diet.   Left leg  ulcers: Apply wet-to-dry dressing daily.   Iron deficiency anemia, acute on chronic anemia: H&H is stable.  Hemoglobin is 7.9.  Monitor H&H and transfuse PRBCs as needed.  Iron 9, TIBC 102, saturation ratio 9 and ferritin 119.  Elevated Vitamin( 2,141)     Diet Order             DIET - DYS 1 Room service appropriate? Yes with Assist; Fluid consistency: Thin  Diet effective now                            Consultants: None  Procedures: None    Medications:    enoxaparin (LOVENOX) injection  30 mg Subcutaneous Q24H   feeding supplement  237 mL Oral TID BM   ferrous sulfate  325 mg Oral QODAY   guaiFENesin  600 mg Oral BID   levothyroxine  50 mcg Oral Q0600   multivitamin with minerals  1 tablet Oral Daily   predniSONE  40 mg Oral Q breakfast   rosuvastatin  5 mg Oral QPM   traZODone  50-100 mg Oral QHS   Continuous Infusions:  cefTRIAXone (ROCEPHIN)  IV 2 g (11/12/22 1309)     Anti-infectives (From admission, onward)    Start     Dose/Rate Route Frequency Ordered Stop   11/09/22 1330  cefTRIAXone (ROCEPHIN) 2 g in sodium chloride 0.9 % 100 mL IVPB        2 g 200 mL/hr over 30 Minutes Intravenous Every 24 hours 11/09/22 1328 11/14/22 1329  11/09/22 1330  azithromycin (ZITHROMAX) 500 mg in sodium chloride 0.9 % 250 mL IVPB  Status:  Discontinued        500 mg 250 mL/hr over 60 Minutes Intravenous Every 24 hours 11/09/22 1328 11/10/22 1249              Family Communication/Anticipated D/C date and plan/Code Status   DVT prophylaxis: enoxaparin (LOVENOX) injection 30 mg Start: 11/10/22 2200     Code Status: Full Code  Family Communication: Plan discussed with Jasmine December, her sister, at the bedside Disposition Plan: Plan to discharge home in 2 to 3 days   Status is: Inpatient Remains inpatient appropriate because: Pneumonia, acute hypoxic respiratory failure       Subjective:   She complains of cough and shortness of breath.  She  says she had 4 watery stools yesterday.  Jasmine December, sister, was at the bedside.   Objective:    Vitals:   11/12/22 0404 11/12/22 0953 11/12/22 1105 11/12/22 1301  BP: 137/60 137/64  116/64  Pulse: 95 (!) 105  (!) 108  Resp: 18 18  18   Temp: 98.2 F (36.8 C) 98 F (36.7 C)  98.1 F (36.7 C)  TempSrc:      SpO2: 95% 98%  96%  Weight:   43.2 kg    No data found.   Intake/Output Summary (Last 24 hours) at 11/12/2022 1610 Last data filed at 11/12/2022 1057 Gross per 24 hour  Intake 500.62 ml  Output --  Net 500.62 ml   Filed Weights   11/09/22 1627 11/11/22 0751 11/12/22 1105  Weight: 44.2 kg 42.3 kg 43.2 kg    Exam:  GEN: NAD SKIN: Warm and dry EYES: EOMI ENT: MMM, white patches on the tongue,? thrush CV: RRR PULM: Bibasilar rales ABD: soft, ND, NT, +BS CNS: AAO x 3, non focal EXT: No edema or tenderness        Data Reviewed:   I have personally reviewed following labs and imaging studies:  Labs: Labs show the following:   Basic Metabolic Panel: Recent Labs  Lab 11/09/22 1308 11/10/22 0530 11/11/22 0618 11/12/22 0409  NA 138 139 140 141  K 4.0 4.2 4.6 4.2  CL 105 113* 112* 113*  CO2 20* 20* 20* 22  GLUCOSE 91 138* 91 83  BUN 25* 19 26* 37*  CREATININE 2.32* 1.73* 1.59* 1.54*  CALCIUM 8.5* 7.5* 8.1* 8.4*  MG  --   --  2.0  --   PHOS  --   --  2.9  --    GFR Estimated Creatinine Clearance: 25.1 mL/min (A) (by C-G formula based on SCr of 1.54 mg/dL (H)). Liver Function Tests: Recent Labs  Lab 11/09/22 1308 11/10/22 0530  AST 31 20  ALT 18 14  ALKPHOS 141* 183*  BILITOT 0.6 0.3  PROT 5.8* 4.4*  ALBUMIN 2.1* 1.6*   Recent Labs  Lab 11/09/22 1308  LIPASE 39   No results for input(s): "AMMONIA" in the last 168 hours. Coagulation profile Recent Labs  Lab 11/09/22 1308  INR 1.2    CBC: Recent Labs  Lab 11/09/22 1308 11/10/22 0530 11/11/22 0618 11/12/22 0409  WBC 24.7* 12.5* 26.7* 18.5*  NEUTROABS  --   --  23.7* 14.0*  HGB  9.5* 7.4* 7.9* 7.9*  HCT 30.0* 23.2* 24.7* 24.9*  MCV 98.0 98.3 98.8 97.6  PLT 588* 473* 531* 542*   Cardiac Enzymes: No results for input(s): "CKTOTAL", "CKMB", "CKMBINDEX", "TROPONINI" in the last 168 hours. BNP (last  3 results) No results for input(s): "PROBNP" in the last 8760 hours. CBG: No results for input(s): "GLUCAP" in the last 168 hours. D-Dimer: No results for input(s): "DDIMER" in the last 72 hours. Hgb A1c: No results for input(s): "HGBA1C" in the last 72 hours. Lipid Profile: No results for input(s): "CHOL", "HDL", "LDLCALC", "TRIG", "CHOLHDL", "LDLDIRECT" in the last 72 hours. Thyroid function studies: No results for input(s): "TSH", "T4TOTAL", "T3FREE", "THYROIDAB" in the last 72 hours.  Invalid input(s): "FREET3" Anemia work up: Recent Labs    11/10/22 0527 11/10/22 1025  VITAMINB12  --  2,141*  FOLATE 8.4  --   FERRITIN 119  --   TIBC 102*  --   IRON 9*  --    Sepsis Labs: Recent Labs  Lab 11/09/22 1308 11/09/22 1531 11/10/22 0530 11/11/22 0618 11/12/22 0409  WBC 24.7*  --  12.5* 26.7* 18.5*  LATICACIDVEN 2.8* 2.0* 1.3  --   --     Microbiology Recent Results (from the past 240 hour(s))  Blood culture (routine x 2)     Status: None (Preliminary result)   Collection Time: 11/09/22  1:08 PM   Specimen: BLOOD  Result Value Ref Range Status   Specimen Description BLOOD BLOOD LEFT ARM  Final   Special Requests   Final    BOTTLES DRAWN AEROBIC AND ANAEROBIC Blood Culture adequate volume   Culture   Final    NO GROWTH 3 DAYS Performed at [facility], [address] [place], [place] [address]    Report Status PENDING  Incomplete  Blood culture (routine x 2)     Status: None (Preliminary result)   Collection Time: 11/09/22  1:35 PM   Specimen: BLOOD  Result Value Ref Range Status   Specimen Description BLOOD RIGHT ANTECUBITAL  Final   Special Requests   Final    BOTTLES DRAWN AEROBIC AND ANAEROBIC Blood Culture adequate volume    Culture   Final    NO GROWTH 3 DAYS Performed at [facility], [address], [place], [place] [address]    Report Status PENDING  Incomplete  Resp panel by RT-PCR (RSV, Flu A&B, Covid) Anterior Nasal Swab     Status: None   Collection Time: 11/09/22  3:41 PM   Specimen: Anterior Nasal Swab  Result Value Ref Range Status   SARS Coronavirus 2 by RT PCR NEGATIVE NEGATIVE Final    Comment: (NOTE) SARS-CoV-2 target nucleic acids are NOT DETECTED.  The SARS-CoV-2 RNA is generally detectable in upper respiratory specimens during the acute phase of infection. The lowest concentration of SARS-CoV-2 viral copies this assay can detect is 138 copies/mL. A negative result does not preclude SARS-Cov-2 infection and should not be used as the sole basis for treatment or other patient management decisions. A negative result may occur with  improper specimen collection/handling, submission of specimen other than nasopharyngeal swab, presence of viral mutation(s) within the areas targeted by this assay, and inadequate number of viral copies(<138 copies/mL). A negative result must be combined with clinical observations, patient history, and epidemiological information. The expected result is Negative.  Fact Sheet for Patients:  BloggerCourse.com  Fact Sheet for Healthcare Providers:  SeriousBroker.it  This test is no t yet approved or cleared by the [place] FDA and  has been authorized for detection and/or diagnosis of SARS-CoV-2 by FDA under an Emergency Use Authorization (EUA). This EUA will remain  in effect (meaning this test can be used) for the duration of the COVID-19  declaration under Section 564(b)(1) of the Act, 21 U.S.C.section 360bbb-3(b)(1), unless the authorization is terminated  or revoked sooner.       Influenza A by PCR NEGATIVE NEGATIVE Final   Influenza B by PCR NEGATIVE NEGATIVE Final    Comment:  (NOTE) The Xpert Xpress SARS-CoV-2/FLU/RSV plus assay is intended as an aid in the diagnosis of influenza from Nasopharyngeal swab specimens and should not be used as a sole basis for treatment. Nasal washings and aspirates are unacceptable for Xpert Xpress SARS-CoV-2/FLU/RSV testing.  Fact Sheet for Patients: BloggerCourse.com  Fact Sheet for Healthcare Providers: SeriousBroker.it  This test is not yet approved or cleared by the [place] FDA and has been authorized for detection and/or diagnosis of SARS-CoV-2 by FDA under an Emergency Use Authorization (EUA). This EUA will remain in effect (meaning this test can be used) for the duration of the COVID-19 declaration under Section 564(b)(1) of the Act, 21 U.S.C. section 360bbb-3(b)(1), unless the authorization is terminated or revoked.     Resp Syncytial Virus by PCR NEGATIVE NEGATIVE Final    Comment: (NOTE) Fact Sheet for Patients: BloggerCourse.com  Fact Sheet for Healthcare Providers: SeriousBroker.it  This test is not yet approved or cleared by the [place] FDA and has been authorized for detection and/or diagnosis of SARS-CoV-2 by FDA under an Emergency Use Authorization (EUA). This EUA will remain in effect (meaning this test can be used) for the duration of the COVID-19 declaration under Section 564(b)(1) of the Act, 21 U.S.C. section 360bbb-3(b)(1), unless the authorization is terminated or revoked.  Performed at [facility], [address] [place], [place] [address]   MRSA Next Gen by PCR, Nasal     Status: None   Collection Time: 11/10/22 10:25 AM   Specimen: Nasal Mucosa; Nasal Swab  Result Value Ref Range Status   MRSA by PCR Next Gen NOT DETECTED NOT DETECTED Final    Comment: (NOTE) The GeneXpert MRSA Assay (FDA approved for NASAL specimens only), is one component of a comprehensive MRSA  colonization surveillance program. It is not intended to diagnose MRSA infection nor to guide or monitor treatment for MRSA infections. Test performance is not FDA approved in patients less than 58 years old. Performed at [facility], [address] [place], [place] [address]   Expectorated Sputum Assessment w Gram Stain, Rflx to Resp Cult     Status: None   Collection Time: 11/10/22 10:25 AM   Specimen: Expectorated Sputum  Result Value Ref Range Status   Specimen Description EXPECTORATED SPUTUM  Final   Special Requests NONE  Final   Sputum evaluation   Final    THIS SPECIMEN IS ACCEPTABLE FOR SPUTUM CULTURE Performed at [facility], [address], [place], [place] [address]    Report Status 11/10/2022 FINAL  Final  Culture, Respiratory w Gram Stain     Status: None   Collection Time: 11/10/22 10:25 AM  Result Value Ref Range Status   Specimen Description   Final    EXPECTORATED SPUTUM Performed at [facility], [address], [place], [place] [address]    Special Requests   Final    NONE Reflexed from [phone] Performed at [facility], [address] [place], [place] [address]    Gram Stain   Final    ABUNDANT WBC PRESENT, PREDOMINANTLY PMN ABUNDANT YEAST WITH PSEUDOHYPHAE Performed at [facility], [address] [place], [place] [address]    Culture FEW CANDIDA ALBICANS  Final   Report Status 11/12/2022 FINAL  Final  C Difficile Quick Screen w PCR reflex     Status: None   Collection Time: 11/11/22 12:50 PM  Result Value Ref Range Status   C Diff antigen NEGATIVE NEGATIVE Final   C Diff toxin NEGATIVE NEGATIVE Final   C Diff interpretation No C. difficile detected.  Final    Comment: Performed at [facility], [address] [place], [place] [address]    Procedures and diagnostic studies:  No results found.             LOS: 3 days   Wyett Narine  [facility]   Pager on  [email or web address]. If 7PM-7AM, please contact night-coverage at [email or web address]     11/12/2022, 4:10 PM

## 2022-11-12 NOTE — Progress Notes (Signed)
Pt. Ambulated in hall way. Walked approx. 100 ft. O2 saturation fell to 86% on room air. Pt recovered well when told to take a rest and focus on relaxing breathing.

## 2022-11-12 NOTE — Plan of Care (Signed)

## 2022-11-13 DIAGNOSIS — R6520 Severe sepsis without septic shock: Secondary | ICD-10-CM | POA: Diagnosis not present

## 2022-11-13 DIAGNOSIS — A419 Sepsis, unspecified organism: Secondary | ICD-10-CM | POA: Diagnosis not present

## 2022-11-13 DIAGNOSIS — J13 Pneumonia due to Streptococcus pneumoniae: Secondary | ICD-10-CM | POA: Diagnosis not present

## 2022-11-13 LAB — CBC WITH DIFFERENTIAL/PLATELET
Abs Immature Granulocytes: 1.37 10*3/uL — ABNORMAL HIGH (ref 0.00–0.07)
Basophils Absolute: 0.1 10*3/uL (ref 0.0–0.1)
Basophils Relative: 0 %
Eosinophils Absolute: 0.1 10*3/uL (ref 0.0–0.5)
Eosinophils Relative: 1 %
HCT: 24.3 % — ABNORMAL LOW (ref 36.0–46.0)
Hemoglobin: 7.7 g/dL — ABNORMAL LOW (ref 12.0–15.0)
Immature Granulocytes: 10 %
Lymphocytes Relative: 14 %
Lymphs Abs: 1.9 10*3/uL (ref 0.7–4.0)
MCH: 30.8 pg (ref 26.0–34.0)
MCHC: 31.7 g/dL (ref 30.0–36.0)
MCV: 97.2 fL (ref 80.0–100.0)
Monocytes Absolute: 1.2 10*3/uL — ABNORMAL HIGH (ref 0.1–1.0)
Monocytes Relative: 9 %
Neutro Abs: 9.4 10*3/uL — ABNORMAL HIGH (ref 1.7–7.7)
Neutrophils Relative %: 66 %
Platelets: 542 10*3/uL — ABNORMAL HIGH (ref 150–400)
RBC: 2.5 MIL/uL — ABNORMAL LOW (ref 3.87–5.11)
RDW: 17.2 % — ABNORMAL HIGH (ref 11.5–15.5)
Smear Review: INCREASED
WBC: 14 10*3/uL — ABNORMAL HIGH (ref 4.0–10.5)
nRBC: 0.4 % — ABNORMAL HIGH (ref 0.0–0.2)

## 2022-11-13 LAB — MAGNESIUM: Magnesium: 2.1 mg/dL (ref 1.7–2.4)

## 2022-11-13 LAB — BASIC METABOLIC PANEL
Anion gap: 9 (ref 5–15)
BUN: 34 mg/dL — ABNORMAL HIGH (ref 6–20)
CO2: 23 mmol/L (ref 22–32)
Calcium: 8.5 mg/dL — ABNORMAL LOW (ref 8.9–10.3)
Chloride: 108 mmol/L (ref 98–111)
Creatinine, Ser: 1.43 mg/dL — ABNORMAL HIGH (ref 0.44–1.00)
GFR, Estimated: 42 mL/min — ABNORMAL LOW (ref >60)
Glucose, Bld: 94 mg/dL (ref 70–99)
Potassium: 4.1 mmol/L (ref 3.5–5.1)
Sodium: 140 mmol/L (ref 135–145)

## 2022-11-13 MED ORDER — ORAL CARE MOUTH RINSE: 15.0000 mL | OROMUCOSAL | Status: DC | PRN

## 2022-11-13 NOTE — Progress Notes (Signed)
Progress Note    [name]  ZOX:[id number] DOB: 02-Nov-1960  DOA: 11/09/2022 PCP: [facility]      Brief Narrative:    Medical records reviewed and are as summarized below:  [name] is a 62 y.o. female with medical history significant for COPD, hypertension, CKD stage IIIb, iron deficiency anemia, hyperlipidemia, hypothyroidism, polysubstance use disorder, history of GI bleed, history of diverticulitis who presented to the hospital with cough, shortness of breath, diarrhea, lower abdominal pain for about 3 weeks.    She was admitted to the hospital for severe sepsis secondary to pneumococcal pneumonia.   Assessment/Plan:   Principal Problem:   Severe sepsis (HCC) Active Problems:   Substance use disorder   Pneumococcal pneumonia (HCC)   AKI (acute kidney injury) (HCC)   Acute respiratory failure with hypoxia (HCC)   Stage 3 chronic kidney disease (HCC)   Chronic obstructive pulmonary disease (HCC)   Abdominal pain   Diarrhea   Iron deficiency anemia   Body mass index is 19.92 kg/m.   Severe sepsis secondary to pneumococcal pneumonia: Lactic acid was 2.8, WBC 24.7, heart rate 123, respiratory rate 23 on admission.  Leukocytosis continues to improve.  Continue IV ceftriaxone.  Plan to switch to Augmentin tomorrow. Strep urinary antigen was positive.    AKI on CKD stage IIIb: Creatinine improved to baseline.   Acute hypoxic respiratory failure: She is tolerating room air at rest.   Use oxygen as needed.  She may also have underlying chronic hypoxic respiratory failure from COPD.  Oxygen saturation was 86% on room air while ambulating.  Repeat ambulatory pulse oximetry to determine need for home oxygen   Lower abdominal pain, history of chronic abdominal pain, chronic diarrhea and diverticulitis:  No acute abnormality on CT abdomen/pelvis.   Diarrhea: Imodium as needed.  C. diff negative   Polysubstance use disorder: Urine drug screen  positive for opiates and tricyclics   Oral thrush, dysphagia: Continue Magic mouthwash.  She is on dysphagia 1 diet.  She wants her diet to be upgraded.  Consulted speech therapist to reassess swallowing.   Left leg ulcers: Apply wet-to-dry dressing daily.  Outpatient follow-up with wound care clinic   Iron deficiency anemia, acute on chronic anemia: H&H is stable.  Hemoglobin is 7.9.  Monitor H&H and transfuse PRBCs as needed.  Iron 9, TIBC 102, saturation ratio 9 and ferritin 119.  Elevated Vitamin( 2,141)     Diet Order             DIET - DYS 1 Room service appropriate? Yes with Assist; Fluid consistency: Thin  Diet effective now                            Consultants: None  Procedures: None    Medications:    enoxaparin (LOVENOX) injection  30 mg Subcutaneous Q24H   feeding supplement  237 mL Oral TID BM   ferrous sulfate  325 mg Oral QODAY   guaiFENesin  600 mg Oral BID   levothyroxine  50 mcg Oral Q0600   multivitamin with minerals  1 tablet Oral Daily   rosuvastatin  5 mg Oral QPM   traZODone  50-100 mg Oral QHS   Continuous Infusions:  cefTRIAXone (ROCEPHIN)  IV 200 mL/hr at 11/12/22 2234     Anti-infectives (From admission, onward)    Start     Dose/Rate Route Frequency Ordered Stop   11/09/22  1330  cefTRIAXone (ROCEPHIN) 2 g in sodium chloride 0.9 % 100 mL IVPB        2 g 200 mL/hr over 30 Minutes Intravenous Every 24 hours 11/09/22 1328 11/14/22 1329   11/09/22 1330  azithromycin (ZITHROMAX) 500 mg in sodium chloride 0.9 % 250 mL IVPB  Status:  Discontinued        500 mg 250 mL/hr over 60 Minutes Intravenous Every 24 hours 11/09/22 1328 11/10/22 1249              Family Communication/Anticipated D/C date and plan/Code Status   DVT prophylaxis: enoxaparin (LOVENOX) injection 30 mg Start: 11/10/22 2200     Code Status: Full Code  Family Communication: Plan discussed with Jasmine December, her sister, at the bedside Disposition  Plan: Plan to discharge home in 2 to 3 days   Status is: Inpatient Remains inpatient appropriate because: Pneumonia, acute hypoxic respiratory failure       Subjective:   Interval events noted.  She feels better today.  She still has a cough.  Breathing is better.   Objective:    Vitals:   11/12/22 1929 11/12/22 2337 11/13/22 0407 11/13/22 0729  BP: (!) 130/59 121/67 137/81 (!) 141/71  Pulse: 95 99 96 94  Resp: 18 18 18 16   Temp: 98.3 F (36.8 C) 98.3 F (36.8 C) 97.6 F (36.4 C) 98.5 F (36.9 C)  TempSrc:      SpO2: 100% 96% 99% 99%  Weight:       No data found.   Intake/Output Summary (Last 24 hours) at 11/13/2022 1205 Last data filed at 11/12/2022 2234 Gross per 24 hour  Intake 340 ml  Output --  Net 340 ml   Filed Weights   11/09/22 1627 11/11/22 0751 11/12/22 1105  Weight: 44.2 kg 42.3 kg 43.2 kg    Exam:  GEN: NAD SKIN: Warm and dry EYES: No pallor or icterus ENT: MMM, white patches on the tongue, ?chronic CV: RRR PULM: Bibasilar rales, no wheezing, improved air entry bilaterally ABD: soft, ND, NT, +BS CNS: AAO x 3, non focal EXT: No edema or tenderness      Data Reviewed:   I have personally reviewed following labs and imaging studies:  Labs: Labs show the following:   Basic Metabolic Panel: Recent Labs  Lab 11/09/22 1308 11/10/22 0530 11/11/22 0618 11/12/22 0409 11/13/22 0356  NA 138 139 140 141 140  K 4.0 4.2 4.6 4.2 4.1  CL 105 113* 112* 113* 108  CO2 20* 20* 20* 22 23  GLUCOSE 91 138* 91 83 94  BUN 25* 19 26* 37* 34*  CREATININE 2.32* 1.73* 1.59* 1.54* 1.43*  CALCIUM 8.5* 7.5* 8.1* 8.4* 8.5*  MG  --   --  2.0  --  2.1  PHOS  --   --  2.9  --   --    GFR Estimated Creatinine Clearance: 27 mL/min (A) (by C-G formula based on SCr of 1.43 mg/dL (H)). Liver Function Tests: Recent Labs  Lab 11/09/22 1308 11/10/22 0530  AST 31 20  ALT 18 14  ALKPHOS 141* 183*  BILITOT 0.6 0.3  PROT 5.8* 4.4*  ALBUMIN 2.1* 1.6*    Recent Labs  Lab 11/09/22 1308  LIPASE 39   No results for input(s): "AMMONIA" in the last 168 hours. Coagulation profile Recent Labs  Lab 11/09/22 1308  INR 1.2    CBC: Recent Labs  Lab 11/09/22 1308 11/10/22 0530 11/11/22 0618 11/12/22 0409 11/13/22 0356  WBC  24.7* 12.5* 26.7* 18.5* 14.0*  NEUTROABS  --   --  23.7* 14.0* 9.4*  HGB 9.5* 7.4* 7.9* 7.9* 7.7*  HCT 30.0* 23.2* 24.7* 24.9* 24.3*  MCV 98.0 98.3 98.8 97.6 97.2  PLT 588* 473* 531* 542* 542*   Cardiac Enzymes: No results for input(s): "CKTOTAL", "CKMB", "CKMBINDEX", "TROPONINI" in the last 168 hours. BNP (last 3 results) No results for input(s): "PROBNP" in the last 8760 hours. CBG: No results for input(s): "GLUCAP" in the last 168 hours. D-Dimer: No results for input(s): "DDIMER" in the last 72 hours. Hgb A1c: No results for input(s): "HGBA1C" in the last 72 hours. Lipid Profile: No results for input(s): "CHOL", "HDL", "LDLCALC", "TRIG", "CHOLHDL", "LDLDIRECT" in the last 72 hours. Thyroid function studies: No results for input(s): "TSH", "T4TOTAL", "T3FREE", "THYROIDAB" in the last 72 hours.  Invalid input(s): "FREET3" Anemia work up: No results for input(s): "VITAMINB12", "FOLATE", "FERRITIN", "TIBC", "IRON", "RETICCTPCT" in the last 72 hours.  Sepsis Labs: Recent Labs  Lab 11/09/22 1308 11/09/22 1531 11/10/22 0530 11/11/22 0618 11/12/22 0409 11/13/22 0356  WBC 24.7*  --  12.5* 26.7* 18.5* 14.0*  LATICACIDVEN 2.8* 2.0* 1.3  --   --   --     Microbiology Recent Results (from the past 240 hour(s))  Blood culture (routine x 2)     Status: None (Preliminary result)   Collection Time: 11/09/22  1:08 PM   Specimen: BLOOD  Result Value Ref Range Status   Specimen Description BLOOD BLOOD LEFT ARM  Final   Special Requests   Final    BOTTLES DRAWN AEROBIC AND ANAEROBIC Blood Culture adequate volume   Culture   Final    NO GROWTH 4 DAYS Performed at [facility], [address] [place], [place] [address]    Report Status PENDING  Incomplete  Blood culture (routine x 2)     Status: None (Preliminary result)   Collection Time: 11/09/22  1:35 PM   Specimen: BLOOD  Result Value Ref Range Status   Specimen Description BLOOD RIGHT ANTECUBITAL  Final   Special Requests   Final    BOTTLES DRAWN AEROBIC AND ANAEROBIC Blood Culture adequate volume   Culture   Final    NO GROWTH 4 DAYS Performed at [facility], [address] [place], [place] [address]    Report Status PENDING  Incomplete  Resp panel by RT-PCR (RSV, Flu A&B, Covid) Anterior Nasal Swab     Status: None   Collection Time: 11/09/22  3:41 PM   Specimen: Anterior Nasal Swab  Result Value Ref Range Status   SARS Coronavirus 2 by RT PCR NEGATIVE NEGATIVE Final    Comment: (NOTE) SARS-CoV-2 target nucleic acids are NOT DETECTED.  The SARS-CoV-2 RNA is generally detectable in upper respiratory specimens during the acute phase of infection. The lowest concentration of SARS-CoV-2 viral copies this assay can detect is 138 copies/mL. A negative result does not preclude SARS-Cov-2 infection and should not be used as the sole basis for treatment or other patient management decisions. A negative result may occur with  improper specimen collection/handling, submission of specimen other than nasopharyngeal swab, presence of viral mutation(s) within the areas targeted by this assay, and inadequate number of viral copies(<138 copies/mL). A negative result must be combined with clinical observations, patient history, and epidemiological information. The expected result is Negative.  Fact Sheet for Patients:  BloggerCourse.com  Fact Sheet for Healthcare Providers:  SeriousBroker.it  This test is no t yet approved or  cleared by the [place] and  has been authorized for detection and/or diagnosis of SARS-CoV-2 by FDA under an Emergency Use  Authorization (EUA). This EUA will remain  in effect (meaning this test can be used) for the duration of the COVID-19 declaration under Section 564(b)(1) of the Act, 21 U.S.C.section 360bbb-3(b)(1), unless the authorization is terminated  or revoked sooner.       Influenza A by PCR NEGATIVE NEGATIVE Final   Influenza B by PCR NEGATIVE NEGATIVE Final    Comment: (NOTE) The Xpert Xpress SARS-CoV-2/FLU/RSV plus assay is intended as an aid in the diagnosis of influenza from Nasopharyngeal swab specimens and should not be used as a sole basis for treatment. Nasal washings and aspirates are unacceptable for Xpert Xpress SARS-CoV-2/FLU/RSV testing.  Fact Sheet for Patients: BloggerCourse.com  Fact Sheet for Healthcare Providers: SeriousBroker.it  This test is not yet approved or cleared by the [place] FDA and has been authorized for detection and/or diagnosis of SARS-CoV-2 by FDA under an Emergency Use Authorization (EUA). This EUA will remain in effect (meaning this test can be used) for the duration of the COVID-19 declaration under Section 564(b)(1) of the Act, 21 U.S.C. section 360bbb-3(b)(1), unless the authorization is terminated or revoked.     Resp Syncytial Virus by PCR NEGATIVE NEGATIVE Final    Comment: (NOTE) Fact Sheet for Patients: BloggerCourse.com  Fact Sheet for Healthcare Providers: SeriousBroker.it  This test is not yet approved or cleared by the [place] FDA and has been authorized for detection and/or diagnosis of SARS-CoV-2 by FDA under an Emergency Use Authorization (EUA). This EUA will remain in effect (meaning this test can be used) for the duration of the COVID-19 declaration under Section 564(b)(1) of the Act, 21 U.S.C. section 360bbb-3(b)(1), unless the authorization is terminated or revoked.  Performed at [facility], [address] [place], [place] [address]   MRSA Next Gen by PCR, Nasal     Status: None   Collection Time: 11/10/22 10:25 AM   Specimen: Nasal Mucosa; Nasal Swab  Result Value Ref Range Status   MRSA by PCR Next Gen NOT DETECTED NOT DETECTED Final    Comment: (NOTE) The GeneXpert MRSA Assay (FDA approved for NASAL specimens only), is one component of a comprehensive MRSA colonization surveillance program. It is not intended to diagnose MRSA infection nor to guide or monitor treatment for MRSA infections. Test performance is not FDA approved in patients less than 62 years old. Performed at [facility], [address] [place], [place] [address]   Expectorated Sputum Assessment w Gram Stain, Rflx to Resp Cult     Status: None   Collection Time: 11/10/22 10:25 AM   Specimen: Expectorated Sputum  Result Value Ref Range Status   Specimen Description EXPECTORATED SPUTUM  Final   Special Requests NONE  Final   Sputum evaluation   Final    THIS SPECIMEN IS ACCEPTABLE FOR SPUTUM CULTURE Performed at [facility], [address], [place], [place] [address]    Report Status 11/10/2022 FINAL  Final  Culture, Respiratory w Gram Stain     Status: None   Collection Time: 11/10/22 10:25 AM  Result Value Ref Range Status   Specimen Description   Final    EXPECTORATED SPUTUM Performed at [facility], [address], [place], [place] [address]    Special Requests   Final    NONE Reflexed from [phone] Performed at [facility], [address]  [place], [place] [address]    Gram Stain   Final    ABUNDANT WBC PRESENT, PREDOMINANTLY PMN ABUNDANT YEAST WITH PSEUDOHYPHAE Performed at [facility], [address] [place], [place] [address]    Culture FEW CANDIDA ALBICANS  Final   Report Status 11/12/2022 FINAL  Final  C Difficile Quick Screen w PCR reflex     Status: None   Collection Time: 11/11/22 12:50 PM  Result Value Ref Range Status   C Diff antigen  NEGATIVE NEGATIVE Final   C Diff toxin NEGATIVE NEGATIVE Final   C Diff interpretation No C. difficile detected.  Final    Comment: Performed at [facility], [address] [place], [place] [address]    Procedures and diagnostic studies:  No results found.             LOS: 4 days   Hiawatha Dressel  [facility]   Pager on [email or web address]. If 7PM-7AM, please contact night-coverage at [email or web address]     11/13/2022, 12:05 PM

## 2022-11-13 NOTE — Progress Notes (Signed)
Speech Language Pathology Treatment: Dysphagia  Patient Details Name: [name] MRN: [id number] DOB: May 02, 1961 Today's Date: 11/13/2022 Time: 3244-0102 SLP Time Calculation (min) (ACUTE ONLY): 25 min  Assessment / Plan / Recommendation Clinical Impression  [name] seen today for trials to upgrade diet. [name] reports gums "feel better now"; ongoing oral rinse txs were ordered s/p BSE and report of "sore mouth since I had my Teeth removed in Feb (2024)" impacting her oral intake/swallowing. [name] awake, verbal and communicated adequately w/ this SLP. [name] appeared to be feeling much improved since the BSE. [name] sate EOB and fed herself. [name] denied as much oral "pain" as [name] had reported at the BSE.  On RA; afebrile.   [name] appears to present w/ functional oropharyngeal phase swallowing w/ mild discomfort/odynophagia but No oropharyngeal phase neuromuscular deficits noted. [name] consumed po trials w/ No overt clinical s/s of aspiration during po trials.  [name] appears at reduced risk for aspiration following general aspiration precautions; impact from Dental issues/oral discomfort.    During po trials, [name] consumed trials of moistened solid foods (use of puree to moisten) w/ no overt clinical s/s of aspiration noted; no cough, no change in vocal quality, or decline in respiratory presentation during/post trials. Oral phase appeared grossly [facility] w/ timely bolus management and control of bolus propulsion for A-P transfer for swallowing. Min increased time for mashing/gumming softened solids, but [facility] for [name]. [name] denied any new difficulty eating the soft solid foods. Oral clearing achieved w/ all trial consistencies.     Recommend a softened solid foods, Regular consistency diet w/ well-moistened foods to aid mashing/gumming of solids; Thin liquids -- less straw use for air swallowed. Recommend general aspiration precautions, support at meals as needed. Pills in Puree for safer, easier swallowing as needed d/t oral  discomfort.  Recommend f/u w/ Dental consult to address upper partial ill-fit; oral health overall. Recommend continue her ordered(MD) oral rinse to address healing of oropharynx secondary to reported odynophagia; less numbing sprays just addressing pain used d/t the numbing effect and risk for aspiration during swallowing.    Education given on Pills in Puree; food consistencies and options, food prep; general aspiration and reflux precautions to [name] and NSG. No further skilled ST services indicated. MD/NSG to reconsult if any new needs arise. NSG updated, agreed. MD updated.  Strongly recommend a Dental consult and f/u; a Dietician consult f/u for support.       HPI HPI: [name] is a 62 y.o. female with medical history significant of HTN, CKD stage 3, IDA, HLD, hypothyroidism, Polysubstance Abuse, hx of GI bleed, Colitis, Diverticulitis, GERD who was brought in by EMS for cough for past 3 weeks with diarrhea, lower abdominal pain. Thinks [name] has COPD. Has not smoked tobacco in 4 months. Cough keeps her up non-bloody sputum. Has shortness of breath but no wheezing.  No fever but experiencing chills. Has dry-heeves due to coughing. [name] has not been eating well in recent Months since having Teeth Extraction in Feb 2024 (per [name] report) -- all Teeth were removed except 5-6.  [name] now has an Upper Partial which is ill-fitting and "cuts into my gum".  [name] endorses a "VERY PAINFUL" mouth.  [name] has lost about 20 lbs since having the dental problems and has not been eating much for the past 6 weeks per her report.  [name] feels her mouth has "not healed" since having her Teeth extracted.  CXR this admit: patchy bilateral airspace disease.  Last CXR Imagning was  in 2023.  Had admit for abdominal pain in 05/2022.      SLP Plan  All goals met      Recommendations for follow up therapy are one component of a multi-disciplinary discharge planning process, led by the attending physician.  Recommendations may be updated  based on patient status, additional functional criteria and insurance authorization.    Recommendations  Diet recommendations: Regular;Thin liquid (choose foods that are easy to gum/mash) Liquids provided via: Cup Medication Administration: Whole meds with puree (as needed for ease) Supervision: Patient able to self feed Compensations: Minimize environmental distractions;Slow rate;Small sips/bites;Lingual sweep for clearance of pocketing;Follow solids with liquid Postural Changes and/or Swallow Maneuvers: Out of bed for meals;Seated upright 90 degrees;Upright 30-60 min after meal                 (Dietician; Dental consult) Oral care BID;Oral care before and after PO;Patient independent with oral care (denture care)   PRN Dysphagia, oral phase (R13.11) (mild oral discomfort d/t teeth pulled and not healing well; Edentulous status)     All goals met        Jerilynn Som, MS, CCC-SLP Speech Language Pathologist Rehab Services; [facility] [phone] (ascom) [name]  11/13/2022, 1:30 PM

## 2022-11-13 NOTE — Plan of Care (Signed)

## 2022-11-14 ENCOUNTER — Encounter: Payer: Self-pay | Admitting: Family Medicine

## 2022-11-14 ENCOUNTER — Inpatient Hospital Stay: Payer: Commercial Managed Care - HMO

## 2022-11-14 DIAGNOSIS — J13 Pneumonia due to Streptococcus pneumoniae: Secondary | ICD-10-CM | POA: Diagnosis not present

## 2022-11-14 DIAGNOSIS — L97929 Non-pressure chronic ulcer of unspecified part of left lower leg with unspecified severity: Secondary | ICD-10-CM | POA: Diagnosis present

## 2022-11-14 DIAGNOSIS — R6520 Severe sepsis without septic shock: Secondary | ICD-10-CM | POA: Diagnosis not present

## 2022-11-14 DIAGNOSIS — A419 Sepsis, unspecified organism: Secondary | ICD-10-CM | POA: Diagnosis not present

## 2022-11-14 LAB — CBC WITH DIFFERENTIAL/PLATELET
Abs Immature Granulocytes: 1.84 10*3/uL — ABNORMAL HIGH (ref 0.00–0.07)
Basophils Absolute: 0.1 10*3/uL (ref 0.0–0.1)
Basophils Relative: 0 %
Eosinophils Absolute: 0 10*3/uL (ref 0.0–0.5)
Eosinophils Relative: 0 %
HCT: 23.2 % — ABNORMAL LOW (ref 36.0–46.0)
Hemoglobin: 7.4 g/dL — ABNORMAL LOW (ref 12.0–15.0)
Immature Granulocytes: 12 %
Lymphocytes Relative: 16 %
Lymphs Abs: 2.5 10*3/uL (ref 0.7–4.0)
MCH: 30.7 pg (ref 26.0–34.0)
MCHC: 31.9 g/dL (ref 30.0–36.0)
MCV: 96.3 fL (ref 80.0–100.0)
Monocytes Absolute: 1.3 10*3/uL — ABNORMAL HIGH (ref 0.1–1.0)
Monocytes Relative: 8 %
Neutro Abs: 9.9 10*3/uL — ABNORMAL HIGH (ref 1.7–7.7)
Neutrophils Relative %: 64 %
Platelets: 536 10*3/uL — ABNORMAL HIGH (ref 150–400)
RBC: 2.41 MIL/uL — ABNORMAL LOW (ref 3.87–5.11)
RDW: 17.2 % — ABNORMAL HIGH (ref 11.5–15.5)
Smear Review: NORMAL
WBC: 15.6 10*3/uL — ABNORMAL HIGH (ref 4.0–10.5)
nRBC: 0.4 % — ABNORMAL HIGH (ref 0.0–0.2)

## 2022-11-14 LAB — CULTURE, BLOOD (ROUTINE X 2): Culture: NO GROWTH

## 2022-11-14 LAB — FERRITIN: Ferritin: 52 ng/mL (ref 11–307)

## 2022-11-14 MED ORDER — AMOXICILLIN-POT CLAVULANATE 875-125 MG PO TABS: 1.0000 | ORAL_TABLET | Freq: Two times a day (BID) | ORAL | 0 refills | Status: DC

## 2022-11-14 MED ORDER — SODIUM CHLORIDE 0.9 % IV SOLN
200.0000 mg | Freq: Once | INTRAVENOUS | Status: AC
  Administered 2022-11-14: 200 mg via INTRAVENOUS
  Filled 2022-11-14: qty 200

## 2022-11-14 NOTE — Progress Notes (Signed)
Patient maintained o2 sat above 92% while ambulating on room air; no need for supplemental oxygen for recovery.    11/14/22 1316  Vitals  Temp 97.9 F (36.6 C)  Temp Source Oral  BP 137/74  MAP (mmHg) 95  BP Location Right Arm  BP Method Automatic  Patient Position (if appropriate) Sitting  Pulse Rate 97  Pulse Rate Source Monitor  ECG Heart Rate 98  Resp 16  MEWS COLOR  MEWS Score Color Green  Oxygen Therapy  SpO2 100 %  O2 Device Room Air  MEWS Score  MEWS Temp 0  MEWS Systolic 0  MEWS Pulse 0  MEWS RR 0  MEWS LOC 0  MEWS Score 0

## 2022-11-14 NOTE — Discharge Summary (Signed)
Physician Discharge Summary   Patient: NEKO BRANDSTETTER MRN: [id number] DOB: [birth year]  Admit date:     11/09/2022  Discharge date: 11/14/22  Discharge Physician: Lurene Shadow   PCP: [facility]   Recommendations at discharge:    Follow up with PCP in 1 week  Discharge Diagnoses: Principal Problem:   Severe sepsis (HCC) Active Problems:   Substance use disorder   Pneumococcal pneumonia (HCC)   AKI (acute kidney injury) (HCC)   Acute respiratory failure with hypoxia (HCC)   Stage 3 chronic kidney disease (HCC)   Chronic obstructive pulmonary disease (HCC)   Abdominal pain   Diarrhea   Iron deficiency anemia   Leg ulcer, left (HCC)  Resolved Problems:   * No resolved hospital problems. *  Hospital Course:  CORNELL BRUCKER is a 62 y.o. female with medical history significant for COPD, hypertension, CKD stage IIIb, iron deficiency anemia, hyperlipidemia, hypothyroidism, polysubstance use disorder, history of GI bleed, history of diverticulitis who presented to the hospital with cough, shortness of breath, diarrhea, lower abdominal pain for about 3 weeks.       She was admitted to the hospital for severe sepsis secondary to pneumococcal pneumonia.   Assessment and Plan:   Severe sepsis secondary to pneumococcal pneumonia: Lactic acid was 2.8, WBC 24.7, heart rate 123, respiratory rate 23 on admission.  Leukocytosis improved to 15.6.  She will be discharged on Augmentin to complete 7 days of treatment. Strep urinary antigen was positive.      AKI on CKD stage IIIb: Creatinine improved to baseline.     Acute hypoxic respiratory failure: Resolved.  Oxygen saturation with ambulation on room air was 92%.   Lower abdominal pain, history of chronic abdominal pain, chronic diarrhea and diverticulitis:  No acute abnormality on CT abdomen/pelvis.     Diarrhea: Improved.  Imodium as needed.  C. diff negative     Polysubstance use disorder, tobacco use disorder:  Urine drug screen positive for opiates and tricyclics.  Counseled to quit      Oral thrush, dysphagia: Continue Magic mouthwash.  She is on dysphagia 1 diet.  She wants her diet to be upgraded.  Consulted speech therapist to reassess swallowing.     Left leg ulcers: Apply wet-to-dry dressing daily.  Outpatient follow-up with wound care clinic     Iron deficiency anemia, acute on chronic anemia: Hemoglobin is down from 7.7-7.4.  Iron 9, TIBC 102, saturation ratio 9 and ferritin 119.  Repeat ferritin after treatment of pneumonia was 52.  Patient was given 1 dose of IV iron sucrose on the day of discharge.  Elevated Vitamin( 2,141)     Overall, her condition has improved.  She said she feels much better and wants to go home today.  Close follow-up with PCP was recommended for routine health maintenance.        Consultants: None Procedures performed: None  Disposition: Home Diet recommendation:  Discharge Diet Orders (From admission, onward)     Start     Ordered   11/14/22 0000  Diet - low sodium heart healthy        11/14/22 1406           Cardiac diet DISCHARGE MEDICATION: Allergies as of 11/14/2022   No Known Allergies      Medication List     STOP taking these medications    Biotin 5000 MCG Tabs       TAKE these medications    albuterol 108 (90  Base) MCG/ACT inhaler Commonly known as: VENTOLIN HFA Inhale 2 puffs into the lungs every 6 (six) hours as needed for wheezing or shortness of breath.   amoxicillin-clavulanate 875-125 MG tablet Commonly known as: AUGMENTIN Take 1 tablet by mouth 2 (two) times daily.   cyclobenzaprine 10 MG tablet Commonly known as: FLEXERIL Take 10 mg by mouth 2 (two) times daily as needed.   ferrous sulfate 325 (65 FE) MG tablet Take 1 tablet (325 mg total) by mouth every other day.   HYDROcodone-acetaminophen 5-325 MG tablet Commonly known as: NORCO/VICODIN Take 2 tablets by mouth 3 (three) times daily as needed.    levothyroxine 50 MCG tablet Commonly known as: SYNTHROID Take 50 mcg by mouth daily before breakfast.   lisinopril 10 MG tablet Commonly known as: ZESTRIL Take 10 mg by mouth daily.   naloxone 4 MG/0.1ML Liqd nasal spray kit Commonly known as: NARCAN Place 1 spray into the nose once.   ondansetron 8 MG disintegrating tablet Commonly known as: ZOFRAN-ODT Take 8 mg by mouth 2 (two) times daily as needed for nausea.   pantoprazole 40 MG tablet Commonly known as: Protonix Take 1 tablet (40 mg total) by mouth 2 (two) times daily for 10 days.   rosuvastatin 5 MG tablet Commonly known as: Crestor Take 1 tablet (5 mg total) by mouth every evening. For cholesterol.   traZODone 50 MG tablet Commonly known as: DESYREL Take 50-100 mg by mouth at bedtime.   Vitamin D-3 125 MCG (5000 UT) Tabs Take 10,000 Units by mouth daily after breakfast.               Discharge Care Instructions  (From admission, onward)           Start     Ordered   11/14/22 0000  Discharge wound care:       Comments: Apply wet-to-dry dressing on left leg ulcers   11/14/22 1406            Discharge Exam: Filed Weights   11/09/22 1627 11/11/22 0751 11/12/22 1105  Weight: 44.2 kg 42.3 kg 43.2 kg   GEN: NAD SKIN: Warm and dry. Left leg ulcers (anterior and posterior distal leg) without drainage EYES: No pallor or icterus ENT: MMM CV: RRR PULM: Improved air entry bilaterally, bibasilar rales, no rhonchi. ABD: soft, ND, NT, +BS CNS: AAO x 3, non focal EXT: No edema or tenderness   Condition at discharge: good  The results of significant diagnostics from this hospitalization (including imaging, microbiology, ancillary and laboratory) are listed below for reference.   Imaging Studies: DG Chest 2 View  Result Date: 11/14/2022 CLINICAL DATA:  Pneumonia. EXAM: CHEST - 2 VIEW COMPARISON:  11/09/2022 FINDINGS: Lungs hyperexpanded. Similar appearance of the patchy basilar predominant  bilateral airspace opacities. Tiny right pleural effusion is more prominent than on the previous chest x-ray but was visible on chest CT from 11/10/2022. The cardiopericardial silhouette is within normal limits for size. No acute bony abnormality. Telemetry leads overlie the chest. IMPRESSION: 1. Similar appearance of patchy basilar predominant bilateral airspace disease. 2. Tiny right pleural effusion. Electronically Signed   By: Kennith Center M.D.   On: 11/14/2022 13:36   CT ABDOMEN PELVIS WO CONTRAST  Result Date: 11/10/2022 CLINICAL DATA:  Pain lower abdomen EXAM: CT ABDOMEN AND PELVIS WITHOUT CONTRAST TECHNIQUE: Multidetector CT imaging of the abdomen and pelvis was performed following the standard protocol without IV contrast. RADIATION DOSE REDUCTION: This exam was performed according to the departmental dose-optimization  program which includes automated exposure control, adjustment of the mA and/or kV according to patient size and/or use of iterative reconstruction technique. COMPARISON:  05/30/2022 FINDINGS: Lower chest: New small patchy infiltrates are seen in right middle lobe, lingula and both lower lobes. Small bilateral pleural effusions are seen, more so on the right side. Coronary artery calcifications are seen. Hepatobiliary: There is small focus of low-density in the liver close to the falciform ligament with no change, possibly aberrant venous drainage or focal fatty infiltration. Surgical clips are seen in gallbladder fossa. There is no dilation of bile ducts. Pancreas: No focal abnormalities are seen. Spleen: Unremarkable. Adrenals/Urinary Tract: Adrenals are unremarkable. There is no hydronephrosis. There are foci of cortical thinning in the kidneys, more so on the left side. There are no renal or ureteral stones. There is 1.6 cm fluid density lesion in the medial midportion of right kidney suggesting renal cyst. Urinary bladder is unremarkable. Stomach/Bowel: Stomach is moderately distended  with fluid There is no wall thickening in stomach. Small bowel loops are not dilated. Small bowel loops are mostly in right side of abdomen suggesting malrotation. There is no small bowel dilation. Appendix is not seen. Scattered diverticula are seen in colon. There is no evidence of focal acute diverticulitis. Vascular/Lymphatic: Arterial calcifications are seen. Reproductive: Uterus is not seen. There is questionable trace amount of free fluid in pelvis. Other: There is no pneumoperitoneum. There is mild edema in subcutaneous plane without any loculated fluid collections. Musculoskeletal: Degenerative changes are noted in lumbar spine, more so at L3-L4 level. IMPRESSION: There is no evidence of intestinal obstruction or pneumoperitoneum. There is no hydronephrosis. New patchy alveolar infiltrates are seen in both lower lung fields suggesting possible multifocal pneumonia. Small bilateral pleural effusions. Scattered diverticula are seen in colon without signs of focal acute diverticulitis. Aortic arteriosclerosis. Scattered coronary artery calcifications are seen. Right renal cyst. Lumbar spondylosis. Questionable trace amount of free fluid is seen in pelvis. This may be an artifact or suggest minimal ascites due to nonspecific enteritis. There is mild subcutaneous edema without any loculated fluid collections. Electronically Signed   By: Ernie Avena M.D.   On: 11/10/2022 14:22   DG Chest Port 1 View  Result Date: 11/09/2022 CLINICAL DATA:  Cough x3 weeks EXAM: PORTABLE CHEST - 1 VIEW COMPARISON:  10/10/2021 FINDINGS: New patchy airspace opacities in the lung bases right greater than left, and laterally in the right upper lung. Heart size and mediastinal contours are within normal limits. No effusion. Visualized bones unremarkable. IMPRESSION: New patchy bilateral airspace disease. Electronically Signed   By: Corlis Leak M.D.   On: 11/09/2022 13:44    Microbiology: Results for orders placed or  performed during the hospital encounter of 11/09/22  Blood culture (routine x 2)     Status: None   Collection Time: 11/09/22  1:08 PM   Specimen: BLOOD  Result Value Ref Range Status   Specimen Description BLOOD BLOOD LEFT ARM  Final   Special Requests   Final    BOTTLES DRAWN AEROBIC AND ANAEROBIC Blood Culture adequate volume   Culture   Final    NO GROWTH 5 DAYS Performed at [facility], [address], [place], [place] [address]    Report Status 11/14/2022 FINAL  Final  Blood culture (routine x 2)     Status: None   Collection Time: 11/09/22  1:35 PM   Specimen: BLOOD  Result Value Ref Range Status   Specimen Description BLOOD RIGHT ANTECUBITAL  Final   Special Requests   Final    BOTTLES DRAWN AEROBIC AND ANAEROBIC Blood Culture adequate volume   Culture   Final    NO GROWTH 5 DAYS Performed at [facility], [address] [place], [place] [address]    Report Status 11/14/2022 FINAL  Final  Resp panel by RT-PCR (RSV, Flu A&B, Covid) Anterior Nasal Swab     Status: None   Collection Time: 11/09/22  3:41 PM   Specimen: Anterior Nasal Swab  Result Value Ref Range Status   SARS Coronavirus 2 by RT PCR NEGATIVE NEGATIVE Final    Comment: (NOTE) SARS-CoV-2 target nucleic acids are NOT DETECTED.  The SARS-CoV-2 RNA is generally detectable in upper respiratory specimens during the acute phase of infection. The lowest concentration of SARS-CoV-2 viral copies this assay can detect is 138 copies/mL. A negative result does not preclude SARS-Cov-2 infection and should not be used as the sole basis for treatment or other patient management decisions. A negative result may occur with  improper specimen collection/handling, submission of specimen other than nasopharyngeal swab, presence of viral mutation(s) within the areas targeted by this assay, and inadequate number of viral copies(<138 copies/mL). A negative result must be combined with clinical  observations, patient history, and epidemiological information. The expected result is Negative.  Fact Sheet for Patients:  BloggerCourse.com  Fact Sheet for Healthcare Providers:  SeriousBroker.it  This test is no t yet approved or cleared by the [place] FDA and  has been authorized for detection and/or diagnosis of SARS-CoV-2 by FDA under an Emergency Use Authorization (EUA). This EUA will remain  in effect (meaning this test can be used) for the duration of the COVID-19 declaration under Section 564(b)(1) of the Act, 21 U.S.C.section 360bbb-3(b)(1), unless the authorization is terminated  or revoked sooner.       Influenza A by PCR NEGATIVE NEGATIVE Final   Influenza B by PCR NEGATIVE NEGATIVE Final    Comment: (NOTE) The Xpert Xpress SARS-CoV-2/FLU/RSV plus assay is intended as an aid in the diagnosis of influenza from Nasopharyngeal swab specimens and should not be used as a sole basis for treatment. Nasal washings and aspirates are unacceptable for Xpert Xpress SARS-CoV-2/FLU/RSV testing.  Fact Sheet for Patients: BloggerCourse.com  Fact Sheet for Healthcare Providers: SeriousBroker.it  This test is not yet approved or cleared by the [place] FDA and has been authorized for detection and/or diagnosis of SARS-CoV-2 by FDA under an Emergency Use Authorization (EUA). This EUA will remain in effect (meaning this test can be used) for the duration of the COVID-19 declaration under Section 564(b)(1) of the Act, 21 U.S.C. section 360bbb-3(b)(1), unless the authorization is terminated or revoked.     Resp Syncytial Virus by PCR NEGATIVE NEGATIVE Final    Comment: (NOTE) Fact Sheet for Patients: BloggerCourse.com  Fact Sheet for Healthcare Providers: SeriousBroker.it  This test is not yet approved or cleared by  the [place] FDA and has been authorized for detection and/or diagnosis of SARS-CoV-2 by FDA under an Emergency Use Authorization (EUA). This EUA will remain in effect (meaning this test can be used) for the duration of the COVID-19 declaration under Section 564(b)(1) of the Act, 21 U.S.C. section 360bbb-3(b)(1), unless the authorization is terminated or revoked.  Performed at [facility], [address] [place], [place] [address]   MRSA Next Gen by PCR, Nasal     Status: None   Collection Time: 11/10/22 10:25 AM   Specimen: Nasal Mucosa; Nasal  Swab  Result Value Ref Range Status   MRSA by PCR Next Gen NOT DETECTED NOT DETECTED Final    Comment: (NOTE) The GeneXpert MRSA Assay (FDA approved for NASAL specimens only), is one component of a comprehensive MRSA colonization surveillance program. It is not intended to diagnose MRSA infection nor to guide or monitor treatment for MRSA infections. Test performance is not FDA approved in patients less than 29 years old. Performed at [facility], [address] [place], [place] [address]   Expectorated Sputum Assessment w Gram Stain, Rflx to Resp Cult     Status: None   Collection Time: 11/10/22 10:25 AM   Specimen: Expectorated Sputum  Result Value Ref Range Status   Specimen Description EXPECTORATED SPUTUM  Final   Special Requests NONE  Final   Sputum evaluation   Final    THIS SPECIMEN IS ACCEPTABLE FOR SPUTUM CULTURE Performed at [facility], [address], [place], [place] [address]    Report Status 11/10/2022 FINAL  Final  Culture, Respiratory w Gram Stain     Status: None   Collection Time: 11/10/22 10:25 AM  Result Value Ref Range Status   Specimen Description   Final    EXPECTORATED SPUTUM Performed at [facility], [address], [place], [place] [address]    Special Requests   Final    NONE Reflexed from [id number] Performed at [facility], [address] [place], [place] [address]    Gram Stain   Final    ABUNDANT WBC PRESENT, PREDOMINANTLY PMN ABUNDANT YEAST WITH PSEUDOHYPHAE Performed at [facility], [address] [place], [place] [address]    Culture FEW CANDIDA ALBICANS  Final   Report Status 11/12/2022 FINAL  Final  C Difficile Quick Screen w PCR reflex     Status: None   Collection Time: 11/11/22 12:50 PM  Result Value Ref Range Status   C Diff antigen NEGATIVE NEGATIVE Final   C Diff toxin NEGATIVE NEGATIVE Final   C Diff interpretation No C. difficile detected.  Final    Comment: Performed at [facility], [address] [place], [place] [address]    Labs: CBC: Recent Labs  Lab 11/10/22 0530 11/11/22 0618 11/12/22 0409 11/13/22 0356 11/14/22 0338  WBC 12.5* 26.7* 18.5* 14.0* 15.6*  NEUTROABS  --  23.7* 14.0* 9.4* 9.9*  HGB 7.4* 7.9* 7.9* 7.7* 7.4*  HCT 23.2* 24.7* 24.9* 24.3* 23.2*  MCV 98.3 98.8 97.6 97.2 96.3  PLT 473* 531* 542* 542* 536*   Basic Metabolic Panel: Recent Labs  Lab 11/09/22 1308 11/10/22 0530 11/11/22 0618 11/12/22 0409 11/13/22 0356  NA 138 139 140 141 140  K 4.0 4.2 4.6 4.2 4.1  CL 105 113* 112* 113* 108  CO2 20* 20* 20* 22 23  GLUCOSE 91 138* 91 83 94  BUN 25* 19 26* 37* 34*  CREATININE 2.32* 1.73* 1.59* 1.54* 1.43*  CALCIUM 8.5* 7.5* 8.1* 8.4* 8.5*  MG  --   --  2.0  --  2.1  PHOS  --   --  2.9  --   --    Liver Function Tests: Recent Labs  Lab 11/09/22 1308 11/10/22 0530  AST 31 20  ALT 18 14  ALKPHOS 141* 183*  BILITOT 0.6 0.3  PROT 5.8* 4.4*  ALBUMIN 2.1* 1.6*   CBG: No results for input(s): "GLUCAP" in the last 168 hours.  Discharge time spent: greater than 30 minutes.  Signed: Lurene Shadow,  MD [facility] 11/14/2022

## 2022-11-14 NOTE — Plan of Care (Signed)

## 2022-11-24 ENCOUNTER — Encounter: Payer: Self-pay | Admitting: Urgent Care

## 2022-11-26 ENCOUNTER — Ambulatory Visit: Payer: Commercial Managed Care - HMO | Admitting: Physician Assistant

## 2022-12-02 ENCOUNTER — Other Ambulatory Visit: Payer: Self-pay

## 2022-12-02 ENCOUNTER — Emergency Department (HOSPITAL_COMMUNITY): Payer: Commercial Managed Care - HMO

## 2022-12-02 ENCOUNTER — Inpatient Hospital Stay (HOSPITAL_COMMUNITY)
Admission: EM | Admit: 2022-12-02 | Discharge: 2022-12-05 | DRG: 377 | Disposition: A | Discharge status: Home / Self Care | Payer: Commercial Managed Care - HMO | Attending: Internal Medicine | Admitting: Internal Medicine

## 2022-12-02 DIAGNOSIS — K269 Duodenal ulcer, unspecified as acute or chronic, without hemorrhage or perforation: Secondary | ICD-10-CM

## 2022-12-02 DIAGNOSIS — R1310 Dysphagia, unspecified: Secondary | ICD-10-CM | POA: Diagnosis present

## 2022-12-02 DIAGNOSIS — Z800 Family history of malignant neoplasm of digestive organs: Secondary | ICD-10-CM

## 2022-12-02 DIAGNOSIS — K253 Acute gastric ulcer without hemorrhage or perforation: Secondary | ICD-10-CM

## 2022-12-02 DIAGNOSIS — D62 Acute posthemorrhagic anemia: Secondary | ICD-10-CM | POA: Diagnosis present

## 2022-12-02 DIAGNOSIS — N183 Chronic kidney disease, stage 3 unspecified: Secondary | ICD-10-CM | POA: Diagnosis present

## 2022-12-02 DIAGNOSIS — K648 Other hemorrhoids: Secondary | ICD-10-CM | POA: Diagnosis present

## 2022-12-02 DIAGNOSIS — K264 Chronic or unspecified duodenal ulcer with hemorrhage: Principal | ICD-10-CM | POA: Diagnosis present

## 2022-12-02 DIAGNOSIS — F1010 Alcohol abuse, uncomplicated: Secondary | ICD-10-CM | POA: Diagnosis present

## 2022-12-02 DIAGNOSIS — Z79890 Hormone replacement therapy (postmenopausal): Secondary | ICD-10-CM

## 2022-12-02 DIAGNOSIS — I10 Essential (primary) hypertension: Secondary | ICD-10-CM | POA: Diagnosis present

## 2022-12-02 DIAGNOSIS — K5730 Diverticulosis of large intestine without perforation or abscess without bleeding: Secondary | ICD-10-CM | POA: Diagnosis present

## 2022-12-02 DIAGNOSIS — D509 Iron deficiency anemia, unspecified: Secondary | ICD-10-CM | POA: Diagnosis present

## 2022-12-02 DIAGNOSIS — F17210 Nicotine dependence, cigarettes, uncomplicated: Secondary | ICD-10-CM | POA: Diagnosis present

## 2022-12-02 DIAGNOSIS — F1590 Other stimulant use, unspecified, uncomplicated: Secondary | ICD-10-CM | POA: Diagnosis present

## 2022-12-02 DIAGNOSIS — J387 Other diseases of larynx: Secondary | ICD-10-CM | POA: Diagnosis present

## 2022-12-02 DIAGNOSIS — K922 Gastrointestinal hemorrhage, unspecified: Principal | ICD-10-CM | POA: Diagnosis present

## 2022-12-02 DIAGNOSIS — G8929 Other chronic pain: Secondary | ICD-10-CM | POA: Diagnosis present

## 2022-12-02 DIAGNOSIS — N1832 Chronic kidney disease, stage 3b: Secondary | ICD-10-CM | POA: Diagnosis present

## 2022-12-02 DIAGNOSIS — E878 Other disorders of electrolyte and fluid balance, not elsewhere classified: Secondary | ICD-10-CM | POA: Diagnosis present

## 2022-12-02 DIAGNOSIS — K254 Chronic or unspecified gastric ulcer with hemorrhage: Secondary | ICD-10-CM | POA: Diagnosis present

## 2022-12-02 DIAGNOSIS — G894 Chronic pain syndrome: Secondary | ICD-10-CM | POA: Diagnosis present

## 2022-12-02 DIAGNOSIS — I129 Hypertensive chronic kidney disease with stage 1 through stage 4 chronic kidney disease, or unspecified chronic kidney disease: Secondary | ICD-10-CM | POA: Diagnosis present

## 2022-12-02 DIAGNOSIS — K219 Gastro-esophageal reflux disease without esophagitis: Secondary | ICD-10-CM | POA: Diagnosis present

## 2022-12-02 DIAGNOSIS — F1120 Opioid dependence, uncomplicated: Secondary | ICD-10-CM | POA: Diagnosis present

## 2022-12-02 DIAGNOSIS — Z79899 Other long term (current) drug therapy: Secondary | ICD-10-CM

## 2022-12-02 DIAGNOSIS — M549 Dorsalgia, unspecified: Secondary | ICD-10-CM | POA: Diagnosis present

## 2022-12-02 DIAGNOSIS — K315 Obstruction of duodenum: Secondary | ICD-10-CM

## 2022-12-02 DIAGNOSIS — Z808 Family history of malignant neoplasm of other organs or systems: Secondary | ICD-10-CM

## 2022-12-02 DIAGNOSIS — E872 Acidosis, unspecified: Secondary | ICD-10-CM | POA: Diagnosis present

## 2022-12-02 DIAGNOSIS — F1190 Opioid use, unspecified, uncomplicated: Secondary | ICD-10-CM | POA: Diagnosis present

## 2022-12-02 DIAGNOSIS — Z803 Family history of malignant neoplasm of breast: Secondary | ICD-10-CM

## 2022-12-02 DIAGNOSIS — E43 Unspecified severe protein-calorie malnutrition: Secondary | ICD-10-CM | POA: Diagnosis present

## 2022-12-02 DIAGNOSIS — Z8619 Personal history of other infectious and parasitic diseases: Secondary | ICD-10-CM

## 2022-12-02 DIAGNOSIS — Z8701 Personal history of pneumonia (recurrent): Secondary | ICD-10-CM

## 2022-12-02 DIAGNOSIS — Z841 Family history of disorders of kidney and ureter: Secondary | ICD-10-CM

## 2022-12-02 DIAGNOSIS — Z8616 Personal history of COVID-19: Secondary | ICD-10-CM

## 2022-12-02 DIAGNOSIS — Z681 Body mass index (BMI) 19 or less, adult: Secondary | ICD-10-CM

## 2022-12-02 DIAGNOSIS — K921 Melena: Secondary | ICD-10-CM | POA: Diagnosis present

## 2022-12-02 DIAGNOSIS — F1020 Alcohol dependence, uncomplicated: Secondary | ICD-10-CM | POA: Diagnosis present

## 2022-12-02 DIAGNOSIS — L97929 Non-pressure chronic ulcer of unspecified part of left lower leg with unspecified severity: Secondary | ICD-10-CM | POA: Diagnosis present

## 2022-12-02 DIAGNOSIS — N179 Acute kidney failure, unspecified: Secondary | ICD-10-CM | POA: Diagnosis present

## 2022-12-02 DIAGNOSIS — K529 Noninfective gastroenteritis and colitis, unspecified: Secondary | ICD-10-CM | POA: Diagnosis present

## 2022-12-02 DIAGNOSIS — J449 Chronic obstructive pulmonary disease, unspecified: Secondary | ICD-10-CM | POA: Diagnosis present

## 2022-12-02 DIAGNOSIS — R195 Other fecal abnormalities: Secondary | ICD-10-CM

## 2022-12-02 DIAGNOSIS — E039 Hypothyroidism, unspecified: Secondary | ICD-10-CM | POA: Diagnosis present

## 2022-12-02 LAB — CBC WITH DIFFERENTIAL/PLATELET
Abs Immature Granulocytes: 0.08 10*3/uL — ABNORMAL HIGH (ref 0.00–0.07)
Basophils Absolute: 0.1 10*3/uL (ref 0.0–0.1)
Basophils Relative: 1 %
Eosinophils Absolute: 0 10*3/uL (ref 0.0–0.5)
Eosinophils Relative: 0 %
HCT: 27.7 % — ABNORMAL LOW (ref 36.0–46.0)
Hemoglobin: 8.2 g/dL — ABNORMAL LOW (ref 12.0–15.0)
Immature Granulocytes: 1 %
Lymphocytes Relative: 15 %
Lymphs Abs: 1.4 10*3/uL (ref 0.7–4.0)
MCH: 31.8 pg (ref 26.0–34.0)
MCHC: 29.6 g/dL — ABNORMAL LOW (ref 30.0–36.0)
MCV: 107.4 fL — ABNORMAL HIGH (ref 80.0–100.0)
Monocytes Absolute: 0.6 10*3/uL (ref 0.1–1.0)
Monocytes Relative: 7 %
Neutro Abs: 7.5 10*3/uL (ref 1.7–7.7)
Neutrophils Relative %: 76 %
Platelets: 415 10*3/uL — ABNORMAL HIGH (ref 150–400)
RBC: 2.58 MIL/uL — ABNORMAL LOW (ref 3.87–5.11)
RDW: 18.6 % — ABNORMAL HIGH (ref 11.5–15.5)
WBC: 9.7 10*3/uL (ref 4.0–10.5)
nRBC: 0 % (ref 0.0–0.2)

## 2022-12-02 LAB — I-STAT CHEM 8, ED
BUN: 32 mg/dL — ABNORMAL HIGH (ref 6–20)
Calcium, Ion: 1.08 mmol/L — ABNORMAL LOW (ref 1.15–1.40)
Chloride: 108 mmol/L (ref 98–111)
Creatinine, Ser: 2.5 mg/dL — ABNORMAL HIGH (ref 0.44–1.00)
Glucose, Bld: 94 mg/dL (ref 70–99)
HCT: 25 % — ABNORMAL LOW (ref 36.0–46.0)
Hemoglobin: 8.5 g/dL — ABNORMAL LOW (ref 12.0–15.0)
Potassium: 4 mmol/L (ref 3.5–5.1)
Sodium: 141 mmol/L (ref 135–145)
TCO2: 25 mmol/L (ref 22–32)

## 2022-12-02 LAB — COMPREHENSIVE METABOLIC PANEL
ALT: 14 U/L (ref 0–44)
AST: 22 U/L (ref 15–41)
Albumin: 2.1 g/dL — ABNORMAL LOW (ref 3.5–5.0)
Alkaline Phosphatase: 101 U/L (ref 38–126)
Anion gap: 18 — ABNORMAL HIGH (ref 5–15)
BUN: 31 mg/dL — ABNORMAL HIGH (ref 6–20)
CO2: 21 mmol/L — ABNORMAL LOW (ref 22–32)
Calcium: 8.9 mg/dL (ref 8.9–10.3)
Chloride: 104 mmol/L (ref 98–111)
Creatinine, Ser: 2.58 mg/dL — ABNORMAL HIGH (ref 0.44–1.00)
GFR, Estimated: 21 mL/min — ABNORMAL LOW (ref >60)
Glucose, Bld: 101 mg/dL — ABNORMAL HIGH (ref 70–99)
Potassium: 4.3 mmol/L (ref 3.5–5.1)
Sodium: 143 mmol/L (ref 135–145)
Total Bilirubin: 0.6 mg/dL (ref 0.3–1.2)
Total Protein: 5.2 g/dL — ABNORMAL LOW (ref 6.5–8.1)

## 2022-12-02 LAB — ETHANOL: Alcohol, Ethyl (B): <10 mg/dL (ref <10)

## 2022-12-02 LAB — POC OCCULT BLOOD, ED: Fecal Occult Bld: POSITIVE — AB

## 2022-12-02 LAB — LACTIC ACID, PLASMA: Lactic Acid, Venous: 1.5 mmol/L (ref 0.5–1.9)

## 2022-12-02 LAB — LIPASE, BLOOD: Lipase: 29 U/L (ref 11–51)

## 2022-12-02 MED ORDER — SODIUM CHLORIDE 0.9 % IV BOLUS (SEPSIS): 1000.0000 mL | Freq: Once | INTRAVENOUS | Status: DC

## 2022-12-02 MED ORDER — ONDANSETRON HCL 4 MG/2ML IJ SOLN
4.0000 mg | Freq: Once | INTRAMUSCULAR | Status: AC
  Administered 2022-12-02: 4 mg via INTRAVENOUS
  Filled 2022-12-02: qty 2

## 2022-12-02 MED ORDER — MORPHINE SULFATE (PF) 4 MG/ML IV SOLN
4.0000 mg | Freq: Once | INTRAVENOUS | Status: AC
  Administered 2022-12-02: 4 mg via INTRAVENOUS
  Filled 2022-12-02: qty 1

## 2022-12-02 MED ORDER — PANTOPRAZOLE 80MG IVPB - SIMPLE MED
80.0000 mg | Freq: Once | INTRAVENOUS | Status: AC
  Administered 2022-12-03: 80 mg via INTRAVENOUS
  Filled 2022-12-02: qty 100

## 2022-12-02 MED ORDER — SODIUM CHLORIDE 0.9 % IV SOLN
1000.0000 mL | INTRAVENOUS | Status: DC
  Administered 2022-12-02: 1000 mL via INTRAVENOUS

## 2022-12-02 MED ORDER — PANTOPRAZOLE SODIUM 40 MG IV SOLR: 40.0000 mg | Freq: Two times a day (BID) | INTRAVENOUS | Status: DC

## 2022-12-02 MED ORDER — PANTOPRAZOLE INFUSION (NEW) - SIMPLE MED
8.0000 mg/h | INTRAVENOUS | Status: DC
  Administered 2022-12-03 (×4): 8 mg/h via INTRAVENOUS
  Filled 2022-12-02 (×6): qty 100

## 2022-12-02 MED ORDER — SODIUM CHLORIDE 0.9 % IV BOLUS (SEPSIS)
1000.0000 mL | Freq: Once | INTRAVENOUS | Status: AC
  Administered 2022-12-02: 1000 mL via INTRAVENOUS

## 2022-12-02 MED ORDER — SODIUM CHLORIDE 0.9 % IV SOLN: 1000.0000 mL | INTRAVENOUS | Status: DC

## 2022-12-02 NOTE — ED Provider Notes (Signed)
EMERGENCY DEPARTMENT AT [facility] Provider Note   CSN: [id number] Arrival date & time: 12/02/22  2129     History  Chief Complaint  Patient presents with   Abdominal Pain   Vomiting    [name] is a 62 y.o. female.   Abdominal Pain    Patient has a history of chronic abdominal pain, alcohol abuse, acute kidney injury, hypertension, COPD.  Patient presents to the ED with complaints of abdominal pain.  Patient states symptoms started a few days ago.  She has had episodes of nausea and vomiting.  She has had dark emesis and dark stools.  Patient has not been able to get out of bed and has been feeling very weak.  Devious records reviewed indicate patient was admitted to the hospital on June 4 for severe sepsis secondary to pneumonia.  She was in the hospital she did have a CT scan of her abdomen and pelvis.  Home Medications Prior to Admission medications   Medication Sig Start Date End Date Taking? Authorizing Provider  albuterol (VENTOLIN HFA) 108 (90 Base) MCG/ACT inhaler Inhale 2 puffs into the lungs every 6 (six) hours as needed for wheezing or shortness of breath. 06/26/19   [provider]  amoxicillin-clavulanate (AUGMENTIN) 875-125 MG tablet Take 1 tablet by mouth 2 (two) times daily. 11/14/22   Lurene Shadow, MD  Cholecalciferol (VITAMIN D-3) 5000 UNITS TABS Take 10,000 Units by mouth daily after breakfast. Patient not taking: Reported on 11/09/2022    [provider]  cyclobenzaprine (FLEXERIL) 10 MG tablet Take 10 mg by mouth 2 (two) times daily as needed. 09/27/22   [provider]  ferrous sulfate 325 (65 FE) MG tablet Take 1 tablet (325 mg total) by mouth every other day. 06/02/22 08/01/22  Kendell Bane, MD  HYDROcodone-acetaminophen (NORCO/VICODIN) 5-325 MG tablet Take 2 tablets by mouth 3 (three) times daily as needed. 10/06/22   [provider]  levothyroxine (SYNTHROID, LEVOTHROID) 50 MCG tablet Take 50  mcg by mouth daily before breakfast.    [provider]  lisinopril (ZESTRIL) 10 MG tablet Take 10 mg by mouth daily. 07/15/19   [provider]  naloxone [facility]) nasal spray 4 mg/0.1 mL Place 1 spray into the nose once. 09/27/22   [provider]  ondansetron (ZOFRAN-ODT) 8 MG disintegrating tablet Take 8 mg by mouth 2 (two) times daily as needed for nausea. 09/04/21   [provider]  pantoprazole (PROTONIX) 40 MG tablet Take 1 tablet (40 mg total) by mouth 2 (two) times daily for 10 days. 06/01/22 06/11/22  ShahmehdiGemma Payor, MD  rosuvastatin (CRESTOR) 5 MG tablet Take 1 tablet (5 mg total) by mouth every evening. For cholesterol. Patient not taking: Reported on 11/09/2022 09/17/19   Doreene Nest, NP  traZODone (DESYREL) 50 MG tablet Take 50-100 mg by mouth at bedtime. 10/17/22   [provider]      Allergies    Patient has no known allergies.    Review of Systems   Review of Systems  Gastrointestinal:  Positive for abdominal pain.    Physical Exam Updated Vital Signs BP 125/71   Pulse (!) 123   Temp 98 F (36.7 C) (Oral)   Resp 14   Wt 43.2 kg   SpO2 100%   BMI 19.90 kg/m  Physical Exam Vitals and nursing note reviewed.  Constitutional:      Appearance: She is ill-appearing.     Comments: Underweight  HENT:     Head: Normocephalic and atraumatic.     Comments: Poor dentition    Right Ear: External ear normal.     Left Ear: External ear normal.  Eyes:     General: No scleral icterus.       Right eye: No discharge.        Left eye: No discharge.     Conjunctiva/sclera: Conjunctivae normal.  Neck:     Trachea: No tracheal deviation.  Cardiovascular:     Rate and Rhythm: Normal rate and regular rhythm.  Pulmonary:     Effort: Pulmonary effort is normal. No respiratory distress.     Breath sounds: Normal breath sounds. No stridor. No wheezing or rales.  Abdominal:     General: Bowel sounds are normal. There is no  distension.     Palpations: Abdomen is soft.     Tenderness: There is generalized abdominal tenderness. There is no guarding or rebound.  Musculoskeletal:        General: No tenderness or deformity.     Cervical back: Neck supple.  Skin:    General: Skin is warm and dry.     Findings: No rash.  Neurological:     General: No focal deficit present.     Mental Status: She is alert.     Cranial Nerves: No cranial nerve deficit, dysarthria or facial asymmetry.     Sensory: No sensory deficit.     Motor: No abnormal muscle tone or seizure activity.     Coordination: Coordination normal.  Psychiatric:        Mood and Affect: Mood normal.     ED Results / Procedures / Treatments   Labs (all labs ordered are listed, but only abnormal results are displayed) Labs Reviewed  CBC WITH DIFFERENTIAL/PLATELET - Abnormal; Notable for the following components:      Result Value   RBC 2.58 (*)    Hemoglobin 8.2 (*)    HCT 27.7 (*)    MCV 107.4 (*)    MCHC 29.6 (*)    RDW 18.6 (*)    Platelets 415 (*)    Abs Immature Granulocytes 0.08 (*)    All other components within normal limits  I-STAT CHEM 8, ED - Abnormal; Notable for the following components:   BUN 32 (*)    Creatinine, Ser 2.50 (*)    Calcium, Ion 1.08 (*)    Hemoglobin 8.5 (*)    HCT 25.0 (*)    All other components within normal limits  POC OCCULT BLOOD, ED - Abnormal; Notable for the following components:   Fecal Occult Bld POSITIVE (*)    All other components within normal limits  COMPREHENSIVE METABOLIC PANEL  LIPASE, BLOOD  LACTIC ACID, PLASMA  LACTIC ACID, PLASMA  RAPID URINE DRUG SCREEN, HOSP PERFORMED  ETHANOL  URINALYSIS, W/ REFLEX TO CULTURE (INFECTION SUSPECTED)    EKG None  Radiology DG Chest Portable 1 View  Result Date: 12/02/2022 CLINICAL DATA:  Weakness and abdominal pain. EXAM: PORTABLE CHEST 1 VIEW COMPARISON:  PA Lat 11/14/2022 FINDINGS: The heart size and mediastinal contours are within normal  limits. Calcific plaques in the aortic arch are again shown. Both lungs are clear. The visualized skeletal structures are unremarkable. Multiple overlying monitor wires especially on the left. IMPRESSION: No evidence of acute chest disease.  Aortic atherosclerosis. Electronically Signed   By: Almira Bar M.D.   On: 12/02/2022 22:55    Procedures Procedures    Medications Ordered in  ED Medications  sodium chloride 0.9 % bolus 1,000 mL (1,000 mLs Intravenous New Bag/Given 12/02/22 2242)    Followed by  0.9 %  sodium chloride infusion (1,000 mLs Intravenous New Bag/Given 12/02/22 2245)  pantoprazole (PROTONIX) 80 mg /NS 100 mL IVPB (has no administration in time range)  pantoprozole (PROTONIX) 80 mg /NS 100 mL infusion (has no administration in time range)  pantoprazole (PROTONIX) injection 40 mg (has no administration in time range)  morphine (PF) 4 MG/ML injection 4 mg (4 mg Intravenous Given 12/02/22 2241)  ondansetron (ZOFRAN) injection 4 mg (4 mg Intravenous Given 12/02/22 2240)    ED Course/ Medical Decision Making/ A&P Clinical Course as of 12/02/22 2308  Thu Dec 02, 2022  2307 Hemoglobin is stable compared to previous. [JK]  2307 I-stat chem 8, ED (not at [facility], [facility] or [facility])(!) Creatinine elevated compared to previous [JK]  2307 Hemoccult is positive [JK]    Clinical Course User Index [JK] Linwood Dibbles, MD                             Medical Decision Making Amount and/or Complexity of Data Reviewed Labs: ordered. Decision-making details documented in ED Course. Radiology: ordered.  Risk Prescription drug management.   Patient presented to the ER for evaluation of abdominal pain.  Records indicate patient has history of chronic abdominal pain issues.  However today she reports episodes of dark emesis and dark stools.  Patient is tachycardic and appears to be in pain.  Will start her on antacids.  No indication for blood transfusion at this time.  Laboratory tests do show an  AKI.  Will continue with IV fluid and proceed with CT scan to evaluate for acute abdominal process.  Care turned over to Dr Nicanor Alcon at shift change.        Final Clinical Impression(s) / ED Diagnoses Final diagnoses:  None    Rx / DC Orders ED Discharge Orders     None         Linwood Dibbles, MD 12/02/22 2309

## 2022-12-02 NOTE — ED Triage Notes (Signed)
Pt via EMS from home for abdominal pain and nausea and vomiting. Reports onset few weeks ago and she has been in bed for last few days d/t feeling unwell.

## 2022-12-03 ENCOUNTER — Encounter (HOSPITAL_COMMUNITY): Payer: Self-pay | Admitting: Family Medicine

## 2022-12-03 DIAGNOSIS — R1319 Other dysphagia: Secondary | ICD-10-CM | POA: Diagnosis not present

## 2022-12-03 DIAGNOSIS — K922 Gastrointestinal hemorrhage, unspecified: Secondary | ICD-10-CM | POA: Diagnosis present

## 2022-12-03 DIAGNOSIS — K254 Chronic or unspecified gastric ulcer with hemorrhage: Secondary | ICD-10-CM | POA: Diagnosis present

## 2022-12-03 DIAGNOSIS — K269 Duodenal ulcer, unspecified as acute or chronic, without hemorrhage or perforation: Secondary | ICD-10-CM | POA: Diagnosis not present

## 2022-12-03 DIAGNOSIS — R1310 Dysphagia, unspecified: Secondary | ICD-10-CM | POA: Diagnosis present

## 2022-12-03 DIAGNOSIS — K315 Obstruction of duodenum: Secondary | ICD-10-CM | POA: Diagnosis present

## 2022-12-03 DIAGNOSIS — D509 Iron deficiency anemia, unspecified: Secondary | ICD-10-CM | POA: Diagnosis present

## 2022-12-03 DIAGNOSIS — N183 Chronic kidney disease, stage 3 unspecified: Secondary | ICD-10-CM | POA: Diagnosis not present

## 2022-12-03 DIAGNOSIS — E43 Unspecified severe protein-calorie malnutrition: Secondary | ICD-10-CM | POA: Diagnosis present

## 2022-12-03 DIAGNOSIS — K921 Melena: Secondary | ICD-10-CM

## 2022-12-03 DIAGNOSIS — E039 Hypothyroidism, unspecified: Secondary | ICD-10-CM | POA: Diagnosis present

## 2022-12-03 DIAGNOSIS — F17210 Nicotine dependence, cigarettes, uncomplicated: Secondary | ICD-10-CM | POA: Diagnosis present

## 2022-12-03 DIAGNOSIS — I129 Hypertensive chronic kidney disease with stage 1 through stage 4 chronic kidney disease, or unspecified chronic kidney disease: Secondary | ICD-10-CM | POA: Diagnosis present

## 2022-12-03 DIAGNOSIS — D62 Acute posthemorrhagic anemia: Secondary | ICD-10-CM | POA: Diagnosis present

## 2022-12-03 DIAGNOSIS — N179 Acute kidney failure, unspecified: Secondary | ICD-10-CM

## 2022-12-03 DIAGNOSIS — F1020 Alcohol dependence, uncomplicated: Secondary | ICD-10-CM | POA: Diagnosis present

## 2022-12-03 DIAGNOSIS — E872 Acidosis, unspecified: Secondary | ICD-10-CM | POA: Diagnosis present

## 2022-12-03 DIAGNOSIS — F1590 Other stimulant use, unspecified, uncomplicated: Secondary | ICD-10-CM | POA: Diagnosis present

## 2022-12-03 DIAGNOSIS — R195 Other fecal abnormalities: Secondary | ICD-10-CM | POA: Diagnosis not present

## 2022-12-03 DIAGNOSIS — Z681 Body mass index (BMI) 19 or less, adult: Secondary | ICD-10-CM | POA: Diagnosis not present

## 2022-12-03 DIAGNOSIS — J449 Chronic obstructive pulmonary disease, unspecified: Secondary | ICD-10-CM

## 2022-12-03 DIAGNOSIS — K5903 Drug induced constipation: Secondary | ICD-10-CM

## 2022-12-03 DIAGNOSIS — Z79890 Hormone replacement therapy (postmenopausal): Secondary | ICD-10-CM | POA: Diagnosis not present

## 2022-12-03 DIAGNOSIS — L97929 Non-pressure chronic ulcer of unspecified part of left lower leg with unspecified severity: Secondary | ICD-10-CM | POA: Diagnosis present

## 2022-12-03 DIAGNOSIS — K259 Gastric ulcer, unspecified as acute or chronic, without hemorrhage or perforation: Secondary | ICD-10-CM | POA: Diagnosis not present

## 2022-12-03 DIAGNOSIS — E878 Other disorders of electrolyte and fluid balance, not elsewhere classified: Secondary | ICD-10-CM | POA: Diagnosis present

## 2022-12-03 DIAGNOSIS — F1120 Opioid dependence, uncomplicated: Secondary | ICD-10-CM | POA: Diagnosis present

## 2022-12-03 DIAGNOSIS — F1010 Alcohol abuse, uncomplicated: Secondary | ICD-10-CM | POA: Diagnosis not present

## 2022-12-03 DIAGNOSIS — G894 Chronic pain syndrome: Secondary | ICD-10-CM | POA: Diagnosis present

## 2022-12-03 DIAGNOSIS — N1832 Chronic kidney disease, stage 3b: Secondary | ICD-10-CM | POA: Diagnosis present

## 2022-12-03 DIAGNOSIS — K264 Chronic or unspecified duodenal ulcer with hemorrhage: Secondary | ICD-10-CM | POA: Diagnosis present

## 2022-12-03 DIAGNOSIS — Z8616 Personal history of COVID-19: Secondary | ICD-10-CM | POA: Diagnosis not present

## 2022-12-03 DIAGNOSIS — K274 Chronic or unspecified peptic ulcer, site unspecified, with hemorrhage: Secondary | ICD-10-CM | POA: Diagnosis not present

## 2022-12-03 LAB — BASIC METABOLIC PANEL
Anion gap: 13 (ref 5–15)
BUN: 29 mg/dL — ABNORMAL HIGH (ref 6–20)
CO2: 19 mmol/L — ABNORMAL LOW (ref 22–32)
Calcium: 7.7 mg/dL — ABNORMAL LOW (ref 8.9–10.3)
Chloride: 112 mmol/L — ABNORMAL HIGH (ref 98–111)
Creatinine, Ser: 2.19 mg/dL — ABNORMAL HIGH (ref 0.44–1.00)
GFR, Estimated: 25 mL/min — ABNORMAL LOW (ref >60)
Glucose, Bld: 88 mg/dL (ref 70–99)
Potassium: 4.1 mmol/L (ref 3.5–5.1)
Sodium: 144 mmol/L (ref 135–145)

## 2022-12-03 LAB — TYPE AND SCREEN: Unit division: 0

## 2022-12-03 LAB — URINALYSIS, W/ REFLEX TO CULTURE (INFECTION SUSPECTED)
Bilirubin Urine: NEGATIVE
Glucose, UA: NEGATIVE mg/dL
Hgb urine dipstick: NEGATIVE
Ketones, ur: NEGATIVE mg/dL
Leukocytes,Ua: NEGATIVE
Nitrite: NEGATIVE
Protein, ur: NEGATIVE mg/dL
Specific Gravity, Urine: 1.012 (ref 1.005–1.030)
pH: 7 (ref 5.0–8.0)

## 2022-12-03 LAB — VITAMIN B12: Vitamin B-12: 4349 pg/mL — ABNORMAL HIGH (ref 180–914)

## 2022-12-03 LAB — PHOSPHORUS: Phosphorus: 2.8 mg/dL (ref 2.5–4.6)

## 2022-12-03 LAB — HEMOGLOBIN AND HEMATOCRIT, BLOOD
HCT: 19.6 % — ABNORMAL LOW (ref 36.0–46.0)
HCT: 32.8 % — ABNORMAL LOW (ref 36.0–46.0)
Hemoglobin: 5.7 g/dL — CL (ref 12.0–15.0)
Hemoglobin: 9.8 g/dL — ABNORMAL LOW (ref 12.0–15.0)

## 2022-12-03 LAB — BPAM RBC
Blood Product Expiration Date: 202407282359
Blood Product Expiration Date: 202407282359
ISSUE DATE / TIME: 202406281313
Unit Type and Rh: 5100

## 2022-12-03 LAB — IRON AND TIBC
Iron: 22 ug/dL — ABNORMAL LOW (ref 28–170)
Saturation Ratios: 14 % (ref 10.4–31.8)
TIBC: 153 ug/dL — ABNORMAL LOW (ref 250–450)
UIBC: 131 ug/dL

## 2022-12-03 LAB — FOLATE: Folate: 10.1 ng/mL (ref >5.9)

## 2022-12-03 LAB — RAPID URINE DRUG SCREEN, HOSP PERFORMED
Amphetamines: NOT DETECTED
Barbiturates: NOT DETECTED
Benzodiazepines: NOT DETECTED
Cocaine: NOT DETECTED
Opiates: POSITIVE — AB
Tetrahydrocannabinol: NOT DETECTED

## 2022-12-03 LAB — RETICULOCYTES
Immature Retic Fract: 42.7 % — ABNORMAL HIGH (ref 2.3–15.9)
RBC.: 1.97 MIL/uL — ABNORMAL LOW (ref 3.87–5.11)
Retic Count, Absolute: 84.7 10*3/uL (ref 19.0–186.0)
Retic Ct Pct: 4.3 % — ABNORMAL HIGH (ref 0.4–3.1)

## 2022-12-03 LAB — PREPARE RBC (CROSSMATCH)

## 2022-12-03 LAB — LACTIC ACID, PLASMA: Lactic Acid, Venous: 1.7 mmol/L (ref 0.5–1.9)

## 2022-12-03 LAB — PROTIME-INR
INR: 1.1 (ref 0.8–1.2)
Prothrombin Time: 14.5 seconds (ref 11.4–15.2)

## 2022-12-03 LAB — FERRITIN: Ferritin: 42 ng/mL (ref 11–307)

## 2022-12-03 LAB — AMMONIA: Ammonia: 25 umol/L (ref 9–35)

## 2022-12-03 LAB — MAGNESIUM: Magnesium: 1.9 mg/dL (ref 1.7–2.4)

## 2022-12-03 MED ORDER — THIAMINE HCL 100 MG/ML IJ SOLN
100.0000 mg | Freq: Every day | INTRAMUSCULAR | Status: DC
  Administered 2022-12-03: 100 mg via INTRAVENOUS
  Filled 2022-12-03: qty 2

## 2022-12-03 MED ORDER — SODIUM CHLORIDE 0.9% IV SOLUTION: Freq: Once | INTRAVENOUS | Status: AC

## 2022-12-03 MED ORDER — ADULT MULTIVITAMIN W/MINERALS CH
1.0000 | ORAL_TABLET | Freq: Every day | ORAL | Status: DC
  Administered 2022-12-04 – 2022-12-05 (×2): 1 via ORAL
  Filled 2022-12-03 (×2): qty 1

## 2022-12-03 MED ORDER — LORAZEPAM 2 MG/ML IJ SOLN: 1.0000 mg | INTRAMUSCULAR | Status: DC | PRN

## 2022-12-03 MED ORDER — SODIUM CHLORIDE 0.9% FLUSH: 10.0000 mL | INTRAVENOUS | Status: DC | PRN

## 2022-12-03 MED ORDER — THIAMINE MONONITRATE 100 MG PO TABS
100.0000 mg | ORAL_TABLET | Freq: Every day | ORAL | Status: DC
  Administered 2022-12-04 – 2022-12-05 (×2): 100 mg via ORAL
  Filled 2022-12-03 (×2): qty 1

## 2022-12-03 MED ORDER — FOLIC ACID 1 MG PO TABS
1.0000 mg | ORAL_TABLET | Freq: Every day | ORAL | Status: DC
  Administered 2022-12-04 – 2022-12-05 (×2): 1 mg via ORAL
  Filled 2022-12-03 (×2): qty 1

## 2022-12-03 MED ORDER — MORPHINE SULFATE (PF) 2 MG/ML IV SOLN
1.0000 mg | INTRAVENOUS | Status: DC | PRN
  Administered 2022-12-03 – 2022-12-04 (×3): 1 mg via INTRAVENOUS
  Filled 2022-12-03 (×3): qty 1

## 2022-12-03 MED ORDER — LIDOCAINE 5 % EX OINT
TOPICAL_OINTMENT | Freq: Two times a day (BID) | CUTANEOUS | Status: DC
  Filled 2022-12-03 (×2): qty 35.44

## 2022-12-03 MED ORDER — ONDANSETRON HCL 4 MG/2ML IJ SOLN
4.0000 mg | Freq: Four times a day (QID) | INTRAMUSCULAR | Status: DC | PRN
  Administered 2022-12-03: 4 mg via INTRAVENOUS
  Filled 2022-12-03: qty 2

## 2022-12-03 MED ORDER — LINACLOTIDE 145 MCG PO CAPS
290.0000 ug | ORAL_CAPSULE | Freq: Every day | ORAL | Status: DC
  Administered 2022-12-04: 290 ug via ORAL
  Filled 2022-12-03 (×2): qty 2

## 2022-12-03 MED ORDER — ACETAMINOPHEN 325 MG PO TABS
650.0000 mg | ORAL_TABLET | Freq: Four times a day (QID) | ORAL | Status: DC | PRN
  Administered 2022-12-03 – 2022-12-04 (×2): 650 mg via ORAL
  Filled 2022-12-03 (×2): qty 2

## 2022-12-03 MED ORDER — DILTIAZEM GEL 2 %: Freq: Two times a day (BID) | CUTANEOUS | Status: DC

## 2022-12-03 MED ORDER — LEVOTHYROXINE SODIUM 50 MCG PO TABS: 50.0000 ug | ORAL_TABLET | Freq: Every day | ORAL | Status: DC

## 2022-12-03 MED ORDER — SODIUM CHLORIDE 0.9% FLUSH
10.0000 mL | Freq: Two times a day (BID) | INTRAVENOUS | Status: DC
  Administered 2022-12-03 – 2022-12-04 (×2): 10 mL

## 2022-12-03 MED ORDER — LACTATED RINGERS IV SOLN: INTRAVENOUS | Status: DC

## 2022-12-03 MED ORDER — LACTATED RINGERS IV BOLUS
500.0000 mL | Freq: Once | INTRAVENOUS | Status: AC
  Administered 2022-12-03: 500 mL via INTRAVENOUS

## 2022-12-03 MED ORDER — ACETAMINOPHEN 650 MG RE SUPP: 650.0000 mg | Freq: Four times a day (QID) | RECTAL | Status: DC | PRN

## 2022-12-03 MED ORDER — SODIUM CHLORIDE 0.9 % IV SOLN
250.0000 mg | Freq: Every day | INTRAVENOUS | Status: AC
  Administered 2022-12-04 – 2022-12-05 (×2): 250 mg via INTRAVENOUS
  Filled 2022-12-03 (×3): qty 20

## 2022-12-03 MED ORDER — POLYETHYLENE GLYCOL 3350 17 G PO PACK
17.0000 g | PACK | Freq: Two times a day (BID) | ORAL | Status: DC
  Filled 2022-12-03 (×2): qty 1

## 2022-12-03 MED ORDER — LORAZEPAM 1 MG PO TABS: 1.0000 mg | ORAL_TABLET | ORAL | Status: DC | PRN

## 2022-12-03 NOTE — ED Notes (Signed)
ED TO INPATIENT HANDOFF REPORT  ED Nurse Name and Phone #: Amil Amen [phone]  S Name/Age/Gender Sheppard Plumber 62 y.o. female Room/Bed: 025C/025C  Code Status   Code Status: Full Code  Home/SNF/Other Home Patient oriented to: self, place, time, and situation Is this baseline? Yes   Triage Complete: Triage complete  Chief Complaint GI bleed [K92.2]  Triage Note Pt via EMS from home for abdominal pain and nausea and vomiting. Reports onset few weeks ago and she has been in bed for last few days d/t feeling unwell.    Allergies No Known Allergies  Level of Care/Admitting Diagnosis ED Disposition     ED Disposition  Admit   Condition  --   Comment  Hospital Area: [facility] [[id number]]  Level of Care: Telemetry Medical [104]  May place patient in observation at [facility] or [facility] if equivalent level of care is available:: Yes  Covid Evaluation: Confirmed COVID Negative  Diagnosis: GI bleed [454098]  Admitting Physician: Gery Pray [[id number]]  Attending Physician: Alvester Chou          B Medical/Surgery History Past Medical History:  Diagnosis Date   Acute diverticulitis 07/16/2013   Acute renal failure (HCC) 07/16/2013   Arthritis    hands, back, knees   Colitis 07/16/2013   Diverticulitis    Elevated CEA 08/15/2017   GERD (gastroesophageal reflux disease)    GIB (gastrointestinal bleeding) 01/07/2012   Hypertension    somewhat controlled, taking medication   Hypothyroidism    Intractable nausea and vomiting 05/30/2022   Lesion of epiglottis    Macrocytosis without anemia 08/15/2017   Orthostasis 08/22/2017   Past Surgical History:  Procedure Laterality Date   ABDOMINAL HYSTERECTOMY     APPENDECTOMY     CHOLECYSTECTOMY     COLONOSCOPY  01/09/2012   Procedure: COLONOSCOPY;  Surgeon: Shirley Friar, MD;  Location: [facility] ENDOSCOPY;  Service: Endoscopy;  Laterality: N/A;   COLONOSCOPY WITH PROPOFOL N/A 08/25/2017   Procedure:  COLONOSCOPY WITH PROPOFOL;  Surgeon: Wyline Mood, MD;  Location: [facility] ENDOSCOPY;  Service: Gastroenterology;  Laterality: N/A;   ESOPHAGOGASTRODUODENOSCOPY  01/07/2012   Procedure: ESOPHAGOGASTRODUODENOSCOPY (EGD);  Surgeon: Vertell Novak., MD;  Location: [facility] ENDOSCOPY;  Service: Endoscopy;  Laterality: N/A;   ESOPHAGOGASTRODUODENOSCOPY (EGD) WITH PROPOFOL N/A 08/25/2017   Procedure: ESOPHAGOGASTRODUODENOSCOPY (EGD) WITH PROPOFOL;  Surgeon: Wyline Mood, MD;  Location: [facility] ENDOSCOPY;  Service: Gastroenterology;  Laterality: N/A;   VIDEO BRONCHOSCOPY Bilateral 05/14/2016   Procedure: VIDEO BRONCHOSCOPY WITHOUT FLUORO;  Surgeon: Leslye Peer, MD;  Location: [facility] ENDOSCOPY;  Service: Cardiopulmonary;  Laterality: Bilateral;     A IV Location/Drains/Wounds Patient Lines/Drains/Airways Status     Active Line/Drains/Airways     Name Placement date Placement time Site Days   Peripheral IV 12/02/22 22 G Anterior;Left Forearm 12/02/22  2148  Forearm  1            Intake/Output Last 24 hours  Intake/Output Summary (Last 24 hours) at 12/03/2022 1191 Last data filed at 12/03/2022 0027 Gross per 24 hour  Intake 1120 ml  Output --  Net 1120 ml    Labs/Imaging Results for orders placed or performed during the hospital encounter of 12/02/22 (from the past 48 hour(s))  POC occult blood, ED     Status: Abnormal   Collection Time: 12/02/22 10:06 PM  Result Value Ref Range   Fecal Occult Bld POSITIVE (A) NEGATIVE  Comprehensive metabolic panel     Status: Abnormal  Collection Time: 12/02/22 10:36 PM  Result Value Ref Range   Sodium 143 135 - 145 mmol/L   Potassium 4.3 3.5 - 5.1 mmol/L   Chloride 104 98 - 111 mmol/L   CO2 21 (L) 22 - 32 mmol/L   Glucose, Bld 101 (H) 70 - 99 mg/dL    Comment: Glucose reference range applies only to samples taken after fasting for at least 8 hours.   BUN 31 (H) 6 - 20 mg/dL   Creatinine, Ser 8.11 (H) 0.44 - 1.00 mg/dL   Calcium 8.9 8.9 - 91.4 mg/dL    Total Protein 5.2 (L) 6.5 - 8.1 g/dL   Albumin 2.1 (L) 3.5 - 5.0 g/dL   AST 22 15 - 41 U/L   ALT 14 0 - 44 U/L   Alkaline Phosphatase 101 38 - 126 U/L   Total Bilirubin 0.6 0.3 - 1.2 mg/dL   GFR, Estimated 21 (L) >60 mL/min    Comment: (NOTE) Calculated using the CKD-EPI Creatinine Equation (2021)    Anion gap 18 (H) 5 - 15    Comment: Performed at [facility], [address], [place], [place] [address]  Lipase, blood     Status: None   Collection Time: 12/02/22 10:36 PM  Result Value Ref Range   Lipase 29 11 - 51 U/L    Comment: Performed at [facility], [address], [place], [place] [address]  CBC with Diff     Status: Abnormal   Collection Time: 12/02/22 10:36 PM  Result Value Ref Range   WBC 9.7 4.0 - 10.5 K/uL   RBC 2.58 (L) 3.87 - 5.11 MIL/uL   Hemoglobin 8.2 (L) 12.0 - 15.0 g/dL   HCT 86.5 (L) 78.4 - 69.6 %   MCV 107.4 (H) 80.0 - 100.0 fL   MCH 31.8 26.0 - 34.0 pg   MCHC 29.6 (L) 30.0 - 36.0 g/dL   RDW 29.5 (H) 28.4 - 13.2 %   Platelets 415 (H) 150 - 400 K/uL   nRBC 0.0 0.0 - 0.2 %   Neutrophils Relative % 76 %   Neutro Abs 7.5 1.7 - 7.7 K/uL   Lymphocytes Relative 15 %   Lymphs Abs 1.4 0.7 - 4.0 K/uL   Monocytes Relative 7 %   Monocytes Absolute 0.6 0.1 - 1.0 K/uL   Eosinophils Relative 0 %   Eosinophils Absolute 0.0 0.0 - 0.5 K/uL   Basophils Relative 1 %   Basophils Absolute 0.1 0.0 - 0.1 K/uL   Immature Granulocytes 1 %   Abs Immature Granulocytes 0.08 (H) 0.00 - 0.07 K/uL    Comment: Performed at [facility], [address] [place], [place] [address]  Lactic acid, plasma     Status: None   Collection Time: 12/02/22 10:36 PM  Result Value Ref Range   Lactic Acid, Venous 1.5 0.5 - 1.9 mmol/L    Comment: Performed at [facility], [address], [place], [place] [address]  Ethanol     Status: None   Collection Time: 12/02/22 10:37 PM  Result Value Ref Range   Alcohol, Ethyl (B) <10 <10 mg/dL    Comment: (NOTE) Lowest  detectable limit for serum alcohol is 10 mg/dL.  For medical purposes only. Performed at [facility], [address] [place], [place] [address]   I-stat chem 8, ED (not at [facility], [facility] or [facility])     Status: Abnormal   Collection Time: 12/02/22 10:43 PM  Result Value Ref Range   Sodium 141 135 - 145 mmol/L   Potassium 4.0 3.5 - 5.1 mmol/L   Chloride 108 98 - 111 mmol/L   BUN 32 (H) 6 - 20 mg/dL   Creatinine, Ser 4.09 (H) 0.44 - 1.00 mg/dL   Glucose, Bld 94 70 - 99 mg/dL    Comment: Glucose reference range applies only to samples taken after fasting for at least 8 hours.   Calcium, Ion 1.08 (L) 1.15 - 1.40 mmol/L   TCO2 25 22 - 32 mmol/L   Hemoglobin 8.5 (L) 12.0 - 15.0 g/dL   HCT 81.1 (L) 91.4 - 78.2 %  Lactic acid, plasma     Status: None   Collection Time: 12/03/22 12:45 AM  Result Value Ref Range   Lactic Acid, Venous 1.7 0.5 - 1.9 mmol/L    Comment: Performed at [facility], [address] [place], [place] [address]  Reticulocytes     Status: Abnormal   Collection Time: 12/03/22  2:37 AM  Result Value Ref Range   Retic Ct Pct 4.3 (H) 0.4 - 3.1 %   RBC. 1.97 (L) 3.87 - 5.11 MIL/uL   Retic Count, Absolute 84.7 19.0 - 186.0 K/uL   Immature Retic Fract 42.7 (H) 2.3 - 15.9 %    Comment: Performed at [facility], [address] [place], [place] [address]   CT ABDOMEN PELVIS WO CONTRAST  Result Date: 12/03/2022 CLINICAL DATA:  Abdominal pain, acute, nonlocalized, nausea, vomiting EXAM: CT ABDOMEN AND PELVIS WITHOUT CONTRAST TECHNIQUE: Multidetector CT imaging of the abdomen and pelvis was performed following the standard protocol without IV contrast. RADIATION DOSE REDUCTION: This exam was performed according to the departmental dose-optimization program which includes automated exposure control, adjustment of the mA and/or kV according to patient size and/or use of iterative reconstruction technique. COMPARISON:  11/10/2022 FINDINGS: Lower chest: Previously noted  bibasilar pulmonary consolidation has improved with mild residual atelectasis or infiltrate within the visualized right lung base. Cardiac size within normal limits. Hepatobiliary: No focal liver abnormality is seen. Status post cholecystectomy. No biliary dilatation. Pancreas: Unremarkable. No pancreatic ductal dilatation or surrounding inflammatory changes. Spleen: Unremarkable Adrenals/Urinary Tract: Adrenal glands are unremarkable. Kidneys are normal, without renal calculi, focal lesion, or hydronephrosis. Bladder is unremarkable. Stomach/Bowel: Moderate descending sigmoid colonic diverticulosis without superimposed acute inflammatory change. Similar to prior examination, there is moderate fluid distension of the stomach with decompression of the duodenum which is nonspecific. While this can be seen in the setting of gastroparesis or gastric outlet obstruction, small-bowel follow-through examination performed 02/05/2022 demonstrates normal emptying of the stomach. The stomach, small bowel, and large bowel are otherwise unremarkable. No free intraperitoneal gas or fluid. Vascular/Lymphatic: Moderate aortoiliac atherosclerotic calcification. No aortic aneurysm. No pathologic adenopathy within the abdomen and pelvis. Reproductive: Status post hysterectomy. No adnexal masses. Other: No abdominal wall hernia Musculoskeletal: No acute bone abnormality. No lytic or blastic bone lesion. IMPRESSION: 1. No acute intra-abdominal pathology identified. 2. Moderate fluid distension of the stomach, nonspecific. Normal gastric emptying noted on prior small-bowel follow-through examination of 02/05/2022. 3. Moderate distal colonic diverticulosis without superimposed acute inflammatory change. 4. Interval improvement in bibasilar pulmonary consolidation with mild residual atelectasis or infiltrate within the visualized right lung base. Aortic Atherosclerosis (ICD10-I70.0). Electronically Signed   By: Helyn Numbers M.D.   On:  12/03/2022 00:24   DG Chest Portable 1 View  Result Date: 12/02/2022 CLINICAL DATA:  Weakness and abdominal pain. EXAM: PORTABLE CHEST 1 VIEW  COMPARISON:  PA Lat 11/14/2022 FINDINGS: The heart size and mediastinal contours are within normal limits. Calcific plaques in the aortic arch are again shown. Both lungs are clear. The visualized skeletal structures are unremarkable. Multiple overlying monitor wires especially on the left. IMPRESSION: No evidence of acute chest disease.  Aortic atherosclerosis. Electronically Signed   By: Almira Bar M.D.   On: 12/02/2022 22:55    Pending Labs Unresulted Labs (From admission, onward)     Start     Ordered   12/03/22 0700  Hemoglobin and hematocrit, blood  Now then every 8 hours,   R      12/03/22 0222   12/03/22 0500  Protime-INR  Tomorrow morning,   R        12/03/22 0242   12/03/22 0301  Ammonia  Once,   R        12/03/22 0300   12/03/22 0300  Phosphorus  Add-on,   AD        12/03/22 0259   12/03/22 0251  Type and screen [facility]  Once,   R       Comments: [facility]    12/03/22 0250   12/03/22 0237  Basic metabolic panel  Once,   R        12/03/22 0237   12/03/22 0237  Magnesium  Once,   R        12/03/22 0237   12/03/22 0223  Vitamin B12  (Anemia Panel (PNL))  Once,   URGENT        12/03/22 0222   12/03/22 0223  Folate  (Anemia Panel (PNL))  Once,   URGENT        12/03/22 0222   12/03/22 0223  Iron and TIBC  (Anemia Panel (PNL))  Once,   URGENT        12/03/22 0222   12/03/22 0223  Ferritin  (Anemia Panel (PNL))  Once,   URGENT        12/03/22 0222   12/02/22 2208  Rapid urine drug screen (hospital performed)  ONCE - STAT,   STAT        12/02/22 2207   12/02/22 2208  Urinalysis, w/ Reflex to Culture (Infection Suspected) -Urine, Clean Catch  Once,   URGENT       Question:  Specimen Source  Answer:  Urine, Clean Catch   12/02/22 2207            Vitals/Pain Today's Vitals   12/03/22  0215 12/03/22 0230 12/03/22 0245 12/03/22 0300  BP: 98/62 111/68 107/63 105/60  Pulse: (!) 115 (!) 116 (!) 114 (!) 115  Resp: 11 12 12 20   Temp:      TempSrc:      SpO2: 98% 98% 98% 98%  Weight:      Height:      PainSc:        Isolation Precautions No active isolations  Medications Medications  sodium chloride 0.9 % bolus 1,000 mL (0 mLs Intravenous Stopped 12/02/22 2312)    Followed by  0.9 %  sodium chloride infusion (0 mLs Intravenous Stopped 12/03/22 0306)  pantoprozole (PROTONIX) 80 mg /NS 100 mL infusion (8 mg/hr Intravenous New Bag/Given 12/03/22 0029)  pantoprazole (PROTONIX) injection 40 mg (has no administration in time range)  lactated ringers infusion (has no administration in time range)  acetaminophen (TYLENOL) tablet 650 mg (has no administration in time range)    Or  acetaminophen (TYLENOL) suppository 650 mg (  has no administration in time range)  LORazepam (ATIVAN) tablet 1-4 mg (has no administration in time range)    Or  LORazepam (ATIVAN) injection 1-4 mg (has no administration in time range)  thiamine (VITAMIN B1) tablet 100 mg ( Oral See Alternative 12/03/22 0311)    Or  thiamine (VITAMIN B1) injection 100 mg (100 mg Intravenous Given 12/03/22 0311)  folic acid (FOLVITE) tablet 1 mg (1 mg Oral Not Given 12/03/22 0308)  multivitamin with minerals tablet 1 tablet (1 tablet Oral Not Given 12/03/22 0309)  morphine (PF) 4 MG/ML injection 4 mg (4 mg Intravenous Given 12/02/22 2241)  ondansetron (ZOFRAN) injection 4 mg (4 mg Intravenous Given 12/02/22 2240)  pantoprazole (PROTONIX) 80 mg /NS 100 mL IVPB (0 mg Intravenous Stopped 12/03/22 0027)  lactated ringers bolus 500 mL (500 mLs Intravenous New Bag/Given 12/03/22 0308)    Mobility non-ambulatory     Focused Assessments Cardiac Assessment Handoff:    Lab Results  Component Value Date   CKTOTAL 20 (L) 12/29/2012   CKMB < 0.5 (L) 12/29/2012   TROPONINI < 0.02 06/09/2014   No results found for:  "DDIMER" Does the Patient currently have chest pain? No    R Recommendations: See Admitting Provider Note  Report given to:   Additional Notes: pt has + hemoocult, on protonix gtt. Hgb 8.5 (higher than previous) has lab draw due at 0500, please ensure lab draws Type & Screen and ammonia.)

## 2022-12-03 NOTE — H&P (Addendum)
PCP:   [facility]   Chief Complaint:  Abdominal pain  HPI: This is a 61 year old female with past medical history of alcohol abuse, polysubstance abuse, CKD stage III, chronic abdominal pain, IDA, COPD, HTN, hypothyroidism, history of GI bleed and extensive diverticulosis.  Per patient she presents with complaints of severe abdominal pain, 10/10.  She has chronic abdominal pain but is much worse than baseline.  She endorses nausea and vomiting earlier today.  She also reports diarrhea but she has a h/o chronic diarrhea.  She states she has been lightheaded and dizzy for the past 2 weeks.  His last colonoscopy done 2019 showed diverticulosis of the entire colon.  She has not had no repeat EGD or colonoscopy.  She has a history of alcohol dependence, her last drink was last night - wine cooler.  Per patient she drinks every other day.  She states she has poor appetite.  In the ER patient stool was checked, she was guaiac positive.  Hemoglobin 8.2.  Patient with chronic neck anemia.  Review of Systems:  Per HPI  Past Medical History: Past Medical History:  Diagnosis Date   Acute diverticulitis 07/16/2013   Acute renal failure (HCC) 07/16/2013   Arthritis    hands, back, knees   Colitis 07/16/2013   Diverticulitis    Elevated CEA 08/15/2017   GERD (gastroesophageal reflux disease)    GIB (gastrointestinal bleeding) 01/07/2012   Hypertension    somewhat controlled, taking medication   Hypothyroidism    Intractable nausea and vomiting 05/30/2022   Lesion of epiglottis    Macrocytosis without anemia 08/15/2017   Orthostasis 08/22/2017   Past Surgical History:  Procedure Laterality Date   ABDOMINAL HYSTERECTOMY     APPENDECTOMY     CHOLECYSTECTOMY     COLONOSCOPY  01/09/2012   Procedure: COLONOSCOPY;  Surgeon: Shirley Friar, MD;  Location: [facility] ENDOSCOPY;  Service: Endoscopy;  Laterality: N/A;   COLONOSCOPY WITH PROPOFOL N/A 08/25/2017   Procedure: COLONOSCOPY WITH PROPOFOL;   Surgeon: Wyline Mood, MD;  Location: [facility] ENDOSCOPY;  Service: Gastroenterology;  Laterality: N/A;   ESOPHAGOGASTRODUODENOSCOPY  01/07/2012   Procedure: ESOPHAGOGASTRODUODENOSCOPY (EGD);  Surgeon: Vertell Novak., MD;  Location: [facility] ENDOSCOPY;  Service: Endoscopy;  Laterality: N/A;   ESOPHAGOGASTRODUODENOSCOPY (EGD) WITH PROPOFOL N/A 08/25/2017   Procedure: ESOPHAGOGASTRODUODENOSCOPY (EGD) WITH PROPOFOL;  Surgeon: Wyline Mood, MD;  Location: [facility] ENDOSCOPY;  Service: Gastroenterology;  Laterality: N/A;   VIDEO BRONCHOSCOPY Bilateral 05/14/2016   Procedure: VIDEO BRONCHOSCOPY WITHOUT FLUORO;  Surgeon: Leslye Peer, MD;  Location: [facility] ENDOSCOPY;  Service: Cardiopulmonary;  Laterality: Bilateral;    Medications: Prior to Admission medications   Medication Sig Start Date End Date Taking? Authorizing Provider  albuterol (VENTOLIN HFA) 108 (90 Base) MCG/ACT inhaler Inhale 2 puffs into the lungs every 6 (six) hours as needed for wheezing or shortness of breath. 06/26/19   [provider]  amoxicillin-clavulanate (AUGMENTIN) 875-125 MG tablet Take 1 tablet by mouth 2 (two) times daily. 11/14/22   Lurene Shadow, MD  Cholecalciferol (VITAMIN D-3) 5000 UNITS TABS Take 10,000 Units by mouth daily after breakfast. Patient not taking: Reported on 11/09/2022    [provider]  cyclobenzaprine (FLEXERIL) 10 MG tablet Take 10 mg by mouth 2 (two) times daily as needed. 09/27/22   [provider]  ferrous sulfate 325 (65 FE) MG tablet Take 1 tablet (325 mg total) by mouth every other day. 06/02/22 08/01/22  Kendell Bane, MD  HYDROcodone-acetaminophen (NORCO/VICODIN) 5-325 MG tablet  Take 2 tablets by mouth 3 (three) times daily as needed. 10/06/22   [provider]  levothyroxine (SYNTHROID, LEVOTHROID) 50 MCG tablet Take 50 mcg by mouth daily before breakfast.    [provider]  lisinopril (ZESTRIL) 10 MG tablet Take 10 mg by mouth daily. 07/15/19   [provider]  naloxone [facility]) nasal spray 4 mg/0.1 mL Place 1 spray into the nose once. 09/27/22   [provider]  ondansetron (ZOFRAN-ODT) 8 MG disintegrating tablet Take 8 mg by mouth 2 (two) times daily as needed for nausea. 09/04/21   [provider]  pantoprazole (PROTONIX) 40 MG tablet Take 1 tablet (40 mg total) by mouth 2 (two) times daily for 10 days. 06/01/22 06/11/22  ShahmehdiGemma Payor, MD  rosuvastatin (CRESTOR) 5 MG tablet Take 1 tablet (5 mg total) by mouth every evening. For cholesterol. Patient not taking: Reported on 11/09/2022 09/17/19   Doreene Nest, NP  traZODone (DESYREL) 50 MG tablet Take 50-100 mg by mouth at bedtime. 10/17/22   [provider]    Allergies:  No Known Allergies  Social History:  reports that she has been smoking cigarettes. She has a 84.00 pack-year smoking history. She has never used smokeless tobacco. She reports current alcohol use of about 35.0 standard drinks of alcohol per week. She reports that she does not use drugs.  Family History: Family History  Problem Relation Age of Onset   Breast cancer Mother        multiple types of cancer   Stomach cancer Mother    Throat cancer Mother    Melanoma Mother    Kidney disease Father     Physical Exam: Vitals:   12/03/22 0100 12/03/22 0140 12/03/22 0145 12/03/22 0215  BP: 111/67  104/64 98/62  Pulse: (!) 119 (!) 116 (!) 116 (!) 115  Resp: 20 14 17 11   Temp:  98 F (36.7 C)    TempSrc:      SpO2: 99% 98% 98% 98%  Weight:      Height:        General:  Alert and oriented times three, small cachectic female Eyes: Pink conjunctiva, no scleral icterus ENT: Moist oral mucosa, neck supple, no thyromegaly Lungs: clear to ascultation, no wheeze, no crackles Cardiovascular: Tachycardia, regular rate and rhythm, no gallops, no murmurs. No JVD Abdomen: soft, positive BS, + generalized TTP  GU: not examined Neuro: CN II - XII grossly intact, sensation intact Musculoskeletal:  strength 5/5 all extremities, no clubbing, cyanosis or edema Skin: Excoriation bilateral lower extremities Psych: appropriate patient  Labs on Admission:  Recent Labs    12/02/22 2236 12/02/22 2243  NA 143 141  K 4.3 4.0  CL 104 108  CO2 21*  --   GLUCOSE 101* 94  BUN 31* 32*  CREATININE 2.58* 2.50*  CALCIUM 8.9  --     Recent Labs    12/02/22 2236  AST 22  ALT 14  ALKPHOS 101  BILITOT 0.6  PROT 5.2*  ALBUMIN 2.1*    Recent Labs    12/02/22 2236  LIPASE 29    Recent Labs    12/02/22 2236 12/02/22 2243  WBC 9.7  --   NEUTROABS 7.5  --   HGB 8.2* 8.5*  HCT 27.7* 25.0*  MCV 107.4*  --   PLT 415*  --      Radiological Exams on Admission: CT ABDOMEN PELVIS WO CONTRAST  Result Date: 12/03/2022 CLINICAL DATA:  Abdominal pain,  acute, nonlocalized, nausea, vomiting EXAM: CT ABDOMEN AND PELVIS WITHOUT CONTRAST TECHNIQUE: Multidetector CT imaging of the abdomen and pelvis was performed following the standard protocol without IV contrast. RADIATION DOSE REDUCTION: This exam was performed according to the departmental dose-optimization program which includes automated exposure control, adjustment of the mA and/or kV according to patient size and/or use of iterative reconstruction technique. COMPARISON:  11/10/2022 FINDINGS: Lower chest: Previously noted bibasilar pulmonary consolidation has improved with mild residual atelectasis or infiltrate within the visualized right lung base. Cardiac size within normal limits. Hepatobiliary: No focal liver abnormality is seen. Status post cholecystectomy. No biliary dilatation. Pancreas: Unremarkable. No pancreatic ductal dilatation or surrounding inflammatory changes. Spleen: Unremarkable Adrenals/Urinary Tract: Adrenal glands are unremarkable. Kidneys are normal, without renal calculi, focal lesion, or hydronephrosis. Bladder is unremarkable. Stomach/Bowel: Moderate descending sigmoid colonic diverticulosis without superimposed acute  inflammatory change. Similar to prior examination, there is moderate fluid distension of the stomach with decompression of the duodenum which is nonspecific. While this can be seen in the setting of gastroparesis or gastric outlet obstruction, small-bowel follow-through examination performed 02/05/2022 demonstrates normal emptying of the stomach. The stomach, small bowel, and large bowel are otherwise unremarkable. No free intraperitoneal gas or fluid. Vascular/Lymphatic: Moderate aortoiliac atherosclerotic calcification. No aortic aneurysm. No pathologic adenopathy within the abdomen and pelvis. Reproductive: Status post hysterectomy. No adnexal masses. Other: No abdominal wall hernia Musculoskeletal: No acute bone abnormality. No lytic or blastic bone lesion. IMPRESSION: 1. No acute intra-abdominal pathology identified. 2. Moderate fluid distension of the stomach, nonspecific. Normal gastric emptying noted on prior small-bowel follow-through examination of 02/05/2022. 3. Moderate distal colonic diverticulosis without superimposed acute inflammatory change. 4. Interval improvement in bibasilar pulmonary consolidation with mild residual atelectasis or infiltrate within the visualized right lung base. Aortic Atherosclerosis (ICD10-I70.0). Electronically Signed   By: Helyn Numbers M.D.   On: 12/03/2022 00:24   DG Chest Portable 1 View  Result Date: 12/02/2022 CLINICAL DATA:  Weakness and abdominal pain. EXAM: PORTABLE CHEST 1 VIEW COMPARISON:  PA Lat 11/14/2022 FINDINGS: The heart size and mediastinal contours are within normal limits. Calcific plaques in the aortic arch are again shown. Both lungs are clear. The visualized skeletal structures are unremarkable. Multiple overlying monitor wires especially on the left. IMPRESSION: No evidence of acute chest disease.  Aortic atherosclerosis. Electronically Signed   By: Almira Bar M.D.   On: 12/02/2022 22:55    Assessment/Plan Present on Admission:  GI  bleed/IDA/history of GI bleed/extensive diverticulosis -N.p.o. -H&H every 8 hours -Protonix drip started in ER -GI contacted by EDP, will see patient in a.m. -IVF hydration -Anemia panel ordered   Chronic abdominal pain  Moderate fluid distention in stomach -Defer to AM   team/GI if repeat SBFT needed -PRN hydrocodone on hold   Alcohol dependence  -CIWA initiated -Thiamine, folate, multivitamins ordered   Acute on chronic kidney disease stage III -IV fluid hydration. -BMP in a.m.   HLD/ HTN -Crestor on hold   Acquired hypothyroidism -Synthroid on hold   Amphetamine use// Chronic, continuous use of opioids  COPD (chronic obstructive pulmonary disease) (HCC)  H/o Lesion of epiglottis  [name] 12/03/2022, 2:50 AM

## 2022-12-03 NOTE — Progress Notes (Signed)
Lab call, pt Hgb-5.7, notified MD via secure chat waiting on response.  1209- MD place new orders, pt waiting for type and screen for blood transfusing.

## 2022-12-03 NOTE — Progress Notes (Signed)
[facility]                                                                               [name], is a 62 y.o. female, DOB - 1960-06-12, ZOX:[id number] Admit date - 12/02/2022    Outpatient Primary MD for the patient is [facility]  LOS - 0  days    Brief summary    62 year old female with past medical history of alcohol abuse, polysubstance abuse, CKD stage III, chronic abdominal pain, IDA, COPD, HTN, hypothyroidism, history of GI bleed and extensive diverticulosis.  Per patient she presents with complaints of severe abdominal pain, 10/10.  She has chronic abdominal pain but is much worse than baseline.  She endorses nausea and vomiting earlier today.  She states she has been lightheaded and dizzy for the past 2 weeks. His last colonoscopy done 2019 showed diverticulosis of the entire colon. she was guaiac positive. Hemoglobin 8.2    Assessment & Plan    Assessment and Plan:   GI bleed  Iron deficiency anemia In the setting of extensive diverticulosis IV PPI BID,  Continue to monitor H&H every 8 hours.  GI consulted and plan for EGD later today.    Acute anemia of blood loss:  Macrocytic. Vit b12 elevated.  Check folic acid.  2 units of prbc transfusion ordered.  Will probably need IV iron prior to discharge.   Acute on stage 3 a CKD; Creatinine at  baseline between 1. 5 to 1.7, admitted with a creatinine o f 2.5  , improved to 2.19 today.  Metabolic acidosis:  Improving.    Alcohol dependence:  Watch for withdrawal symptoms.  On CIWA.    Chronic abdominal pain:  - pain control.    Hypertension:  Well controlled.    Hypothyroidism: Resume synthroid when able to take oral.   COPD  Stable.    Chronic pain syndrome:  On Vicodin at home.   Estimated body mass index is 19.9 kg/m as calculated from the following:   Height as of this encounter: 4\' 10"  (1.473 m).   Weight as of this encounter: 43.2 kg.  Code Status: full  code.  DVT Prophylaxis:  SCDs Start: 12/03/22 0242   Level of Care: Level of care: Telemetry Medical Family Communication: family at bedside.   Disposition Plan:     Remains inpatient appropriate:  pending further work up for anemia.   Procedures:  EGD today.   Consultants:   gastroenterology  Antimicrobials:   Anti-infectives (From admission, onward)    None        Medications  Scheduled Meds:  sodium chloride   Intravenous Once   folic acid  1 mg Oral Daily   lidocaine   Topical BID   [START ON 12/04/2022] linaclotide  290 mcg Oral QAC breakfast   multivitamin with minerals  1 tablet Oral Daily   thiamine  100 mg Oral Daily   Or   thiamine  100 mg Intravenous Daily   Continuous Infusions:  sodium chloride Stopped (12/03/22 0306)   lactated ringers 75 mL/hr at 12/03/22 0517   pantoprazole 8 mg/hr (12/03/22 0930)   PRN Meds:.acetaminophen **OR** acetaminophen, LORazepam **  OR** LORazepam, ondansetron (ZOFRAN) IV    Subjective:   [name] was seen and examined today.  Not feeling great.   Objective:   Vitals:   12/03/22 0330 12/03/22 0345 12/03/22 0436 12/03/22 0729  BP: 102/62 104/62 110/65 112/64  Pulse: (!) 109 (!) 111 (!) 109 (!) 106  Resp: 12 13 15 16   Temp:   97.9 F (36.6 C) 98.1 F (36.7 C)  TempSrc:   Oral Oral  SpO2: 99% 100% 99% 98%  Weight:      Height:        Intake/Output Summary (Last 24 hours) at 12/03/2022 1155 Last data filed at 12/03/2022 0600 Gross per 24 hour  Intake 2275.41 ml  Output --  Net 2275.41 ml   Filed Weights   12/02/22 2136 12/03/22 0014  Weight: 43.2 kg 43.2 kg     Exam General exam: ill appearing lady moaning.  Respiratory system: Clear to auscultation. Respiratory effort normal. Cardiovascular system: S1 & S2 heard, RRR.  Gastrointestinal system: Abdomen is nondistended, soft and nontender.  Central nervous system: Alert and oriented. No focal neurological deficits. Extremities: Symmetric 5 x 5  power. Skin: No rashes,  Psychiatry: anxious.    Data Reviewed:  I have personally reviewed following labs and imaging studies   CBC Lab Results  Component Value Date   WBC 9.7 12/02/2022   RBC 1.97 (L) 12/03/2022   HGB 5.7 (LL) 12/03/2022   HCT 19.6 (L) 12/03/2022   MCV 107.4 (H) 12/02/2022   MCH 31.8 12/02/2022   PLT 415 (H) 12/02/2022   MCHC 29.6 (L) 12/02/2022   RDW 18.6 (H) 12/02/2022   LYMPHSABS 1.4 12/02/2022   MONOABS 0.6 12/02/2022   EOSABS 0.0 12/02/2022   BASOSABS 0.1 12/02/2022     Last metabolic panel Lab Results  Component Value Date   NA 144 12/03/2022   K 4.1 12/03/2022   CL 112 (H) 12/03/2022   CO2 19 (L) 12/03/2022   BUN 29 (H) 12/03/2022   CREATININE 2.19 (H) 12/03/2022   GLUCOSE 88 12/03/2022   GFRNONAA 25 (L) 12/03/2022   GFRAA 49 (L) 01/02/2020   CALCIUM 7.7 (L) 12/03/2022   PHOS 2.9 11/11/2022   PROT 5.2 (L) 12/02/2022   ALBUMIN 2.1 (L) 12/02/2022   LABGLOB 2.8 08/15/2017   BILITOT 0.6 12/02/2022   ALKPHOS 101 12/02/2022   AST 22 12/02/2022   ALT 14 12/02/2022   ANIONGAP 13 12/03/2022    CBG (last 3)  No results for input(s): "GLUCAP" in the last 72 hours.    Coagulation Profile: No results for input(s): "INR", "PROTIME" in the last 168 hours.   Radiology Studies: CT ABDOMEN PELVIS WO CONTRAST  Result Date: 12/03/2022 CLINICAL DATA:  Abdominal pain, acute, nonlocalized, nausea, vomiting EXAM: CT ABDOMEN AND PELVIS WITHOUT CONTRAST TECHNIQUE: Multidetector CT imaging of the abdomen and pelvis was performed following the standard protocol without IV contrast. RADIATION DOSE REDUCTION: This exam was performed according to the departmental dose-optimization program which includes automated exposure control, adjustment of the mA and/or kV according to patient size and/or use of iterative reconstruction technique. COMPARISON:  11/10/2022 FINDINGS: Lower chest: Previously noted bibasilar pulmonary consolidation has improved with mild  residual atelectasis or infiltrate within the visualized right lung base. Cardiac size within normal limits. Hepatobiliary: No focal liver abnormality is seen. Status post cholecystectomy. No biliary dilatation. Pancreas: Unremarkable. No pancreatic ductal dilatation or surrounding inflammatory changes. Spleen: Unremarkable Adrenals/Urinary Tract: Adrenal glands are unremarkable. Kidneys are normal, without renal calculi, focal  lesion, or hydronephrosis. Bladder is unremarkable. Stomach/Bowel: Moderate descending sigmoid colonic diverticulosis without superimposed acute inflammatory change. Similar to prior examination, there is moderate fluid distension of the stomach with decompression of the duodenum which is nonspecific. While this can be seen in the setting of gastroparesis or gastric outlet obstruction, small-bowel follow-through examination performed 02/05/2022 demonstrates normal emptying of the stomach. The stomach, small bowel, and large bowel are otherwise unremarkable. No free intraperitoneal gas or fluid. Vascular/Lymphatic: Moderate aortoiliac atherosclerotic calcification. No aortic aneurysm. No pathologic adenopathy within the abdomen and pelvis. Reproductive: Status post hysterectomy. No adnexal masses. Other: No abdominal wall hernia Musculoskeletal: No acute bone abnormality. No lytic or blastic bone lesion. IMPRESSION: 1. No acute intra-abdominal pathology identified. 2. Moderate fluid distension of the stomach, nonspecific. Normal gastric emptying noted on prior small-bowel follow-through examination of 02/05/2022. 3. Moderate distal colonic diverticulosis without superimposed acute inflammatory change. 4. Interval improvement in bibasilar pulmonary consolidation with mild residual atelectasis or infiltrate within the visualized right lung base. Aortic Atherosclerosis (ICD10-I70.0). Electronically Signed   By: Helyn Numbers M.D.   On: 12/03/2022 00:24   DG Chest Portable 1 View  Result  Date: 12/02/2022 CLINICAL DATA:  Weakness and abdominal pain. EXAM: PORTABLE CHEST 1 VIEW COMPARISON:  PA Lat 11/14/2022 FINDINGS: The heart size and mediastinal contours are within normal limits. Calcific plaques in the aortic arch are again shown. Both lungs are clear. The visualized skeletal structures are unremarkable. Multiple overlying monitor wires especially on the left. IMPRESSION: No evidence of acute chest disease.  Aortic atherosclerosis. Electronically Signed   By: Almira Bar M.D.   On: 12/02/2022 22:55       Kathlen Mody M.D. [facility] 12/03/2022, 11:55 AM  Available via Epic secure chat 7am-7pm After 7 pm, please refer to night coverage provider listed on amion.

## 2022-12-03 NOTE — Consult Note (Addendum)
Consultation  Referring Provider:   [facility] Primary Care Physician:  [facility] Primary Gastroenterologist:  [facility] Dr. Tobi Bastos       Reason for Consultation:     FOBT +, anemia         HPI:   [name] is a 62 y.o. female with past medical history significant for tobacco use, hypertension, hypothyroidism, hemorrhoids, diverticulosis, CIC, alcohol abuse, polysubstance abuse, CKD stage III, COPD, IDA presents to the ER severe abdominal pain associate with nausea and vomiting with diarrhea.  Admitting hemoglobin 8.2, MCV 107 FOBT positive (06/01/2022 Hgb 9, 3 weeks ago 9.5, during hospitalization for sepsis went down to 7.7 and 7.4 on discharge) B12 over 4000, folate 10, iron 22, saturation ratios 14, ferritin 42, retic count appropriate Platelets 415 BUN 31, creatinine 2.58, GFR 21 Albumin 2.1, AST 22, ALT 14, alk phos 101, total bilirubin 0.6 Lipase 29, ammonia level 25 Lactic acid 1.5, negative ethanol Pending urine and rapid drug screen CT abdomen pelvis without contrast in the ER showed no acute intra-abdominal pathology, moderate fluid distended stomach nonspecific, normal gastric emptying 02/05/2022, moderate distal colon diverticulosis without inflammation.  Bibasilar pulmonary consolidation with mild residual atelectasis or infiltrate right lung base Follows with [facility] last seen in the office 2021 for CIC and epigastric pain, 2019 EGD unrevealing, colonoscopy diverticulosis, mild inflammation hepatic flexure superficial thought to be due to NSAID use.  02/10/2022 ER visit duodenitis 05/30/2022 hospitalization for nausea and vomiting, positive COVID, diverticulitis, UTI, FOBT positive, states had colonoscopy November 2023 at [facility] which patient states was unremarkable, unable to see report. No EGD was done at that time.  11/10/2022-11/14/2022 admission for cough, shortness of breath diarrhea, abdominal pain found to have severe sepsis secondary to  pneumococcal pneumonia treated with Augmentin for 7 days.  Had positive opiates and tricyclics during that visit, history of oral thrush.  Patient states she has seen dark stools, denies pepto/iron.  She has had BRB in her stool as well, on TP and in toliet.  Has rectal pain, burning, itching pain.  She has hard stools 2-3 x a day, small volume, with straining and associated AB pain. Last BM yesterday evening.  She has GERD, has dsyphagia to liquids and occ solids with regurgitation no associated nausea with it. She has odynophagia for 1-2 months at least. Can seperately have nausea and occ vomit. No blood in vomit.  She has chronic AB pain but states this is worse, lower AB pain, no radiation, stabbing pain. She is passing gas, has a lot of gas, and bloating.  Patient states she has lost weight, unknown amount but states "quite a bit", decreased appetitie.   She is on norco TID for back pain. She has not been on protonix for 2-3 months. She denies NSAIDS, she states she "semi drinks ETOH" but according to chart more frequent, smokes tobacco use, no drug use.  No family was present at the time of my evaluation.  GI Procedures: EGD and colonoscopy 08/25/2017 - Normal examined duodenum. - Normal esophagus. - Normal stomach. Biopsied.   - Diverticulosis in the entire examined colon. - Localized mild inflammation was found at the hepatic flexure secondary to colitis. Biopsied. - The examination was otherwise normal on direct and retroflexion views.   DIAGNOSIS:  A.  RANDOM STOMACH; COLD BIOPSY:  - ANTRAL MUCOSA WITH FEATURES OF A HEALING MUCOSAL INJURY.  - OXYNTIC MUCOSA WITH PROTON PUMP INHIBITOR EFFECT.  - NEGATIVE  FOR H. PYLORI, DYSPLASIA AND MALIGNANCY.   B. COLON, HEPATIC FLEXURE; COLD BIOPSY:  - MILD ACTIVE INFLAMMATION, SUPERFICIAL CRYPT DROP OUT AND  FIBROSIS/HYALINIZATION OF THE LAMINA PROPRIA, SEE NOTE.  - NEGATIVE FOR DYSPLASIA AND MALIGNANCY.  - DEEPER LEVELS WERE EXAMINED.    Abnormal ED labs: Abnormal Labs Reviewed  COMPREHENSIVE METABOLIC PANEL - Abnormal; Notable for the following components:      Result Value   CO2 21 (*)    Glucose, Bld 101 (*)    BUN 31 (*)    Creatinine, Ser 2.58 (*)    Total Protein 5.2 (*)    Albumin 2.1 (*)    GFR, Estimated 21 (*)    Anion gap 18 (*)    All other components within normal limits  CBC WITH DIFFERENTIAL/PLATELET - Abnormal; Notable for the following components:   RBC 2.58 (*)    Hemoglobin 8.2 (*)    HCT 27.7 (*)    MCV 107.4 (*)    MCHC 29.6 (*)    RDW 18.6 (*)    Platelets 415 (*)    Abs Immature Granulocytes 0.08 (*)    All other components within normal limits  VITAMIN B12 - Abnormal; Notable for the following components:   Vitamin B-12 4,349 (*)    All other components within normal limits  IRON AND TIBC - Abnormal; Notable for the following components:   Iron 22 (*)    TIBC 153 (*)    All other components within normal limits  RETICULOCYTES - Abnormal; Notable for the following components:   Retic Ct Pct 4.3 (*)    RBC. 1.97 (*)    Immature Retic Fract 42.7 (*)    All other components within normal limits  BASIC METABOLIC PANEL - Abnormal; Notable for the following components:   Chloride 112 (*)    CO2 19 (*)    BUN 29 (*)    Creatinine, Ser 2.19 (*)    Calcium 7.7 (*)    GFR, Estimated 25 (*)    All other components within normal limits  I-STAT CHEM 8, ED - Abnormal; Notable for the following components:   BUN 32 (*)    Creatinine, Ser 2.50 (*)    Calcium, Ion 1.08 (*)    Hemoglobin 8.5 (*)    HCT 25.0 (*)    All other components within normal limits  POC OCCULT BLOOD, ED - Abnormal; Notable for the following components:   Fecal Occult Bld POSITIVE (*)    All other components within normal limits     Past Medical History:  Diagnosis Date   Acute diverticulitis 07/16/2013   Acute renal failure (HCC) 07/16/2013   Arthritis    hands, back, knees   Colitis 07/16/2013   Diverticulitis     Elevated CEA 08/15/2017   GERD (gastroesophageal reflux disease)    GIB (gastrointestinal bleeding) 01/07/2012   Hypertension    somewhat controlled, taking medication   Hypothyroidism    Intractable nausea and vomiting 05/30/2022   Lesion of epiglottis    Macrocytosis without anemia 08/15/2017   Orthostasis 08/22/2017    Surgical History:  She  has a past surgical history that includes Abdominal hysterectomy; Cholecystectomy; Appendectomy; Esophagogastroduodenoscopy (01/07/2012); Colonoscopy (01/09/2012); Video bronchoscopy (Bilateral, 05/14/2016); Esophagogastroduodenoscopy (egd) with propofol (N/A, 08/25/2017); and Colonoscopy with propofol (N/A, 08/25/2017). Family History:  Her family history includes Breast cancer in her mother; Kidney disease in her father; Melanoma in her mother; Stomach cancer in her mother; Throat cancer in her mother. Social  History:   reports that she has been smoking cigarettes. She has a 84.00 pack-year smoking history. She has never used smokeless tobacco. She reports current alcohol use of about 35.0 standard drinks of alcohol per week. She reports that she does not use drugs.  Prior to Admission medications   Medication Sig Start Date End Date Taking? Authorizing Provider  albuterol (VENTOLIN HFA) 108 (90 Base) MCG/ACT inhaler Inhale 2 puffs into the lungs every 6 (six) hours as needed for wheezing or shortness of breath. 06/26/19   [provider]  amoxicillin-clavulanate (AUGMENTIN) 875-125 MG tablet Take 1 tablet by mouth 2 (two) times daily. 11/14/22   Lurene Shadow, MD  Cholecalciferol (VITAMIN D-3) 5000 UNITS TABS Take 10,000 Units by mouth daily after breakfast. Patient not taking: Reported on 11/09/2022    [provider]  cyclobenzaprine (FLEXERIL) 10 MG tablet Take 10 mg by mouth 2 (two) times daily as needed. 09/27/22   [provider]  ferrous sulfate 325 (65 FE) MG tablet Take 1 tablet (325 mg total) by mouth every other day.  06/02/22 08/01/22  Kendell Bane, MD  HYDROcodone-acetaminophen (NORCO/VICODIN) 5-325 MG tablet Take 2 tablets by mouth 3 (three) times daily as needed. 10/06/22   [provider]  levothyroxine (SYNTHROID, LEVOTHROID) 50 MCG tablet Take 50 mcg by mouth daily before breakfast.    [provider]  lisinopril (ZESTRIL) 10 MG tablet Take 10 mg by mouth daily. 07/15/19   [provider]  naloxone [facility]) nasal spray 4 mg/0.1 mL Place 1 spray into the nose once. 09/27/22   [provider]  ondansetron (ZOFRAN-ODT) 8 MG disintegrating tablet Take 8 mg by mouth 2 (two) times daily as needed for nausea. 09/04/21   [provider]  pantoprazole (PROTONIX) 40 MG tablet Take 1 tablet (40 mg total) by mouth 2 (two) times daily for 10 days. 06/01/22 06/11/22  ShahmehdiGemma Payor, MD  rosuvastatin (CRESTOR) 5 MG tablet Take 1 tablet (5 mg total) by mouth every evening. For cholesterol. Patient not taking: Reported on 11/09/2022 09/17/19   Doreene Nest, NP  traZODone (DESYREL) 50 MG tablet Take 50-100 mg by mouth at bedtime. 10/17/22   [provider]    Current Facility-Administered Medications  Medication Dose Route Frequency Provider Last Rate Last Admin   0.9 %  sodium chloride infusion  1,000 mL Intravenous Continuous Linwood Dibbles, MD   Stopped at 12/03/22 1610   acetaminophen (TYLENOL) tablet 650 mg  650 mg Oral Q6H PRN Gery Pray, MD       Or   acetaminophen (TYLENOL) suppository 650 mg  650 mg Rectal Q6H PRN Crosley, Debby, MD       folic acid (FOLVITE) tablet 1 mg  1 mg Oral Daily Crosley, Debby, MD       lactated ringers infusion   Intravenous Continuous Gery Pray, MD 75 mL/hr at 12/03/22 0517 New Bag at 12/03/22 0517   LORazepam (ATIVAN) tablet 1-4 mg  1-4 mg Oral Q1H PRN Gery Pray, MD       Or   LORazepam (ATIVAN) injection 1-4 mg  1-4 mg Intravenous Q1H PRN Crosley, Debby, MD       multivitamin with minerals tablet 1 tablet  1  tablet Oral Daily Crosley, Debby, MD       ondansetron (ZOFRAN) injection 4 mg  4 mg Intravenous Q6H PRN Crosley, Debby, MD   4 mg at 12/03/22 0702   pantoprozole (PROTONIX) 80 mg /NS 100 mL infusion  8 mg/hr Intravenous Continuous Linwood Dibbles, MD 10 mL/hr at 12/03/22 0029 8 mg/hr at 12/03/22 0029   thiamine (VITAMIN B1) tablet 100 mg  100 mg Oral Daily Crosley, Debby, MD       Or   thiamine (VITAMIN B1) injection 100 mg  100 mg Intravenous Daily Crosley, Debby, MD   100 mg at 12/03/22 0981    Allergies as of 12/02/2022   (No Known Allergies)    Review of Systems:    Review of Systems  Constitutional:  Positive for malaise/fatigue and weight loss. Negative for chills and fever.  Respiratory:  Positive for cough and shortness of breath.   Cardiovascular:  Positive for chest pain. Negative for leg swelling.  Gastrointestinal:  Positive for abdominal pain, blood in stool, constipation, heartburn, melena, nausea and vomiting.  Genitourinary:  Positive for dysuria. Negative for hematuria.  Musculoskeletal:  Positive for back pain.  Neurological:  Positive for weakness. Negative for focal weakness.       Physical Exam:  Vital signs in last 24 hours: Temp:  [97.9 F (36.6 C)-98.1 F (36.7 C)] 98.1 F (36.7 C) (06/28 0729) Pulse Rate:  [106-127] 106 (06/28 0729) Resp:  [11-20] 16 (06/28 0729) BP: (98-130)/(60-94) 112/64 (06/28 0729) SpO2:  [98 %-100 %] 98 % (06/28 0729) Weight:  [43.2 kg] 43.2 kg (06/28 0014) Last BM Date : 12/02/22 Last BM recorded by nurses in past 5 days No data recorded  General:  Chronically ill appearing female in no acute distress Head:  Normocephalic and atraumatic. Poor dentition, dark macules on post pharnyx but no white plaques.  Eyes: sclerae anicteric,conjunctive pink  Heart:  regular rate and rhythm Pulm: Clear anteriorly; no wheezing Abdomen:  Soft, Flat AB, Sluggish bowel sounds. mild- mod tenderness in the lower abdomen. With guarding and Without  rebound, No organomegaly appreciated. Extremities:  Without edema. Rectal: posterior fissure noted, some brown stool on rectum, normal to decreased rectal tone, no internal hemorrhoids appreciated, no masses, large volume soft rectal ball/stool appreciated, patient declines disimpaction Msk:  Symmetrical without gross deformities. Peripheral pulses intact.  Neurologic:  Alert and  oriented x4;  No focal deficits.  Skin:   Dry and intact without significant lesions or rashes. Psychiatric:  Cooperative. Normal mood and affect.  LAB RESULTS: Recent Labs    12/02/22 2236 12/02/22 2243  WBC 9.7  --   HGB 8.2* 8.5*  HCT 27.7* 25.0*  PLT 415*  --    BMET Recent Labs    12/02/22 2236 12/02/22 2243 12/03/22 0237  NA 143 141 144  K 4.3 4.0 4.1  CL 104 108 112*  CO2 21*  --  19*  GLUCOSE 101* 94 88  BUN 31* 32* 29*  CREATININE 2.58* 2.50* 2.19*  CALCIUM 8.9  --  7.7*   LFT Recent Labs    12/02/22 2236  PROT 5.2*  ALBUMIN 2.1*  AST 22  ALT 14  ALKPHOS 101  BILITOT 0.6   PT/INR No results for input(s): "LABPROT", "INR" in the last 72 hours.  STUDIES: CT ABDOMEN PELVIS WO CONTRAST  Result Date: 12/03/2022 CLINICAL DATA:  Abdominal pain, acute, nonlocalized, nausea, vomiting EXAM: CT ABDOMEN AND PELVIS WITHOUT CONTRAST TECHNIQUE: Multidetector CT imaging of the abdomen and pelvis was performed following the standard protocol without IV contrast. RADIATION DOSE REDUCTION: This exam was performed according to the departmental dose-optimization program which includes automated exposure control, adjustment of the mA and/or kV according to patient size and/or use of iterative reconstruction technique. COMPARISON:  11/10/2022 FINDINGS: Lower chest: Previously noted bibasilar pulmonary consolidation has improved with mild residual atelectasis or infiltrate within the visualized right lung base. Cardiac size within normal limits. Hepatobiliary: No focal liver abnormality is seen. Status  post cholecystectomy. No biliary dilatation. Pancreas: Unremarkable. No pancreatic ductal dilatation or surrounding inflammatory changes. Spleen: Unremarkable Adrenals/Urinary Tract: Adrenal glands are unremarkable. Kidneys are normal, without renal calculi, focal lesion, or hydronephrosis. Bladder is unremarkable. Stomach/Bowel: Moderate descending sigmoid colonic diverticulosis without superimposed acute inflammatory change. Similar to prior examination, there is moderate fluid distension of the stomach with decompression of the duodenum which is nonspecific. While this can be seen in the setting of gastroparesis or gastric outlet obstruction, small-bowel follow-through examination performed 02/05/2022 demonstrates normal emptying of the stomach. The stomach, small bowel, and large bowel are otherwise unremarkable. No free intraperitoneal gas or fluid. Vascular/Lymphatic: Moderate aortoiliac atherosclerotic calcification. No aortic aneurysm. No pathologic adenopathy within the abdomen and pelvis. Reproductive: Status post hysterectomy. No adnexal masses. Other: No abdominal wall hernia Musculoskeletal: No acute bone abnormality. No lytic or blastic bone lesion. IMPRESSION: 1. No acute intra-abdominal pathology identified. 2. Moderate fluid distension of the stomach, nonspecific. Normal gastric emptying noted on prior small-bowel follow-through examination of 02/05/2022. 3. Moderate distal colonic diverticulosis without superimposed acute inflammatory change. 4. Interval improvement in bibasilar pulmonary consolidation with mild residual atelectasis or infiltrate within the visualized right lung base. Aortic Atherosclerosis (ICD10-I70.0). Electronically Signed   By: Helyn Numbers M.D.   On: 12/03/2022 00:24   DG Chest Portable 1 View  Result Date: 12/02/2022 CLINICAL DATA:  Weakness and abdominal pain. EXAM: PORTABLE CHEST 1 VIEW COMPARISON:  PA Lat 11/14/2022 FINDINGS: The heart size and mediastinal contours  are within normal limits. Calcific plaques in the aortic arch are again shown. Both lungs are clear. The visualized skeletal structures are unremarkable. Multiple overlying monitor wires especially on the left. IMPRESSION: No evidence of acute chest disease.  Aortic atherosclerosis. Electronically Signed   By: Almira Bar M.D.   On: 12/02/2022 22:55      Impression    Acute on chronic IDA anemia, FOBT positive Patient also FOBT positive in December of last year HGB 8.2, (06/01/2022 Hgb 9, 3 weeks ago 9.5, during hospitalization for sepsis went down to 7.7 and 7.4 on discharge 1 month.) B12 over 4000, folate 10, iron 22, Sat ratio 14, ferritin 42 2019 colonoscopy diverticulosis, patient reportedly had colonoscopy 04/2022 colonoscopy [facility] that was unremarkable unable to see report, also discussed on the phone with husband billy who confirmed this. Patient was on Protonix for reflux and this was stopped several months ago, patient denies NSAID use but does drink alcohol daily CT abdomen pelvis no intra-abdominal pathology but moderate fluid distended stomach nonspecific normal gastric emptying 02/05/2022 + fissure on exam  Dysphagia with odynophagia Recent Augmentin for pneumonia, no obvious plaques in posterior pharynx but possible candidal esophagitis. Some dysphagia with liquids as well Patient not had EGD since 2019, recent colonoscopy 04/2022 with [facility] though report not able to be seen Daily alcohol, no NSAIDs has not been on PPI  Opioid-induced constipation/CIC With rectal fissure, possible impaction/large volume stools in rectum Abdominal CT without diverticulitis or colitis CIC likely leading to patient's chronic abdominal pain  Alcohol abuse/polysubstance abuse/tobacco abuse Lipase unremarkable, negative ethanol Normal liver function CT abdomen pelvis without evidence of portal hypertension or cirrhosis  CKD stage III  COPD    Principal Problem:   GI bleed Active  Problems:   Hypothyroidism   Alcohol  abuse   Protein-calorie malnutrition, severe (HCC)   Essential hypertension   COPD (chronic obstructive pulmonary disease) (HCC)   Chronic abdominal pain   Stage 3 chronic kidney disease (HCC)   Chronic obstructive pulmonary disease (HCC)   Acquired hypothyroidism   Lesion of epiglottis   Chronic, continuous use of opioids   Amphetamine use   Iron deficiency anemia    LOS: 0 days     Plan   62 year old female with history of tobacco use, alcohol use, chronic opioid dependence with pain, IDA presents with abdominal pain, nausea vomiting diarrhea.  Longstanding history of similar symptoms with EGD and colonoscopy 2019 unrevealing other than tics, recent colonoscopy November 2023 unable to see report the patient reports unremarkable.  Complaining of some dysphagia/odynophagia on recent Augmentin for Pneumonia, patient FOBT positive which she has been since Dec.   It is reasonable at this time to proceed with upper endoscopy to evaluate for esophagitis, infectious esophagitis, gastritis, peptic ulcer disease with patient's history. Will start patient on Linzess for CIC/opioid-induced constipation Continue to monitor H&H but this has been chronic anemia for quite some time, transfuse above 7, consider IV iron. Continue Protonix, patient had infusion now on intravenous, uncertain if this is necessary but can continue until EGD. If EGD is unremarkable, could consider outpatient or inpatient capsule endoscopy. May also consider barium swallow or MBS if EGD negative is patient having some dysphagia with liquids. Alcohol cessation discussed Pending urine, patient had similar presentation on previous ER visit and patient is having dysuria, could be contributing to worsening abdominal pain. Patient is currently n.p.o., will allow sips of meds, plan for EGD today with Dr. Rhea Belton. Will schedule EGD. I discussed risks of EGD with patient today, including risk of  sedation, bleeding or perforation.  Patient provides understanding and gave verbal consent to proceed.   Thank you for your kind consultation, we will continue to follow.   Doree Albee  12/03/2022, 8:05 AM

## 2022-12-03 NOTE — H&P (View-Only) (Signed)
   Patient Name: [name] Date of Encounter: 12/03/2022, 4:07 PM   Was able to obtain endoscopic reports and pathology from [facility]. Review is below, will scan into patient's chart.   12/24/2021 average screening colonoscopy, overall prep was inadequate. Colon findings normal terminal ileum, ulcerations found at the ileocecal valve biopsies, mild diverticulosis, small internal hemorrhoids.  Recall 3 years due to inadequate prep. PATH: Ulcer and active colitis, no mention of acute versus chronic or Crypts was was to get CRP and fecal calprotectin, I do not have these results. 07/05/2022 EGD due to melena and coffee-ground emesis showed large nonbleeding clean-based ulcer gastric antrum, small nonbleeding clean-based ulcer at the pylorus, mild erosive gastritis in the gastric antrum, medium size nonbleeding and clean-based ulcer in duodenal bulb second part of the duodenum without abnormalities. PATH: Negative H. pylori gastritis She was suppose to return 2 months for follow-up EGD for ulcer healing, this was not done.   Patient  also suppose to be on Protonix once daily before meals and Carafate 3 times daily, patient states she has not been taking the PPI for several months.  Admitting CT abdomen pelvis without contrast showed moderate fluid distention of the stomach, moderate distal colonic diverticulosis without diverticulitis, no mention of colitis or inflammation.  PLAN: Patient was scheduled for endoscopic evaluation today however repeat hemoglobin was inadequate for sedation. Patient currently getting blood products and supportive care plan for EGD tomorrow to evaluate possible peptic ulcer disease that was seen in January. Patient had large volume stool possible fecal ball on rectal examination. Will start on Linzess today and MiraLAX twice daily,. If EGD is unremarkable for source of bleeding we will need to proceed with inpatient colonoscopy to evaluate for Crohn's  colitis, AVMs, source of bleeding.   If this is unremarkable may need to consider capsule endoscopy. 

## 2022-12-03 NOTE — Anesthesia Preprocedure Evaluation (Addendum)
Anesthesia Evaluation  Patient identified by MRN, date of birth, ID band Patient awake    Reviewed: Allergy & Precautions, NPO status , Patient's Chart, lab work & pertinent test results  Airway Mallampati: II  TM Distance: >3 FB Neck ROM: Full    Dental  (+) Dental Advisory Given, Partial Upper, Partial Lower   Pulmonary COPD,  COPD inhaler, Current Smoker and Patient abstained from smoking. 84 pack year history    Pulmonary exam normal        Cardiovascular hypertension, Pt. on medications Normal cardiovascular exam     Neuro/Psych negative neurological ROS  negative psych ROS   GI/Hepatic ,GERD  Controlled and Medicated,,(+)     substance abuse (35 drinks/wk)  alcohol useAnemia, FOBT+ Dysphagia    Endo/Other  Hypothyroidism    Renal/GU Renal Insufficiency and CRFRenal disease     Musculoskeletal  (+) Arthritis , Osteoarthritis,    Abdominal   Peds  Hematology  (+) Blood dyscrasia, anemia Hb 8.5, plt 415   Anesthesia Other Findings   Reproductive/Obstetrics                             Anesthesia Physical Anesthesia Plan  ASA: 3  Anesthesia Plan: MAC   Post-op Pain Management:    Induction:   PONV Risk Score and Plan: 1 and Propofol infusion and Treatment may vary due to age or medical condition  Airway Management Planned: Natural Airway and Simple Face Mask  Additional Equipment: None  Intra-op Plan:   Post-operative Plan:   Informed Consent: I have reviewed the patients History and Physical, chart, labs and discussed the procedure including the risks, benefits and alternatives for the proposed anesthesia with the patient or authorized representative who has indicated his/her understanding and acceptance.       Plan Discussed with: CRNA and Anesthesiologist  Anesthesia Plan Comments:        Anesthesia Quick Evaluation

## 2022-12-03 NOTE — Progress Notes (Signed)
Pt admitted to 6N. Pt is lethargic, reporting some abdominal pain and nausea. Skin assessment completed, noted with scattered abrasions and bruises. Pt on RA, ST on tele, Vital signs are as follows:   12/03/22 0436  Vitals  Temp 97.9 F (36.6 C)  Temp Source Oral  BP 110/65  MAP (mmHg) 78  BP Location Right Arm  BP Method Automatic  Patient Position (if appropriate) Lying  Pulse Rate (!) 109  Pulse Rate Source Monitor  ECG Heart Rate (!) 109  Resp 15  MEWS COLOR  MEWS Score Color Green  Oxygen Therapy  SpO2 99 %  O2 Device Room Air   Pt has outstanding orders for urine samples but pt denies urge to void. Bladder scan complete (260 cc's), MD notified.

## 2022-12-03 NOTE — Progress Notes (Signed)
   Patient Name: [name] Date of Encounter: 12/03/2022, 4:07 PM   Was able to obtain endoscopic reports and pathology from [facility] medical. Review is below, will scan into patient's chart.   12/24/2021 average screening colonoscopy, overall prep was inadequate. Colon findings normal terminal ileum, ulcerations found at the ileocecal valve biopsies, mild diverticulosis, small internal hemorrhoids.  Recall 3 years due to inadequate prep. PATH: Ulcer and active colitis, no mention of acute versus chronic or Crypts was was to get CRP and fecal calprotectin, I do not have these results. 07/05/2022 EGD due to melena and coffee-ground emesis showed large nonbleeding clean-based ulcer gastric antrum, small nonbleeding clean-based ulcer at the pylorus, mild erosive gastritis in the gastric antrum, medium size nonbleeding and clean-based ulcer in duodenal bulb second part of the duodenum without abnormalities. PATH: Negative H. pylori gastritis She was suppose to return 2 months for follow-up EGD for ulcer healing, this was not done.   Patient  also suppose to be on Protonix once daily before meals and Carafate 3 times daily, patient states she has not been taking the PPI for several months.  Admitting CT abdomen pelvis without contrast showed moderate fluid distention of the stomach, moderate distal colonic diverticulosis without diverticulitis, no mention of colitis or inflammation.  PLAN: Patient was scheduled for endoscopic evaluation today however repeat hemoglobin was inadequate for sedation. Patient currently getting blood products and supportive care plan for EGD tomorrow to evaluate possible peptic ulcer disease that was seen in January. Patient had large volume stool possible fecal ball on rectal examination. Will start on Linzess today and MiraLAX twice daily,. If EGD is unremarkable for source of bleeding we will need to proceed with inpatient colonoscopy to evaluate for Crohn's  colitis, AVMs, source of bleeding.   If this is unremarkable may need to consider capsule endoscopy.

## 2022-12-03 NOTE — Progress Notes (Signed)
Patient transferred from 6North to room 3 on 5 west. Patient is a/0 x4. [name]'s x4. Blood infusing at present. Blood transfusion started at 1330 prior to patient transfer. MP shows STACH. Patient has scabbed areas on both  lower extremities. Patient oriented to bed room and call bell. Patient and visitor agreed to keep patient's belongings at bedside. No meds with patient.

## 2022-12-04 ENCOUNTER — Observation Stay (HOSPITAL_COMMUNITY): Payer: Commercial Managed Care - HMO | Admitting: Anesthesiology

## 2022-12-04 ENCOUNTER — Encounter (HOSPITAL_COMMUNITY)
Admission: EM | Disposition: A | Discharge status: Home / Self Care | Payer: Self-pay | Source: Home / Self Care | Attending: Internal Medicine

## 2022-12-04 ENCOUNTER — Encounter (HOSPITAL_COMMUNITY): Payer: Self-pay | Admitting: Family Medicine

## 2022-12-04 DIAGNOSIS — F1010 Alcohol abuse, uncomplicated: Secondary | ICD-10-CM

## 2022-12-04 DIAGNOSIS — K922 Gastrointestinal hemorrhage, unspecified: Secondary | ICD-10-CM

## 2022-12-04 DIAGNOSIS — E039 Hypothyroidism, unspecified: Secondary | ICD-10-CM

## 2022-12-04 DIAGNOSIS — K253 Acute gastric ulcer without hemorrhage or perforation: Secondary | ICD-10-CM

## 2022-12-04 DIAGNOSIS — F17210 Nicotine dependence, cigarettes, uncomplicated: Secondary | ICD-10-CM

## 2022-12-04 DIAGNOSIS — K259 Gastric ulcer, unspecified as acute or chronic, without hemorrhage or perforation: Secondary | ICD-10-CM

## 2022-12-04 DIAGNOSIS — K274 Chronic or unspecified peptic ulcer, site unspecified, with hemorrhage: Secondary | ICD-10-CM

## 2022-12-04 DIAGNOSIS — K269 Duodenal ulcer, unspecified as acute or chronic, without hemorrhage or perforation: Secondary | ICD-10-CM | POA: Diagnosis not present

## 2022-12-04 DIAGNOSIS — N183 Chronic kidney disease, stage 3 unspecified: Secondary | ICD-10-CM

## 2022-12-04 DIAGNOSIS — I129 Hypertensive chronic kidney disease with stage 1 through stage 4 chronic kidney disease, or unspecified chronic kidney disease: Secondary | ICD-10-CM

## 2022-12-04 DIAGNOSIS — K315 Obstruction of duodenum: Secondary | ICD-10-CM

## 2022-12-04 DIAGNOSIS — D62 Acute posthemorrhagic anemia: Secondary | ICD-10-CM | POA: Diagnosis not present

## 2022-12-04 DIAGNOSIS — N1832 Chronic kidney disease, stage 3b: Secondary | ICD-10-CM

## 2022-12-04 HISTORY — PX: BIOPSY: SHX5522

## 2022-12-04 HISTORY — PX: ESOPHAGOGASTRODUODENOSCOPY (EGD) WITH PROPOFOL: SHX5813

## 2022-12-04 LAB — TYPE AND SCREEN
ABO/RH(D): O POS
Antibody Screen: NEGATIVE
Unit division: 0

## 2022-12-04 LAB — CBC
HCT: 32.3 % — ABNORMAL LOW (ref 36.0–46.0)
Hemoglobin: 10.4 g/dL — ABNORMAL LOW (ref 12.0–15.0)
MCH: 29.8 pg (ref 26.0–34.0)
MCHC: 32.2 g/dL (ref 30.0–36.0)
MCV: 92.6 fL (ref 80.0–100.0)
Platelets: 293 10*3/uL (ref 150–400)
RBC: 3.49 MIL/uL — ABNORMAL LOW (ref 3.87–5.11)
RDW: 22.1 % — ABNORMAL HIGH (ref 11.5–15.5)
WBC: 9.4 10*3/uL (ref 4.0–10.5)
nRBC: 0 % (ref 0.0–0.2)

## 2022-12-04 LAB — CBC WITH DIFFERENTIAL/PLATELET
Abs Immature Granulocytes: 0 10*3/uL (ref 0.00–0.07)
Basophils Absolute: 0.1 10*3/uL (ref 0.0–0.1)
Basophils Relative: 1 %
Eosinophils Absolute: 0.3 10*3/uL (ref 0.0–0.5)
Eosinophils Relative: 4 %
HCT: 31.8 % — ABNORMAL LOW (ref 36.0–46.0)
Hemoglobin: 9.8 g/dL — ABNORMAL LOW (ref 12.0–15.0)
Lymphocytes Relative: 10 %
Lymphs Abs: 0.7 10*3/uL (ref 0.7–4.0)
MCH: 28.3 pg (ref 26.0–34.0)
MCHC: 30.8 g/dL (ref 30.0–36.0)
MCV: 91.9 fL (ref 80.0–100.0)
Monocytes Absolute: 0.1 10*3/uL (ref 0.1–1.0)
Monocytes Relative: 2 %
Neutro Abs: 6.1 10*3/uL (ref 1.7–7.7)
Neutrophils Relative %: 83 %
Platelets: 270 10*3/uL (ref 150–400)
RBC: 3.46 MIL/uL — ABNORMAL LOW (ref 3.87–5.11)
RDW: 22.4 % — ABNORMAL HIGH (ref 11.5–15.5)
WBC: 7.3 10*3/uL (ref 4.0–10.5)
nRBC: 0 % (ref 0.0–0.2)
nRBC: 0 /100 WBC

## 2022-12-04 LAB — BASIC METABOLIC PANEL
Anion gap: 18 — ABNORMAL HIGH (ref 5–15)
BUN: 23 mg/dL — ABNORMAL HIGH (ref 6–20)
CO2: 15 mmol/L — ABNORMAL LOW (ref 22–32)
Calcium: 8.5 mg/dL — ABNORMAL LOW (ref 8.9–10.3)
Chloride: 112 mmol/L — ABNORMAL HIGH (ref 98–111)
Creatinine, Ser: 1.77 mg/dL — ABNORMAL HIGH (ref 0.44–1.00)
GFR, Estimated: 33 mL/min — ABNORMAL LOW (ref >60)
Glucose, Bld: 46 mg/dL — ABNORMAL LOW (ref 70–99)
Potassium: 4.6 mmol/L (ref 3.5–5.1)
Sodium: 145 mmol/L (ref 135–145)

## 2022-12-04 LAB — BPAM RBC
ISSUE DATE / TIME: 202406281659
Unit Type and Rh: 5100

## 2022-12-04 LAB — HEMOGLOBIN AND HEMATOCRIT, BLOOD
HCT: 31.8 % — ABNORMAL LOW (ref 36.0–46.0)
Hemoglobin: 9.8 g/dL — ABNORMAL LOW (ref 12.0–15.0)

## 2022-12-04 SURGERY — ESOPHAGOGASTRODUODENOSCOPY (EGD) WITH PROPOFOL
Anesthesia: Monitor Anesthesia Care

## 2022-12-04 MED ORDER — PANTOPRAZOLE SODIUM 40 MG PO TBEC
40.0000 mg | DELAYED_RELEASE_TABLET | Freq: Two times a day (BID) | ORAL | Status: DC
  Administered 2022-12-04 – 2022-12-05 (×3): 40 mg via ORAL
  Filled 2022-12-04 (×3): qty 1

## 2022-12-04 MED ORDER — SODIUM CHLORIDE 0.9 % IV SOLN: INTRAVENOUS | Status: DC

## 2022-12-04 MED ORDER — LIDOCAINE 2% (20 MG/ML) 5 ML SYRINGE
INTRAMUSCULAR | Status: DC | PRN
  Administered 2022-12-04: 40 mg via INTRAVENOUS

## 2022-12-04 MED ORDER — LACTATED RINGERS IV SOLN: INTRAVENOUS | Status: DC

## 2022-12-04 MED ORDER — FAMOTIDINE 20 MG PO TABS
40.0000 mg | ORAL_TABLET | Freq: Every day | ORAL | Status: DC
  Filled 2022-12-04: qty 1

## 2022-12-04 MED ORDER — FAMOTIDINE 20 MG PO TABS
20.0000 mg | ORAL_TABLET | Freq: Every day | ORAL | Status: DC
  Administered 2022-12-04: 20 mg via ORAL
  Filled 2022-12-04: qty 1

## 2022-12-04 MED ORDER — DEXMEDETOMIDINE HCL IN NACL 80 MCG/20ML IV SOLN
INTRAVENOUS | Status: DC | PRN
  Administered 2022-12-04: 8 ug via INTRAVENOUS
  Administered 2022-12-04: 12 ug via INTRAVENOUS

## 2022-12-04 MED ORDER — PROPOFOL 10 MG/ML IV BOLUS
INTRAVENOUS | Status: DC | PRN
  Administered 2022-12-04 (×2): 50 mg via INTRAVENOUS

## 2022-12-04 MED ORDER — PROPOFOL 500 MG/50ML IV EMUL
INTRAVENOUS | Status: DC | PRN
  Administered 2022-12-04: 100 ug/kg/min via INTRAVENOUS

## 2022-12-04 SURGICAL SUPPLY — 15 items

## 2022-12-04 NOTE — Plan of Care (Signed)

## 2022-12-04 NOTE — Anesthesia Procedure Notes (Addendum)
Procedure Name: MAC Date/Time: 12/04/2022 8:07 AM  Performed by: Zollie Beckers, CRNAPre-anesthesia Checklist: Patient identified, Emergency Drugs available, Suction available and Patient being monitored Patient Re-evaluated:Patient Re-evaluated prior to induction Oxygen Delivery Method: Nasal cannula Placement Confirmation: positive ETCO2

## 2022-12-04 NOTE — Op Note (Signed)
[facility] Patient Name: [name] Procedure Date : 12/04/2022 MRN: [id number] Attending MD: Beverley Fiedler , MD, [id number] Date of Birth: Oct 18, 1960 CSN: [id number] Age: 62 Admit Type: Inpatient Procedure:                Upper GI endoscopy Indications:              Acute post hemorrhagic anemia, Heme positive stool,                            Nausea with vomiting Providers:                Carie Caddy. Rhea Belton, MD, Carlena Hurl, Harrington Challenger,                            Technician Referring MD:             [facility] Medicines:                Monitored Anesthesia Care Complications:            No immediate complications. Estimated Blood Loss:     Estimated blood loss: none. Procedure:                Pre-Anesthesia Assessment:                           - Prior to the procedure, a History and Physical                            was performed, and patient medications and                            allergies were reviewed. The patient's tolerance of                            previous anesthesia was also reviewed. The risks                            and benefits of the procedure and the sedation                            options and risks were discussed with the patient.                            All questions were answered, and informed consent                            was obtained. Prior Anticoagulants: The patient has                            taken no anticoagulant or antiplatelet agents. ASA                            Grade Assessment: III - A patient with severe  systemic disease. After reviewing the risks and                            benefits, the patient was deemed in satisfactory                            condition to undergo the procedure.                           After obtaining informed consent, the endoscope was                            passed under direct vision. Throughout the                             procedure, the patient's blood pressure, pulse, and                            oxygen saturations were monitored continuously. The                            [id number]-H190 ([id number]) Olympus endoscope was introduced                            through the mouth, and advanced to the second part                            of duodenum. After obtaining informed consent, the                            endoscope was passed under direct vision.                            Throughout the procedure, the patient's blood                            pressure, pulse, and oxygen saturations were                            monitored continuously.The upper GI endoscopy was                            accomplished without difficulty. The patient                            tolerated the procedure well. Scope In: Scope Out: Findings:      The examined esophagus was normal.      The gastroesophageal flap valve was visualized endoscopically and       classified as Hill Grade II (fold present, opens with respiration).      One non-bleeding cratered gastric ulcer with no stigmata of bleeding was       found in the prepyloric region of the stomach. The lesion was 20 mm in       largest dimension. There is deformity at a patulous pylorus  from current       and likely previous ulcer disease. Biopsies were taken with a cold       forceps for histology and Helicobacter pylori testing.      One non-bleeding cratered duodenal ulcer with no stigmata of bleeding       was found in the duodenal bulb and the D1/D2 junction. The lesion was 5       mm in largest dimension.      An acquired benign-appearing, intrinsic moderate stenosis was found in       the duodenal sweep. There is edema and given the presence of ulceration       attempt to traverse the stenosis was not made today. Impression:               - Normal esophagus.                           - Non-bleeding, large, gastric ulcer with no                            stigmata  of bleeding. Patulous pylorus from ulcer                            disease. Biopsied.                           - Non-bleeding duodenal ulcer with no stigmata of                            bleeding with associated acquired duodenal stenosis                            at the duodenal sweep. This is not completely                            obstructing but stenosis present due to ulcer                            disease and acute ulcer related edema. Moderate Sedation:      N/A Recommendation:           - Return patient to hospital ward for ongoing care.                           - Advance diet as tolerated with goal of soft                            foods/low residue given ulcer-associated duodenal                            stenosis.                           - Continue present medications.                           - High dose PPI is recommended for at least 12  weeks (pantoprazole 40 mg BID-AC).                           - No aspirin, ibuprofen, naproxen, or other                            non-steroidal anti-inflammatory drugs.                           - Await pathology results to exclude H. Pylori. Procedure Code(s):        --- Professional ---                           [phone], Esophagogastroduodenoscopy, flexible,                            transoral; with biopsy, single or multiple Diagnosis Code(s):        --- Professional ---                           K25.9, Gastric ulcer, unspecified as acute or                            chronic, without hemorrhage or perforation                           K26.9, Duodenal ulcer, unspecified as acute or                            chronic, without hemorrhage or perforation                           K31.5, Obstruction of duodenum                           D62, Acute posthemorrhagic anemia                           R19.5, Other fecal abnormalities                           R11.2, Nausea with vomiting, unspecified CPT  copyright 2022 American Medical Association. All rights reserved. The codes documented in this report are preliminary and upon coder review may  be revised to meet current compliance requirements. Beverley Fiedler, MD 12/04/2022 8:47:26 AM This report has been signed electronically. Number of Addenda: 0

## 2022-12-04 NOTE — Anesthesia Postprocedure Evaluation (Signed)
Anesthesia Post Note  Patient: [name]  Procedure(s) Performed: ESOPHAGOGASTRODUODENOSCOPY (EGD) WITH PROPOFOL BIOPSY     Patient location during evaluation: PACU Anesthesia Type: MAC Level of consciousness: awake and alert Pain management: pain level controlled Vital Signs Assessment: post-procedure vital signs reviewed and stable Respiratory status: spontaneous breathing, nonlabored ventilation and respiratory function stable Cardiovascular status: stable and blood pressure returned to baseline Anesthetic complications: no   No notable events documented.  Last Vitals:  Vitals:   12/04/22 0836 12/04/22 0845  BP: 115/71 (!) 156/84  Pulse:  89  Resp: 18 18  Temp: 36.6 C   SpO2:  100%    Last Pain:  Vitals:   12/04/22 0836  TempSrc:   PainSc: 0-No pain                 Beryle Lathe

## 2022-12-04 NOTE — Interval H&P Note (Signed)
History and Physical Interval Note: For EGD this morning to evaluate melena and acute on chronic presumed blood loss anemia The nature of the procedure, as well as the risks, benefits, and alternatives were carefully and thoroughly reviewed with the patient. Ample time for discussion and questions allowed. The patient understood, was satisfied, and agreed to proceed.      Latest Ref Rng & Units 12/04/2022    6:00 AM 12/03/2022   10:40 PM 12/03/2022   11:11 AM  CBC  Hemoglobin 12.0 - 15.0 g/dL 9.8  9.8  5.7   Hematocrit 36.0 - 46.0 % 31.8  32.8  19.6      12/04/2022 8:07 AM  Sheppard Plumber  has presented today for surgery, with the diagnosis of Anemia, FOBT +, dysphagia.  The various methods of treatment have been discussed with the patient and family. After consideration of risks, benefits and other options for treatment, the patient has consented to  Procedure(s): ESOPHAGOGASTRODUODENOSCOPY (EGD) WITH PROPOFOL (N/A) as a surgical intervention.  The patient's history has been reviewed, patient examined, no change in status, stable for surgery.  I have reviewed the patient's chart and labs.  Questions were answered to the patient's satisfaction.     Carie Caddy Radha Coggins

## 2022-12-04 NOTE — Transfer of Care (Signed)
Immediate Anesthesia Transfer of Care Note  Patient: [name]  Procedure(s) Performed: ESOPHAGOGASTRODUODENOSCOPY (EGD) WITH PROPOFOL BIOPSY  Patient Location: PACU  Anesthesia Type:MAC  Level of Consciousness: awake and alert   Airway & Oxygen Therapy: Patient Spontanous Breathing and Patient connected to nasal cannula oxygen  Post-op Assessment: Report given to RN and Post -op Vital signs reviewed and stable  Post vital signs: Reviewed and stable  Last Vitals:  Vitals Value Taken Time  BP 115/71 12/04/22 0836  Temp    Pulse 88 12/04/22 0841  Resp 22 12/04/22 0841  SpO2 99 % 12/04/22 0841  Vitals shown include unvalidated device data.  Last Pain:  Vitals:   12/04/22 0746  TempSrc: Temporal  PainSc: 9          Complications: No notable events documented.

## 2022-12-04 NOTE — Progress Notes (Signed)
PROGRESS NOTE        PATIENT DETAILS Name: [name] Age: 62 y.o. Sex: female Date of Birth: April 25, 1961 Admit Date: 12/02/2022 Admitting Physician Gery Pray, MD WUJ:[id number], [facility]  Brief Summary: Patient is a 62 y.o.  female with history of chronic abdominal pain, COPD, HTN, hypothyroidism, alcohol use-presented with worsening abdominal pain along with lightheadedness/dizziness-found to have severe anemia and subsequently admitted to the hospitalist service.    Significant events: 6/4-6/9>> hospitalization for PNA 6/27>> admit to [facility]-abdominal pain-worsening anemia  Significant studies: 6/27>> CT abdomen/pelvis: No acute pathology-moderate fluid distention of the stomach.  Distal colonic diverticulosis. 6/27>> CXR: No PNA  Significant microbiology data: None  Procedures: None  Consults: GI  Subjective: Appears comfortable but continues to complain of abdominal pain.  Objective: Vitals: Blood pressure (!) 145/76, pulse 99, temperature 97.8 F (36.6 C), temperature source Temporal, resp. rate 19, height 4\' 10"  (1.473 m), weight 43.2 kg, SpO2 100 %.   Exam: Gen Exam:Alert awake-not in any distress HEENT:atraumatic, normocephalic Chest: B/L clear to auscultation anteriorly CVS:S1S2 regular Abdomen:soft non tender, non distended Extremities:no edema Neurology: Non focal Skin: no rash  Pertinent Labs/Radiology:    Latest Ref Rng & Units 12/04/2022    7:16 AM 12/04/2022    6:00 AM 12/03/2022   10:40 PM  CBC  WBC 4.0 - 10.5 K/uL 7.3     Hemoglobin 12.0 - 15.0 g/dL 9.8  9.8  9.8   Hematocrit 36.0 - 46.0 % 31.8  31.8  32.8   Platelets 150 - 400 K/uL 270       Lab Results  Component Value Date   NA 145 12/04/2022   K 4.6 12/04/2022   CL 112 (H) 12/04/2022   CO2 15 (L) 12/04/2022     Assessment/Plan: GI bleeding with acute blood loss anemia Unclear whether this is upper GI bleeding versus lower GI bleeding No  hematochezia/hematemesis/melena overnight Hb stable after 2 units of PRBC transfusion on 6/28 Continue PPI EGD planned for later today GI following-await further recommendations.  Chronic abdominal pain Unclear etiology CT abdomen without any acute findings Ongoing for years-currently abdominal pain is at baseline per patient. Endoscopic evaluation scheduled for today Await further recommendations from gastroenterology. Continue as needed narcotics  AKI on CKD stage IIIb AKI hemodynamically mediated-in the setting of GI bleeding Improving with supportive care. Follow electrolytes.  COPD Stable-not in exacerbation Bronchodilators  Hypothyroidism Synthroid  HTN BP stable without the use of any antihypertensives Resume lisinopril when able  Chronic left leg ulcer Wound care eval  EtOH abuse No evidence of withdrawal Last drink 6/27 Ativan per CIWA protocol  BMI: Estimated body mass index is 19.9 kg/m as calculated from the following:   Height as of this encounter: 4\' 10"  (1.473 m).   Weight as of this encounter: 43.2 kg.   Code status:   Code Status: Full Code   DVT Prophylaxis: SCDs Start: 12/03/22 0242   Family Communication: None at bedside  Disposition Plan: Status is: Inpatient Remains inpatient appropriate because: Severity of illness   Planned Discharge Destination:Home   Diet: Diet Order             Diet NPO time specified  Diet effective now                     Antimicrobial agents: Anti-infectives (From admission,  onward)    None        MEDICATIONS: Scheduled Meds:  [MAR Hold] folic acid  1 mg Oral Daily   [MAR Hold] lidocaine   Topical BID   [MAR Hold] linaclotide  290 mcg Oral QAC breakfast   [MAR Hold] multivitamin with minerals  1 tablet Oral Daily   [MAR Hold] polyethylene glycol  17 g Oral BID   [MAR Hold] sodium chloride flush  10-40 mL Intracatheter Q12H   [MAR Hold] thiamine  100 mg Oral Daily   Or   [MAR  Hold] thiamine  100 mg Intravenous Daily   Continuous Infusions:  sodium chloride     [MAR Hold] ferric gluconate (FERRLECIT) IVPB     lactated ringers 75 mL/hr at 12/04/22 0807   lactated ringers     pantoprazole 8 mg/hr (12/03/22 2251)   PRN Meds:.[MAR Hold] acetaminophen **OR** [MAR Hold] acetaminophen, [MAR Hold] LORazepam **OR** [MAR Hold] LORazepam, [MAR Hold]  morphine injection, [MAR Hold] ondansetron (ZOFRAN) IV, [MAR Hold] sodium chloride flush   I have personally reviewed following labs and imaging studies  LABORATORY DATA: CBC: Recent Labs  Lab 12/02/22 2236 12/02/22 2243 12/03/22 1111 12/03/22 2240 12/04/22 0600 12/04/22 0716  WBC 9.7  --   --   --   --  7.3  NEUTROABS 7.5  --   --   --   --  PENDING  HGB 8.2* 8.5* 5.7* 9.8* 9.8* 9.8*  HCT 27.7* 25.0* 19.6* 32.8* 31.8* 31.8*  MCV 107.4*  --   --   --   --  91.9  PLT 415*  --   --   --   --  270    Basic Metabolic Panel: Recent Labs  Lab 12/02/22 2236 12/02/22 2243 12/03/22 0237 12/03/22 1111 12/04/22 0716  NA 143 141 144  --  145  K 4.3 4.0 4.1  --  4.6  CL 104 108 112*  --  112*  CO2 21*  --  19*  --  15*  GLUCOSE 101* 94 88  --  46*  BUN 31* 32* 29*  --  23*  CREATININE 2.58* 2.50* 2.19*  --  1.77*  CALCIUM 8.9  --  7.7*  --  8.5*  MG  --   --  1.9  --   --   PHOS  --   --   --  2.8  --     GFR: Estimated Creatinine Clearance: 21.8 mL/min (A) (by C-G formula based on SCr of 1.77 mg/dL (H)).  Liver Function Tests: Recent Labs  Lab 12/02/22 2236  AST 22  ALT 14  ALKPHOS 101  BILITOT 0.6  PROT 5.2*  ALBUMIN 2.1*   Recent Labs  Lab 12/02/22 2236  LIPASE 29   Recent Labs  Lab 12/03/22 0340  AMMONIA 25    Coagulation Profile: Recent Labs  Lab 12/03/22 1111  INR 1.1    Cardiac Enzymes: No results for input(s): "CKTOTAL", "CKMB", "CKMBINDEX", "TROPONINI" in the last 168 hours.  BNP (last 3 results) No results for input(s): "PROBNP" in the last 8760 hours.  Lipid  Profile: No results for input(s): "CHOL", "HDL", "LDLCALC", "TRIG", "CHOLHDL", "LDLDIRECT" in the last 72 hours.  Thyroid Function Tests: No results for input(s): "TSH", "T4TOTAL", "FREET4", "T3FREE", "THYROIDAB" in the last 72 hours.  Anemia Panel: Recent Labs    12/03/22 0237  VITAMINB12 4,349*  FOLATE 10.1  FERRITIN 42  TIBC 153*  IRON 22*  RETICCTPCT 4.3*    Urine  analysis:    Component Value Date/Time   COLORURINE YELLOW 12/03/2022 0825   APPEARANCEUR CLEAR 12/03/2022 0825   APPEARANCEUR Cloudy 03/28/2013 1640   LABSPEC 1.012 12/03/2022 0825   LABSPEC 1.021 03/28/2013 1640   PHURINE 7.0 12/03/2022 0825   GLUCOSEU NEGATIVE 12/03/2022 0825   GLUCOSEU Negative 03/28/2013 1640   HGBUR NEGATIVE 12/03/2022 0825   BILIRUBINUR NEGATIVE 12/03/2022 0825   BILIRUBINUR Negative 03/28/2013 1640   KETONESUR NEGATIVE 12/03/2022 0825   PROTEINUR NEGATIVE 12/03/2022 0825   UROBILINOGEN 0.2 12/18/2014 1704   NITRITE NEGATIVE 12/03/2022 0825   LEUKOCYTESUR NEGATIVE 12/03/2022 0825   LEUKOCYTESUR Trace 03/28/2013 1640    Sepsis Labs: Lactic Acid, Venous    Component Value Date/Time   LATICACIDVEN 1.7 12/03/2022 0045    MICROBIOLOGY: No results found for this or any previous visit (from the past 240 hour(s)).  RADIOLOGY STUDIES/RESULTS: CT ABDOMEN PELVIS WO CONTRAST  Result Date: 12/03/2022 CLINICAL DATA:  Abdominal pain, acute, nonlocalized, nausea, vomiting EXAM: CT ABDOMEN AND PELVIS WITHOUT CONTRAST TECHNIQUE: Multidetector CT imaging of the abdomen and pelvis was performed following the standard protocol without IV contrast. RADIATION DOSE REDUCTION: This exam was performed according to the departmental dose-optimization program which includes automated exposure control, adjustment of the mA and/or kV according to patient size and/or use of iterative reconstruction technique. COMPARISON:  11/10/2022 FINDINGS: Lower chest: Previously noted bibasilar pulmonary consolidation  has improved with mild residual atelectasis or infiltrate within the visualized right lung base. Cardiac size within normal limits. Hepatobiliary: No focal liver abnormality is seen. Status post cholecystectomy. No biliary dilatation. Pancreas: Unremarkable. No pancreatic ductal dilatation or surrounding inflammatory changes. Spleen: Unremarkable Adrenals/Urinary Tract: Adrenal glands are unremarkable. Kidneys are normal, without renal calculi, focal lesion, or hydronephrosis. Bladder is unremarkable. Stomach/Bowel: Moderate descending sigmoid colonic diverticulosis without superimposed acute inflammatory change. Similar to prior examination, there is moderate fluid distension of the stomach with decompression of the duodenum which is nonspecific. While this can be seen in the setting of gastroparesis or gastric outlet obstruction, small-bowel follow-through examination performed 02/05/2022 demonstrates normal emptying of the stomach. The stomach, small bowel, and large bowel are otherwise unremarkable. No free intraperitoneal gas or fluid. Vascular/Lymphatic: Moderate aortoiliac atherosclerotic calcification. No aortic aneurysm. No pathologic adenopathy within the abdomen and pelvis. Reproductive: Status post hysterectomy. No adnexal masses. Other: No abdominal wall hernia Musculoskeletal: No acute bone abnormality. No lytic or blastic bone lesion. IMPRESSION: 1. No acute intra-abdominal pathology identified. 2. Moderate fluid distension of the stomach, nonspecific. Normal gastric emptying noted on prior small-bowel follow-through examination of 02/05/2022. 3. Moderate distal colonic diverticulosis without superimposed acute inflammatory change. 4. Interval improvement in bibasilar pulmonary consolidation with mild residual atelectasis or infiltrate within the visualized right lung base. Aortic Atherosclerosis (ICD10-I70.0). Electronically Signed   By: Helyn Numbers M.D.   On: 12/03/2022 00:24   DG Chest  Portable 1 View  Result Date: 12/02/2022 CLINICAL DATA:  Weakness and abdominal pain. EXAM: PORTABLE CHEST 1 VIEW COMPARISON:  PA Lat 11/14/2022 FINDINGS: The heart size and mediastinal contours are within normal limits. Calcific plaques in the aortic arch are again shown. Both lungs are clear. The visualized skeletal structures are unremarkable. Multiple overlying monitor wires especially on the left. IMPRESSION: No evidence of acute chest disease.  Aortic atherosclerosis. Electronically Signed   By: Almira Bar M.D.   On: 12/02/2022 22:55     LOS: 1 day   Jeoffrey Massed, MD  [facility]    To contact the attending provider between  7A-7P or the covering provider during after hours 7P-7A, please log into the web site [email or web address] and access using universal [place] password for that web site. If you do not have the password, please call the hospital operator.  12/04/2022, 8:12 AM

## 2022-12-05 DIAGNOSIS — K922 Gastrointestinal hemorrhage, unspecified: Secondary | ICD-10-CM | POA: Diagnosis not present

## 2022-12-05 LAB — COMPREHENSIVE METABOLIC PANEL
ALT: 14 U/L (ref 0–44)
AST: 17 U/L (ref 15–41)
Albumin: 1.9 g/dL — ABNORMAL LOW (ref 3.5–5.0)
Alkaline Phosphatase: 71 U/L (ref 38–126)
Anion gap: 8 (ref 5–15)
BUN: 14 mg/dL (ref 6–20)
CO2: 19 mmol/L — ABNORMAL LOW (ref 22–32)
Calcium: 8.2 mg/dL — ABNORMAL LOW (ref 8.9–10.3)
Chloride: 114 mmol/L — ABNORMAL HIGH (ref 98–111)
Creatinine, Ser: 1.5 mg/dL — ABNORMAL HIGH (ref 0.44–1.00)
GFR, Estimated: 40 mL/min — ABNORMAL LOW (ref >60)
Glucose, Bld: 76 mg/dL (ref 70–99)
Potassium: 3.8 mmol/L (ref 3.5–5.1)
Sodium: 141 mmol/L (ref 135–145)
Total Bilirubin: 0.7 mg/dL (ref 0.3–1.2)
Total Protein: 4.3 g/dL — ABNORMAL LOW (ref 6.5–8.1)

## 2022-12-05 LAB — CBC
HCT: 28.9 % — ABNORMAL LOW (ref 36.0–46.0)
Hemoglobin: 9.3 g/dL — ABNORMAL LOW (ref 12.0–15.0)
MCH: 29.7 pg (ref 26.0–34.0)
MCHC: 32.2 g/dL (ref 30.0–36.0)
MCV: 92.3 fL (ref 80.0–100.0)
Platelets: 257 10*3/uL (ref 150–400)
RBC: 3.13 MIL/uL — ABNORMAL LOW (ref 3.87–5.11)
RDW: 22 % — ABNORMAL HIGH (ref 11.5–15.5)
WBC: 7.1 10*3/uL (ref 4.0–10.5)
nRBC: 0 % (ref 0.0–0.2)

## 2022-12-05 MED ORDER — PANTOPRAZOLE SODIUM 40 MG PO TBEC: 40.0000 mg | DELAYED_RELEASE_TABLET | Freq: Two times a day (BID) | ORAL | 3 refills | Status: DC

## 2022-12-05 MED ORDER — FOLIC ACID 1 MG PO TABS: 1.0000 mg | ORAL_TABLET | Freq: Every day | ORAL | 0 refills | Status: DC

## 2022-12-05 MED ORDER — AMLODIPINE BESYLATE 10 MG PO TABS
10.0000 mg | ORAL_TABLET | Freq: Every day | ORAL | Status: DC
  Administered 2022-12-05: 10 mg via ORAL
  Filled 2022-12-05: qty 1

## 2022-12-05 MED ORDER — AMLODIPINE BESYLATE 10 MG PO TABS: 10.0000 mg | ORAL_TABLET | Freq: Every day | ORAL | 0 refills | Status: DC

## 2022-12-05 MED ORDER — FERROUS SULFATE 325 (65 FE) MG PO TABS: 325.0000 mg | ORAL_TABLET | Freq: Two times a day (BID) | ORAL | 0 refills | Status: AC

## 2022-12-05 NOTE — Social Work (Signed)
CSW received message from nurse stating family wanted to speak to Child psychotherapist. CSW called pts room and pt gave the phone to her sister Crystal. Crystal requested help with signing pt up for medicaid. CSW informed Crystal that the hospital does not assist with applying to [facility] but she can call [place] DSS at [phone] and request to speak to a eligibility caseworker for [employer] [employer].   Jimmy Picket, LCSW Clinical Social Worker

## 2022-12-05 NOTE — Discharge Instructions (Addendum)
Stop taking any over-the-counter ibuprofen, Aleve, naproxen, Soma powder, Goody powder etc.  No NSAID family of medications.    Follow with Primary MD [facility] in 7-10 days   Get CBC, CMP,Anemia panel-  checked next visit with your primary MD   Activity: As tolerated with Full fall precautions use walker/cane & assistance as needed  Disposition Home   Diet: Heart Healthy    Special Instructions: If you have smoked or chewed Tobacco  in the last 2 yrs please stop smoking, stop any regular Alcohol  and or any Recreational drug use.  On your next visit with your primary care physician please Get Medicines reviewed and adjusted.  Please request your Prim.MD to go over all Hospital Tests and Procedure/Radiological results at the follow up, please get all Hospital records sent to your Prim MD by signing hospital release before you go home.  If you experience worsening of your admission symptoms, develop shortness of breath, life threatening emergency, suicidal or homicidal thoughts you must seek medical attention immediately by calling 911 or calling your MD immediately  if symptoms less severe.  You Must read complete instructions/literature along with all the possible adverse reactions/side effects for all the Medicines you take and that have been prescribed to you. Take any new Medicines after you have completely understood and accpet all the possible adverse reactions/side effects.

## 2022-12-05 NOTE — Discharge Summary (Signed)
[name] BMW:[id number] DOB: Dec 04, 1960 DOA: 12/02/2022  PCP: [facility]  Admit date: 12/02/2022  Discharge date: 12/05/2022  Admitted From: Home   Disposition:  Home   Recommendations for Outpatient Follow-up:   Follow up with PCP in 1-2 weeks  PCP Please obtain BMP/CBC, 2 view CXR in 1week,  (see Discharge instructions)   PCP Please follow up on the following pending results: CBC, BMP, anemia panel in 7 to 10 days, follow-up with the recommended gastroenterologist within 2 to 3 weeks.   Home Health: None   Equipment/Devices: None  Consultations: GI Discharge Condition: Stable    CODE STATUS: Full    Diet Recommendation: Heart Healthy     Chief Complaint  Patient presents with   Abdominal Pain   Vomiting     Brief history of present illness from the day of admission and additional interim summary    62 y.o.  female with history of chronic abdominal pain, COPD, HTN, hypothyroidism, alcohol use-presented with worsening abdominal pain along with lightheadedness/dizziness-found to have severe anemia and subsequently admitted to the hospitalist service.     Significant events: 6/4-6/9>> hospitalization for PNA 6/27>> admit to [facility]-abdominal pain-worsening anemia   Significant studies: 6/27>> CT abdomen/pelvis: No acute pathology-moderate fluid distention of the stomach.  Distal colonic diverticulosis. 6/27>> CXR: No PNA                                                                 Hospital Course   GI bleeding with acute blood loss anemia Unclear whether this is upper GI bleeding versus lower GI bleeding No hematochezia/hematemesis/melena overnight Hb stable after 2 units of PRBC transfusion on 6/28 Continue PPI EGD done by [facility] 12/04/2022 showing nonbleeding gastric and  duodenal ulcer likely NSAID induced, placed on twice daily PPI phone in 20 days, close outpatient follow-up with PCP and GI, strictly counseled to abstain from NSAIDs, stable now no further signs of bleed will be discharged home with oral iron, folic acid, PPI, outpatient follow-up with GI and PCP in 7 to 10 days.   Chronic abdominal pain No acute issues continue to follow-up with GI outpatient   AKI on CKD stage IIIb, mild metabolic acidosis due to combination of AKI and hyperchloremia AKI hemodynamically mediated-in the setting of GI bleeding Much improved and trend is stable, continue to hold ACE inhibitor upon discharge, PCP to recheck BMP in 7 to 10 days.   COPD Stable-not in exacerbation Bronchodilators   Hypothyroidism Synthroid   HTN BP stable replacing lisinopril with Norvasc due to AKI, PCP to monitor.   Chronic left leg ulcer Wound care eval   EtOH abuse No evidence of withdrawal, counseled to quit      Discharge diagnosis     Principal Problem:   GI bleed Active  Problems:   Essential hypertension   Chronic, continuous use of opioids   Amphetamine use   Hypothyroidism   Alcohol abuse   Protein-calorie malnutrition, severe (HCC)   COPD (chronic obstructive pulmonary disease) (HCC)   Chronic abdominal pain   Stage 3 chronic kidney disease (HCC)   Chronic obstructive pulmonary disease (HCC)   Acquired hypothyroidism   Lesion of epiglottis   Iron deficiency anemia   GI bleeding   Heme positive stool   Acute gastric ulcer without hemorrhage or perforation   Duodenal ulcer   Duodenal stenosis    Discharge instructions    Discharge Instructions     Diet - low sodium heart healthy   Complete by: As directed    Discharge instructions   Complete by: As directed    Stop taking any over-the-counter ibuprofen, Aleve, naproxen, Soma powder, Goody powder etc.  No NSAID family of medications.    Follow with [facility] in 7-10 days    Get CBC, CMP,Anemia panel-  checked next visit with your primary MD   Activity: As tolerated with Full fall precautions use walker/cane & assistance as needed  Disposition Home   Diet: Heart Healthy    Special Instructions: If you have smoked or chewed Tobacco  in the last 2 yrs please stop smoking, stop any regular Alcohol  and or any Recreational drug use.  On your next visit with your primary care physician please Get Medicines reviewed and adjusted.  Please request your Prim.MD to go over all Hospital Tests and Procedure/Radiological results at the follow up, please get all Hospital records sent to your Prim MD by signing hospital release before you go home.  If you experience worsening of your admission symptoms, develop shortness of breath, life threatening emergency, suicidal or homicidal thoughts you must seek medical attention immediately by calling 911 or calling your MD immediately  if symptoms less severe.  You Must read complete instructions/literature along with all the possible adverse reactions/side effects for all the Medicines you take and that have been prescribed to you. Take any new Medicines after you have completely understood and accpet all the possible adverse reactions/side effects.   Increase activity slowly   Complete by: As directed        Discharge Medications   Allergies as of 12/05/2022   No Known Allergies      Medication List     STOP taking these medications    amoxicillin-clavulanate 875-125 MG tablet Commonly known as: AUGMENTIN   lisinopril 10 MG tablet Commonly known as: ZESTRIL   Vitamin D-3 125 MCG (5000 UT) Tabs       TAKE these medications    albuterol 108 (90 Base) MCG/ACT inhaler Commonly known as: VENTOLIN HFA Inhale 2 puffs into the lungs every 6 (six) hours as needed for wheezing or shortness of breath.   amLODipine 10 MG tablet Commonly known as: NORVASC Take 1 tablet (10 mg total) by mouth daily.    cyclobenzaprine 10 MG tablet Commonly known as: FLEXERIL Take 10 mg by mouth 2 (two) times daily as needed.   ferrous sulfate 325 (65 FE) MG tablet Take 1 tablet (325 mg total) by mouth 2 (two) times daily with a meal. What changed: when to take this   folic acid 1 MG tablet Commonly known as: FOLVITE Take 1 tablet (1 mg total) by mouth daily.   HYDROcodone-acetaminophen 5-325 MG tablet Commonly known as: NORCO/VICODIN Take 2 tablets by mouth 3 (three) times daily  as needed.   levothyroxine 50 MCG tablet Commonly known as: SYNTHROID Take 50 mcg by mouth daily before breakfast.   naloxone 4 MG/0.1ML Liqd nasal spray kit Commonly known as: NARCAN Place 1 spray into the nose once.   ondansetron 8 MG disintegrating tablet Commonly known as: ZOFRAN-ODT Take 8 mg by mouth 2 (two) times daily as needed for nausea.   pantoprazole 40 MG tablet Commonly known as: Protonix Take 1 tablet (40 mg total) by mouth 2 (two) times daily.   rosuvastatin 5 MG tablet Commonly known as: Crestor Take 1 tablet (5 mg total) by mouth every evening. For cholesterol.   traZODone 50 MG tablet Commonly known as: DESYREL Take 50-100 mg by mouth at bedtime.         Follow-up Information     [facility] Schedule an appointment as soon as possible for a visit in 1 week(s).   Contact information: [address] [place] [address] [phone]         Beverley Fiedler, MD. Schedule an appointment as soon as possible for a visit in 1 week(s).   Specialty: Gastroenterology Contact information: [address] [place] [address] [phone]                 Major procedures and Radiology Reports - PLEASE review detailed and final reports thoroughly  -      CT ABDOMEN PELVIS WO CONTRAST  Result Date: 12/03/2022 CLINICAL DATA:  Abdominal pain, acute, nonlocalized, nausea, vomiting EXAM: CT ABDOMEN AND PELVIS WITHOUT CONTRAST TECHNIQUE: Multidetector CT imaging of  the abdomen and pelvis was performed following the standard protocol without IV contrast. RADIATION DOSE REDUCTION: This exam was performed according to the departmental dose-optimization program which includes automated exposure control, adjustment of the mA and/or kV according to patient size and/or use of iterative reconstruction technique. COMPARISON:  11/10/2022 FINDINGS: Lower chest: Previously noted bibasilar pulmonary consolidation has improved with mild residual atelectasis or infiltrate within the visualized right lung base. Cardiac size within normal limits. Hepatobiliary: No focal liver abnormality is seen. Status post cholecystectomy. No biliary dilatation. Pancreas: Unremarkable. No pancreatic ductal dilatation or surrounding inflammatory changes. Spleen: Unremarkable Adrenals/Urinary Tract: Adrenal glands are unremarkable. Kidneys are normal, without renal calculi, focal lesion, or hydronephrosis. Bladder is unremarkable. Stomach/Bowel: Moderate descending sigmoid colonic diverticulosis without superimposed acute inflammatory change. Similar to prior examination, there is moderate fluid distension of the stomach with decompression of the duodenum which is nonspecific. While this can be seen in the setting of gastroparesis or gastric outlet obstruction, small-bowel follow-through examination performed 02/05/2022 demonstrates normal emptying of the stomach. The stomach, small bowel, and large bowel are otherwise unremarkable. No free intraperitoneal gas or fluid. Vascular/Lymphatic: Moderate aortoiliac atherosclerotic calcification. No aortic aneurysm. No pathologic adenopathy within the abdomen and pelvis. Reproductive: Status post hysterectomy. No adnexal masses. Other: No abdominal wall hernia Musculoskeletal: No acute bone abnormality. No lytic or blastic bone lesion. IMPRESSION: 1. No acute intra-abdominal pathology identified. 2. Moderate fluid distension of the stomach, nonspecific. Normal  gastric emptying noted on prior small-bowel follow-through examination of 02/05/2022. 3. Moderate distal colonic diverticulosis without superimposed acute inflammatory change. 4. Interval improvement in bibasilar pulmonary consolidation with mild residual atelectasis or infiltrate within the visualized right lung base. Aortic Atherosclerosis (ICD10-I70.0). Electronically Signed   By: Helyn Numbers M.D.   On: 12/03/2022 00:24   DG Chest Portable 1 View  Result Date: 12/02/2022 CLINICAL DATA:  Weakness and abdominal pain. EXAM: PORTABLE CHEST 1 VIEW  COMPARISON:  PA Lat 11/14/2022 FINDINGS: The heart size and mediastinal contours are within normal limits. Calcific plaques in the aortic arch are again shown. Both lungs are clear. The visualized skeletal structures are unremarkable. Multiple overlying monitor wires especially on the left. IMPRESSION: No evidence of acute chest disease.  Aortic atherosclerosis. Electronically Signed   By: Almira Bar M.D.   On: 12/02/2022 22:55   DG Chest 2 View  Result Date: 11/14/2022 CLINICAL DATA:  Pneumonia. EXAM: CHEST - 2 VIEW COMPARISON:  11/09/2022 FINDINGS: Lungs hyperexpanded. Similar appearance of the patchy basilar predominant bilateral airspace opacities. Tiny right pleural effusion is more prominent than on the previous chest x-ray but was visible on chest CT from 11/10/2022. The cardiopericardial silhouette is within normal limits for size. No acute bony abnormality. Telemetry leads overlie the chest. IMPRESSION: 1. Similar appearance of patchy basilar predominant bilateral airspace disease. 2. Tiny right pleural effusion. Electronically Signed   By: Kennith Center M.D.   On: 11/14/2022 13:36   CT ABDOMEN PELVIS WO CONTRAST  Result Date: 11/10/2022 CLINICAL DATA:  Pain lower abdomen EXAM: CT ABDOMEN AND PELVIS WITHOUT CONTRAST TECHNIQUE: Multidetector CT imaging of the abdomen and pelvis was performed following the standard protocol without IV contrast.  RADIATION DOSE REDUCTION: This exam was performed according to the departmental dose-optimization program which includes automated exposure control, adjustment of the mA and/or kV according to patient size and/or use of iterative reconstruction technique. COMPARISON:  05/30/2022 FINDINGS: Lower chest: New small patchy infiltrates are seen in right middle lobe, lingula and both lower lobes. Small bilateral pleural effusions are seen, more so on the right side. Coronary artery calcifications are seen. Hepatobiliary: There is small focus of low-density in the liver close to the falciform ligament with no change, possibly aberrant venous drainage or focal fatty infiltration. Surgical clips are seen in gallbladder fossa. There is no dilation of bile ducts. Pancreas: No focal abnormalities are seen. Spleen: Unremarkable. Adrenals/Urinary Tract: Adrenals are unremarkable. There is no hydronephrosis. There are foci of cortical thinning in the kidneys, more so on the left side. There are no renal or ureteral stones. There is 1.6 cm fluid density lesion in the medial midportion of right kidney suggesting renal cyst. Urinary bladder is unremarkable. Stomach/Bowel: Stomach is moderately distended with fluid There is no wall thickening in stomach. Small bowel loops are not dilated. Small bowel loops are mostly in right side of abdomen suggesting malrotation. There is no small bowel dilation. Appendix is not seen. Scattered diverticula are seen in colon. There is no evidence of focal acute diverticulitis. Vascular/Lymphatic: Arterial calcifications are seen. Reproductive: Uterus is not seen. There is questionable trace amount of free fluid in pelvis. Other: There is no pneumoperitoneum. There is mild edema in subcutaneous plane without any loculated fluid collections. Musculoskeletal: Degenerative changes are noted in lumbar spine, more so at L3-L4 level. IMPRESSION: There is no evidence of intestinal obstruction or  pneumoperitoneum. There is no hydronephrosis. New patchy alveolar infiltrates are seen in both lower lung fields suggesting possible multifocal pneumonia. Small bilateral pleural effusions. Scattered diverticula are seen in colon without signs of focal acute diverticulitis. Aortic arteriosclerosis. Scattered coronary artery calcifications are seen. Right renal cyst. Lumbar spondylosis. Questionable trace amount of free fluid is seen in pelvis. This may be an artifact or suggest minimal ascites due to nonspecific enteritis. There is mild subcutaneous edema without any loculated fluid collections. Electronically Signed   By: Ernie Avena M.D.   On: 11/10/2022 14:22  DG Chest Port 1 View  Result Date: 11/09/2022 CLINICAL DATA:  Cough x3 weeks EXAM: PORTABLE CHEST - 1 VIEW COMPARISON:  10/10/2021 FINDINGS: New patchy airspace opacities in the lung bases right greater than left, and laterally in the right upper lung. Heart size and mediastinal contours are within normal limits. No effusion. Visualized bones unremarkable. IMPRESSION: New patchy bilateral airspace disease. Electronically Signed   By: Corlis Leak M.D.   On: 11/09/2022 13:44    Micro Results    No results found for this or any previous visit (from the past 240 hour(s)).  Today   Subjective    Ramon Dredge today has no headache,no chest abdominal pain,no new weakness tingling or numbness, feels much better wants to go home today.    Objective   Blood pressure (!) 151/58, pulse 72, temperature 97.8 F (36.6 C), temperature source Oral, resp. rate 16, height 4\' 10"  (1.473 m), weight 43.2 kg, SpO2 100 %.  No intake or output data in the 24 hours ending 12/05/22 0748  Exam  Awake Alert, No new F.N deficits,    [place].AT,PERRAL Supple Neck,   Symmetrical Chest wall movement, Good air movement bilaterally, CTAB RRR,No Gallops,   +ve B.Sounds, Abd Soft, Non tender,  No Cyanosis, Clubbing or edema    Data Review   Recent Labs   Lab 12/02/22 2236 12/02/22 2243 12/03/22 1111 12/03/22 2240 12/04/22 0600 12/04/22 0716 12/04/22 1744  WBC 9.7  --   --   --   --  7.3 9.4  HGB 8.2*   < > 5.7* 9.8* 9.8* 9.8* 10.4*  HCT 27.7*   < > 19.6* 32.8* 31.8* 31.8* 32.3*  PLT 415*  --   --   --   --  270 293  MCV 107.4*  --   --   --   --  91.9 92.6  MCH 31.8  --   --   --   --  28.3 29.8  MCHC 29.6*  --   --   --   --  30.8 32.2  RDW 18.6*  --   --   --   --  22.4* 22.1*  LYMPHSABS 1.4  --   --   --   --  0.7  --   MONOABS 0.6  --   --   --   --  0.1  --   EOSABS 0.0  --   --   --   --  0.3  --   BASOSABS 0.1  --   --   --   --  0.1  --    < > = values in this interval not displayed.    Recent Labs  Lab 12/02/22 2236 12/02/22 2243 12/03/22 0045 12/03/22 0237 12/03/22 0340 12/03/22 1111 12/04/22 0716  NA 143 141  --  144  --   --  145  K 4.3 4.0  --  4.1  --   --  4.6  CL 104 108  --  112*  --   --  112*  CO2 21*  --   --  19*  --   --  15*  ANIONGAP 18*  --   --  13  --   --  18*  GLUCOSE 101* 94  --  88  --   --  46*  BUN 31* 32*  --  29*  --   --  23*  CREATININE 2.58* 2.50*  --  2.19*  --   --  1.77*  AST 22  --   --   --   --   --   --   ALT 14  --   --   --   --   --   --   ALKPHOS 101  --   --   --   --   --   --   BILITOT 0.6  --   --   --   --   --   --   ALBUMIN 2.1*  --   --   --   --   --   --   LATICACIDVEN 1.5  --  1.7  --   --   --   --   INR  --   --   --   --   --  1.1  --   AMMONIA  --   --   --   --  25  --   --   MG  --   --   --  1.9  --   --   --   CALCIUM 8.9  --   --  7.7*  --   --  8.5*    Total Time in preparing paper work, data evaluation and todays exam - 35 minutes  Signature  -    Susa Raring M.D on 12/05/2022 at 7:48 AM   -  To page go to [email or web address]

## 2022-12-06 ENCOUNTER — Encounter (HOSPITAL_COMMUNITY): Payer: Self-pay | Admitting: Internal Medicine

## 2022-12-07 ENCOUNTER — Encounter: Payer: Self-pay | Admitting: Internal Medicine

## 2022-12-07 LAB — SURGICAL PATHOLOGY

## 2023-01-06 ENCOUNTER — Encounter: Payer: Self-pay | Admitting: Urgent Care

## 2023-04-07 ENCOUNTER — Encounter: Payer: Self-pay | Admitting: Urgent Care

## 2023-04-22 ENCOUNTER — Encounter: Payer: Self-pay | Admitting: Urgent Care

## 2023-04-25 ENCOUNTER — Ambulatory Visit: Payer: Managed Care, Other (non HMO) | Admitting: Internal Medicine

## 2023-08-26 ENCOUNTER — Encounter: Payer: Self-pay | Admitting: Urgent Care

## 2023-12-25 ENCOUNTER — Emergency Department

## 2023-12-25 ENCOUNTER — Other Ambulatory Visit: Payer: Self-pay

## 2023-12-25 ENCOUNTER — Emergency Department
Admission: EM | Admit: 2023-12-25 | Discharge: 2023-12-25 | Disposition: A | Discharge status: Home / Self Care | Attending: Emergency Medicine | Admitting: Emergency Medicine

## 2023-12-25 ENCOUNTER — Encounter: Payer: Self-pay | Admitting: Intensive Care

## 2023-12-25 DIAGNOSIS — Y92481 Parking lot as the place of occurrence of the external cause: Secondary | ICD-10-CM | POA: Diagnosis not present

## 2023-12-25 DIAGNOSIS — W010XXA Fall on same level from slipping, tripping and stumbling without subsequent striking against object, initial encounter: Secondary | ICD-10-CM | POA: Diagnosis not present

## 2023-12-25 DIAGNOSIS — S80212A Abrasion, left knee, initial encounter: Secondary | ICD-10-CM | POA: Diagnosis not present

## 2023-12-25 DIAGNOSIS — I10 Essential (primary) hypertension: Secondary | ICD-10-CM | POA: Insufficient documentation

## 2023-12-25 DIAGNOSIS — M25562 Pain in left knee: Secondary | ICD-10-CM

## 2023-12-25 DIAGNOSIS — E039 Hypothyroidism, unspecified: Secondary | ICD-10-CM | POA: Insufficient documentation

## 2023-12-25 NOTE — Discharge Instructions (Signed)
 The xray showed some swelling in the knee joint but no broken bones.   Please keep the ace bandage on it to provide additional support.   You can take 650 mg of Tylenol  and 600 mg of ibuprofen  every 6 hours as needed for pain. You can use ice, heat, muscle creams and other topical pain relievers as well.

## 2023-12-25 NOTE — ED Triage Notes (Signed)
 Patient reports falling in grocery store parking lot yesterday due to grease on ground. Injured left knee.

## 2023-12-25 NOTE — ED Provider Notes (Signed)
 [facility] Provider Note    Event Date/Time   First MD Initiated Contact with Patient 12/25/23 1122     (approximate)   History   Knee Injury   HPI  [name] is a 63 y.o. female with PMH of hypothyroidism, hypertension, and GERD who presents for evaluation of a left knee injury.  Patient states she slipped in a [employer] parking lot and landed on her left knee.  She has had pain and swelling to the area since.  She has been able to bear weight but does have quite a bit of pain with this.      Physical Exam   Triage Vital Signs: ED Triage Vitals  Encounter Vitals Group     BP 12/25/23 1116 (!) 161/82     Girls Systolic BP Percentile --      Girls Diastolic BP Percentile --      Boys Systolic BP Percentile --      Boys Diastolic BP Percentile --      Pulse Rate 12/25/23 1116 97     Resp 12/25/23 1116 18     Temp 12/25/23 1116 98.2 F (36.8 C)     Temp Source 12/25/23 1116 Oral     SpO2 12/25/23 1116 97 %     Weight 12/25/23 1117 110 lb (49.9 kg)     Height 12/25/23 1117 4' 10 (1.473 m)     Head Circumference --      Peak Flow --      Pain Score 12/25/23 1117 7     Pain Loc --      Pain Education --      Exclude from Growth Chart --     Most recent vital signs: Vitals:   12/25/23 1116  BP: (!) 161/82  Pulse: 97  Resp: 18  Temp: 98.2 F (36.8 C)  SpO2: 97%   General: Awake, no distress.  CV:  Good peripheral perfusion.  Resp:  Normal effort. Abd:  No distention.  Other:  Abrasion over the kneecap, mild swelling above the patella, very tender to palpation, range of motion limited due to pain   ED Results / Procedures / Treatments   Labs (all labs ordered are listed, but only abnormal results are displayed) Labs Reviewed - No data to display    RADIOLOGY  Left knee x-ray obtained, interpreted the images as well as reviewed the radiologist report which was negative for fractures but does show a suprapatellar joint  effusion.  PROCEDURES:  Critical Care performed: No  Procedures   MEDICATIONS ORDERED IN ED: Medications - No data to display   IMPRESSION / MDM / ASSESSMENT AND PLAN / ED COURSE  I reviewed the triage vital signs and the nursing notes.                             63 year old female presents for evaluation of a left knee injury.  Blood pressure is elevated otherwise vital signs are stable.  Patient a bit uncomfortable on exam.  Differential diagnosis includes, but is not limited to, knee contusion, joint effusion, less likely knee fracture, ligament injury, meniscus injury.  Patient's presentation is most consistent with acute complicated illness / injury requiring diagnostic workup.  Left knee x-rays negative for fracture but does show suprapatellar joint effusion.  Patient's knee was wrapped with an Ace bandage.  Will also provide crutches for her to use as needed.  Advised RICE therapy and orthopedic follow-up.  Discussed taking OTC pain medications and weight bearing as tolerated.   Patient voiced understanding, all questions were answered and she was stable at discharge.      FINAL CLINICAL IMPRESSION(S) / ED DIAGNOSES   Final diagnoses:  Acute pain of left knee     Rx / DC Orders   ED Discharge Orders     None        Note:  This document was prepared using Dragon voice recognition software and may include unintentional dictation errors.   [name] LABOR, PA-C 12/25/23 1223    Maria Dunnings, MD 12/25/23 [phone]

## 2024-01-26 ENCOUNTER — Encounter: Admitting: Internal Medicine

## 2024-01-26 NOTE — Progress Notes (Deleted)
 Office Visit Note  Patient: [name]             Date of Birth: [birth year]           MRN: [id number]             PCP: [facility] Referring: Leron Millman, NP Visit Date: 01/26/2024 Occupation: @GUAROCC @  Subjective:  No chief complaint on file.   History of Present Illness: [name] is a 63 y.o. female ***     Activities of Daily Living:  Patient reports morning stiffness for *** {minute/hour:19697}.   Patient {ACTIONS;DENIES/REPORTS:21021675::Denies} nocturnal pain.  Difficulty dressing/grooming: {ACTIONS;DENIES/REPORTS:21021675::Denies} Difficulty climbing stairs: {ACTIONS;DENIES/REPORTS:21021675::Denies} Difficulty getting out of chair: {ACTIONS;DENIES/REPORTS:21021675::Denies} Difficulty using hands for taps, buttons, cutlery, and/or writing: {ACTIONS;DENIES/REPORTS:21021675::Denies}  No Rheumatology ROS completed.   PMFS History:  Patient Active Problem List   Diagnosis Date Noted   Acute gastric ulcer without hemorrhage or perforation 12/04/2022   Duodenal ulcer 12/04/2022   Duodenal stenosis 12/04/2022   GI bleeding 12/03/2022   Heme positive stool 12/03/2022   Leg ulcer, left (HCC) 11/14/2022   Pneumococcal pneumonia (HCC) 11/10/2022   Severe sepsis (HCC) 11/09/2022   Intractable nausea and vomiting 05/30/2022   Abdominal pain 05/30/2022   UTI (urinary tract infection) 05/30/2022   Diarrhea 05/30/2022   Gastroenteritis due to COVID-19 virus 05/30/2022   Dehydration 05/30/2022   Iron  deficiency anemia 05/30/2022   Elevated lipase 05/30/2022   Mixed hyperlipidemia 05/30/2022   Abnormal drug screen (12/12/2019) 01/13/2020   Substance use disorder 01/13/2020   Long term prescription benzodiazepine use 01/13/2020   Chronic, continuous use of opioids 01/13/2020   Amphetamine use 01/13/2020   Lumbar facet syndrome (Bilateral) (R>L) 12/12/2019   Chronic sacroiliac joint pain (Left) 12/12/2019   Chronic pain syndrome 12/11/2019    Pharmacologic therapy 12/11/2019   Disorder of skeletal system 12/11/2019   Problems influencing health status 12/11/2019   Benign hypertensive kidney disease with chronic kidney disease 11/07/2019   Insomnia 09/12/2019   Cramps of lower extremity 09/12/2019   Aortic atherosclerosis (HCC) 09/12/2019   GERD (gastroesophageal reflux disease) 09/12/2019   Acute respiratory failure with hypoxia (HCC) 06/05/2019   COPD with acute exacerbation (HCC) 06/05/2019   Acquired hypothyroidism 06/05/2019   Chronic low back pain (1ry area of Pain) (Bilateral) w/o sciatica 08/16/2018   Stage 3 chronic kidney disease (HCC) 08/16/2018   Insomnia due to other mental disorder 08/16/2018   Lesion of epiglottis 08/10/2018   Chronic abdominal pain 01/03/2018   Orthostasis 08/22/2017   Hypercalcemia 08/15/2017   Macrocytosis 08/15/2017   Lung nodules 05/07/2016   COPD (chronic obstructive pulmonary disease) (HCC) 05/07/2016   Chronic obstructive pulmonary disease (HCC) 05/07/2016   Essential hypertension 07/16/2013   Protein-calorie malnutrition, severe (HCC) 01/12/2013   AKI (acute kidney injury) (HCC) 01/11/2013   GI bleed 01/07/2012   Alcohol abuse 01/07/2012   Hypothyroidism 03/03/2010    Past Medical History:  Diagnosis Date   Acute diverticulitis 07/16/2013   Acute renal failure (HCC) 07/16/2013   Arthritis    hands, back, knees   Colitis 07/16/2013   Diverticulitis    Elevated CEA 08/15/2017   GERD (gastroesophageal reflux disease)    GIB (gastrointestinal bleeding) 01/07/2012   Hypertension    somewhat controlled, taking medication   Hypothyroidism    Intractable nausea and vomiting 05/30/2022   Lesion of epiglottis    Macrocytosis without anemia 08/15/2017   Orthostasis 08/22/2017    Family History  Problem Relation Age of Onset  Breast cancer Mother        multiple types of cancer   Stomach cancer Mother    Throat cancer Mother    Melanoma Mother    Kidney disease Father    Past  Surgical History:  Procedure Laterality Date   ABDOMINAL HYSTERECTOMY     APPENDECTOMY     BIOPSY  12/04/2022   Procedure: BIOPSY;  Surgeon: Albertus Gordy HERO, MD;  Location: [facility] ENDOSCOPY;  Service: Gastroenterology;;   CHOLECYSTECTOMY     COLONOSCOPY  01/09/2012   Procedure: COLONOSCOPY;  Surgeon: Jerrell KYM Sol, MD;  Location: [facility] ENDOSCOPY;  Service: Endoscopy;  Laterality: N/A;   COLONOSCOPY WITH PROPOFOL  N/A 08/25/2017   Procedure: COLONOSCOPY WITH PROPOFOL ;  Surgeon: Therisa Bi, MD;  Location: [facility] ENDOSCOPY;  Service: Gastroenterology;  Laterality: N/A;   ESOPHAGOGASTRODUODENOSCOPY  01/07/2012   Procedure: ESOPHAGOGASTRODUODENOSCOPY (EGD);  Surgeon: Lynwood LITTIE Celestia Mickey., MD;  Location: [facility] ENDOSCOPY;  Service: Endoscopy;  Laterality: N/A;   ESOPHAGOGASTRODUODENOSCOPY (EGD) WITH PROPOFOL  N/A 08/25/2017   Procedure: ESOPHAGOGASTRODUODENOSCOPY (EGD) WITH PROPOFOL ;  Surgeon: Therisa Bi, MD;  Location: [facility] ENDOSCOPY;  Service: Gastroenterology;  Laterality: N/A;   ESOPHAGOGASTRODUODENOSCOPY (EGD) WITH PROPOFOL  N/A 12/04/2022   Procedure: ESOPHAGOGASTRODUODENOSCOPY (EGD) WITH PROPOFOL ;  Surgeon: Albertus Gordy HERO, MD;  Location: [facility] ENDOSCOPY;  Service: Gastroenterology;  Laterality: N/A;   VIDEO BRONCHOSCOPY Bilateral 05/14/2016   Procedure: VIDEO BRONCHOSCOPY WITHOUT FLUORO;  Surgeon: Lamar GORMAN Chris, MD;  Location: [facility] ENDOSCOPY;  Service: Cardiopulmonary;  Laterality: Bilateral;   Social History   Social History Narrative   Works at [employer]; trying to quit 2/cig [~40 years]; glass of wine every other day.   Immunization History  Administered Date(s) Administered   Influenza Inj Mdck Quad Pf 04/01/2017, 03/13/2018   Influenza,inj,Quad PF,6+ Mos 01/30/2019   Influenza-Unspecified 03/15/2016   PFIZER(Purple Top)SARS-COV-2 Vaccination 04/25/2020     Objective: Vital Signs: There were no vitals taken for this visit.   Physical Exam   Musculoskeletal Exam: ***  CDAI Exam: CDAI Score: -- Patient  Global: --; Provider Global: -- Swollen: --; Tender: -- Joint Exam 01/26/2024   No joint exam has been documented for this visit   There is currently no information documented on the homunculus. Go to the Rheumatology activity and complete the homunculus joint exam.  Investigation: No additional findings.  Imaging: No results found.  Recent Labs: Lab Results  Component Value Date   WBC 7.1 12/05/2022   HGB 9.3 (L) 12/05/2022   PLT 257 12/05/2022   NA 141 12/05/2022   K 3.8 12/05/2022   CL 114 (H) 12/05/2022   CO2 19 (L) 12/05/2022   GLUCOSE 76 12/05/2022   BUN 14 12/05/2022   CREATININE 1.50 (H) 12/05/2022   BILITOT 0.7 12/05/2022   ALKPHOS 71 12/05/2022   AST 17 12/05/2022   ALT 14 12/05/2022   PROT 4.3 (L) 12/05/2022   ALBUMIN 1.9 (L) 12/05/2022   CALCIUM  8.2 (L) 12/05/2022   GFRAA 49 (L) 01/02/2020    Speciality Comments: No specialty comments available.  Procedures:  No procedures performed Allergies: Patient has no known allergies.   Assessment / Plan:     Visit Diagnoses: No diagnosis found.  Orders: No orders of the defined types were placed in this encounter.  No orders of the defined types were placed in this encounter.   Face-to-face time spent with patient was *** minutes. Greater than 50% of time was spent in counseling and coordination of care.  Follow-Up Instructions: No follow-ups on file.  Lonni LELON Ester, MD  Note - This record has been created using [employer].  Chart creation errors have been sought, but may not always  have been located. Such creation errors do not reflect on  the standard of medical care.

## 2024-03-15 ENCOUNTER — Other Ambulatory Visit: Payer: Self-pay

## 2024-03-15 ENCOUNTER — Inpatient Hospital Stay
Admission: EM | Admit: 2024-03-15 | Discharge: 2024-03-17 | DRG: 190 | Disposition: A | Discharge status: Home / Self Care | Attending: Internal Medicine | Admitting: Internal Medicine

## 2024-03-15 ENCOUNTER — Emergency Department

## 2024-03-15 DIAGNOSIS — K219 Gastro-esophageal reflux disease without esophagitis: Secondary | ICD-10-CM | POA: Diagnosis present

## 2024-03-15 DIAGNOSIS — Z79899 Other long term (current) drug therapy: Secondary | ICD-10-CM

## 2024-03-15 DIAGNOSIS — F17210 Nicotine dependence, cigarettes, uncomplicated: Secondary | ICD-10-CM | POA: Diagnosis present

## 2024-03-15 DIAGNOSIS — G8929 Other chronic pain: Secondary | ICD-10-CM | POA: Diagnosis present

## 2024-03-15 DIAGNOSIS — E039 Hypothyroidism, unspecified: Secondary | ICD-10-CM | POA: Diagnosis present

## 2024-03-15 DIAGNOSIS — I10 Essential (primary) hypertension: Secondary | ICD-10-CM | POA: Diagnosis present

## 2024-03-15 DIAGNOSIS — M19042 Primary osteoarthritis, left hand: Secondary | ICD-10-CM | POA: Diagnosis present

## 2024-03-15 DIAGNOSIS — M545 Low back pain, unspecified: Secondary | ICD-10-CM | POA: Diagnosis present

## 2024-03-15 DIAGNOSIS — G894 Chronic pain syndrome: Secondary | ICD-10-CM | POA: Diagnosis present

## 2024-03-15 DIAGNOSIS — N182 Chronic kidney disease, stage 2 (mild): Secondary | ICD-10-CM | POA: Diagnosis present

## 2024-03-15 DIAGNOSIS — Z90710 Acquired absence of both cervix and uterus: Secondary | ICD-10-CM

## 2024-03-15 DIAGNOSIS — F1010 Alcohol abuse, uncomplicated: Secondary | ICD-10-CM | POA: Diagnosis present

## 2024-03-15 DIAGNOSIS — Z79890 Hormone replacement therapy (postmenopausal): Secondary | ICD-10-CM

## 2024-03-15 DIAGNOSIS — Z9049 Acquired absence of other specified parts of digestive tract: Secondary | ICD-10-CM

## 2024-03-15 DIAGNOSIS — J441 Chronic obstructive pulmonary disease with (acute) exacerbation: Principal | ICD-10-CM | POA: Diagnosis present

## 2024-03-15 DIAGNOSIS — M19041 Primary osteoarthritis, right hand: Secondary | ICD-10-CM | POA: Diagnosis present

## 2024-03-15 DIAGNOSIS — J9601 Acute respiratory failure with hypoxia: Secondary | ICD-10-CM | POA: Diagnosis present

## 2024-03-15 DIAGNOSIS — E782 Mixed hyperlipidemia: Secondary | ICD-10-CM | POA: Diagnosis present

## 2024-03-15 DIAGNOSIS — M170 Bilateral primary osteoarthritis of knee: Secondary | ICD-10-CM | POA: Diagnosis present

## 2024-03-15 DIAGNOSIS — Z1152 Encounter for screening for COVID-19: Secondary | ICD-10-CM

## 2024-03-15 DIAGNOSIS — M479 Spondylosis, unspecified: Secondary | ICD-10-CM | POA: Diagnosis present

## 2024-03-15 DIAGNOSIS — Z8419 Family history of other disorders of kidney and ureter: Secondary | ICD-10-CM

## 2024-03-15 DIAGNOSIS — I129 Hypertensive chronic kidney disease with stage 1 through stage 4 chronic kidney disease, or unspecified chronic kidney disease: Secondary | ICD-10-CM | POA: Diagnosis present

## 2024-03-15 LAB — BASIC METABOLIC PANEL WITH GFR
Anion gap: 10 (ref 5–15)
BUN: 17 mg/dL (ref 8–23)
CO2: 23 mmol/L (ref 22–32)
Calcium: 9.7 mg/dL (ref 8.9–10.3)
Chloride: 106 mmol/L (ref 98–111)
Creatinine, Ser: 1.11 mg/dL — ABNORMAL HIGH (ref 0.44–1.00)
GFR, Estimated: 57 mL/min — ABNORMAL LOW (ref >60)
Glucose, Bld: 117 mg/dL — ABNORMAL HIGH (ref 70–99)
Potassium: 3.9 mmol/L (ref 3.5–5.1)
Sodium: 139 mmol/L (ref 135–145)

## 2024-03-15 LAB — TROPONIN I (HIGH SENSITIVITY): Troponin I (High Sensitivity): 4 ng/L (ref <18)

## 2024-03-15 LAB — CBC
HCT: 41.1 % (ref 36.0–46.0)
Hemoglobin: 13.9 g/dL (ref 12.0–15.0)
MCH: 32.1 pg (ref 26.0–34.0)
MCHC: 33.8 g/dL (ref 30.0–36.0)
MCV: 94.9 fL (ref 80.0–100.0)
Platelets: 233 K/uL (ref 150–400)
RBC: 4.33 MIL/uL (ref 3.87–5.11)
RDW: 13.2 % (ref 11.5–15.5)
WBC: 7.3 K/uL (ref 4.0–10.5)
nRBC: 0 % (ref 0.0–0.2)

## 2024-03-15 LAB — BLOOD GAS, VENOUS
Acid-Base Excess: 1.7 mmol/L (ref 0.0–2.0)
Bicarbonate: 26.6 mmol/L (ref 20.0–28.0)
O2 Saturation: 91.8 %
Patient temperature: 37
pCO2, Ven: 42 mmHg — ABNORMAL LOW (ref 44–60)
pH, Ven: 7.41 (ref 7.25–7.43)
pO2, Ven: 55 mmHg — ABNORMAL HIGH (ref 32–45)

## 2024-03-15 MED ORDER — IPRATROPIUM-ALBUTEROL 0.5-2.5 (3) MG/3ML IN SOLN
9.0000 mL | Freq: Once | RESPIRATORY_TRACT | Status: AC
  Administered 2024-03-15: 9 mL via RESPIRATORY_TRACT
  Filled 2024-03-15: qty 9

## 2024-03-15 MED ORDER — MAGNESIUM SULFATE 2 GM/50ML IV SOLN
2.0000 g | Freq: Once | INTRAVENOUS | Status: AC
  Administered 2024-03-15: 2 g via INTRAVENOUS
  Filled 2024-03-15: qty 50

## 2024-03-15 MED ORDER — METHYLPREDNISOLONE SODIUM SUCC 125 MG IJ SOLR
125.0000 mg | Freq: Once | INTRAMUSCULAR | Status: AC
Start: 2024-03-15 — End: 2024-03-15
  Administered 2024-03-15: 125 mg via INTRAVENOUS
  Filled 2024-03-15: qty 2

## 2024-03-15 NOTE — ED Triage Notes (Signed)
 Pt comes with c/o sob and cough since last week. Pt states it has gotten worse. Pt states it doesn't matter what she is doing she gets real sob. Pt states little cp. Pt feels like she has to grasp for air.

## 2024-03-15 NOTE — ED Provider Notes (Signed)
 [facility] Provider Note    Event Date/Time   First MD Initiated Contact with Patient 03/15/24 2209     (approximate)   History   Chief Complaint Shortness of Breath   HPI  [name] is a 63 y.o. female with past medical history of COPD, CKD, hypothyroidism, chronic pain syndrome, and alcohol abuse who presents to the ED complaining of shortness of breath.  Patient reports that she has been dealing with a dry cough and increasing difficulty breathing for the past week.  She denies any fevers and has not had any pain in her chest except when she coughs.  She reports that she stopped smoking cigarettes about 1 week ago due to increasing difficulty breathing.  She has not had any pain or swelling in her legs.     Physical Exam   Triage Vital Signs: ED Triage Vitals  Encounter Vitals Group     BP 03/15/24 1518 (!) 158/75     Girls Systolic BP Percentile --      Girls Diastolic BP Percentile --      Boys Systolic BP Percentile --      Boys Diastolic BP Percentile --      Pulse Rate 03/15/24 1518 98     Resp 03/15/24 1518 (!) 24     Temp 03/15/24 1518 98 F (36.7 C)     Temp src --      SpO2 03/15/24 1517 96 %     Weight 03/15/24 1518 111 lb (50.3 kg)     Height 03/15/24 1518 4' 10 (1.473 m)     Head Circumference --      Peak Flow --      Pain Score 03/15/24 1517 9     Pain Loc --      Pain Education --      Exclude from Growth Chart --     Most recent vital signs: Vitals:   03/15/24 2319 03/15/24 2345  BP:    Pulse: (!) 119 97  Resp:    Temp:    SpO2: 98% 98%    Constitutional: Alert and oriented. Eyes: Conjunctivae are normal. Head: Atraumatic. Nose: No congestion/rhinnorhea. Mouth/Throat: Mucous membranes are moist.  Cardiovascular: Normal rate, regular rhythm. Grossly normal heart sounds.  2+ radial pulses bilaterally. Respiratory: Tachypneic with increased work of breathing, wheezing and poor air movement  throughout. Gastrointestinal: Soft and nontender. No distention. Musculoskeletal: No lower extremity tenderness nor edema.  Neurologic:  Normal speech and language. No gross focal neurologic deficits are appreciated.    ED Results / Procedures / Treatments   Labs (all labs ordered are listed, but only abnormal results are displayed) Labs Reviewed  BASIC METABOLIC PANEL WITH GFR - Abnormal; Notable for the following components:      Result Value   Glucose, Bld 117 (*)    Creatinine, Ser 1.11 (*)    GFR, Estimated 57 (*)    All other components within normal limits  BLOOD GAS, VENOUS - Abnormal; Notable for the following components:   pCO2, Ven 42 (*)    pO2, Ven 55 (*)    All other components within normal limits  CBC  TROPONIN I (HIGH SENSITIVITY)     EKG  ED ECG REPORT I, Carlin Palin, the attending physician, personally viewed and interpreted this ECG.   Date: 03/15/2024  EKG Time: 15:23  Rate: 89  Rhythm: normal sinus rhythm  Axis: Normal  Intervals:none  ST&T Change: None  RADIOLOGY  Chest x-ray reviewed and interpreted by me with no infiltrate, edema, or effusion.  PROCEDURES:  Critical Care performed: Yes, see critical care procedure note(s)  .Critical Care  Performed by: [name], MD Authorized by: [name], MD   Critical care provider statement:    Critical care time (minutes):  30   Critical care time was exclusive of:  Teaching time and separately billable procedures and treating other patients   Critical care was necessary to treat or prevent imminent or life-threatening deterioration of the following conditions:  Respiratory failure   Critical care was time spent personally by me on the following activities:  Development of treatment plan with patient or surrogate, discussions with consultants, evaluation of patient's response to treatment, examination of patient, ordering and review of laboratory studies, ordering and review of  radiographic studies, ordering and performing treatments and interventions, pulse oximetry, re-evaluation of patient's condition and review of old charts   I assumed direction of critical care for this patient from another provider in my specialty: no     Care discussed with: admitting provider      MEDICATIONS ORDERED IN ED: Medications  ipratropium-albuterol  (DUONEB) 0.5-2.5 (3) MG/3ML nebulizer solution 9 mL (9 mLs Nebulization Given 03/15/24 2245)  methylPREDNISolone  sodium succinate (SOLU-MEDROL ) 125 mg/2 mL injection 125 mg (125 mg Intravenous Given 03/15/24 2249)  magnesium  sulfate IVPB 2 g 50 mL (2 g Intravenous New Bag/Given 03/15/24 2250)     IMPRESSION / MDM / ASSESSMENT AND PLAN / ED COURSE  I reviewed the triage vital signs and the nursing notes.                              63 y.o. female with past medical history of COPD, hypothyroidism, CKD, alcohol abuse, and chronic pain syndrome who presents to the ED with increasing difficulty breathing over the past week.  Patient's presentation is most consistent with acute presentation with potential threat to life or bodily function.  Differential diagnosis includes, but is not limited to, ACS, PE, pneumonia, COPD exacerbation, CHF, anemia, electrolyte abnormality, AKI.  Patient tachypneic with increased work of breathing, poor air movement with significant wheezing noted throughout.  She is currently maintaining oxygen saturations, was given 3 DuoNebs back-to-back along with IV Solu-Medrol  and IV magnesium .  She continued to have significantly increased work of breathing, subsequently placed on BiPAP with improvement.  EKG without evidence of arrhythmia or ischemia, troponin within normal limits and I doubt ACS or PE.  Chest x-ray unremarkable and additional labs without significant anemia, leukocytosis, electrolyte abnormality, or AKI.      FINAL CLINICAL IMPRESSION(S) / ED DIAGNOSES   Final diagnoses:  COPD exacerbation (HCC)      Rx / DC Orders   ED Discharge Orders     None        Note:  This document was prepared using Dragon voice recognition software and may include unintentional dictation errors.   [name], MD 03/16/24 [phone]

## 2024-03-15 NOTE — Progress Notes (Signed)
 Patient placed on BIPAP due to increase work of breathing and shortness of breath.  Patient is very anxious but tolerating at this time.

## 2024-03-16 DIAGNOSIS — M19042 Primary osteoarthritis, left hand: Secondary | ICD-10-CM | POA: Diagnosis present

## 2024-03-16 DIAGNOSIS — J441 Chronic obstructive pulmonary disease with (acute) exacerbation: Principal | ICD-10-CM

## 2024-03-16 DIAGNOSIS — M19041 Primary osteoarthritis, right hand: Secondary | ICD-10-CM | POA: Diagnosis present

## 2024-03-16 DIAGNOSIS — M170 Bilateral primary osteoarthritis of knee: Secondary | ICD-10-CM | POA: Diagnosis present

## 2024-03-16 DIAGNOSIS — J9601 Acute respiratory failure with hypoxia: Secondary | ICD-10-CM | POA: Diagnosis present

## 2024-03-16 DIAGNOSIS — R0602 Shortness of breath: Secondary | ICD-10-CM | POA: Diagnosis present

## 2024-03-16 DIAGNOSIS — Z90710 Acquired absence of both cervix and uterus: Secondary | ICD-10-CM | POA: Diagnosis not present

## 2024-03-16 DIAGNOSIS — Z9049 Acquired absence of other specified parts of digestive tract: Secondary | ICD-10-CM | POA: Diagnosis not present

## 2024-03-16 DIAGNOSIS — Z8419 Family history of other disorders of kidney and ureter: Secondary | ICD-10-CM | POA: Diagnosis not present

## 2024-03-16 DIAGNOSIS — Z79890 Hormone replacement therapy (postmenopausal): Secondary | ICD-10-CM | POA: Diagnosis not present

## 2024-03-16 DIAGNOSIS — I129 Hypertensive chronic kidney disease with stage 1 through stage 4 chronic kidney disease, or unspecified chronic kidney disease: Secondary | ICD-10-CM | POA: Diagnosis present

## 2024-03-16 DIAGNOSIS — N182 Chronic kidney disease, stage 2 (mild): Secondary | ICD-10-CM | POA: Diagnosis present

## 2024-03-16 DIAGNOSIS — Z79899 Other long term (current) drug therapy: Secondary | ICD-10-CM | POA: Diagnosis not present

## 2024-03-16 DIAGNOSIS — M479 Spondylosis, unspecified: Secondary | ICD-10-CM | POA: Diagnosis present

## 2024-03-16 DIAGNOSIS — E039 Hypothyroidism, unspecified: Secondary | ICD-10-CM | POA: Diagnosis present

## 2024-03-16 DIAGNOSIS — F17210 Nicotine dependence, cigarettes, uncomplicated: Secondary | ICD-10-CM | POA: Diagnosis present

## 2024-03-16 DIAGNOSIS — G894 Chronic pain syndrome: Secondary | ICD-10-CM | POA: Diagnosis present

## 2024-03-16 DIAGNOSIS — Z1152 Encounter for screening for COVID-19: Secondary | ICD-10-CM | POA: Diagnosis not present

## 2024-03-16 DIAGNOSIS — M545 Low back pain, unspecified: Secondary | ICD-10-CM | POA: Diagnosis present

## 2024-03-16 DIAGNOSIS — K219 Gastro-esophageal reflux disease without esophagitis: Secondary | ICD-10-CM | POA: Diagnosis present

## 2024-03-16 DIAGNOSIS — E782 Mixed hyperlipidemia: Secondary | ICD-10-CM | POA: Diagnosis present

## 2024-03-16 DIAGNOSIS — F1010 Alcohol abuse, uncomplicated: Secondary | ICD-10-CM | POA: Diagnosis present

## 2024-03-16 LAB — RESP PANEL BY RT-PCR (RSV, FLU A&B, COVID)  RVPGX2
Influenza A by PCR: NEGATIVE
Influenza B by PCR: NEGATIVE
Resp Syncytial Virus by PCR: NEGATIVE
SARS Coronavirus 2 by RT PCR: NEGATIVE

## 2024-03-16 LAB — CBC
HCT: 44.8 % (ref 36.0–46.0)
Hemoglobin: 15.2 g/dL — ABNORMAL HIGH (ref 12.0–15.0)
MCH: 31.9 pg (ref 26.0–34.0)
MCHC: 33.9 g/dL (ref 30.0–36.0)
MCV: 93.9 fL (ref 80.0–100.0)
Platelets: 248 K/uL (ref 150–400)
RBC: 4.77 MIL/uL (ref 3.87–5.11)
RDW: 12.9 % (ref 11.5–15.5)
WBC: 13.1 K/uL — ABNORMAL HIGH (ref 4.0–10.5)
nRBC: 0 % (ref 0.0–0.2)

## 2024-03-16 LAB — CREATININE, SERUM
Creatinine, Ser: 0.89 mg/dL (ref 0.44–1.00)
GFR, Estimated: >60 mL/min (ref >60)

## 2024-03-16 MED ORDER — GUAIFENESIN ER 600 MG PO TB12
600.0000 mg | ORAL_TABLET | Freq: Two times a day (BID) | ORAL | Status: DC
  Administered 2024-03-16 – 2024-03-17 (×4): 600 mg via ORAL
  Filled 2024-03-16 (×4): qty 1

## 2024-03-16 MED ORDER — METHYLPREDNISOLONE SODIUM SUCC 40 MG IJ SOLR
40.0000 mg | Freq: Two times a day (BID) | INTRAMUSCULAR | Status: AC
  Administered 2024-03-16 (×2): 40 mg via INTRAVENOUS
  Filled 2024-03-16 (×2): qty 1

## 2024-03-16 MED ORDER — ROSUVASTATIN CALCIUM 5 MG PO TABS
5.0000 mg | ORAL_TABLET | Freq: Every evening | ORAL | Status: DC
  Filled 2024-03-16 (×2): qty 1

## 2024-03-16 MED ORDER — IPRATROPIUM-ALBUTEROL 0.5-2.5 (3) MG/3ML IN SOLN
3.0000 mL | Freq: Four times a day (QID) | RESPIRATORY_TRACT | Status: DC
  Administered 2024-03-16 – 2024-03-17 (×4): 3 mL via RESPIRATORY_TRACT
  Filled 2024-03-16 (×5): qty 3

## 2024-03-16 MED ORDER — MORPHINE SULFATE (PF) 2 MG/ML IV SOLN: 2.0000 mg | INTRAVENOUS | Status: DC | PRN

## 2024-03-16 MED ORDER — ONDANSETRON HCL 4 MG/2ML IJ SOLN: 4.0000 mg | Freq: Four times a day (QID) | INTRAMUSCULAR | Status: DC | PRN

## 2024-03-16 MED ORDER — SODIUM CHLORIDE 0.9 % IV SOLN
1.0000 g | INTRAVENOUS | Status: DC
  Administered 2024-03-16 – 2024-03-17 (×2): 1 g via INTRAVENOUS
  Filled 2024-03-16 (×2): qty 10

## 2024-03-16 MED ORDER — TRAZODONE HCL 50 MG PO TABS
50.0000 mg | ORAL_TABLET | Freq: Every day | ORAL | Status: DC
  Administered 2024-03-16: 100 mg via ORAL
  Filled 2024-03-16: qty 2

## 2024-03-16 MED ORDER — ONDANSETRON HCL 4 MG PO TABS: 4.0000 mg | ORAL_TABLET | Freq: Four times a day (QID) | ORAL | Status: DC | PRN

## 2024-03-16 MED ORDER — POLYETHYLENE GLYCOL 3350 17 G PO PACK: 17.0000 g | PACK | Freq: Every day | ORAL | Status: DC | PRN

## 2024-03-16 MED ORDER — AMLODIPINE BESYLATE 5 MG PO TABS
10.0000 mg | ORAL_TABLET | Freq: Every day | ORAL | Status: DC
  Administered 2024-03-16 – 2024-03-17 (×2): 10 mg via ORAL
  Filled 2024-03-16 (×2): qty 2

## 2024-03-16 MED ORDER — PREDNISONE 20 MG PO TABS
40.0000 mg | ORAL_TABLET | Freq: Every day | ORAL | Status: DC
  Administered 2024-03-17: 40 mg via ORAL
  Filled 2024-03-16: qty 2

## 2024-03-16 MED ORDER — ACETAMINOPHEN 650 MG RE SUPP: 650.0000 mg | Freq: Four times a day (QID) | RECTAL | Status: DC | PRN

## 2024-03-16 MED ORDER — HYDRALAZINE HCL 20 MG/ML IJ SOLN
5.0000 mg | INTRAMUSCULAR | Status: DC | PRN
  Administered 2024-03-16: 5 mg via INTRAVENOUS
  Filled 2024-03-16: qty 1

## 2024-03-16 MED ORDER — ENOXAPARIN SODIUM 40 MG/0.4ML IJ SOSY
40.0000 mg | PREFILLED_SYRINGE | INTRAMUSCULAR | Status: DC
  Administered 2024-03-16 – 2024-03-17 (×2): 40 mg via SUBCUTANEOUS
  Filled 2024-03-16 (×2): qty 0.4

## 2024-03-16 MED ORDER — METHYLPREDNISOLONE SODIUM SUCC 40 MG IJ SOLR
40.0000 mg | Freq: Two times a day (BID) | INTRAMUSCULAR | Status: DC
Start: 2024-03-16 — End: 2024-03-16

## 2024-03-16 MED ORDER — ALBUTEROL SULFATE (2.5 MG/3ML) 0.083% IN NEBU: 2.5000 mg | INHALATION_SOLUTION | RESPIRATORY_TRACT | Status: DC | PRN

## 2024-03-16 MED ORDER — PANTOPRAZOLE SODIUM 40 MG IV SOLR
40.0000 mg | Freq: Two times a day (BID) | INTRAVENOUS | Status: DC
  Administered 2024-03-16 (×2): 40 mg via INTRAVENOUS
  Filled 2024-03-16 (×2): qty 10

## 2024-03-16 MED ORDER — HYDROCODONE-ACETAMINOPHEN 5-325 MG PO TABS: 1.0000 | ORAL_TABLET | ORAL | Status: DC | PRN

## 2024-03-16 MED ORDER — LEVOTHYROXINE SODIUM 88 MCG PO TABS
88.0000 ug | ORAL_TABLET | Freq: Every day | ORAL | Status: DC
  Administered 2024-03-17: 88 ug via ORAL
  Filled 2024-03-16: qty 1

## 2024-03-16 MED ORDER — NICOTINE 14 MG/24HR TD PT24
14.0000 mg | MEDICATED_PATCH | Freq: Once | TRANSDERMAL | Status: DC
  Administered 2024-03-16: 14 mg via TRANSDERMAL
  Filled 2024-03-16: qty 1

## 2024-03-16 MED ORDER — ACETAMINOPHEN 325 MG PO TABS: 650.0000 mg | ORAL_TABLET | Freq: Four times a day (QID) | ORAL | Status: DC | PRN

## 2024-03-16 MED ORDER — ENOXAPARIN SODIUM 40 MG/0.4ML IJ SOSY: 40.0000 mg | PREFILLED_SYRINGE | INTRAMUSCULAR | Status: DC

## 2024-03-16 NOTE — ED Notes (Addendum)
 Report received, pt moved to bed in room 37. Notable labored breathing on exertion moving from wheel chair to bed. Pt on 3L of O2 via [place]. BiPap at bedside but pt reports she cannot tolerate it due to claustrophobia and it makes her mouth too dry. Pt provided with blankets, denies any additional needs at this time, call bell within reach

## 2024-03-16 NOTE — ED Notes (Signed)
Covid swab sent at this time

## 2024-03-16 NOTE — ED Notes (Signed)
 Pt ambulated to the bathroom at this time. Gait steady.

## 2024-03-16 NOTE — ED Notes (Signed)
 Went into pts rm after noticing that monitor leads were off. Pt had unplugged herself and gotten up to the bathroom. Pt hooked up to the monitor. O2 reapplied. O2 sats at 100%. Pt brething remains labored.

## 2024-03-16 NOTE — ED Notes (Signed)
 Upon entering rm pt had [place] off. This RN explained to pt importance of keeping O2 on. Pt stated she would put it on after her breathing treatment. Pt still remains adamant that she does not need the bipap.

## 2024-03-16 NOTE — H&P (Addendum)
 History and Physical    [name] FMW:[id number] DOB: 1961/05/07 DOA: 03/15/2024  DOS: the patient was seen and examined on 03/15/2024  PCP: [facility]   Patient coming from: Home  I have personally briefly reviewed patient's old medical records in [facility]  Chief Complaint: Shortness of breath  HPI: [name] is a pleasant 63 y.o. female with medical history significant for COPD, CKD, HTN, chronic pain syndrome, alcohol abuse and chronic smoking recently quit within 2 weeks ago who presented to ED complaining of shortness of breath for a week duration.  She stated that she had a cough and shortness of breath since last week.  She stated that it has gotten progressively worse.  She does not use oxygen and she does not have a diagnosis of COPD prior.  She stated that she was gasping for air.  She stated that her cough was nonproductive.  She denies any leg swelling.  She denies any fever, chest pain, palpitations.  ED Course: Upon arrival to the ED, patient is found to be gasping for air, hypertensive 158/75, tachypneic at 24, hypoxemic requiring oxygen, chest x-ray did not show any pneumonia or effusion.  Patient was given DuoNeb nebulization, methylprednisolone  125 mg, IV magnesium  sulfate.  Hospitalist service was consulted for evaluation for admission.  Review of Systems:  ROS  All other systems negative except as noted in the HPI.  Past Medical History:  Diagnosis Date   Acute diverticulitis 07/16/2013   Acute renal failure 07/16/2013   Arthritis    hands, back, knees   Colitis 07/16/2013   Diverticulitis    Elevated CEA 08/15/2017   GERD (gastroesophageal reflux disease)    GIB (gastrointestinal bleeding) 01/07/2012   Hypertension    somewhat controlled, taking medication   Hypothyroidism    Intractable nausea and vomiting 05/30/2022   Lesion of epiglottis    Macrocytosis without anemia 08/15/2017   Orthostasis 08/22/2017    Past Surgical History:   Procedure Laterality Date   ABDOMINAL HYSTERECTOMY     APPENDECTOMY     BIOPSY  12/04/2022   Procedure: BIOPSY;  Surgeon: Albertus Gordy HERO, MD;  Location: [facility] ENDOSCOPY;  Service: Gastroenterology;;   CHOLECYSTECTOMY     COLONOSCOPY  01/09/2012   Procedure: COLONOSCOPY;  Surgeon: Jerrell KYM Sol, MD;  Location: [facility] ENDOSCOPY;  Service: Endoscopy;  Laterality: N/A;   COLONOSCOPY WITH PROPOFOL  N/A 08/25/2017   Procedure: COLONOSCOPY WITH PROPOFOL ;  Surgeon: Therisa Bi, MD;  Location: [facility] ENDOSCOPY;  Service: Gastroenterology;  Laterality: N/A;   ESOPHAGOGASTRODUODENOSCOPY  01/07/2012   Procedure: ESOPHAGOGASTRODUODENOSCOPY (EGD);  Surgeon: Lynwood LITTIE Celestia Mickey., MD;  Location: [facility] ENDOSCOPY;  Service: Endoscopy;  Laterality: N/A;   ESOPHAGOGASTRODUODENOSCOPY (EGD) WITH PROPOFOL  N/A 08/25/2017   Procedure: ESOPHAGOGASTRODUODENOSCOPY (EGD) WITH PROPOFOL ;  Surgeon: Therisa Bi, MD;  Location: [facility] ENDOSCOPY;  Service: Gastroenterology;  Laterality: N/A;   ESOPHAGOGASTRODUODENOSCOPY (EGD) WITH PROPOFOL  N/A 12/04/2022   Procedure: ESOPHAGOGASTRODUODENOSCOPY (EGD) WITH PROPOFOL ;  Surgeon: Albertus Gordy HERO, MD;  Location: [facility] ENDOSCOPY;  Service: Gastroenterology;  Laterality: N/A;   VIDEO BRONCHOSCOPY Bilateral 05/14/2016   Procedure: VIDEO BRONCHOSCOPY WITHOUT FLUORO;  Surgeon: Lamar GORMAN Chris, MD;  Location: [facility] ENDOSCOPY;  Service: Cardiopulmonary;  Laterality: Bilateral;     reports that she has been smoking cigarettes. She started smoking about 50 years ago. She has a 84 pack-year smoking history. She has never used smokeless tobacco. She reports current alcohol use of about 35.0 standard drinks of alcohol per week. She reports that  she does not use drugs.  No Known Allergies  Family History  Problem Relation Age of Onset   Breast cancer Mother        multiple types of cancer   Stomach cancer Mother    Throat cancer Mother    Melanoma Mother    Kidney disease Father     Prior to Admission medications    Medication Sig Start Date End Date Taking? Authorizing Provider  albuterol  (VENTOLIN  HFA) 108 (90 Base) MCG/ACT inhaler Inhale 2 puffs into the lungs every 6 (six) hours as needed for wheezing or shortness of breath. 06/26/19  Yes [provider]  Biotin 5 MG TABS Take 1 tablet by mouth daily. 08/10/18  Yes [provider]  Cholecalciferol  125 MCG (5000 UT) TABS Take 10,000 Units by mouth daily. 08/10/18  Yes [provider]  cyclobenzaprine  (FLEXERIL ) 10 MG tablet Take 10 mg by mouth 2 (two) times daily as needed. 09/27/22  Yes [provider]  diclofenac  Sodium (VOLTAREN ) 1 % GEL Apply 2 g topically 3 (three) times daily as needed (to thumb for pain). 01/14/21  Yes [provider]  HYDROcodone -acetaminophen  (NORCO/VICODIN) 5-325 MG tablet Take 2 tablets by mouth 3 (three) times daily as needed. 10/06/22  Yes [provider]  levothyroxine  (SYNTHROID ) 88 MCG tablet Take 88 mcg by mouth daily. 02/24/24  Yes [provider]  naloxone (NARCAN) nasal spray 4 mg/0.1 mL Place 1 spray into the nose once. 09/27/22  Yes [provider]  traZODone  (DESYREL ) 50 MG tablet Take 50-100 mg by mouth at bedtime. 10/17/22  Yes [provider]  amLODipine  (NORVASC ) 10 MG tablet Take 1 tablet (10 mg total) by mouth daily. Patient not taking: Reported on 03/16/2024 12/05/22   Singh, Prashant K, MD  ferrous sulfate  325 (65 FE) MG tablet Take 1 tablet (325 mg total) by mouth 2 (two) times daily with a meal. 12/05/22 02/03/23  Dennise Lavada POUR, MD  folic acid  (FOLVITE ) 1 MG tablet Take 1 tablet (1 mg total) by mouth daily. Patient not taking: Reported on 03/16/2024 12/05/22   Singh, Prashant K, MD  pantoprazole  (PROTONIX ) 40 MG tablet Take 1 tablet (40 mg total) by mouth 2 (two) times daily. 12/05/22 04/04/23  Dennise Lavada POUR, MD  rosuvastatin  (CRESTOR ) 5 MG tablet Take 1 tablet (5 mg total) by mouth every evening. For cholesterol. Patient not taking:  Reported on 11/09/2022 09/17/19   Gretta Comer POUR, NP    Physical Exam: Vitals:   03/16/24 0700 03/16/24 0802 03/16/24 0850 03/16/24 0948  BP: (!) 148/70  (!) 143/71   Pulse: 91 93 (!) 104   Resp: 16 16 18    Temp:    97.8 F (36.6 C)  TempSrc:    Oral  SpO2: 99% 99% 96%   Weight:      Height:        Physical Exam   Constitutional: Alert, awake, calm, comfortable HEENT: Neck supple Respiratory: Bilateral decreased air entry at the bases, extensive wheezing on both lung fields, scattered rhonchi. Cardiovascular: Regular rate and rhythm, no murmurs / rubs / gallops. No extremity edema. 2+ pedal pulses. No carotid bruits.  Abdomen: Soft, no tenderness, Bowel sounds positive.  Musculoskeletal: no clubbing / cyanosis. Good ROM, no contractures. Normal muscle tone.  Skin: no rashes, lesions, ulcers. Neurologic: CN 2-12 grossly intact. Sensation intact, No focal deficit identified Psychiatric: Alert and oriented x 3. Normal mood.    Labs on Admission: I have personally reviewed following labs and  imaging studies  CBC: Recent Labs  Lab 03/15/24 1519  WBC 7.3  HGB 13.9  HCT 41.1  MCV 94.9  PLT 233   Basic Metabolic Panel: Recent Labs  Lab 03/15/24 1519  NA 139  K 3.9  CL 106  CO2 23  GLUCOSE 117*  BUN 17  CREATININE 1.11*  CALCIUM  9.7   GFR: Estimated Creatinine Clearance: 37.6 mL/min (A) (by C-G formula based on SCr of 1.11 mg/dL (H)). Liver Function Tests: No results for input(s): AST, ALT, ALKPHOS, BILITOT, PROT, ALBUMIN in the last 168 hours. No results for input(s): LIPASE, AMYLASE in the last 168 hours. No results for input(s): AMMONIA in the last 168 hours. Coagulation Profile: No results for input(s): INR, PROTIME in the last 168 hours. Cardiac Enzymes: Recent Labs  Lab 03/15/24 1519  TROPONINIHS 4   BNP (last 3 results) No results for input(s): BNP in the last 8760 hours. HbA1C: No results for input(s): HGBA1C in the last  72 hours. CBG: No results for input(s): GLUCAP in the last 168 hours. Lipid Profile: No results for input(s): CHOL, HDL, LDLCALC, TRIG, CHOLHDL, LDLDIRECT in the last 72 hours. Thyroid  Function Tests: No results for input(s): TSH, T4TOTAL, FREET4, T3FREE, THYROIDAB in the last 72 hours. Anemia Panel: No results for input(s): VITAMINB12, FOLATE, FERRITIN, TIBC, IRON , RETICCTPCT in the last 72 hours. Urine analysis:    Component Value Date/Time   COLORURINE YELLOW 12/03/2022 0825   APPEARANCEUR CLEAR 12/03/2022 0825   APPEARANCEUR Cloudy 03/28/2013 1640   LABSPEC 1.012 12/03/2022 0825   LABSPEC 1.021 03/28/2013 1640   PHURINE 7.0 12/03/2022 0825   GLUCOSEU NEGATIVE 12/03/2022 0825   GLUCOSEU Negative 03/28/2013 1640   HGBUR NEGATIVE 12/03/2022 0825   BILIRUBINUR NEGATIVE 12/03/2022 0825   BILIRUBINUR Negative 03/28/2013 1640   KETONESUR NEGATIVE 12/03/2022 0825   PROTEINUR NEGATIVE 12/03/2022 0825   UROBILINOGEN 0.2 12/18/2014 1704   NITRITE NEGATIVE 12/03/2022 0825   LEUKOCYTESUR NEGATIVE 12/03/2022 0825   LEUKOCYTESUR Trace 03/28/2013 1640    Radiological Exams on Admission: I have personally reviewed images DG Chest 2 View Result Date: 03/15/2024 CLINICAL DATA:  Shortness of breath and cough since last week. Chest pain. EXAM: CHEST - 2 VIEW COMPARISON:  12/02/2022 FINDINGS: Hyperinflation suggesting emphysema. No airspace disease or consolidation in the lungs. No pleural effusion or pneumothorax. Mediastinal contours appear intact. Calcification of the aorta. Degenerative changes in the shoulders. IMPRESSION: Mild emphysematous changes in the lungs. No evidence of active pulmonary disease. Electronically Signed   By: Elsie Gravely M.D.   On: 03/15/2024 16:40    EKG: My personal interpretation of EKG shows: Sinus rhythm at 89 bpm, no ST elevation    Assessment/Plan Principal Problem:   COPD exacerbation (HCC) Active Problems:    Chronic pain syndrome   Essential hypertension   Acute respiratory failure with hypoxia (HCC)   Mixed hyperlipidemia    Assessment and Plan: 63 year old female with history of COPD even though patient does not know the diagnosis, she had a history of COPD last year, HTN, HLD, chronic smoking, alcohol use who came into ED complaining of shortness of breath and cough for a week duration.  1.  Acute hypoxemic respiratory failure/COPD exacerbation - She will be admitted to the hospital as inpatient. - She does not have obvious pneumonia, COVID flu negative - She is very hypoxemic requiring BiPAP. - She was continued on BiPAP until this morning.  Will continue to wean off BiPAP and place her on nasal cannula oxygen and  we will try to wean off oxygen as well. - Continue nebulization - Patient received Solu-Medrol  in the emergency room 125 mg IV.  Will continue 40 mg IV Solu-Medrol  twice daily. - I will give her antibiotics ceftriaxone  for possible bronchitis. - She had a very bad cough.  Will give her cough medication as well  2.  HTN/HLD - Resume her home medications - Continue to monitor blood pressure  3.  Chronic pain syndrome - Will give her pain medications  4.  Hypothyroidism - Resume levothyroxine   5.  Chronic smoking/EtOH use - Extensive counseling was done, patient is stated that she stopped smoking since last week - Will give her Protonix  for GI prophylaxis in the setting of steroid use  6. CKD Stage 2 : Monitor renal function  DVT prophylaxis: Lovenox  Code Status: Full Code Family Communication: None  Disposition Plan: Home  Consults called: None  Admission status: Inpatient, Telemetry bed   Nena Rebel, MD [facility] 03/16/2024, 11:11 AM

## 2024-03-16 NOTE — ED Notes (Signed)
 BP cuff changed at this time to a smaller cuff. Pt SBP still reading in the 160's.

## 2024-03-16 NOTE — ED Notes (Signed)
 Pt fighting bipap upon assessment this morning. Pt trialed off of bipap at this time. O2 sat at 93% on RA and Pt RR labored.  applied at 3L and pt sating at 100%. Pt given breathing treatment. Pt currently in NAD.

## 2024-03-17 DIAGNOSIS — J441 Chronic obstructive pulmonary disease with (acute) exacerbation: Secondary | ICD-10-CM | POA: Diagnosis not present

## 2024-03-17 LAB — CBC
HCT: 44.6 % (ref 36.0–46.0)
Hemoglobin: 14.9 g/dL (ref 12.0–15.0)
MCH: 31.8 pg (ref 26.0–34.0)
MCHC: 33.4 g/dL (ref 30.0–36.0)
MCV: 95.3 fL (ref 80.0–100.0)
Platelets: 269 K/uL (ref 150–400)
RBC: 4.68 MIL/uL (ref 3.87–5.11)
RDW: 13.2 % (ref 11.5–15.5)
WBC: 16.4 K/uL — ABNORMAL HIGH (ref 4.0–10.5)
nRBC: 0 % (ref 0.0–0.2)

## 2024-03-17 LAB — COMPREHENSIVE METABOLIC PANEL WITH GFR
ALT: 9 U/L (ref 0–44)
AST: 15 U/L (ref 15–41)
Albumin: 3.8 g/dL (ref 3.5–5.0)
Alkaline Phosphatase: 93 U/L (ref 38–126)
Anion gap: 14 (ref 5–15)
BUN: 22 mg/dL (ref 8–23)
CO2: 22 mmol/L (ref 22–32)
Calcium: 10.1 mg/dL (ref 8.9–10.3)
Chloride: 106 mmol/L (ref 98–111)
Creatinine, Ser: 0.99 mg/dL (ref 0.44–1.00)
GFR, Estimated: >60 mL/min (ref >60)
Glucose, Bld: 134 mg/dL — ABNORMAL HIGH (ref 70–99)
Potassium: 4.7 mmol/L (ref 3.5–5.1)
Sodium: 142 mmol/L (ref 135–145)
Total Bilirubin: 0.4 mg/dL (ref 0.0–1.2)
Total Protein: 7.2 g/dL (ref 6.5–8.1)

## 2024-03-17 LAB — PROTIME-INR
INR: 1 (ref 0.8–1.2)
Prothrombin Time: 13.3 s (ref 11.4–15.2)

## 2024-03-17 MED ORDER — ALBUTEROL SULFATE (2.5 MG/3ML) 0.083% IN NEBU: 2.5000 mg | INHALATION_SOLUTION | RESPIRATORY_TRACT | Status: DC | PRN

## 2024-03-17 MED ORDER — HYDROCODONE-ACETAMINOPHEN 5-325 MG PO TABS
2.0000 | ORAL_TABLET | Freq: Three times a day (TID) | ORAL | Status: DC | PRN
Start: 2024-03-17 — End: 2024-03-17

## 2024-03-17 MED ORDER — HYDROCOD POLI-CHLORPHE POLI ER 10-8 MG/5ML PO SUER: 5.0000 mL | Freq: Every evening | ORAL | Status: DC | PRN

## 2024-03-17 MED ORDER — IPRATROPIUM-ALBUTEROL 0.5-2.5 (3) MG/3ML IN SOLN: 3.0000 mL | Freq: Four times a day (QID) | RESPIRATORY_TRACT | Status: DC

## 2024-03-17 MED ORDER — GUAIFENESIN ER 600 MG PO TB12: 600.0000 mg | ORAL_TABLET | Freq: Two times a day (BID) | ORAL | Status: DC

## 2024-03-17 MED ORDER — DICLOFENAC SODIUM 1 % EX GEL: 2.0000 g | Freq: Three times a day (TID) | CUTANEOUS | Status: DC | PRN

## 2024-03-17 MED ORDER — HYDRALAZINE HCL 20 MG/ML IJ SOLN: 5.0000 mg | Freq: Four times a day (QID) | INTRAMUSCULAR | Status: DC | PRN

## 2024-03-17 MED ORDER — ALBUTEROL SULFATE (2.5 MG/3ML) 0.083% IN NEBU: 2.5000 mg | INHALATION_SOLUTION | RESPIRATORY_TRACT | 2 refills | Status: AC | PRN

## 2024-03-17 MED ORDER — ONDANSETRON HCL 4 MG PO TABS: 4.0000 mg | ORAL_TABLET | Freq: Four times a day (QID) | ORAL | 0 refills | Status: DC | PRN

## 2024-03-17 MED ORDER — LIDOCAINE 5 % EX PTCH: 1.0000 | MEDICATED_PATCH | Freq: Every day | CUTANEOUS | Status: DC | PRN

## 2024-03-17 MED ORDER — PREDNISONE 20 MG PO TABS: 40.0000 mg | ORAL_TABLET | Freq: Every day | ORAL | 0 refills | Status: DC

## 2024-03-17 MED ORDER — MORPHINE SULFATE (PF) 2 MG/ML IV SOLN: 2.0000 mg | INTRAVENOUS | Status: DC | PRN

## 2024-03-17 MED ORDER — PANTOPRAZOLE SODIUM 40 MG PO TBEC
40.0000 mg | DELAYED_RELEASE_TABLET | Freq: Two times a day (BID) | ORAL | Status: DC
  Administered 2024-03-17: 40 mg via ORAL
  Filled 2024-03-17: qty 1

## 2024-03-17 MED ORDER — VITAMIN D 25 MCG (1000 UNIT) PO TABS: 10000.0000 [IU] | ORAL_TABLET | Freq: Every day | ORAL | Status: DC

## 2024-03-17 MED ORDER — LORAZEPAM 1 MG PO TABS: 1.0000 mg | ORAL_TABLET | ORAL | Status: DC | PRN

## 2024-03-17 NOTE — Assessment & Plan Note (Signed)
 Ativan  1 mg p.o. as needed for anxiety, 2 doses ordered with instruction to administer as appropriate and then let provider know. Patient does not appear to be in withdrawal.  CIWA precaution can be initiated as appropriate

## 2024-03-17 NOTE — ED Notes (Signed)
 Pt out of room and in lobby wandering around with monitoring cords and IV in place. Pt directed back to room. Primary RN made aware

## 2024-03-17 NOTE — Assessment & Plan Note (Signed)
 Home rosuvastatin  5 mg every evening

## 2024-03-17 NOTE — Assessment & Plan Note (Signed)
 PDMP reviewed.

## 2024-03-17 NOTE — Discharge Summary (Addendum)
 Physician Discharge Summary   Patient: [name] MRN: [id number] DOB: July 11, 1960  Admit date:     03/15/2024  Discharge date: 03/17/24  Discharge Physician: Greig LOISE Free   PCP: [facility]   Recommendations at discharge:   Take oral steroid to complete 4 additional days as prescribed. Albuterol  nebulizer as needed for shortness of breath and wheezing prescribed. Ondansetron  as needed for nausea and vomiting prescribed.  Discharge Diagnoses: Principal Problem:   COPD exacerbation (HCC) Active Problems:   Chronic pain syndrome   Essential hypertension   Acute respiratory failure with hypoxia (HCC)   Alcohol abuse   Chronic low back pain (1ry area of Pain) (Bilateral) w/o sciatica   Mixed hyperlipidemia  Resolved Problems:   * No resolved hospital problems. [facility] Course:  Mr. [name] is a 63 year old female with history of COPD, tobacco use, GERD, hyperlipidemia, chronic pain syndrome, alcohol use disorder, hypothyroid, who presents ED for chief concerns of cough and shortness of breath.  Current vitals show t of 98, rr 22, hr 100, blood pressure 138/76, SpO2 99% on 3 L nasal cannula.  HS troponin was 4.  COVID/influenza A/influenza B/RSV PCR were negative.  Labs on admission showed serum sodium 139, potassium 3.9, chloride 106, bicarb 23, nonfasting blood glucose 117, BUN of 17, serum creatinine 1.11.  WBC was 13.1, hemoglobin 15.2, platelets of 248.  03/15/2024: Patient presented to the ED.  Patient was treated for COPD exacerbation.  Solu-Medrol  125 mg IV one-time dose, DuoNebs were initiated.  03/16/2024: Patient was admitted for COPD exacerbation.  Solu-Medrol  40 mg IV twice daily, 2 doses were ordered with transition to oral steroids.  Scheduled DuoNebs initiated.  03/17/2024: First day of oral steroid.  Noted to have increased WBC to 16.4, suspect secondary to reactive.  Ceftriaxone  was discontinued. At bedside, patient states that she feels  much better.  She no longer has any more shortness of breath and her cough has much improved.  She would like to be discharged from the hospital at this time and go home.  She just needs albuterol  inhaler and nebulizer as needed and nausea medication as needed.  She wants a prescription for the steroid as this has helped with her wheezing and shortness of breath.  Furthermore, patient states that she drove here and that she can drive herself home at this time.  She just wants to be able to pick up the medication at [facility].  Assessment and Plan: * COPD exacerbation (HCC) Status post Solu-Medrol  125 mg IV on 03/15/2024 per EDP followed by Solu-Medrol  40 mg IV twice daily on 03/16/2024, 2 doses with transition to oral prednisone  for 4 more days Continue with DuoNebs every 6 hours for additional 1 more day Albuterol  2.5 mg nebulizer every 4 hours as needed for shortness of breath for 5 days Mucinex  6 mg p.o. twice daily, 2 more days ordered  Chronic pain syndrome PDMP reviewed Currently active prescription written and filled on 03/01/2024: Hydrocodone -acetaminophen  10-325 mg, 90 tablets, for 30-day supply; pregabalin 50 mg capsule, 60 capsules, 30-day supply  Acute respiratory failure with hypoxia (HCC) Secondary to COPD exacerbation, treatment per above  Essential hypertension Home amlodipine  10 mg daily resume Hydralazine  5 mg IV every 6 hours as needed for SBP greater 165, 5 days ordered  Mixed hyperlipidemia Home rosuvastatin  5 mg every evening  Chronic low back pain (1ry area of Pain) (Bilateral) w/o sciatica PDMP reviewed  Alcohol abuse Ativan  1 mg p.o. as needed for anxiety,  2 doses ordered with instruction to administer as appropriate and then let provider know. Patient does not appear to be in withdrawal.  CIWA precaution can be initiated as appropriate  Pain control - [place]  Controlled Substance Reporting System database was reviewed. and patient was instructed, not to drive,  operate heavy machinery, perform activities at heights, swimming or participation in water activities or provide baby-sitting services while on Pain, Sleep and Anxiety Medications; until their outpatient Physician has advised to do so again. Also recommended to not to take more than prescribed Pain, Sleep and Anxiety Medications.   Consultants: None at this time Procedures performed: None Disposition: Home Diet recommendation:  Cardiac diet DISCHARGE MEDICATION: Allergies as of 03/17/2024   No Known Allergies      Medication List     STOP taking these medications    amLODipine  10 MG tablet Commonly known as: NORVASC    cyclobenzaprine  10 MG tablet Commonly known as: FLEXERIL        TAKE these medications    albuterol  108 (90 Base) MCG/ACT inhaler Commonly known as: VENTOLIN  HFA Inhale 2 puffs into the lungs every 6 (six) hours as needed for wheezing or shortness of breath. What changed: Another medication with the same name was added. Make sure you understand how and when to take each.   albuterol  (2.5 MG/3ML) 0.083% nebulizer solution Commonly known as: PROVENTIL  Take 3 mLs (2.5 mg total) by nebulization every 4 (four) hours as needed for shortness of breath. What changed: You were already taking a medication with the same name, and this prescription was added. Make sure you understand how and when to take each.   Biotin 5 MG Tabs Take 1 tablet by mouth daily.   Cholecalciferol  125 MCG (5000 UT) Tabs Take 10,000 Units by mouth daily.   diclofenac  Sodium 1 % Gel Commonly known as: VOLTAREN  Apply 2 g topically 3 (three) times daily as needed (to thumb for pain).   ferrous sulfate  325 (65 FE) MG tablet Take 1 tablet (325 mg total) by mouth 2 (two) times daily with a meal.   folic acid  1 MG tablet Commonly known as: FOLVITE  Take 1 tablet (1 mg total) by mouth daily.   HYDROcodone -acetaminophen  5-325 MG tablet Commonly known as: NORCO/VICODIN Take 2 tablets by  mouth 3 (three) times daily as needed.   levothyroxine  88 MCG tablet Commonly known as: SYNTHROID  Take 88 mcg by mouth daily.   naloxone 4 MG/0.1ML Liqd nasal spray kit Commonly known as: NARCAN Place 1 spray into the nose once.   ondansetron  4 MG tablet Commonly known as: ZOFRAN  Take 1 tablet (4 mg total) by mouth every 6 (six) hours as needed for nausea or vomiting.   pantoprazole  40 MG tablet Commonly known as: Protonix  Take 1 tablet (40 mg total) by mouth 2 (two) times daily.   predniSONE  20 MG tablet Commonly known as: DELTASONE  Take 2 tablets (40 mg total) by mouth daily with breakfast. Start taking on: March 18, 2024   rosuvastatin  5 MG tablet Commonly known as: Crestor  Take 1 tablet (5 mg total) by mouth every evening. For cholesterol.   traZODone  50 MG tablet Commonly known as: DESYREL  Take 50-100 mg by mouth at bedtime.               Durable Medical Equipment  (From admission, onward)           Start     Ordered   03/17/24 0000  For home use only DME Nebulizer machine  Question Answer Comment  Patient needs a nebulizer to treat with the following condition COPD exacerbation (HCC)   Length of Need 6 Months   Additional equipment included Administration kit   Additional equipment included Filter      03/17/24 1209           Discharge Exam: Filed Weights   03/15/24 1518  Weight: 50.3 kg   Atraumatic, no acute distress.  PERRLA.  Regular rate and rhythm.  Negative for wheezing and no increased use of assessor muscles.  Extremities 5 out of 5 strength with normal range of motion.  Condition at discharge: good  The results of significant diagnostics from this hospitalization (including imaging, microbiology, ancillary and laboratory) are listed below for reference.   Imaging Studies: DG Chest 2 View Result Date: 03/15/2024 CLINICAL DATA:  Shortness of breath and cough since last week. Chest pain. EXAM: CHEST - 2 VIEW COMPARISON:   12/02/2022 FINDINGS: Hyperinflation suggesting emphysema. No airspace disease or consolidation in the lungs. No pleural effusion or pneumothorax. Mediastinal contours appear intact. Calcification of the aorta. Degenerative changes in the shoulders. IMPRESSION: Mild emphysematous changes in the lungs. No evidence of active pulmonary disease. Electronically Signed   By: Elsie Gravely M.D.   On: 03/15/2024 16:40   Microbiology: Results for orders placed or performed during the hospital encounter of 03/15/24  Resp panel by RT-PCR (RSV, Flu A&B, Covid) Anterior Nasal Swab     Status: None   Collection Time: 03/16/24  8:43 AM   Specimen: Anterior Nasal Swab  Result Value Ref Range Status   SARS Coronavirus 2 by RT PCR NEGATIVE NEGATIVE Final    Comment: (NOTE) SARS-CoV-2 target nucleic acids are NOT DETECTED.  The SARS-CoV-2 RNA is generally detectable in upper respiratory specimens during the acute phase of infection. The lowest concentration of SARS-CoV-2 viral copies this assay can detect is 138 copies/mL. A negative result does not preclude SARS-Cov-2 infection and should not be used as the sole basis for treatment or other patient management decisions. A negative result may occur with  improper specimen collection/handling, submission of specimen other than nasopharyngeal swab, presence of viral mutation(s) within the areas targeted by this assay, and inadequate number of viral copies(<138 copies/mL). A negative result must be combined with clinical observations, patient history, and epidemiological information. The expected result is Negative.  Fact Sheet for Patients:  BloggerCourse.com  Fact Sheet for Healthcare Providers:  SeriousBroker.it  This test is no t yet approved or cleared by the [place]  FDA and  has been authorized for detection and/or diagnosis of SARS-CoV-2 by FDA under an Emergency Use Authorization (EUA). This  EUA will remain  in effect (meaning this test can be used) for the duration of the COVID-19 declaration under Section 564(b)(1) of the Act, 21 U.S.C.section 360bbb-3(b)(1), unless the authorization is terminated  or revoked sooner.       Influenza A by PCR NEGATIVE NEGATIVE Final   Influenza B by PCR NEGATIVE NEGATIVE Final    Comment: (NOTE) The Xpert Xpress SARS-CoV-2/FLU/RSV plus assay is intended as an aid in the diagnosis of influenza from Nasopharyngeal swab specimens and should not be used as a sole basis for treatment. Nasal washings and aspirates are unacceptable for Xpert Xpress SARS-CoV-2/FLU/RSV testing.  Fact Sheet for Patients: BloggerCourse.com  Fact Sheet for Healthcare Providers: SeriousBroker.it  This test is not yet approved or cleared by the [place]  FDA and has been authorized for detection and/or diagnosis of SARS-CoV-2 by FDA under an  Emergency Use Authorization (EUA). This EUA will remain in effect (meaning this test can be used) for the duration of the COVID-19 declaration under Section 564(b)(1) of the Act, 21 U.S.C. section 360bbb-3(b)(1), unless the authorization is terminated or revoked.     Resp Syncytial Virus by PCR NEGATIVE NEGATIVE Final    Comment: (NOTE) Fact Sheet for Patients: BloggerCourse.com  Fact Sheet for Healthcare Providers: SeriousBroker.it  This test is not yet approved or cleared by the [place]  FDA and has been authorized for detection and/or diagnosis of SARS-CoV-2 by FDA under an Emergency Use Authorization (EUA). This EUA will remain in effect (meaning this test can be used) for the duration of the COVID-19 declaration under Section 564(b)(1) of the Act, 21 U.S.C. section 360bbb-3(b)(1), unless the authorization is terminated or revoked.  Performed at [facility], [address] [place], [place]  [address]     Labs: CBC: Recent Labs  Lab 03/15/24 1519 03/16/24 1812 03/17/24 0451  WBC 7.3 13.1* 16.4*  HGB 13.9 15.2* 14.9  HCT 41.1 44.8 44.6  MCV 94.9 93.9 95.3  PLT 233 248 269   Basic Metabolic Panel: Recent Labs  Lab 03/15/24 1519 03/16/24 1812 03/17/24 0451  NA 139  --  142  K 3.9  --  4.7  CL 106  --  106  CO2 23  --  22  GLUCOSE 117*  --  134*  BUN 17  --  22  CREATININE 1.11* 0.89 0.99  CALCIUM  9.7  --  10.1   Liver Function Tests: Recent Labs  Lab 03/17/24 0451  AST 15  ALT 9  ALKPHOS 93  BILITOT 0.4  PROT 7.2  ALBUMIN 3.8   CBG: No results for input(s): GLUCAP in the last 168 hours.  Discharge time spent: less than 30 minutes.  Signed:  Dr. Sherre [facility] 03/17/2024

## 2024-03-17 NOTE — Assessment & Plan Note (Signed)
 Status post Solu-Medrol  125 mg IV on 03/15/2024 per EDP followed by Solu-Medrol  40 mg IV twice daily on 03/16/2024, 2 doses with transition to oral prednisone  for 4 more days Continue with DuoNebs every 6 hours for additional 1 more day Albuterol  2.5 mg nebulizer every 4 hours as needed for shortness of breath for 5 days Mucinex  6 mg p.o. twice daily, 2 more days ordered

## 2024-03-17 NOTE — Assessment & Plan Note (Signed)
 Home amlodipine  10 mg daily resume Hydralazine  5 mg IV every 6 hours as needed for SBP greater 165, 5 days ordered

## 2024-03-17 NOTE — ED Notes (Signed)
 Pt out of room and in lobby again wandering and attempting to exit lobby. This RN explaining pt cannot leave with IV in her arm. Pt stating, It's not connected to anything. This RN further stating that having an IV in place needs to be monitored in hospital room and pt cannot go to car with it in. Primary medic called and made aware. EDT came to get pt out of lobby.

## 2024-03-17 NOTE — Assessment & Plan Note (Signed)
 PDMP reviewed Currently active prescription written and filled on 03/01/2024: Hydrocodone -acetaminophen  10-325 mg, 90 tablets, for 30-day supply; pregabalin 50 mg capsule, 60 capsules, 30-day supply

## 2024-03-17 NOTE — Assessment & Plan Note (Signed)
 Secondary to COPD exacerbation, treatment per above

## 2024-03-17 NOTE — Hospital Course (Addendum)
 Mr. [name] is a 63 year old female with history of COPD, tobacco use, GERD, hyperlipidemia, chronic pain syndrome, alcohol use disorder, hypothyroid, who presents ED for chief concerns of cough and shortness of breath.  Current vitals show t of 98, rr 22, hr 100, blood pressure 138/76, SpO2 99% on 3 L nasal cannula.  HS troponin was 4.  COVID/influenza A/influenza B/RSV PCR were negative.  Labs on admission showed serum sodium 139, potassium 3.9, chloride 106, bicarb 23, nonfasting blood glucose 117, BUN of 17, serum creatinine 1.11.  WBC was 13.1, hemoglobin 15.2, platelets of 248.  03/15/2024: Patient presented to the ED.  Patient was treated for COPD exacerbation.  Solu-Medrol  125 mg IV one-time dose, DuoNebs were initiated.  03/16/2024: Patient was admitted for COPD exacerbation.  Solu-Medrol  40 mg IV twice daily, 2 doses were ordered with transition to oral steroids.  Scheduled DuoNebs initiated.  03/17/2024: First day of oral steroid.  Noted to have increased WBC to 16.4, suspect secondary to reactive.  Ceftriaxone  was discontinued.

## 2024-03-19 LAB — MISC LABCORP TEST (SEND OUT): Labcorp test code: 83935

## 2024-05-28 ENCOUNTER — Emergency Department (HOSPITAL_COMMUNITY)

## 2024-05-28 ENCOUNTER — Encounter (HOSPITAL_COMMUNITY): Payer: Self-pay | Admitting: *Deleted

## 2024-05-28 ENCOUNTER — Other Ambulatory Visit: Payer: Self-pay

## 2024-05-28 ENCOUNTER — Inpatient Hospital Stay (HOSPITAL_COMMUNITY)
Admission: EM | Admit: 2024-05-28 | Discharge: 2024-06-02 | Disposition: A | Discharge status: Home / Self Care | Attending: Hospitalist | Admitting: Hospitalist

## 2024-05-28 DIAGNOSIS — F1020 Alcohol dependence, uncomplicated: Secondary | ICD-10-CM | POA: Diagnosis present

## 2024-05-28 DIAGNOSIS — F1010 Alcohol abuse, uncomplicated: Secondary | ICD-10-CM | POA: Diagnosis present

## 2024-05-28 DIAGNOSIS — I6529 Occlusion and stenosis of unspecified carotid artery: Secondary | ICD-10-CM | POA: Diagnosis present

## 2024-05-28 DIAGNOSIS — R29716 NIHSS score 16: Secondary | ICD-10-CM | POA: Diagnosis not present

## 2024-05-28 DIAGNOSIS — G935 Compression of brain: Secondary | ICD-10-CM | POA: Diagnosis present

## 2024-05-28 DIAGNOSIS — Z808 Family history of malignant neoplasm of other organs or systems: Secondary | ICD-10-CM

## 2024-05-28 DIAGNOSIS — G928 Other toxic encephalopathy: Secondary | ICD-10-CM | POA: Diagnosis present

## 2024-05-28 DIAGNOSIS — Z1152 Encounter for screening for COVID-19: Secondary | ICD-10-CM

## 2024-05-28 DIAGNOSIS — R000 Tachycardia, unspecified: Secondary | ICD-10-CM | POA: Diagnosis not present

## 2024-05-28 DIAGNOSIS — Z79899 Other long term (current) drug therapy: Secondary | ICD-10-CM

## 2024-05-28 DIAGNOSIS — R29704 NIHSS score 4: Secondary | ICD-10-CM | POA: Diagnosis not present

## 2024-05-28 DIAGNOSIS — A419 Sepsis, unspecified organism: Secondary | ICD-10-CM | POA: Diagnosis present

## 2024-05-28 DIAGNOSIS — R571 Hypovolemic shock: Secondary | ICD-10-CM | POA: Diagnosis present

## 2024-05-28 DIAGNOSIS — E871 Hypo-osmolality and hyponatremia: Secondary | ICD-10-CM | POA: Diagnosis present

## 2024-05-28 DIAGNOSIS — G9341 Metabolic encephalopathy: Secondary | ICD-10-CM | POA: Diagnosis present

## 2024-05-28 DIAGNOSIS — R6521 Severe sepsis with septic shock: Secondary | ICD-10-CM | POA: Diagnosis present

## 2024-05-28 DIAGNOSIS — F17210 Nicotine dependence, cigarettes, uncomplicated: Secondary | ICD-10-CM | POA: Diagnosis not present

## 2024-05-28 DIAGNOSIS — Z90710 Acquired absence of both cervix and uterus: Secondary | ICD-10-CM

## 2024-05-28 DIAGNOSIS — Z8419 Family history of other disorders of kidney and ureter: Secondary | ICD-10-CM

## 2024-05-28 DIAGNOSIS — J9601 Acute respiratory failure with hypoxia: Secondary | ICD-10-CM | POA: Diagnosis not present

## 2024-05-28 DIAGNOSIS — N1831 Chronic kidney disease, stage 3a: Secondary | ICD-10-CM | POA: Diagnosis present

## 2024-05-28 DIAGNOSIS — I6389 Other cerebral infarction: Secondary | ICD-10-CM | POA: Diagnosis not present

## 2024-05-28 DIAGNOSIS — R451 Restlessness and agitation: Secondary | ICD-10-CM | POA: Diagnosis present

## 2024-05-28 DIAGNOSIS — R509 Fever, unspecified: Secondary | ICD-10-CM | POA: Diagnosis present

## 2024-05-28 DIAGNOSIS — G0400 Acute disseminated encephalitis and encephalomyelitis, unspecified: Secondary | ICD-10-CM | POA: Diagnosis not present

## 2024-05-28 DIAGNOSIS — J449 Chronic obstructive pulmonary disease, unspecified: Secondary | ICD-10-CM | POA: Diagnosis present

## 2024-05-28 DIAGNOSIS — I6783 Posterior reversible encephalopathy syndrome: Secondary | ICD-10-CM | POA: Diagnosis present

## 2024-05-28 DIAGNOSIS — N179 Acute kidney failure, unspecified: Secondary | ICD-10-CM | POA: Diagnosis present

## 2024-05-28 DIAGNOSIS — Z8711 Personal history of peptic ulcer disease: Secondary | ICD-10-CM

## 2024-05-28 DIAGNOSIS — R930 Abnormal findings on diagnostic imaging of skull and head, not elsewhere classified: Secondary | ICD-10-CM

## 2024-05-28 DIAGNOSIS — I161 Hypertensive emergency: Secondary | ICD-10-CM | POA: Diagnosis present

## 2024-05-28 DIAGNOSIS — J9600 Acute respiratory failure, unspecified whether with hypoxia or hypercapnia: Secondary | ICD-10-CM | POA: Diagnosis present

## 2024-05-28 DIAGNOSIS — E860 Dehydration: Secondary | ICD-10-CM | POA: Diagnosis present

## 2024-05-28 DIAGNOSIS — Z789 Other specified health status: Secondary | ICD-10-CM

## 2024-05-28 DIAGNOSIS — R6520 Severe sepsis without septic shock: Principal | ICD-10-CM

## 2024-05-28 DIAGNOSIS — Z803 Family history of malignant neoplasm of breast: Secondary | ICD-10-CM

## 2024-05-28 DIAGNOSIS — E861 Hypovolemia: Secondary | ICD-10-CM | POA: Diagnosis present

## 2024-05-28 DIAGNOSIS — F1990 Other psychoactive substance use, unspecified, uncomplicated: Secondary | ICD-10-CM | POA: Diagnosis present

## 2024-05-28 DIAGNOSIS — E876 Hypokalemia: Secondary | ICD-10-CM | POA: Diagnosis present

## 2024-05-28 DIAGNOSIS — R579 Shock, unspecified: Secondary | ICD-10-CM | POA: Diagnosis not present

## 2024-05-28 DIAGNOSIS — Z800 Family history of malignant neoplasm of digestive organs: Secondary | ICD-10-CM

## 2024-05-28 DIAGNOSIS — R29717 NIHSS score 17: Secondary | ICD-10-CM | POA: Diagnosis not present

## 2024-05-28 DIAGNOSIS — F1510 Other stimulant abuse, uncomplicated: Secondary | ICD-10-CM

## 2024-05-28 DIAGNOSIS — G9340 Encephalopathy, unspecified: Secondary | ICD-10-CM

## 2024-05-28 DIAGNOSIS — R4182 Altered mental status, unspecified: Secondary | ICD-10-CM | POA: Diagnosis not present

## 2024-05-28 DIAGNOSIS — T43641A Poisoning by ecstasy, accidental (unintentional), initial encounter: Secondary | ICD-10-CM | POA: Diagnosis present

## 2024-05-28 DIAGNOSIS — E782 Mixed hyperlipidemia: Secondary | ICD-10-CM | POA: Diagnosis present

## 2024-05-28 DIAGNOSIS — E039 Hypothyroidism, unspecified: Secondary | ICD-10-CM | POA: Diagnosis present

## 2024-05-28 DIAGNOSIS — G936 Cerebral edema: Secondary | ICD-10-CM | POA: Diagnosis present

## 2024-05-28 DIAGNOSIS — Z79891 Long term (current) use of opiate analgesic: Secondary | ICD-10-CM

## 2024-05-28 DIAGNOSIS — Z79890 Hormone replacement therapy (postmenopausal): Secondary | ICD-10-CM

## 2024-05-28 DIAGNOSIS — E872 Acidosis, unspecified: Secondary | ICD-10-CM | POA: Diagnosis present

## 2024-05-28 DIAGNOSIS — R29702 NIHSS score 2: Secondary | ICD-10-CM | POA: Diagnosis not present

## 2024-05-28 DIAGNOSIS — I10 Essential (primary) hypertension: Secondary | ICD-10-CM | POA: Diagnosis not present

## 2024-05-28 DIAGNOSIS — F1590 Other stimulant use, unspecified, uncomplicated: Secondary | ICD-10-CM | POA: Diagnosis present

## 2024-05-28 DIAGNOSIS — H5704 Mydriasis: Secondary | ICD-10-CM | POA: Diagnosis present

## 2024-05-28 DIAGNOSIS — Z7982 Long term (current) use of aspirin: Secondary | ICD-10-CM

## 2024-05-28 DIAGNOSIS — R569 Unspecified convulsions: Secondary | ICD-10-CM | POA: Diagnosis not present

## 2024-05-28 DIAGNOSIS — I129 Hypertensive chronic kidney disease with stage 1 through stage 4 chronic kidney disease, or unspecified chronic kidney disease: Secondary | ICD-10-CM | POA: Diagnosis present

## 2024-05-28 DIAGNOSIS — Z720 Tobacco use: Secondary | ICD-10-CM | POA: Diagnosis not present

## 2024-05-28 DIAGNOSIS — G894 Chronic pain syndrome: Secondary | ICD-10-CM | POA: Diagnosis present

## 2024-05-28 LAB — RESP PANEL BY RT-PCR (RSV, FLU A&B, COVID)  RVPGX2
Influenza A by PCR: NEGATIVE
Influenza B by PCR: NEGATIVE
Resp Syncytial Virus by PCR: NEGATIVE
SARS Coronavirus 2 by RT PCR: NEGATIVE

## 2024-05-28 LAB — URINE DRUG SCREEN
Amphetamines: POSITIVE — AB
Barbiturates: NEGATIVE
Benzodiazepines: POSITIVE — AB
Cocaine: NEGATIVE
Fentanyl: NEGATIVE
Methadone Scn, Ur: NEGATIVE
Opiates: NEGATIVE
Tetrahydrocannabinol: NEGATIVE

## 2024-05-28 LAB — COMPREHENSIVE METABOLIC PANEL WITH GFR
ALT: 7 U/L (ref 0–44)
AST: 33 U/L (ref 15–41)
Albumin: 4.6 g/dL (ref 3.5–5.0)
Alkaline Phosphatase: 136 U/L — ABNORMAL HIGH (ref 38–126)
Anion gap: 22 — ABNORMAL HIGH (ref 5–15)
BUN: 16 mg/dL (ref 8–23)
CO2: 18 mmol/L — ABNORMAL LOW (ref 22–32)
Calcium: 10.6 mg/dL — ABNORMAL HIGH (ref 8.9–10.3)
Chloride: 98 mmol/L (ref 98–111)
Creatinine, Ser: 0.97 mg/dL (ref 0.44–1.00)
GFR, Estimated: >60 mL/min
Glucose, Bld: 155 mg/dL — ABNORMAL HIGH (ref 70–99)
Potassium: 4 mmol/L (ref 3.5–5.1)
Sodium: 138 mmol/L (ref 135–145)
Total Bilirubin: 0.5 mg/dL (ref 0.0–1.2)
Total Protein: 7.9 g/dL (ref 6.5–8.1)

## 2024-05-28 LAB — URINALYSIS, MICROSCOPIC (REFLEX): Bacteria, UA: NONE SEEN

## 2024-05-28 LAB — URINALYSIS, ROUTINE W REFLEX MICROSCOPIC
Bilirubin Urine: NEGATIVE
Glucose, UA: NEGATIVE mg/dL
Ketones, ur: NEGATIVE mg/dL
Leukocytes,Ua: NEGATIVE
Nitrite: NEGATIVE
Protein, ur: >300 mg/dL — AB
Specific Gravity, Urine: 1.02 (ref 1.005–1.030)
pH: 6.5 (ref 5.0–8.0)

## 2024-05-28 LAB — CBC WITH DIFFERENTIAL/PLATELET
Abs Immature Granulocytes: 0.08 K/uL — ABNORMAL HIGH (ref 0.00–0.07)
Basophils Absolute: 0 K/uL (ref 0.0–0.1)
Basophils Relative: 0 %
Eosinophils Absolute: 0 K/uL (ref 0.0–0.5)
Eosinophils Relative: 0 %
HCT: 52.1 % — ABNORMAL HIGH (ref 36.0–46.0)
Hemoglobin: 18 g/dL — ABNORMAL HIGH (ref 12.0–15.0)
Immature Granulocytes: 1 %
Lymphocytes Relative: 5 %
Lymphs Abs: 0.8 K/uL (ref 0.7–4.0)
MCH: 32.1 pg (ref 26.0–34.0)
MCHC: 34.5 g/dL (ref 30.0–36.0)
MCV: 93 fL (ref 80.0–100.0)
Monocytes Absolute: 0.8 K/uL (ref 0.1–1.0)
Monocytes Relative: 5 %
Neutro Abs: 14.2 K/uL — ABNORMAL HIGH (ref 1.7–7.7)
Neutrophils Relative %: 89 %
Platelets: 327 K/uL (ref 150–400)
RBC: 5.6 MIL/uL — ABNORMAL HIGH (ref 3.87–5.11)
RDW: 13.7 % (ref 11.5–15.5)
WBC: 15.9 K/uL — ABNORMAL HIGH (ref 4.0–10.5)
nRBC: 0 % (ref 0.0–0.2)

## 2024-05-28 LAB — CBG MONITORING, ED: Glucose-Capillary: 159 mg/dL — ABNORMAL HIGH (ref 70–99)

## 2024-05-28 LAB — I-STAT CG4 LACTIC ACID, ED
Lactic Acid, Venous: 4.1 mmol/L (ref 0.5–1.9)
Lactic Acid, Venous: 2.8 mmol/L (ref 0.5–1.9)

## 2024-05-28 LAB — TSH: TSH: 1.05 u[IU]/mL (ref 0.350–4.500)

## 2024-05-28 LAB — CK: Total CK: 141 U/L (ref 38–234)

## 2024-05-28 LAB — ETHANOL: Alcohol, Ethyl (B): <15 mg/dL

## 2024-05-28 LAB — T4, FREE: Free T4: 1.59 ng/dL (ref 0.80–2.00)

## 2024-05-28 LAB — HIV ANTIBODY (ROUTINE TESTING W REFLEX): HIV Screen 4th Generation wRfx: NONREACTIVE

## 2024-05-28 LAB — LACTIC ACID, PLASMA: Lactic Acid, Venous: 3 mmol/L (ref 0.5–1.9)

## 2024-05-28 MED ORDER — LACTATED RINGERS IV SOLN: INTRAVENOUS | Status: DC

## 2024-05-28 MED ORDER — DOCUSATE SODIUM 100 MG PO CAPS: 100.0000 mg | ORAL_CAPSULE | Freq: Two times a day (BID) | ORAL | Status: DC | PRN

## 2024-05-28 MED ORDER — LORAZEPAM 2 MG/ML IJ SOLN: 1.0000 mg | INTRAMUSCULAR | Status: DC | PRN

## 2024-05-28 MED ORDER — SODIUM CHLORIDE 0.9 % IV SOLN: INTRAVENOUS | Status: DC

## 2024-05-28 MED ORDER — SODIUM CHLORIDE 0.9 % IV SOLN
2.0000 g | Freq: Four times a day (QID) | INTRAVENOUS | Status: DC
  Administered 2024-05-28 – 2024-05-30 (×6): 2 g via INTRAVENOUS
  Filled 2024-05-28 (×8): qty 2000

## 2024-05-28 MED ORDER — CHLORHEXIDINE GLUCONATE CLOTH 2 % EX PADS
6.0000 | MEDICATED_PAD | Freq: Every day | CUTANEOUS | Status: DC
  Administered 2024-05-28 – 2024-06-02 (×7): 6 via TOPICAL

## 2024-05-28 MED ORDER — LACTATED RINGERS IV BOLUS
1000.0000 mL | Freq: Once | INTRAVENOUS | Status: AC
  Administered 2024-05-28: 1000 mL via INTRAVENOUS

## 2024-05-28 MED ORDER — HYDRALAZINE HCL 20 MG/ML IJ SOLN
0.0000 mg | INTRAMUSCULAR | Status: DC | PRN
  Administered 2024-05-28 – 2024-05-29 (×2): 20 mg via INTRAVENOUS
  Filled 2024-05-28 (×2): qty 1

## 2024-05-28 MED ORDER — NICOTINE 14 MG/24HR TD PT24
14.0000 mg | MEDICATED_PATCH | Freq: Every day | TRANSDERMAL | Status: DC
  Administered 2024-05-28 – 2024-06-02 (×6): 14 mg via TRANSDERMAL
  Filled 2024-05-28 (×6): qty 1

## 2024-05-28 MED ORDER — VANCOMYCIN HCL IN DEXTROSE 1-5 GM/200ML-% IV SOLN: 1000.0000 mg | Freq: Once | INTRAVENOUS | Status: DC

## 2024-05-28 MED ORDER — LORAZEPAM 2 MG/ML IJ SOLN
1.0000 mg | INTRAMUSCULAR | Status: AC | PRN
  Administered 2024-05-28 – 2024-05-29 (×3): 1 mg via INTRAVENOUS
  Filled 2024-05-28 (×3): qty 1

## 2024-05-28 MED ORDER — VANCOMYCIN HCL 500 MG/100ML IV SOLN
500.0000 mg | Freq: Two times a day (BID) | INTRAVENOUS | Status: DC
  Administered 2024-05-28 – 2024-05-30 (×4): 500 mg via INTRAVENOUS
  Filled 2024-05-28 (×4): qty 100

## 2024-05-28 MED ORDER — DEXTROSE 5 % IV SOLN
500.0000 mg | Freq: Two times a day (BID) | INTRAVENOUS | Status: DC
  Administered 2024-05-28 – 2024-05-29 (×3): 500 mg via INTRAVENOUS
  Filled 2024-05-28 (×5): qty 10

## 2024-05-28 MED ORDER — VANCOMYCIN HCL 1250 MG/250ML IV SOLN
1250.0000 mg | Freq: Once | INTRAVENOUS | Status: AC
  Administered 2024-05-28: 1250 mg via INTRAVENOUS
  Filled 2024-05-28: qty 250

## 2024-05-28 MED ORDER — KETOROLAC TROMETHAMINE 30 MG/ML IJ SOLN
15.0000 mg | Freq: Once | INTRAMUSCULAR | Status: AC
  Administered 2024-05-28: 15 mg via INTRAVENOUS
  Filled 2024-05-28: qty 1

## 2024-05-28 MED ORDER — ACETAMINOPHEN 650 MG RE SUPP
650.0000 mg | Freq: Once | RECTAL | Status: AC
  Administered 2024-05-28: 650 mg via RECTAL
  Filled 2024-05-28: qty 1

## 2024-05-28 MED ORDER — DEXTROSE 5 % IV SOLN
10.0000 mg/kg | Freq: Two times a day (BID) | INTRAVENOUS | Status: DC
  Filled 2024-05-28 (×6): qty 10.1

## 2024-05-28 MED ORDER — HALOPERIDOL LACTATE 5 MG/ML IJ SOLN: 5.0000 mg | Freq: Once | INTRAMUSCULAR | Status: DC

## 2024-05-28 MED ORDER — VANCOMYCIN HCL 1750 MG/350ML IV SOLN
1750.0000 mg | Freq: Once | INTRAVENOUS | Status: DC
  Filled 2024-05-28: qty 350

## 2024-05-28 MED ORDER — THIAMINE HCL 100 MG/ML IJ SOLN
100.0000 mg | Freq: Every day | INTRAMUSCULAR | Status: DC
  Administered 2024-05-28: 100 mg via INTRAVENOUS
  Filled 2024-05-28: qty 2

## 2024-05-28 MED ORDER — SODIUM CHLORIDE 0.9 % IV SOLN
2.0000 g | Freq: Two times a day (BID) | INTRAVENOUS | Status: DC
  Administered 2024-05-28 – 2024-05-30 (×4): 2 g via INTRAVENOUS
  Filled 2024-05-28 (×4): qty 20

## 2024-05-28 MED ORDER — SODIUM CHLORIDE 0.9 % IV BOLUS
1000.0000 mL | Freq: Once | INTRAVENOUS | Status: AC
  Administered 2024-05-28: 1000 mL via INTRAVENOUS

## 2024-05-28 MED ORDER — METRONIDAZOLE 500 MG/100ML IV SOLN
500.0000 mg | Freq: Once | INTRAVENOUS | Status: AC
  Administered 2024-05-28: 500 mg via INTRAVENOUS
  Filled 2024-05-28: qty 100

## 2024-05-28 MED ORDER — POLYETHYLENE GLYCOL 3350 17 G PO PACK: 17.0000 g | PACK | Freq: Every day | ORAL | Status: DC | PRN

## 2024-05-28 MED ORDER — LACTATED RINGERS IV BOLUS (SEPSIS): 500.0000 mL | Freq: Once | INTRAVENOUS | Status: DC

## 2024-05-28 MED ORDER — SODIUM CHLORIDE 0.9 % IV SOLN
2.0000 g | Freq: Once | INTRAVENOUS | Status: AC
  Administered 2024-05-28: 2 g via INTRAVENOUS
  Filled 2024-05-28: qty 12.5

## 2024-05-28 MED ORDER — FOLIC ACID 5 MG/ML IJ SOLN
1.0000 mg | Freq: Every day | INTRAMUSCULAR | Status: DC
  Administered 2024-05-28: 1 mg via INTRAVENOUS
  Filled 2024-05-28 (×3): qty 0.2

## 2024-05-28 MED ORDER — LACTATED RINGERS IV BOLUS (SEPSIS)
1000.0000 mL | Freq: Once | INTRAVENOUS | Status: AC
  Administered 2024-05-28: 1000 mL via INTRAVENOUS

## 2024-05-28 MED ORDER — LACTATED RINGERS IV BOLUS (SEPSIS): 250.0000 mL | Freq: Once | INTRAVENOUS | Status: DC

## 2024-05-28 MED ORDER — LABETALOL HCL 5 MG/ML IV SOLN
20.0000 mg | Freq: Once | INTRAVENOUS | Status: AC
  Administered 2024-05-28: 20 mg via INTRAVENOUS
  Filled 2024-05-28: qty 4

## 2024-05-28 MED ORDER — HYDRALAZINE HCL 20 MG/ML IJ SOLN
20.0000 mg | Freq: Once | INTRAMUSCULAR | Status: AC
  Administered 2024-05-28: 20 mg via INTRAVENOUS
  Filled 2024-05-28: qty 1

## 2024-05-28 MED ORDER — LORAZEPAM 2 MG/ML IJ SOLN
1.0000 mg | Freq: Once | INTRAMUSCULAR | Status: DC
  Administered 2024-05-28: 1 mg via INTRAVENOUS
  Filled 2024-05-28: qty 1

## 2024-05-28 MED ORDER — METOPROLOL TARTRATE 5 MG/5ML IV SOLN: 5.0000 mg | INTRAVENOUS | Status: DC | PRN

## 2024-05-28 MED ORDER — DIPHENHYDRAMINE HCL 50 MG/ML IJ SOLN
25.0000 mg | Freq: Once | INTRAMUSCULAR | Status: AC
  Administered 2024-05-28: 25 mg via INTRAVENOUS
  Filled 2024-05-28: qty 1

## 2024-05-28 NOTE — ED Notes (Signed)
 Only able to get one set of cultures and labs. PT became a bit combative. RN Burnard HERO. aware.

## 2024-05-28 NOTE — H&P (Signed)
 "  NAME:  [name], MRN:  [id number], DOB:  28-Oct-1960, LOS: 0 ADMISSION DATE:  05/28/2024, CONSULTATION DATE:  05/28/24 REFERRING MD:  Garrick CHIEF COMPLAINT:  AMS   History of Present Illness:  Pt is encephelopathic; therefore, this HPI is obtained from chart review. [name] is a 63 y.o. female who has a PMH as below. She presented to [facility] ED 05/28/24 via EMS for AMS, headaches, gait disturbance 2 days prior. Fiance noted AMS the night prior which did not resolve by day of presentation.  She came to ED where she was agitated, somewhat combative, confused, moaning non sensible words. CT head was negative. UDS positive for benzos and amphetamines. She was hypertensive to 190s systolic and tachycardic to 120s. She had fever to 101.2 rectally. She received Ativan  and Benadryl  without improvement.  On exam, she is profoundly hypovolemic and is being fluid resuscitated. Her pupils are dilated and are reactive to light. She has NO meningismus and I am able to flex/extend her neck and move it laterally without obvious resistance or noticeable discomfort.  Due to her AMS, PCCM asked to admit. At the time of our evaluation, she does not appear to have any ICU needs and appears stable for SDU admission. She did receive 20mg  Labetalol  with improvement in HR from 120s to 80s and SBP 190s to 180s.  Pertinent  Medical History:  has Hypothyroidism; GI bleed; Alcohol abuse; AKI (acute kidney injury); Protein-calorie malnutrition, severe; Essential hypertension; Lung nodules; COPD (chronic obstructive pulmonary disease) (HCC); Hypercalcemia; Macrocytosis; Orthostasis; Chronic abdominal pain; Insomnia; Cramps of lower extremity; Aortic atherosclerosis; GERD (gastroesophageal reflux disease); Benign hypertensive kidney disease with chronic kidney disease; Chronic low back pain (1ry area of Pain) (Bilateral) w/o sciatica; Stage 3 chronic kidney disease (HCC); Acute respiratory failure with hypoxia (HCC);  Chronic obstructive pulmonary disease (HCC); COPD with acute exacerbation (HCC); Acquired hypothyroidism; Insomnia due to other mental disorder; Lesion of epiglottis; Chronic pain syndrome; Pharmacologic therapy; Disorder of skeletal system; Problems influencing health status; Lumbar facet syndrome (Bilateral) (R>L); Chronic sacroiliac joint pain (Left); Abnormal drug screen (12/12/2019); Substance use disorder; Long term prescription benzodiazepine use; Chronic, continuous use of opioids; Amphetamine use; Intractable nausea and vomiting; Abdominal pain; UTI (urinary tract infection); Diarrhea; Gastroenteritis due to COVID-19 virus; Dehydration; Iron  deficiency anemia; Elevated lipase; Mixed hyperlipidemia; Severe sepsis (HCC); Pneumococcal pneumonia; Leg ulcer, left (HCC); GI bleeding; Heme positive stool; Acute gastric ulcer without hemorrhage or perforation; Duodenal ulcer; Duodenal stenosis; COPD exacerbation (HCC); and Acute metabolic encephalopathy on their problem list.  Significant Hospital Events: Including procedures, antibiotic start and stop dates in addition to other pertinent events   12/22 admit.  Interim History / Subjective:  Altered, moaning non sensible words.  Objective:  Blood pressure (!) 181/72, pulse 89, temperature (!) 101.9 F (38.8 C), temperature source Rectal, resp. rate (!) 21, height 4' 10 (1.473 m), weight 50.3 kg, SpO2 100%.       No intake or output data in the 24 hours ending 05/28/24 1727 Filed Weights   05/28/24 9092  Weight: 50.3 kg     Physical Exam: General: Adult female, resting in bed, in NAD. Neuro: Altered, saying nonsensible moans/sounds, does move all extremities. HEENT: [place]/AT. Pupils dilated but reactive. MM very dry. No meningismus noted. Cardiovascular: Tachy, regular, no M/R/G.  Lungs: Respirations even and unlabored.  CTA bilaterally, No W/R/R. Abdomen: BS x 4, soft, NT/ND.  Musculoskeletal: Skin very dry with increase turgor. No gross  deformities, no edema.  Labs/imaging personally reviewed:  CT head 12/22  > neg.  Assessment & Plan:   Acute metabolic encephalopathy - unclear etiology but DDx broad at this point including toxidrome (UDS positive for benzos and amphetamines), additional unknown ingestion, withdrawal, meningitis, wernickes, ICH (initial CTH negative). Polysubstance abuse, EtOH dependence, tobacco dependence. - Ok for SDU admission currently. - Avoid sedating meds. Holding PTA Norco, Trazodone . - Give some time for toxins to clear and follow her status. - Empiric meningitis coverage for now: Vancomycin , Ceftriaxone , Ampicillin , Acyclovir . - Blood, urine cultures. - If no improvement by 12/23, would get LP (consider IR consult as shed likely be a difficult bedside blind procedure). - Consider neuro consult, MRI brain depending on her course. - Thiamine /Folate. - Nicotine  patch. - Substance abuse counseling.  Lactic Acidosis - presumed primarily hypovolemia/dehydration but could be component sepsis as well. Downtrend is encouraging. - Continue fluids. - Repeat lactate after additional 1L LR bolus.  Hypertension and Tachycardia - presumed 2/2 above. Improved with fluids and 1 dose Labetalol . - Continue fluid resuscitation. - 20mg  Hydralazine  x 1. - Supportive care as above.  Hx hypothyroidism - TSH and T4 WNL. - Hold PTA Synthroid  for now, can continue in AM once able to safely take PO again.   Will ask TRH to pickup in AM 12/23 with PCCM off.  Labs   CBC: Recent Labs  Lab 05/28/24 0943  WBC 15.9*  NEUTROABS 14.2*  HGB 18.0*  HCT 52.1*  MCV 93.0  PLT 327    Basic Metabolic Panel: Recent Labs  Lab 05/28/24 0943  NA 138  K 4.0  CL 98  CO2 18*  GLUCOSE 155*  BUN 16  CREATININE 0.97  CALCIUM  10.6*   GFR: Estimated Creatinine Clearance: 43 mL/min (by C-G formula based on SCr of 0.97 mg/dL). Recent Labs  Lab 05/28/24 0937 05/28/24 0943 05/28/24 1504  WBC  --  15.9*  --    LATICACIDVEN 4.1*  --  2.8*    Liver Function Tests: Recent Labs  Lab 05/28/24 0943  AST 33  ALT 7  ALKPHOS 136*  BILITOT 0.5  PROT 7.9  ALBUMIN 4.6   No results for input(s): LIPASE, AMYLASE in the last 168 hours. No results for input(s): AMMONIA in the last 168 hours.  ABG    Component Value Date/Time   HCO3 26.6 03/15/2024 2251   TCO2 25 12/02/2022 2243   O2SAT 91.8 03/15/2024 2251     Coagulation Profile: No results for input(s): INR, PROTIME in the last 168 hours.  Cardiac Enzymes: Recent Labs  Lab 05/28/24 0943  CKTOTAL 141    HbA1C: No results found for: HGBA1C  CBG: Recent Labs  Lab 05/28/24 0910  GLUCAP 159*    Review of Systems:   Unable to obtain as pt is encephalopathic.  Past Medical History:  She,  has a past medical history of Acute diverticulitis (07/16/2013), Acute renal failure (07/16/2013), Arthritis, Colitis (07/16/2013), Diverticulitis, Elevated CEA (08/15/2017), GERD (gastroesophageal reflux disease), GIB (gastrointestinal bleeding) (01/07/2012), Hypertension, Hypothyroidism, Intractable nausea and vomiting (05/30/2022), Lesion of epiglottis, Macrocytosis without anemia (08/15/2017), and Orthostasis (08/22/2017).   Surgical History:   Past Surgical History:  Procedure Laterality Date   ABDOMINAL HYSTERECTOMY     APPENDECTOMY     BIOPSY  12/04/2022   Procedure: BIOPSY;  Surgeon: Albertus Gordy HERO, MD;  Location: [facility] ENDOSCOPY;  Service: Gastroenterology;;   CHOLECYSTECTOMY     COLONOSCOPY  01/09/2012   Procedure: COLONOSCOPY;  Surgeon: Jerrell KYM Sol, MD;  Location: [facility]  ENDOSCOPY;  Service: Endoscopy;  Laterality: N/A;   COLONOSCOPY WITH PROPOFOL  N/A 08/25/2017   Procedure: COLONOSCOPY WITH PROPOFOL ;  Surgeon: Therisa Bi, MD;  Location: [facility] ENDOSCOPY;  Service: Gastroenterology;  Laterality: N/A;   ESOPHAGOGASTRODUODENOSCOPY  01/07/2012   Procedure: ESOPHAGOGASTRODUODENOSCOPY (EGD);  Surgeon: Lynwood LITTIE Celestia Mickey., MD;  Location: [facility]  ENDOSCOPY;  Service: Endoscopy;  Laterality: N/A;   ESOPHAGOGASTRODUODENOSCOPY (EGD) WITH PROPOFOL  N/A 08/25/2017   Procedure: ESOPHAGOGASTRODUODENOSCOPY (EGD) WITH PROPOFOL ;  Surgeon: Therisa Bi, MD;  Location: [facility] ENDOSCOPY;  Service: Gastroenterology;  Laterality: N/A;   ESOPHAGOGASTRODUODENOSCOPY (EGD) WITH PROPOFOL  N/A 12/04/2022   Procedure: ESOPHAGOGASTRODUODENOSCOPY (EGD) WITH PROPOFOL ;  Surgeon: Albertus Gordy HERO, MD;  Location: [facility] ENDOSCOPY;  Service: Gastroenterology;  Laterality: N/A;   VIDEO BRONCHOSCOPY Bilateral 05/14/2016   Procedure: VIDEO BRONCHOSCOPY WITHOUT FLUORO;  Surgeon: Lamar GORMAN Chris, MD;  Location: [facility] ENDOSCOPY;  Service: Cardiopulmonary;  Laterality: Bilateral;     Social History:   reports that she has been smoking cigarettes. She started smoking about 50 years ago. She has a 84 pack-year smoking history. She has never used smokeless tobacco. She reports current alcohol use of about 35.0 standard drinks of alcohol per week. She reports that she does not use drugs.   Family History:  Her family history includes Breast cancer in her mother; Kidney disease in her father; Melanoma in her mother; Stomach cancer in her mother; Throat cancer in her mother.   Allergies Allergies[1]   Home Medications  Prior to Admission medications  Medication Sig Start Date End Date Taking? Authorizing Provider  albuterol  (PROVENTIL ) (2.5 MG/3ML) 0.083% nebulizer solution Take 3 mLs (2.5 mg total) by nebulization every 4 (four) hours as needed for shortness of breath. 03/17/24   Cox, Amy N, DO  albuterol  (VENTOLIN  HFA) 108 (90 Base) MCG/ACT inhaler Inhale 2 puffs into the lungs every 6 (six) hours as needed for wheezing or shortness of breath. 06/26/19   [provider]  Biotin 5 MG TABS Take 1 tablet by mouth daily. 08/10/18   [provider]  Cholecalciferol  125 MCG (5000 UT) TABS Take 10,000 Units by mouth daily. 08/10/18   [provider]  diclofenac  Sodium  (VOLTAREN ) 1 % GEL Apply 2 g topically 3 (three) times daily as needed (to thumb for pain). 01/14/21   [provider]  ferrous sulfate  325 (65 FE) MG tablet Take 1 tablet (325 mg total) by mouth 2 (two) times daily with a meal. 12/05/22 02/03/23  Dennise Lavada POUR, MD  folic acid  (FOLVITE ) 1 MG tablet Take 1 tablet (1 mg total) by mouth daily. Patient not taking: Reported on 03/16/2024 12/05/22   Singh, Prashant K, MD  HYDROcodone -acetaminophen  (NORCO/VICODIN) 5-325 MG tablet Take 2 tablets by mouth 3 (three) times daily as needed. 10/06/22   [provider]  levothyroxine  (SYNTHROID ) 88 MCG tablet Take 88 mcg by mouth daily. 02/24/24   [provider]  naloxone [facility]) nasal spray 4 mg/0.1 mL Place 1 spray into the nose once. 09/27/22   [provider]  ondansetron  (ZOFRAN ) 4 MG tablet Take 1 tablet (4 mg total) by mouth every 6 (six) hours as needed for nausea or vomiting. 03/17/24   Cox, Amy N, DO  pantoprazole  (PROTONIX ) 40 MG tablet Take 1 tablet (40 mg total) by mouth 2 (two) times daily. 12/05/22 04/04/23  Singh, Prashant K, MD  predniSONE  (DELTASONE ) 20 MG tablet Take 2 tablets (40 mg total) by mouth daily with breakfast. 03/18/24   Cox, Amy N, DO  rosuvastatin  (CRESTOR )  5 MG tablet Take 1 tablet (5 mg total) by mouth every evening. For cholesterol. Patient not taking: Reported on 11/09/2022 09/17/19   Clark, Katherine K, NP  traZODone  (DESYREL ) 50 MG tablet Take 50-100 mg by mouth at bedtime. 10/17/22   [provider]     Sammi Gore, PA - [facility] For pager details, please see AMION or use Epic chat  After 1900, please call ELINK for cross coverage needs 05/28/2024, 5:27 PM       [1] No Known Allergies  "

## 2024-05-28 NOTE — ED Provider Notes (Signed)
 "  EMERGENCY DEPARTMENT AT [facility] Provider Note   CSN: [id number] Arrival date & time: 05/28/24  9163     Patient presents with: Altered Mental Status   [name] is a 63 y.o. female.   HPI Patient presents to ED via [facility] from home per ems family states patient became altered last pm and was c/o headache patient wasn't better this am upon ems arrival patient became combative and was given Haldol  5 mg and versed  5 mg IM upon arrival patient is just moaing and will not follow commands.     Prior to Admission medications  Medication Sig Start Date End Date Taking? Authorizing Provider  albuterol  (PROVENTIL ) (2.5 MG/3ML) 0.083% nebulizer solution Take 3 mLs (2.5 mg total) by nebulization every 4 (four) hours as needed for shortness of breath. 03/17/24   Cox, Amy N, DO  albuterol  (VENTOLIN  HFA) 108 (90 Base) MCG/ACT inhaler Inhale 2 puffs into the lungs every 6 (six) hours as needed for wheezing or shortness of breath. 06/26/19   [provider]  Biotin 5 MG TABS Take 1 tablet by mouth daily. 08/10/18   [provider]  Cholecalciferol  125 MCG (5000 UT) TABS Take 10,000 Units by mouth daily. 08/10/18   [provider]  diclofenac  Sodium (VOLTAREN ) 1 % GEL Apply 2 g topically 3 (three) times daily as needed (to thumb for pain). 01/14/21   [provider]  ferrous sulfate  325 (65 FE) MG tablet Take 1 tablet (325 mg total) by mouth 2 (two) times daily with a meal. 12/05/22 02/03/23  Dennise Lavada POUR, MD  folic acid  (FOLVITE ) 1 MG tablet Take 1 tablet (1 mg total) by mouth daily. Patient not taking: Reported on 03/16/2024 12/05/22   Singh, Prashant K, MD  HYDROcodone -acetaminophen  (NORCO/VICODIN) 5-325 MG tablet Take 2 tablets by mouth 3 (three) times daily as needed. 10/06/22   [provider]  levothyroxine  (SYNTHROID ) 88 MCG tablet Take 88 mcg by mouth daily. 02/24/24   [provider]  naloxone [facility]) nasal spray 4  mg/0.1 mL Place 1 spray into the nose once. 09/27/22   [provider]  ondansetron  (ZOFRAN ) 4 MG tablet Take 1 tablet (4 mg total) by mouth every 6 (six) hours as needed for nausea or vomiting. 03/17/24   Cox, Amy N, DO  pantoprazole  (PROTONIX ) 40 MG tablet Take 1 tablet (40 mg total) by mouth 2 (two) times daily. 12/05/22 04/04/23  Dennise Lavada POUR, MD  predniSONE  (DELTASONE ) 20 MG tablet Take 2 tablets (40 mg total) by mouth daily with breakfast. 03/18/24   Cox, Amy N, DO  rosuvastatin  (CRESTOR ) 5 MG tablet Take 1 tablet (5 mg total) by mouth every evening. For cholesterol. Patient not taking: Reported on 11/09/2022 09/17/19   Clark, Katherine K, NP  traZODone  (DESYREL ) 50 MG tablet Take 50-100 mg by mouth at bedtime. 10/17/22   [provider]    Allergies: Patient has no known allergies.    Review of Systems  Updated Vital Signs BP (!) 207/83   Pulse (!) 106   Temp (!) 101.9 F (38.8 C) (Rectal)   Resp (!) 27   Ht 1.473 m (4' 10)   Wt 50.3 kg   SpO2 99%   BMI 23.18 kg/m   Physical Exam Vitals and nursing note reviewed.  Constitutional:      Appearance: She is well-developed. She is ill-appearing.  HENT:     Head: Normocephalic and atraumatic.  Eyes:     Conjunctiva/sclera:  Conjunctivae normal.     Comments: Does not track  Cardiovascular:     Rate and Rhythm: Regular rhythm. Tachycardia present.  Pulmonary:     Effort: Pulmonary effort is normal. Tachypnea present.     Breath sounds: No stridor.  Abdominal:     General: There is no distension.  Skin:    General: Skin is warm and dry.  Neurological:     Cranial Nerves: No cranial nerve deficit.  Psychiatric:        Mood and Affect: Mood normal.     (all labs ordered are listed, but only abnormal results are displayed) Labs Reviewed  COMPREHENSIVE METABOLIC PANEL WITH GFR - Abnormal; Notable for the following components:      Result Value   CO2 18 (*)    Glucose, Bld 155 (*)    Calcium  10.6 (*)     Alkaline Phosphatase 136 (*)    Anion gap 22 (*)    All other components within normal limits  CBC WITH DIFFERENTIAL/PLATELET - Abnormal; Notable for the following components:   WBC 15.9 (*)    RBC 5.60 (*)    Hemoglobin 18.0 (*)    HCT 52.1 (*)    Neutro Abs 14.2 (*)    Abs Immature Granulocytes 0.08 (*)    All other components within normal limits  URINALYSIS, ROUTINE W REFLEX MICROSCOPIC - Abnormal; Notable for the following components:   Hgb urine dipstick MODERATE (*)    Protein, ur >300 (*)    All other components within normal limits  URINE DRUG SCREEN - Abnormal; Notable for the following components:   Benzodiazepines POSITIVE (*)    Amphetamines POSITIVE (*)    All other components within normal limits  CBG MONITORING, ED - Abnormal; Notable for the following components:   Glucose-Capillary 159 (*)    All other components within normal limits  I-STAT CG4 LACTIC ACID, ED - Abnormal; Notable for the following components:   Lactic Acid, Venous 4.1 (*)    All other components within normal limits  I-STAT CG4 LACTIC ACID, ED - Abnormal; Notable for the following components:   Lactic Acid, Venous 2.8 (*)    All other components within normal limits  CULTURE, BLOOD (ROUTINE X 2)  RESP PANEL BY RT-PCR (RSV, FLU A&B, COVID)  [id number]  CULTURE, BLOOD (ROUTINE X 2)  CK  ETHANOL  URINALYSIS, MICROSCOPIC (REFLEX)  TSH  T4, FREE    EKG: EKG Interpretation Date/Time:  Monday May 28 2024 08:53:41 EST Ventricular Rate:  116 PR Interval:  156 QRS Duration:  87 QT Interval:  345 QTC Calculation: 480 R Axis:   77  Text Interpretation: Sinus tachycardia Biatrial enlargement Left ventricular hypertrophy Anterior infarct, old Confirmed by Garrick Charleston [phone]) on 05/28/2024 9:48:24 AM  Radiology: CT HEAD WO CONTRAST Result Date: 05/28/2024 EXAM: CT HEAD WITHOUT CONTRAST 05/28/2024 10:50:44 AM TECHNIQUE: CT of the head was performed without the administration of  intravenous contrast. Automated exposure control, iterative reconstruction, and/or weight based adjustment of the mA/kV was utilized to reduce the radiation dose to as low as reasonably achievable. COMPARISON: None available. CLINICAL HISTORY: Mental status change, unknown cause FINDINGS: BRAIN AND VENTRICLES: No acute hemorrhage. No evidence of acute infarct. No hydrocephalus. No extra-axial collection. No mass effect or midline shift. Scattered periventricular and subcortical white matter low-density changes compatible with chronic microvascular ischemic change. Small remote lacunar infarct in left basal ganglia. ORBITS: No acute abnormality. SINUSES: No acute abnormality. SOFT TISSUES AND SKULL: No acute  soft tissue abnormality. No skull fracture. IMPRESSION: 1. No acute intracranial abnormality. 2. Scattered periventricular and subcortical white matter low-density changes compatible with chronic microvascular ischemic change. 3. Small remote lacunar infarct in the left basal ganglia. Electronically signed by: Evalene Coho MD 05/28/2024 11:04 AM EST RP Workstation: [id number]   DG Chest Port 1 View Result Date: 05/28/2024 CLINICAL DATA:  Altered level of consciousness. EXAM: PORTABLE CHEST 1 VIEW COMPARISON:  03/15/2024 FINDINGS: The heart size and mediastinal contours are within normal limits. Both lungs are clear. The visualized skeletal structures are unremarkable. IMPRESSION: No active disease. Electronically Signed   By: Norleen DELENA Kil M.D.   On: 05/28/2024 09:44     Procedures   Medications Ordered in the ED  lactated ringers  infusion (0 mLs Intravenous Stopped 05/28/24 1532)  sodium chloride  0.9 % bolus 1,000 mL (0 mLs Intravenous Stopped 05/28/24 1027)  acetaminophen  (TYLENOL ) suppository 650 mg (650 mg Rectal Given 05/28/24 0914)  lactated ringers  bolus 1,000 mL (0 mLs Intravenous Stopped 05/28/24 1215)  ceFEPIme  (MAXIPIME ) 2 g in sodium chloride  0.9 % 100 mL IVPB (0 g Intravenous  Stopped 05/28/24 0951)  metroNIDAZOLE  (FLAGYL ) IVPB 500 mg (0 mg Intravenous Stopped 05/28/24 1025)  vancomycin  (VANCOREADY) IVPB 1250 mg/250 mL (0 mg Intravenous Stopped 05/28/24 1215)  ketorolac  (TORADOL ) 30 MG/ML injection 15 mg (15 mg Intravenous Given 05/28/24 1250)  sodium chloride  0.9 % bolus 1,000 mL (1,000 mLs Intravenous New Bag/Given 05/28/24 1538)  diphenhydrAMINE  (BENADRYL ) injection 25 mg (25 mg Intravenous Given 05/28/24 1551)  labetalol  (NORMODYNE ) injection 20 mg (20 mg Intravenous Given 05/28/24 1555)                                    Medical Decision Making Adult female who arrives with altered mental status, possible fall.  Broad differential including trauma, infection.  Patient's initial vitals consistent with infection she meets SIRS criteria, received broad-spectrum antibiotics, fluid resuscitation per protocol, pending labs, CT, x-ray. Cardiac 120 sinus tach abnormal pulse ox 100% nasal cannula 2 L abnormal  Amount and/or Complexity of Data Reviewed Independent Historian: EMS External Data Reviewed: notes. Labs: ordered. Decision-making details documented in ED Course. Radiology: ordered and independent interpretation performed. Decision-making details documented in ED Course. ECG/medicine tests: ordered and independent interpretation performed. Decision-making details documented in ED Course.  Risk OTC drugs. Prescription drug management. Decision regarding hospitalization. Diagnosis or treatment significantly limited by social determinants of health.  Update: Patient has received initial fluids, continues to rise, without direction.  Update: Patient's fianc at bedside, he states that this is entirely unlike the patient, she was well prior to the onset which is unclear, possibly yesterday.  Update:, Patient remains hypertensive, rising, tachycardic, fever has improved marginally after Tylenol , Toradol . She is moving her neck spontaneously, has no obvious  cutaneous abnormalities, h has x-ray without obvious pneumonia, urinalysis without obvious infection, though she meets sepsis criteria, other considerations including metabolic derangement syndrome, thyroid  storm considered.  3:59 PM I have discussed the patient's case with our critical care team for evaluation. Patient's airway remains patent, she remains tachycardic, hypertensive, though more calm after Ativan , Benadryl .  CRITICAL CARE Performed by: Lamar Salen Total critical care time: 35 minutes Critical care time was exclusive of separately billable procedures and treating other patients. Critical care was necessary to treat or prevent imminent or life-threatening deterioration. Critical care was time spent personally by me on the following activities: development of treatment  plan with patient and/or surrogate as well as nursing, discussions with consultants, evaluation of patient's response to treatment, examination of patient, obtaining history from patient or surrogate, ordering and performing treatments and interventions, ordering and review of laboratory studies, ordering and review of radiographic studies, pulse oximetry and re-evaluation of patient's condition.   Final diagnoses:  Severe sepsis [facility])  Acute encephalopathy    ED Discharge Orders     None          Garrick Charleston, MD 05/28/24 1600  "

## 2024-05-28 NOTE — ED Triage Notes (Signed)
 Patient presents to ED via [facility] from home per ems family states patient became altered last pm and was c/o headache patient wasn't better this am upon ems arrival patient became combative and was given Haldol  5 mg and versed  5 mg IM  upon arrival patient is just moaing and will not follow commands.

## 2024-05-28 NOTE — ED Notes (Signed)
 CCMD called.

## 2024-05-28 NOTE — Progress Notes (Signed)
 Pharmacy Antibiotic Note  [name] is a 63 y.o. female for which pharmacy has been consulted for acyclovir  and vancomycin  dosing for meningitis.  Estimated Creatinine Clearance: 43 mL/min (by C-G formula based on SCr of 0.97 mg/dL).  Plan: Ampicillin  2g q6h Rocephin  2g q12h Vancomycin  1250 mg once followed by 500 mg q12h per nomogram dosing for CNS infection indication (target 15-20) Acyclovier 10mg /kg q12h --LR running at 150 ml/hr Monitor WBC, fever, renal function, cultures De-escalate when able Levels at steady state  Height: 4' 10 (147.3 cm) Weight: 50.3 kg (110 lb 14.3 oz) IBW/kg (Calculated) : 40.9  Temp (24hrs), Avg:101.6 F (38.7 C), Min:101.2 F (38.4 C), Max:101.9 F (38.8 C)  Recent Labs  Lab 05/28/24 0937 05/28/24 0943 05/28/24 1504  WBC  --  15.9*  --   CREATININE  --  0.97  --   LATICACIDVEN 4.1*  --  2.8*    Estimated Creatinine Clearance: 43 mL/min (by C-G formula based on SCr of 0.97 mg/dL).    Allergies[1]  Microbiology results: Pending  Thank you for allowing pharmacy to be a part of this patients care.  Dorn Buttner, PharmD, BCPS 05/28/2024 5:26 PM ED Clinical Pharmacist -  [phone]      [1] No Known Allergies

## 2024-05-28 NOTE — ED Notes (Signed)
 IT contacted to have someone troubleshoot the scanner after a report was received that the scanner was not working. It would not turn on for use.

## 2024-05-28 NOTE — ED Notes (Signed)
 Pt agitated, moaning, and tossing and turning in stretcher. She has been changed multiple times. Fiance at bedside. Fiance said pt complained of staggering gait 2 days ago and last night complained of a headache. When he woke up, he said she was moaning and not responding to his questions so he called ems.

## 2024-05-28 NOTE — ED Notes (Signed)
 Urine on brief and chuck upon assessment.Pt cleaned, brief and new bedding applied.

## 2024-05-28 NOTE — Sepsis Progress Note (Signed)
 Elink will follow per sepsis protocol.

## 2024-05-28 NOTE — Progress Notes (Signed)
 eLink Physician-Brief Progress Note Patient Name: [name] DOB: [birth year] MRN: [id number]   Date of Service  05/28/2024  HPI/Events of Note  Lactic acid 3.0, remaining hemodynamics unchanged, ongoing crystalloid infusion  eICU Interventions  Equivocal change, no repeat indicated, continue IVF   0015 - temp 101.5, no indication for antipyretics. Withdrawing and using prn ativan , no change indicated.   - 2023 SCCM/IDSA guidelines: For critically ill patients with fever, we suggest avoiding the routine use of antipyretic medications for the specific purpose of reducing the temperature.   9543 -has ongoing severe agitation and patient is wild .  Renew Ativan  as needed for severe agitation  Intervention Category Minor Interventions: Clinical assessment - ordering diagnostic tests  Stephan Draughn 05/28/2024, 11:21 PM

## 2024-05-28 NOTE — Progress Notes (Signed)
 PCCM Brief Note  Accidentally ordered med surge instead of Progressive. Transfer order added as progressive is needed here.  Also has persistent hypertension, intermittent tachycardia, intermittent agitation.  Will add PRN Lorazepam , PRN Hydralazine , PRN Lopressor .

## 2024-05-29 ENCOUNTER — Inpatient Hospital Stay (HOSPITAL_COMMUNITY)

## 2024-05-29 DIAGNOSIS — G9341 Metabolic encephalopathy: Secondary | ICD-10-CM | POA: Diagnosis not present

## 2024-05-29 LAB — CSF CELL COUNT WITH DIFFERENTIAL
Eosinophils, CSF: 0 % (ref 0–1)
Lymphs, CSF: 10 % — ABNORMAL LOW (ref 40–80)
Monocyte-Macrophage-Spinal Fluid: 8 % — ABNORMAL LOW (ref 15–45)
RBC Count, CSF: 3000 /mm3 — ABNORMAL HIGH
RBC Count, CSF: 885 /mm3 — ABNORMAL HIGH
Segmented Neutrophils-CSF: 82 % — ABNORMAL HIGH (ref 0–6)
Tube #: 1
Tube #: 4
WBC, CSF: 45 /mm3 (ref 0–5)
WBC, CSF: 9 /mm3 — ABNORMAL HIGH (ref 0–5)

## 2024-05-29 LAB — CBC
HCT: 49 % — ABNORMAL HIGH (ref 36.0–46.0)
Hemoglobin: 17.2 g/dL — ABNORMAL HIGH (ref 12.0–15.0)
MCH: 32 pg (ref 26.0–34.0)
MCHC: 35.1 g/dL (ref 30.0–36.0)
MCV: 91.1 fL (ref 80.0–100.0)
Platelets: 284 K/uL (ref 150–400)
RBC: 5.38 MIL/uL — ABNORMAL HIGH (ref 3.87–5.11)
RDW: 14.1 % (ref 11.5–15.5)
WBC: 18 K/uL — ABNORMAL HIGH (ref 4.0–10.5)
nRBC: 0 % (ref 0.0–0.2)

## 2024-05-29 LAB — POCT I-STAT 7, (LYTES, BLD GAS, ICA,H+H)
Acid-base deficit: 1 mmol/L (ref 0.0–2.0)
Bicarbonate: 21.8 mmol/L (ref 20.0–28.0)
Calcium, Ion: 1.13 mmol/L — ABNORMAL LOW (ref 1.15–1.40)
HCT: 41 % (ref 36.0–46.0)
Hemoglobin: 13.9 g/dL (ref 12.0–15.0)
O2 Saturation: 100 %
Potassium: 2.7 mmol/L — CL (ref 3.5–5.1)
Sodium: 133 mmol/L — ABNORMAL LOW (ref 135–145)
TCO2: 23 mmol/L (ref 22–32)
pCO2 arterial: 31.4 mmHg — ABNORMAL LOW (ref 32–48)
pH, Arterial: 7.45 (ref 7.35–7.45)
pO2, Arterial: 202 mmHg — ABNORMAL HIGH (ref 83–108)

## 2024-05-29 LAB — PHOSPHORUS: Phosphorus: 2.3 mg/dL — ABNORMAL LOW (ref 2.5–4.6)

## 2024-05-29 LAB — BASIC METABOLIC PANEL WITH GFR
Anion gap: 19 — ABNORMAL HIGH (ref 5–15)
Anion gap: 11 (ref 5–15)
BUN: 15 mg/dL (ref 8–23)
BUN: 17 mg/dL (ref 8–23)
CO2: 15 mmol/L — ABNORMAL LOW (ref 22–32)
CO2: 18 mmol/L — ABNORMAL LOW (ref 22–32)
Calcium: 9.2 mg/dL (ref 8.9–10.3)
Calcium: 7.4 mg/dL — ABNORMAL LOW (ref 8.9–10.3)
Chloride: 98 mmol/L (ref 98–111)
Chloride: 100 mmol/L (ref 98–111)
Creatinine, Ser: 0.88 mg/dL (ref 0.44–1.00)
Creatinine, Ser: 0.9 mg/dL (ref 0.44–1.00)
GFR, Estimated: >60 mL/min
GFR, Estimated: >60 mL/min
Glucose, Bld: 107 mg/dL — ABNORMAL HIGH (ref 70–99)
Glucose, Bld: 104 mg/dL — ABNORMAL HIGH (ref 70–99)
Potassium: 3.4 mmol/L — ABNORMAL LOW (ref 3.5–5.1)
Potassium: 4 mmol/L (ref 3.5–5.1)
Sodium: 132 mmol/L — ABNORMAL LOW (ref 135–145)
Sodium: 129 mmol/L — ABNORMAL LOW (ref 135–145)

## 2024-05-29 LAB — LACTIC ACID, PLASMA: Lactic Acid, Venous: 4.5 mmol/L (ref 0.5–1.9)

## 2024-05-29 LAB — MRSA NEXT GEN BY PCR, NASAL: MRSA by PCR Next Gen: NOT DETECTED

## 2024-05-29 LAB — STREP PNEUMONIAE URINARY ANTIGEN: Strep Pneumo Urinary Antigen: NEGATIVE

## 2024-05-29 LAB — MAGNESIUM: Magnesium: 1.3 mg/dL — ABNORMAL LOW (ref 1.7–2.4)

## 2024-05-29 MED ORDER — MAGNESIUM SULFATE 4 GM/100ML IV SOLN
4.0000 g | Freq: Once | INTRAVENOUS | Status: AC
  Administered 2024-05-29: 4 g via INTRAVENOUS
  Filled 2024-05-29: qty 100

## 2024-05-29 MED ORDER — FOLIC ACID 5 MG/ML IJ SOLN
1.0000 mg | Freq: Every day | INTRAMUSCULAR | Status: DC
  Administered 2024-05-29 – 2024-05-31 (×3): 1 mg via INTRAVENOUS
  Filled 2024-05-29 (×5): qty 0.2

## 2024-05-29 MED ORDER — MIDAZOLAM HCL 2 MG/2ML IJ SOLN
INTRAMUSCULAR | Status: AC
  Administered 2024-05-29: 2 mg via INTRAVENOUS
  Filled 2024-05-29: qty 2

## 2024-05-29 MED ORDER — ORAL CARE MOUTH RINSE
15.0000 mL | OROMUCOSAL | Status: DC
  Administered 2024-05-29 – 2024-06-02 (×40): 15 mL via OROMUCOSAL

## 2024-05-29 MED ORDER — FENTANYL BOLUS VIA INFUSION: 25.0000 ug | INTRAVENOUS | Status: DC | PRN

## 2024-05-29 MED ORDER — ROCURONIUM BROMIDE 10 MG/ML (PF) SYRINGE
PREFILLED_SYRINGE | INTRAVENOUS | Status: AC
  Administered 2024-05-29: 50 mg
  Filled 2024-05-29: qty 10

## 2024-05-29 MED ORDER — LACTATED RINGERS IV BOLUS
1000.0000 mL | Freq: Once | INTRAVENOUS | Status: AC
  Administered 2024-05-29: 1000 mL via INTRAVENOUS

## 2024-05-29 MED ORDER — THIAMINE HCL 100 MG/ML IJ SOLN
100.0000 mg | Freq: Every day | INTRAMUSCULAR | Status: DC
  Administered 2024-05-29: 100 mg via INTRAVENOUS
  Filled 2024-05-29: qty 2

## 2024-05-29 MED ORDER — PHENYLEPHRINE 80 MCG/ML (10ML) SYRINGE FOR IV PUSH (FOR BLOOD PRESSURE SUPPORT)
PREFILLED_SYRINGE | INTRAVENOUS | Status: AC
  Filled 2024-05-29: qty 10

## 2024-05-29 MED ORDER — ACETAMINOPHEN 10 MG/ML IV SOLN: 1000.0000 mg | Freq: Four times a day (QID) | INTRAVENOUS | Status: AC | PRN

## 2024-05-29 MED ORDER — DEXMEDETOMIDINE HCL IN NACL 400 MCG/100ML IV SOLN
0.0000 ug/kg/h | INTRAVENOUS | Status: DC
  Administered 2024-05-29: 0.8 ug/kg/h via INTRAVENOUS
  Administered 2024-05-29 – 2024-05-30 (×2): 0.4 ug/kg/h via INTRAVENOUS
  Filled 2024-05-29 (×3): qty 100

## 2024-05-29 MED ORDER — NOREPINEPHRINE 4 MG/250ML-% IV SOLN
INTRAVENOUS | Status: AC
  Filled 2024-05-29: qty 250

## 2024-05-29 MED ORDER — LACTATED RINGERS IV BOLUS
500.0000 mL | INTRAVENOUS | Status: AC
  Administered 2024-05-29: 500 mL via INTRAVENOUS

## 2024-05-29 MED ORDER — ORAL CARE MOUTH RINSE: 15.0000 mL | OROMUCOSAL | Status: DC | PRN

## 2024-05-29 MED ORDER — POTASSIUM CHLORIDE 10 MEQ/100ML IV SOLN
10.0000 meq | INTRAVENOUS | Status: AC
  Administered 2024-05-29 (×3): 10 meq via INTRAVENOUS
  Filled 2024-05-29: qty 100

## 2024-05-29 MED ORDER — DOCUSATE SODIUM 50 MG/5ML PO LIQD
100.0000 mg | Freq: Two times a day (BID) | ORAL | Status: DC
  Administered 2024-05-29: 100 mg
  Filled 2024-05-29: qty 10

## 2024-05-29 MED ORDER — POLYETHYLENE GLYCOL 3350 17 G PO PACK: 17.0000 g | PACK | Freq: Every day | ORAL | Status: DC

## 2024-05-29 MED ORDER — HYDRALAZINE HCL 20 MG/ML IJ SOLN: 10.0000 mg | INTRAMUSCULAR | Status: DC | PRN

## 2024-05-29 MED ORDER — LORAZEPAM 2 MG/ML IJ SOLN
1.0000 mg | INTRAMUSCULAR | Status: DC | PRN
  Administered 2024-05-29: 1 mg via INTRAVENOUS
  Filled 2024-05-29: qty 1

## 2024-05-29 MED ORDER — MIDAZOLAM HCL (PF) 2 MG/2ML IJ SOLN
1.0000 mg | INTRAMUSCULAR | Status: DC | PRN
  Administered 2024-05-29 – 2024-05-30 (×3): 2 mg via INTRAVENOUS
  Filled 2024-05-29 (×3): qty 2

## 2024-05-29 MED ORDER — LACTATED RINGERS IV SOLN: INTRAVENOUS | Status: DC

## 2024-05-29 MED ORDER — FENTANYL CITRATE (PF) 50 MCG/ML IJ SOSY
PREFILLED_SYRINGE | INTRAMUSCULAR | Status: AC
  Filled 2024-05-29: qty 2

## 2024-05-29 MED ORDER — SODIUM CHLORIDE 0.9 % IV SOLN
250.0000 mL | INTRAVENOUS | Status: AC
  Administered 2024-05-29: 250 mL via INTRAVENOUS

## 2024-05-29 MED ORDER — LABETALOL HCL 5 MG/ML IV SOLN
5.0000 mg | INTRAVENOUS | Status: DC | PRN
  Administered 2024-05-29: 10 mg via INTRAVENOUS
  Filled 2024-05-29: qty 4

## 2024-05-29 MED ORDER — GADOBUTROL 1 MMOL/ML IV SOLN
4.5000 mL | Freq: Once | INTRAVENOUS | Status: AC | PRN
  Administered 2024-05-29: 4.5 mL via INTRAVENOUS

## 2024-05-29 MED ORDER — IPRATROPIUM-ALBUTEROL 0.5-2.5 (3) MG/3ML IN SOLN: 3.0000 mL | Freq: Four times a day (QID) | RESPIRATORY_TRACT | Status: DC | PRN

## 2024-05-29 MED ORDER — NOREPINEPHRINE 4 MG/250ML-% IV SOLN
0.0000 ug/min | INTRAVENOUS | Status: DC
  Administered 2024-05-29: 2 ug/min via INTRAVENOUS
  Filled 2024-05-29 (×2): qty 250

## 2024-05-29 MED ORDER — HALOPERIDOL LACTATE 5 MG/ML IJ SOLN
5.0000 mg | Freq: Once | INTRAMUSCULAR | Status: AC
  Administered 2024-05-29: 5 mg via INTRAVENOUS
  Filled 2024-05-29: qty 1

## 2024-05-29 MED ORDER — LABETALOL HCL 5 MG/ML IV SOLN: 10.0000 mg | INTRAVENOUS | Status: DC | PRN

## 2024-05-29 MED ORDER — FENTANYL 2500MCG IN NS 250ML (10MCG/ML) PREMIX INFUSION
0.0000 ug/h | INTRAVENOUS | Status: DC
  Administered 2024-05-29: 50 ug/h via INTRAVENOUS
  Filled 2024-05-29: qty 250

## 2024-05-29 MED ORDER — ETOMIDATE 2 MG/ML IV SOLN
INTRAVENOUS | Status: AC
  Administered 2024-05-29: 20 mg
  Filled 2024-05-29: qty 20

## 2024-05-29 NOTE — Progress Notes (Signed)
 "  [name]  FMW:[id number] DOB: November 13, 1960 DOA: 05/28/2024 PCP: [facility]    Brief Narrative:  63 year old with a history of hypothyroidism, alcohol abuse, GI bleeding, protein calorie malnutrition, HTN, lung nodules, COPD, CKD stage IIIa, chronic low back and abdominal pain, long-term benzodiazepine use, long-term narcotic use, HLD, and PUD who was brought to the ER 05/28/2024 via EMS with headache gait disturbance and AMS for 2 days.  On arrival she was agitated confused moaning nonsensical words and combative.  CT head was negative.  UDS was positive for benzos and amphetamines.  She was hypertensive with systolic blood pressure 190 and tachycardic to 120.  She had a temperature of 101.2 rectally.  She Ativan  and Benadryl  did not improve her symptoms.  Exam in the ER revealed evidence of profound dehydration.  There was however no evidence of meningismus  Goals of Care:   Code Status: Full Code   DVT prophylaxis: SCDs Start: 05/28/24 1656   Interim Hx: Has remained severely agitated overnight.  Has not responded in any meaningful way to Ativan .  Is tachycardic with elevated blood pressure.  Fever as high as 101.9 overnight.  At the time of my visit, after receiving 5 mg of Haldol , the patient is more calm.  She did not respond to my voice but she does retract from touch.  She appears to be somewhat photophobic when I attempt to examine her pupils.  She is tachycardic and hypertensive.  She cannot provide a history.  Assessment & Plan:  Acute encephalopathy of unclear etiology w/ FUO Was placed on empiric meningitis coverage by PCCM at time of admission -influenza RSV and COVID-negative - CT head w/o acute findings -given persisting fevers I am concerned about the possibility of an infective meningitis -I feel she will need an LP and given the difficulty we have encountered controlling her agitation I suspect the only safe way to do so will be to heavily sedate her and take  control of her breathing -I have reconsulted PCCM who has graciously agreed to see her and likely proceed with sedation/intubation and LP  Polysubstance abuse UDS positive for benzos and amphetamines at time of admission -high likelihood that withdrawal is complicating her current encephalopathy -she will likely benefit from a Precedex  drip once her LP is completed  Lactic acidosis Likely primarily due to severe hypovolemia but cannot rule out sepsis related to possible meningitis  HTN Likely simply due to agitation  Tachycardia Likely simply due to agitation  Hypothyroidism TSH and T4 normal  Hyponatremia Likely hypovolemic in nature -monitor with volume expansion  Hypokalemia Likely simply due to poor intake -supplement and follow  Hypomagnesemia Likely due to poor intake -supplement and follow  Mild hypophosphatemia  Family Communication: No family present at time of exam Disposition: Transfer to care of PCCM for heavy sedation/intubation to allow for LP   Objective: Blood pressure (!) 199/95, pulse (!) 135, temperature 99.5 F (37.5 C), temperature source Axillary, resp. rate (!) 27, height 4' 10 (1.473 m), weight 47.3 kg, SpO2 96%.  Intake/Output Summary (Last 24 hours) at 05/29/2024 0827 Last data filed at 05/29/2024 0600 Gross per 24 hour  Intake 1862.22 ml  Output 850 ml  Net 1012.22 ml   Filed Weights   05/28/24 0907 05/29/24 0558  Weight: 50.3 kg 47.3 kg    Examination: General: No acute respiratory distress but is tachypneic at approximately 30 breaths/min Lungs: Faint scattered crackles with no wheezing Cardiovascular: Tachycardic but regular without appreciable murmur  Abdomen: Thin, soft, no appreciable mass, no rebound appreciable Extremities: No significant cyanosis, clubbing, or edema bilateral lower extremities Neuro: Difficulty reviewing pupils as patient resists but right pupil appears to be postoperative and slightly more dilated than left  though I cannot be sure given difficulty of exam -she does withdraw from height in the eyes which could be related to photophobia or simply her agitation -she is moving all 4 extremities chaotic late but independently with no posturing or evidence of focal weakness -there are no cranial nerve deficiencies appreciable on visual exam  CBC: Recent Labs  Lab 05/28/24 0943 05/29/24 0453  WBC 15.9* 18.0*  NEUTROABS 14.2*  --   HGB 18.0* 17.2*  HCT 52.1* 49.0*  MCV 93.0 91.1  PLT 327 284   Basic Metabolic Panel: Recent Labs  Lab 05/28/24 0943 05/29/24 0453  NA 138 132*  K 4.0 3.4*  CL 98 98  CO2 18* 15*  GLUCOSE 155* 107*  BUN 16 15  CREATININE 0.97 0.88  CALCIUM  10.6* 9.2  MG  --  1.3*  PHOS  --  2.3*   GFR: Estimated Creatinine Clearance: 43.3 mL/min (by C-G formula based on SCr of 0.88 mg/dL).   Scheduled Meds:  Chlorhexidine  Gluconate Cloth  6 each Topical Daily   folic acid   1 mg Intravenous Daily   haloperidol  lactate  5 mg Intravenous Once   nicotine   14 mg Transdermal Daily   thiamine  (VITAMIN B1) injection  100 mg Intravenous Daily   Continuous Infusions:  acyclovir  Stopped (05/28/24 2255)   ampicillin  (OMNIPEN) IV Stopped (05/29/24 0509)   cefTRIAXone  (ROCEPHIN )  IV Stopped (05/29/24 0559)   lactated ringers  125 mL/hr at 05/29/24 0825   vancomycin  Stopped (05/29/24 0038)     LOS: 1 day   Reyes IVAR Moores, MD [facility] Office  [phone] Pager - Text Page per Tracey  If 7PM-7AM, please contact night-coverage per Amion 05/29/2024, 8:27 AM     "

## 2024-05-29 NOTE — Progress Notes (Signed)
 CSF fluid transported to Lab w/o incident. Hand delivered to lab tech, and stated no certain order or way for labels to be placed on tubes.

## 2024-05-29 NOTE — Progress Notes (Addendum)
 eLink Physician-Brief Progress Note Patient Name: ROLONDA PONTARELLI DOB: Nov 27, 1960 MRN: [id number]   Date of Service  05/29/2024  HPI/Events of Note  Patient was intubated earlier in the day for severe agitation, having some sedation associated hypotension  eICU Interventions  Additional 500 cc LR bolus Norepinephrine  as needed to maintain MAP greater than 65   0058 -LP results reviewed consistent with likely traumatic lumbar puncture.  MRI results were called out by radiology with multifocal infarcts and cerebellar edema.  Findings may raise the possibility of PRES, but blood pressure is better controlled.  Ammonia is pending for a.m. studies.  Will consult with neurology.  9583 -notified of intermittent bradycardia into the 30s/40s.  No treatment indicated for asymptomatic sinus bradycardia.  Minimize Precedex , can utilize increased fentanyl  instead.  Intervention Category Intermediate Interventions: Hypotension - evaluation and management  Dayana Dalporto 05/29/2024, 7:49 PM

## 2024-05-29 NOTE — Progress Notes (Signed)
 CCM- Dr. Theophilus notified Pts BP running soft. Fentanyl  stopped. See new bolus orders.   1445- CCM updated after bolus BP still trending soft, see new orders. Precedex  stopped.

## 2024-05-29 NOTE — Progress Notes (Signed)
 CCM notified of critical CSF WBC 45, and Lactic 4.5. See new LR bolus orders.

## 2024-05-29 NOTE — Progress Notes (Signed)
 Pt transported to CT and back to 4N20 via ventilator without complications.

## 2024-05-29 NOTE — Progress Notes (Signed)
 Transported patient to MRI while patient was on the mechanical ventilator. Patient remained stable.

## 2024-05-29 NOTE — Procedures (Signed)
 Lumbar Puncture Procedure Note  [name]  [id number]  [birth year]  Date:05/29/2024  Time:1:30 PM   Provider Performing:Myrta Mercer   Procedure: Lumbar Puncture (37729)  Indication(s) Rule out meningitis  Consent Risks of the procedure as well as the alternatives and risks of each were explained to the patient and/or caregiver.  Consent for the procedure was obtained and is signed in the bedside chart  Anesthesia Topical only with 1% lidocaine     Time Out Verified patient identification, verified procedure, site/side was marked, verified correct patient position, special equipment/implants available, medications/allergies/relevant history reviewed, required imaging and test results available.   Sterile Technique Maximal sterile technique including sterile barrier drape, hand hygiene, sterile gown, sterile gloves, mask, hair covering.    Procedure Description Using palpation, approximate location of L3-L4 space identified.   Lidocaine  used to anesthetize skin and subcutaneous tissue overlying this area.  A 20g spinal needle was then used to access the subarachnoid space. Opening pressure:Not obtained. Closing pressure:11cm H2O. 12 ml clear CSF obtained.  Complications/Tolerance None; patient tolerated the procedure well.   EBL Minimal   Specimen(s) CSF   [name] MD [facility] See Amion for pager  If no response to pager , please call [phone] until 7pm After 7:00 pm call Elink  [phone] 05/29/2024, 1:30 PM

## 2024-05-29 NOTE — Progress Notes (Signed)
 Patient remained disoriented throughout the night.  Despite wearing mittens, she repeatedly removed multiple IV lines.  She received multiple doses of Ativan  with limited effect.  Dr. Haze was notified.  Patient developed a temperature of 101.5, which was reported, along with an elevated lactic acid level of 3.0 PRN ativan  order was renewed this morning.

## 2024-05-29 NOTE — Progress Notes (Signed)
 "  NAME:  [name], MRN:  [id number], DOB:  Sep 09, 1960, LOS: 1 ADMISSION DATE:  05/28/2024, CONSULTATION DATE:  05/28/24 REFERRING MD:  Garrick CHIEF COMPLAINT:  AMS   History of Present Illness:  Pt is encephelopathic; therefore, this HPI is obtained from chart review. [name] is a 63 y.o. female who has a PMH as below. She presented [facility] ED 05/28/24 via EMS for AMS, headaches, gait disturbance 2 days prior. Fiance noted AMS the night prior which did not resolve by day of presentation.  She came to ED where she was agitated, somewhat combative, confused, moaning non sensible words. CT head was negative. UDS positive for benzos and amphetamines. She was hypertensive to 190s systolic and tachycardic to 120s. She had fever to 101.2 rectally. She received Ativan  and Benadryl  without improvement.  On exam, she is profoundly hypovolemic and is being fluid resuscitated. Her pupils are dilated and are reactive to light. She has NO meningismus and I am able to flex/extend her neck and move it laterally without obvious resistance or noticeable discomfort.  Due to her AMS, PCCM asked to admit. At the time of our evaluation, she does not appear to have any ICU needs and appears stable for SDU admission. She did receive 20mg  Labetalol  with improvement in HR from 120s to 80s and SBP 190s to 180s.  Pertinent  Medical History:  has Hypothyroidism; GI bleed; Alcohol abuse; AKI (acute kidney injury); Protein-calorie malnutrition, severe; Essential hypertension; Lung nodules; COPD (chronic obstructive pulmonary disease) (HCC); Hypercalcemia; Macrocytosis; Orthostasis; Chronic abdominal pain; Insomnia; Cramps of lower extremity; Aortic atherosclerosis; GERD (gastroesophageal reflux disease); Benign hypertensive kidney disease with chronic kidney disease; Chronic low back pain (1ry area of Pain) (Bilateral) w/o sciatica; Stage 3 chronic kidney disease (HCC); Acute respiratory failure with hypoxia (HCC);  Chronic obstructive pulmonary disease (HCC); COPD with acute exacerbation (HCC); Acquired hypothyroidism; Insomnia due to other mental disorder; Lesion of epiglottis; Chronic pain syndrome; Pharmacologic therapy; Disorder of skeletal system; Problems influencing health status; Lumbar facet syndrome (Bilateral) (R>L); Chronic sacroiliac joint pain (Left); Abnormal drug screen (12/12/2019); Substance use disorder; Long term prescription benzodiazepine use; Chronic, continuous use of opioids; Amphetamine use; Intractable nausea and vomiting; Abdominal pain; UTI (urinary tract infection); Diarrhea; Gastroenteritis due to COVID-19 virus; Dehydration; Iron  deficiency anemia; Elevated lipase; Mixed hyperlipidemia; Severe sepsis (HCC); Pneumococcal pneumonia; Leg ulcer, left (HCC); GI bleeding; Heme positive stool; Acute gastric ulcer without hemorrhage or perforation; Duodenal ulcer; Duodenal stenosis; COPD exacerbation (HCC); and Acute metabolic encephalopathy on their problem list.  Significant Hospital Events: Including procedures, antibiotic start and stop dates in addition to other pertinent events   12/22 admit. 12/23 txf to [facility]. Txf back to ICU   Interim History / Subjective:  Called back for ongoing agitation  Rvcd haldol  this morning   Fever overnight 101.5, no further fevers captured   LA 3 WBC 18 h/h 17.2/49   Objective:  Blood pressure (!) 205/96, pulse (!) 135, temperature 99.5 F (37.5 C), temperature source Axillary, resp. rate (!) 27, height 4' 10 (1.473 m), weight 47.3 kg, SpO2 96%.        Intake/Output Summary (Last 24 hours) at 05/29/2024 1033 Last data filed at 05/29/2024 0600 Gross per 24 hour  Intake 1862.22 ml  Output 850 ml  Net 1012.22 ml   Filed Weights   05/28/24 0907 05/29/24 0558  Weight: 50.3 kg 47.3 kg     Physical Exam: General: chronically and acutely ill F  Neuro: Moving BUE BLE  spontaneously, does not follow commands.  Indeterminate nuchal rigidity,  does spontaneously turn head L & R som, but can't assess ROM  HEENT: NCAT dry mm.  Cardiovascular: tachycardic rate, cap refill < 3 sec  Lungs: CTAb, symmetrical chest expansion  Abdomen: thi ndnr  MSK: no obvious acute joint deformity Skin: significant +turgor    Labs/imaging personally reviewed:  CT head 12/22  > neg.  Assessment & Plan:   Acute encephalopathy Polysubstance abuse  EtOH abuse  Possible meningitis, sepsis  -rcvd haldol  this morning for agitation before call back for icu txf  P -txf back to ICU -NPO -cont current antimicrobial coverage for possible meningitis. -droplet precautions  -think she needs an LP. When she gets to the unit may need to intubate to facilitate vs precedex  -- will re-eval when she gets here -cont micronutrient support -hold home norco, lyrica, trazodone , flexeril   -check ammonia -when clinically appropriate, needs substance cessation support   AGMA  Lactic acidosis  Dehydration  -h/h suggest she is very concentrated vs pior labs.  P -cont IVF, supportive care -follow LA PRN   COPD Tobacco use P -PRN duoneb   HTN  P -PRN hydral if SBP > 170   Hx hypothyroidism -can defer IV synthroid  initiation 12/23.    Hypokalemia Hyponatremia Hypophosphatemia Hypomagnesemia  -replace K mag -defer phos for now -no correction to Na for now    Labs   CBC: Recent Labs  Lab 05/28/24 0943 05/29/24 0453  WBC 15.9* 18.0*  NEUTROABS 14.2*  --   HGB 18.0* 17.2*  HCT 52.1* 49.0*  MCV 93.0 91.1  PLT 327 284    Basic Metabolic Panel: Recent Labs  Lab 05/28/24 0943 05/29/24 0453  NA 138 132*  K 4.0 3.4*  CL 98 98  CO2 18* 15*  GLUCOSE 155* 107*  BUN 16 15  CREATININE 0.97 0.88  CALCIUM  10.6* 9.2  MG  --  1.3*  PHOS  --  2.3*   GFR: Estimated Creatinine Clearance: 43.3 mL/min (by C-G formula based on SCr of 0.88 mg/dL). Recent Labs  Lab 05/28/24 0937 05/28/24 0943 05/28/24 1504 05/28/24 2108 05/29/24 0453  WBC   --  15.9*  --   --  18.0*  LATICACIDVEN 4.1*  --  2.8* 3.0*  --     Liver Function Tests: Recent Labs  Lab 05/28/24 0943  AST 33  ALT 7  ALKPHOS 136*  BILITOT 0.5  PROT 7.9  ALBUMIN 4.6   No results for input(s): LIPASE, AMYLASE in the last 168 hours. No results for input(s): AMMONIA in the last 168 hours.  ABG    Component Value Date/Time   HCO3 26.6 03/15/2024 2251   TCO2 25 12/02/2022 2243   O2SAT 91.8 03/15/2024 2251     Coagulation Profile: No results for input(s): INR, PROTIME in the last 168 hours.  Cardiac Enzymes: Recent Labs  Lab 05/28/24 0943  CKTOTAL 141    HbA1C: No results found for: HGBA1C  CBG: Recent Labs  Lab 05/28/24 0910  GLUCAP 159*    CRITICAL CARE Performed by: Ronnald FORBES Gave   Total critical care time: 50 minutes  Critical care time was exclusive of separately billable procedures and treating other patients. Critical care was necessary to treat or prevent imminent or life-threatening deterioration.  Critical care was time spent personally by me on the following activities: development of treatment plan with patient and/or surrogate as well as nursing, discussions with consultants, evaluation of patient's response to treatment, examination  of patient, obtaining history from patient or surrogate, ordering and performing treatments and interventions, ordering and review of laboratory studies, ordering and review of radiographic studies, pulse oximetry and re-evaluation of patient's condition.  Ronnald Gave MSN, AGACNP-BC [facility] Amion for pager 05/29/2024, 10:33 AM    "

## 2024-05-29 NOTE — Progress Notes (Signed)
 Spoke with emergency contact Sherida Mania on the phone. Significant other x 21 years and current fiance.   Talked about moving to ICU and need for LP, possible intubation  Risks/benefits discussed. He consents to proceed with LP and and intubation if needed.     Ronnald Gave MSN, AGACNP-BC [facility] Pulmonary/Critical Care Medicine 05/29/2024, 10:52 AM

## 2024-05-29 NOTE — Procedures (Signed)
 Intubation Procedure Note  [name]  [id number]  1961/01/04  Date:05/29/2024  Time:12:31 PM   Provider Performing:Kayston Jodoin Ruthine    Procedure: Intubation (31500)  Indication(s) Respiratory Failure  Consent Risks of the procedure as well as the alternatives and risks of each were explained to the patient and/or caregiver.  Consent for the procedure was obtained and is signed in the bedside chart   Anesthesia Etomidate , Versed , and Rocuronium    Time Out Verified patient identification, verified procedure, site/side was marked, verified correct patient position, special equipment/implants available, medications/allergies/relevant history reviewed, required imaging and test results available.   Sterile Technique Usual hand hygeine, masks, and gloves were used   Procedure Description Patient positioned in bed supine.  Sedation given as noted above.  Patient was intubated with endotracheal tube using Glidescope.  View was Grade 1 full glottis .  Number of attempts was 2.  Colorimetric CO2 detector was consistent with tracheal placement.   Complications/Tolerance None; patient tolerated the procedure well. Chest X-ray is ordered to verify placement.   EBL none   Specimen(s) None

## 2024-05-30 ENCOUNTER — Inpatient Hospital Stay (HOSPITAL_COMMUNITY)

## 2024-05-30 DIAGNOSIS — G936 Cerebral edema: Secondary | ICD-10-CM | POA: Diagnosis not present

## 2024-05-30 DIAGNOSIS — I6783 Posterior reversible encephalopathy syndrome: Secondary | ICD-10-CM

## 2024-05-30 DIAGNOSIS — A419 Sepsis, unspecified organism: Secondary | ICD-10-CM

## 2024-05-30 DIAGNOSIS — I6389 Other cerebral infarction: Secondary | ICD-10-CM | POA: Diagnosis not present

## 2024-05-30 DIAGNOSIS — R29717 NIHSS score 17: Secondary | ICD-10-CM

## 2024-05-30 DIAGNOSIS — G0400 Acute disseminated encephalitis and encephalomyelitis, unspecified: Secondary | ICD-10-CM

## 2024-05-30 DIAGNOSIS — J9601 Acute respiratory failure with hypoxia: Secondary | ICD-10-CM

## 2024-05-30 LAB — COMPREHENSIVE METABOLIC PANEL WITH GFR
ALT: 9 U/L (ref 0–44)
AST: 23 U/L (ref 15–41)
Albumin: 2.9 g/dL — ABNORMAL LOW (ref 3.5–5.0)
Alkaline Phosphatase: 68 U/L (ref 38–126)
Anion gap: 11 (ref 5–15)
BUN: 17 mg/dL (ref 8–23)
CO2: 22 mmol/L (ref 22–32)
Calcium: 8.2 mg/dL — ABNORMAL LOW (ref 8.9–10.3)
Chloride: 102 mmol/L (ref 98–111)
Creatinine, Ser: 0.84 mg/dL (ref 0.44–1.00)
GFR, Estimated: >60 mL/min
Glucose, Bld: 101 mg/dL — ABNORMAL HIGH (ref 70–99)
Potassium: 3.1 mmol/L — ABNORMAL LOW (ref 3.5–5.1)
Sodium: 134 mmol/L — ABNORMAL LOW (ref 135–145)
Total Bilirubin: 0.3 mg/dL (ref 0.0–1.2)
Total Protein: 4.8 g/dL — ABNORMAL LOW (ref 6.5–8.1)

## 2024-05-30 LAB — MENINGITIS/ENCEPHALITIS PANEL (CSF)

## 2024-05-30 LAB — LIPID PANEL
Cholesterol: 157 mg/dL (ref 0–200)
HDL: 38 mg/dL — ABNORMAL LOW
LDL Cholesterol: 92 mg/dL (ref 0–99)
Total CHOL/HDL Ratio: 4.2 ratio
Triglycerides: 137 mg/dL
VLDL: 27 mg/dL (ref 0–40)

## 2024-05-30 LAB — PHOSPHORUS: Phosphorus: 2.3 mg/dL — ABNORMAL LOW (ref 2.5–4.6)

## 2024-05-30 LAB — MISC LABCORP TEST (SEND OUT): Labcorp test code: 2055

## 2024-05-30 LAB — PROTEIN AND GLUCOSE, CSF
Glucose, CSF: 76 mg/dL — ABNORMAL HIGH (ref 40–70)
Total  Protein, CSF: 76 mg/dL — ABNORMAL HIGH (ref 15–45)

## 2024-05-30 LAB — CBC
HCT: 37.5 % (ref 36.0–46.0)
Hemoglobin: 12.8 g/dL (ref 12.0–15.0)
MCH: 32.2 pg (ref 26.0–34.0)
MCHC: 34.1 g/dL (ref 30.0–36.0)
MCV: 94.2 fL (ref 80.0–100.0)
Platelets: 172 K/uL (ref 150–400)
RBC: 3.98 MIL/uL (ref 3.87–5.11)
RDW: 14.2 % (ref 11.5–15.5)
WBC: 10.7 K/uL — ABNORMAL HIGH (ref 4.0–10.5)
nRBC: 0 % (ref 0.0–0.2)

## 2024-05-30 LAB — ECHOCARDIOGRAM COMPLETE
AR max vel: 2.51 cm2
AV Peak grad: 12.7 mmHg
Ao pk vel: 1.78 m/s
Area-P 1/2: 2.5 cm2
Calc EF: 59.5 %
Height: 58 in
P 1/2 time: 352 ms
S' Lateral: 2.9 cm
Single Plane A2C EF: 61.6 %
Single Plane A4C EF: 58.3 %
Weight: 1876.56 [oz_av]

## 2024-05-30 LAB — AMMONIA: Ammonia: 32 umol/L (ref 9–35)

## 2024-05-30 LAB — HEMOGLOBIN A1C
Hgb A1c MFr Bld: 5.2 % (ref 4.8–5.6)
Mean Plasma Glucose: 102.54 mg/dL

## 2024-05-30 LAB — VZV PCR, CSF: VZV PCR, CSF: NEGATIVE

## 2024-05-30 LAB — MAGNESIUM: Magnesium: 2.6 mg/dL — ABNORMAL HIGH (ref 1.7–2.4)

## 2024-05-30 LAB — PATHOLOGIST SMEAR REVIEW

## 2024-05-30 LAB — LACTIC ACID, PLASMA: Lactic Acid, Venous: 0.9 mmol/L (ref 0.5–1.9)

## 2024-05-30 MED ORDER — ASPIRIN 300 MG RE SUPP
300.0000 mg | Freq: Every day | RECTAL | Status: DC
  Administered 2024-05-30: 300 mg via RECTAL
  Filled 2024-05-30: qty 1

## 2024-05-30 MED ORDER — SODIUM CHLORIDE 0.9% FLUSH
10.0000 mL | Freq: Two times a day (BID) | INTRAVENOUS | Status: DC
  Administered 2024-05-30: 40 mL
  Administered 2024-05-30 – 2024-06-02 (×6): 10 mL

## 2024-05-30 MED ORDER — IOHEXOL 350 MG/ML SOLN
75.0000 mL | Freq: Once | INTRAVENOUS | Status: AC | PRN
  Administered 2024-05-30: 75 mL via INTRAVENOUS

## 2024-05-30 MED ORDER — BOOST / RESOURCE BREEZE PO LIQD CUSTOM
1.0000 | Freq: Three times a day (TID) | ORAL | Status: DC
  Administered 2024-05-30 – 2024-06-01 (×4): 1 via ORAL
  Administered 2024-06-01: 237 mL via ORAL
  Administered 2024-06-01 – 2024-06-02 (×2): 1 via ORAL

## 2024-05-30 MED ORDER — ACETAMINOPHEN 10 MG/ML IV SOLN: 1000.0000 mg | Freq: Four times a day (QID) | INTRAVENOUS | Status: AC | PRN

## 2024-05-30 MED ORDER — SODIUM CHLORIDE 0.9% FLUSH: 10.0000 mL | INTRAVENOUS | Status: DC | PRN

## 2024-05-30 MED ORDER — ASPIRIN 81 MG PO CHEW: 81.0000 mg | CHEWABLE_TABLET | Freq: Every day | ORAL | Status: DC

## 2024-05-30 MED ORDER — ATORVASTATIN CALCIUM 40 MG PO TABS: 40.0000 mg | ORAL_TABLET | Freq: Every day | ORAL | Status: DC

## 2024-05-30 MED ORDER — POTASSIUM CHLORIDE 20 MEQ PO PACK
40.0000 meq | PACK | ORAL | Status: AC
  Administered 2024-05-30 (×2): 40 meq
  Filled 2024-05-30 (×2): qty 2

## 2024-05-30 MED ORDER — THIAMINE HCL 100 MG/ML IJ SOLN
500.0000 mg | Freq: Three times a day (TID) | INTRAVENOUS | Status: AC
  Administered 2024-05-30 – 2024-05-31 (×6): 500 mg via INTRAVENOUS
  Filled 2024-05-30 (×5): qty 5
  Filled 2024-05-30: qty 500
  Filled 2024-05-30: qty 5

## 2024-05-30 MED ORDER — PIPERACILLIN-TAZOBACTAM 3.375 G IVPB
3.3750 g | Freq: Three times a day (TID) | INTRAVENOUS | Status: DC
  Administered 2024-05-30 – 2024-05-31 (×4): 3.375 g via INTRAVENOUS
  Filled 2024-05-30 (×4): qty 50

## 2024-05-30 MED ORDER — THIAMINE HCL 100 MG/ML IJ SOLN: 250.0000 mg | Freq: Every day | INTRAVENOUS | Status: DC

## 2024-05-30 MED ORDER — POTASSIUM & SODIUM PHOSPHATES 280-160-250 MG PO PACK
2.0000 | PACK | ORAL | Status: AC
  Administered 2024-05-30: 2 via ORAL

## 2024-05-30 MED ORDER — POTASSIUM & SODIUM PHOSPHATES 280-160-250 MG PO PACK
2.0000 | PACK | ORAL | Status: DC
  Administered 2024-05-30 (×2): 2
  Filled 2024-05-30: qty 1
  Filled 2024-05-30 (×2): qty 2

## 2024-05-30 MED ORDER — ASPIRIN 300 MG RE SUPP: 300.0000 mg | Freq: Every day | RECTAL | Status: DC

## 2024-05-30 MED ORDER — HYDRALAZINE HCL 20 MG/ML IJ SOLN
5.0000 mg | INTRAMUSCULAR | Status: DC | PRN
  Administered 2024-05-30 – 2024-05-31 (×2): 10 mg via INTRAVENOUS
  Administered 2024-05-31: 5 mg via INTRAVENOUS
  Administered 2024-06-01 – 2024-06-02 (×4): 10 mg via INTRAVENOUS
  Filled 2024-05-30 (×7): qty 1

## 2024-05-30 MED ORDER — ATORVASTATIN CALCIUM 40 MG PO TABS
40.0000 mg | ORAL_TABLET | Freq: Every day | ORAL | Status: DC
  Administered 2024-05-30: 40 mg
  Filled 2024-05-30 (×2): qty 1

## 2024-05-30 MED ORDER — STROKE: EARLY STAGES OF RECOVERY BOOK
Freq: Once | Status: AC
  Administered 2024-05-31: 1
  Filled 2024-05-30: qty 1

## 2024-05-30 MED ORDER — ASPIRIN 81 MG PO CHEW
81.0000 mg | CHEWABLE_TABLET | Freq: Every day | ORAL | Status: DC
  Administered 2024-05-31: 81 mg
  Filled 2024-05-30: qty 1

## 2024-05-30 MED ORDER — THIAMINE HCL 100 MG/ML IJ SOLN: 100.0000 mg | Freq: Every day | INTRAMUSCULAR | Status: DC

## 2024-05-30 NOTE — Progress Notes (Signed)
 LTM maint complete - no skin breakdown under: T7, FP1

## 2024-05-30 NOTE — Progress Notes (Signed)
 SLP Cancellation Note  Patient Details Name: [name] MRN: [id number] DOB: 06/24/1960   Cancelled treatment:        Attempted to see pt for cognitive linguistic evaluation.  Pt is intubated at this time and unable to participate in assessment.  SLP will follow for medical readiness.  Consider swallow evaluation orders post extubation if indicated.   Anette FORBES Grippe, MA, CCC-SLP Acute Rehabilitation Services Office: [phone] 05/30/2024, 10:04 AM

## 2024-05-30 NOTE — Progress Notes (Signed)
 Returned from CT and was changed to PSV, sedation off  Has done very well w SBT with good pulm mechanics, and is awake, following commands, strong cough, and trying to talk.    Will proceed w extubation Dc sedation  Post extubation pulm hygiene   Ronnald Gave MSN, AGACNP-BC [facility] Pulmonary/Critical Care Medicine 05/30/2024, 12:40 PM

## 2024-05-30 NOTE — Progress Notes (Addendum)
 "  NAME:  [name], MRN:  [id number], DOB:  [birth year], LOS: 2 ADMISSION DATE:  05/28/2024, CONSULTATION DATE:  05/28/24 REFERRING MD:  Garrick CHIEF COMPLAINT:  AMS   History of Present Illness:  Pt is encephelopathic; therefore, this HPI is obtained from chart review. [name] is a 63 y.o. female who has a PMH as below. She presented to [facility] ED 05/28/24 via EMS for AMS, headaches, gait disturbance 2 days prior. Fiance noted AMS the night prior which did not resolve by day of presentation.  She came to ED where she was agitated, somewhat combative, confused, moaning non sensible words. CT head was negative. UDS positive for benzos and amphetamines. She was hypertensive to 190s systolic and tachycardic to 120s. She had fever to 101.2 rectally. She received Ativan  and Benadryl  without improvement.  On exam, she is profoundly hypovolemic and is being fluid resuscitated. Her pupils are dilated and are reactive to light. She has NO meningismus and I am able to flex/extend her neck and move it laterally without obvious resistance or noticeable discomfort.  Due to her AMS, PCCM asked to admit. At the time of our evaluation, she does not appear to have any ICU needs and appears stable for SDU admission. She did receive 20mg  Labetalol  with improvement in HR from 120s to 80s and SBP 190s to 180s.  Pertinent  Medical History:  has Hypothyroidism; GI bleed; Alcohol abuse; AKI (acute kidney injury); Protein-calorie malnutrition, severe; Essential hypertension; Lung nodules; COPD (chronic obstructive pulmonary disease) (HCC); Hypercalcemia; Macrocytosis; Orthostasis; Chronic abdominal pain; Insomnia; Cramps of lower extremity; Aortic atherosclerosis; GERD (gastroesophageal reflux disease); Benign hypertensive kidney disease with chronic kidney disease; Chronic low back pain (1ry area of Pain) (Bilateral) w/o sciatica; Stage 3 chronic kidney disease (HCC); Acute respiratory failure with hypoxia (HCC);  Chronic obstructive pulmonary disease (HCC); COPD with acute exacerbation (HCC); Acquired hypothyroidism; Insomnia due to other mental disorder; Lesion of epiglottis; Chronic pain syndrome; Pharmacologic therapy; Disorder of skeletal system; Problems influencing health status; Lumbar facet syndrome (Bilateral) (R>L); Chronic sacroiliac joint pain (Left); Abnormal drug screen (12/12/2019); Substance use disorder; Long term prescription benzodiazepine use; Chronic, continuous use of opioids; Amphetamine use; Intractable nausea and vomiting; Abdominal pain; UTI (urinary tract infection); Diarrhea; Gastroenteritis due to COVID-19 virus; Dehydration; Iron  deficiency anemia; Elevated lipase; Mixed hyperlipidemia; Severe sepsis (HCC); Pneumococcal pneumonia; Leg ulcer, left (HCC); GI bleeding; Heme positive stool; Acute gastric ulcer without hemorrhage or perforation; Duodenal ulcer; Duodenal stenosis; COPD exacerbation (HCC); and Acute metabolic encephalopathy on their problem list.  Significant Hospital Events: Including procedures, antibiotic start and stop dates in addition to other pertinent events   12/22 admit. 12/23 txf to TRH. Txf back to ICU   Interim History / Subjective:  Called back for ongoing agitation  Rvcd haldol  this morning   Fever overnight 101.5, no further fevers captured   LA 3 WBC 18 h/h 17.2/49   Objective:  Blood pressure (!) 160/71, pulse 72, temperature (!) 97.5 F (36.4 C), temperature source Axillary, resp. rate 14, height 4' 10 (1.473 m), weight 53.2 kg, SpO2 99%.    Vent Mode: PRVC FiO2 (%):  [40 %-100 %] 40 % Set Rate:  [16 bmp] 16 bmp Vt Set:  [330 mL] 330 mL PEEP:  [5 cmH20] 5 cmH20 Plateau Pressure:  [11 cmH20-13 cmH20] 13 cmH20   Intake/Output Summary (Last 24 hours) at 05/30/2024 0836 Last data filed at 05/30/2024 0700 Gross per 24 hour  Intake 6323.52 ml  Output 750 ml  Net 5573.52 ml   Filed Weights   05/28/24 0907 05/29/24 0558 05/30/24 0406   Weight: 50.3 kg 47.3 kg 53.2 kg     Physical Exam: General: chronically and acutely ill F  Neuro: Awake, lethargic. following BUE BLE commands, cough and sticks tongue out on commands. Pupils are round and reactive  HEENT: NCAT ETT secure anicteric sclera  Cardiovascular: rrr cap refill < 3 sec  Lungs: mechanically ventilated, symmetrical chest expansion  Abdomen: thin ndnt. + loose stool  MSK: no obvious acute joint deformity L forearm soft tissue edema near prior IV infiltration site  Skin: clean warm. 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  -CSF cx neg so far  -concern for possible PRES. Was hypertensive 170-200 prior to txf to ICU 12/23, rcvd hydral and later became hypotensive requiring pressors  -? Etoh encephalitis -? If profound dehydration may have accounted for bulk of her presentation and newly found small strokes are more incidental given her currently favorable exam  P -follow CSF. I am dc acyclovir , vanc ampicillin  and rocephin   based on cx. As detailed below, will be on zosyn  for alt reason  -neuro is following, appreciate recs  -cEEG  -for CTA head neck today  -secondary stroke workup, prevention per neuro (ECHO, asa,  -high dose thiamine  / taper)   Acute respiratory insufficiency 2/2 encephalopathy above Endotracheally intubated COPD Tobacco use disorder  P -cont MV support -WUA/SBT, wean as able -- neuro exam is quite good 12/24, will SBT when she gets back from CT, eval for possible extubation -PRN duoneb   Possible septic shock +/- hypovolemic shock  -unclear etiology, possible colitis on CT but  preceding sx not c/w this as far as we know. Some perinephric stranding but UA ok. Lungs pretty clear. CSF cx neg so far  P -follow bcx  -zosyn   -wean NE for MAP 65, cont IVF  -for ECHO 12/24   HTN / Hypotension  -some concern for possible PRES as etiology for strokes.  HTN was fairly labile, spiking w agitation spells and very sensitive to PRN hydral. Since, became abruptly hypotensive req IVF and pressors  -?hypotension as sedation related vs shock state  P -goal normotension -- NE/IVF vs PRN hydral for goal; variable needs   Possible colitis -CT w finding that might be c/w colitis. Is having loose stools. Doesn't sound like this was a presenting problem, and had not been notified of any loose stools prior to bowel reg. P -will change abx to zosyn  -dc bowel reg -cont IVF   Lactic acidosis P -follow LA PRN   Hx hypothyroidism -can defer IV synthroid  initiation 12/23.    Hyponatremia, mild Hypokalemia Hypophosphatemia Hypermagnesemia  P -kphos  -follow renal fxn and lytes   LUE IV infiltration -LR infiltration  P -supportive care elevate extremity   Labs   CBC: Recent Labs  Lab 05/28/24 0943 05/29/24 0453 05/29/24 1407 05/30/24 0504  WBC 15.9* 18.0*  --  10.7*  NEUTROABS 14.2*  --   --   --   HGB 18.0* 17.2* 13.9 12.8  HCT 52.1* 49.0* 41.0 37.5  MCV 93.0 91.1  --  94.2  PLT  327 284  --  172    Basic Metabolic Panel: Recent Labs  Lab 05/28/24 0943 05/29/24 0453 05/29/24 1407 05/29/24 1820 05/30/24 0336  NA 138 132* 133* 129* 134*  K 4.0 3.4* 2.7* 4.0 3.1*  CL 98 98  --  100 102  CO2 18* 15*  --  18* 22  GLUCOSE 155* 107*  --  104* 101*  BUN 16 15  --  17 17  CREATININE 0.97 0.88  --  0.90 0.84  CALCIUM  10.6* 9.2  --  7.4* 8.2*  MG  --  1.3*  --   --  2.6*  PHOS  --  2.3*  --   --  2.3*   GFR: Estimated Creatinine Clearance: 50.9 mL/min (by C-G formula based on SCr of 0.84 mg/dL). Recent Labs  Lab 05/28/24 0937 05/28/24 0943 05/28/24 1504  05/28/24 2108 05/29/24 0453 05/29/24 1527 05/30/24 0504  WBC  --  15.9*  --   --  18.0*  --  10.7*  LATICACIDVEN 4.1*  --  2.8* 3.0*  --  4.5*  --     Liver Function Tests: Recent Labs  Lab 05/28/24 0943 05/30/24 0336  AST 33 23  ALT 7 9  ALKPHOS 136* 68  BILITOT 0.5 0.3  PROT 7.9 4.8*  ALBUMIN 4.6 2.9*   No results for input(s): LIPASE, AMYLASE in the last 168 hours. Recent Labs  Lab 05/30/24 0336  AMMONIA 32    ABG    Component Value Date/Time   PHART 7.450 05/29/2024 1407   PCO2ART 31.4 (L) 05/29/2024 1407   PO2ART 202 (H) 05/29/2024 1407   HCO3 21.8 05/29/2024 1407   TCO2 23 05/29/2024 1407   ACIDBASEDEF 1.0 05/29/2024 1407   O2SAT 100 05/29/2024 1407     Coagulation Profile: No results for input(s): INR, PROTIME in the last 168 hours.  Cardiac Enzymes: Recent Labs  Lab 05/28/24 0943  CKTOTAL 141    HbA1C: Hgb A1c MFr Bld  Date/Time Value Ref Range Status  05/30/2024 05:04 AM 5.2 4.8 - 5.6 % Final    Comment:    (NOTE) Diagnosis of Diabetes The following HbA1c ranges recommended by the American Diabetes Association (ADA) may be used as an aid in the diagnosis of diabetes mellitus.  Hemoglobin             Suggested A1C NGSP%              Diagnosis  <5.7                   Non Diabetic  5.7-6.4                Pre-Diabetic  >6.4                   Diabetic  <7.0                   Glycemic control for                       adults with diabetes.      CBG: Recent Labs  Lab 05/28/24 0910  GLUCAP 159*    CRITICAL CARE Performed by: Ronnald FORBES Gave   Total critical care time: 43 minutes  Critical care time was exclusive of separately billable procedures and treating other patients. Critical care was necessary to treat or prevent imminent or life-threatening deterioration.  Critical care was time spent personally by me on the following  activities: development of treatment plan with patient and/or surrogate as well as nursing,  discussions with consultants, evaluation of patient's response to treatment, examination of patient, obtaining history from patient or surrogate, ordering and performing treatments and interventions, ordering and review of laboratory studies, ordering and review of radiographic studies, pulse oximetry and re-evaluation of patient's condition.  Ronnald Gave MSN, AGACNP-BC [facility] Amion for pager  05/30/2024, 8:36 AM     "

## 2024-05-30 NOTE — Progress Notes (Signed)
2D echo complete 

## 2024-05-30 NOTE — Progress Notes (Signed)
 Eeg machine is in room. RN will call when pt comes back from CT angio

## 2024-05-30 NOTE — Progress Notes (Signed)

## 2024-05-30 NOTE — Progress Notes (Signed)
 PT Cancellation Note  Patient Details Name: [name] MRN: [id number] DOB: [birth year]   Cancelled Treatment:    Reason Eval/Treat Not Completed: Patient not medically ready (intubated and await plans for extubation)   Reesha Debes B Tonee Silverstein 05/30/2024, 7:22 AM Lenoard SQUIBB, PT Acute Rehabilitation Services Office: [phone]

## 2024-05-30 NOTE — Progress Notes (Signed)
 [facility] ADULT ICU REPLACEMENT PROTOCOL   The patient does apply for the [facility] Adult ICU Electrolyte Replacment Protocol based on the criteria listed below:   1.Exclusion criteria: TCTS, ECMO, Dialysis, and Myasthenia Gravis patients 2. Is GFR >/= 30 ml/min? Yes.    Patient's GFR today is >60 3. Is SCr </= 2? Yes.   Patient's SCr is 0.84 mg/dL 4. Did SCr increase >/= 0.5 in 24 hours? No. 5.Pt's weight >40kg  Yes.   6. Abnormal electrolyte(s): K+ = 3.1, Phos = 2.3  7. Electrolytes replaced per protocol 8.  Call MD STAT for K+ </= 2.5, Phos </= 1, or Mag </= 1 Physician:  Haze, eMD   [name] 05/30/2024 5:33 AM

## 2024-05-30 NOTE — Procedures (Signed)
 Extubation Procedure Note  Patient Details:   Name: [name] DOB: [birth year] MRN: [id number]   Airway Documentation:    Vent end date: 05/30/24 Vent end time: 1252   Evaluation  O2 sats: stable throughout Complications: No apparent complications Patient did tolerate procedure well. Bilateral Breath Sounds: Diminished   Yes Pt was successfully extubated with no apparent complications at this time. Pt stable on 4L [place] and is tolerating well at this time.   Germain JAYSON Mater 05/30/2024, 12:53 PM

## 2024-05-30 NOTE — Consult Note (Signed)
 NEUROLOGY CONSULT NOTE   Date of service: May 30, 2024 Patient Name: [name] MRN:  [id number] DOB:  [birth year] Chief Complaint: encephalopathy, intubated, abnormal MRI brain Requesting Provider: Mannam, Praveen, MD  History of Present Illness  [name] is a 63 y.o. female with hx of COPD, tobacco use, GERD, hyperlipidemia, chronic pain syndrome, alcohol use disorder, hypothyroid, HTN who presented to the ED confused.  Unable to get hx from family, pt is intubated. Per chart review, became altered on the evening off 05/27/24 and complaining of a headache. She was improved in AM on 05/28/24 when EMS got there but became combative and given haldol  and Vered and brought in to the ED. She was moaning when she got to the ED and shortly thereafter, intubated for persistent agitation.  Pt was hypertensive and febrile in the ED with Tmax of 101.9. she was started on Vanc, ampicllin, Ceftriaxone , acyclovir . She had CT head w/o contrast which was negative for acute abnormalities. She had LP with mild pleocytosis, elevated protein. I requested lab to add Meningitis/encephalitis panel and is pending. CSF cultures so far are negative. Uds positive for benzos and emphetamines. However, was given Versed  by EMS.  She had MRI Brain w and w/o contrast which shows left parietal and occipital cortical infarcts. additional small acute infarcts in the posterior left frontal white matter and left basal ganglia. Mri also shows very extensive edema in BL cerebellum, medial thalami and in BL occipital lobes.  Neurology consulted for the noted strokes and posterior circulation edema.  LKW: evening of 05/27/24 Modified rankin score: unclear but presumed 0. IV Thrombolysis: not offered, outside window, high risk for ICH with concern for PRES. EVT: not offered, low suspicion for LVO  NIHSS components Score: Comment  1a Level of Conscious 0[]  1[]  2[x]  3[]      1b LOC Questions 0[]  1[]  2[x]       1c LOC  Commands 0[]  1[]  2[x]       2 Best Gaze 0[x]  1[]  2[]       3 Visual 0[x]  1[]  2[]  3[]      4 Facial Palsy 0[x]  1[]  2[]  3[]      5a Motor Arm - left 0[]  1[]  2[x]  3[]  4[]  UN[]    5b Motor Arm - Right 0[]  1[]  2[x]  3[]  4[]  UN[]    6a Motor Leg - Left 0[]  1[]  2[x]  3[]  4[]  UN[]    6b Motor Leg - Right 0[]  1[]  2[x]  3[]  4[]  UN[]    7 Limb Ataxia 0[x]  1[]  2[]  UN[]      8 Sensory 0[x]  1[]  2[]  UN[]      9 Best Language 0[]  1[]  2[]  3[x]      10 Dysarthria 0[]  1[]  2[]  UN[x]      11 Extinct. and Inattention 0[x]  1[]  2[]       TOTAL: 17      ROS  Unable to ascertain due to intubation and sedation.  Past History   Past Medical History:  Diagnosis Date   Acute diverticulitis 07/16/2013   Acute renal failure 07/16/2013   Arthritis    hands, back, knees   Colitis 07/16/2013   Diverticulitis    Elevated CEA 08/15/2017   GERD (gastroesophageal reflux disease)    GIB (gastrointestinal bleeding) 01/07/2012   Hypertension    somewhat controlled, taking medication   Hypothyroidism    Intractable nausea and vomiting 05/30/2022   Lesion of epiglottis    Macrocytosis without anemia 08/15/2017   Orthostasis 08/22/2017    Past Surgical History:  Procedure Laterality Date   ABDOMINAL  HYSTERECTOMY     APPENDECTOMY     BIOPSY  12/04/2022   Procedure: BIOPSY;  Surgeon: Albertus Gordy HERO, MD;  Location: [facility] ENDOSCOPY;  Service: Gastroenterology;;   CHOLECYSTECTOMY     COLONOSCOPY  01/09/2012   Procedure: COLONOSCOPY;  Surgeon: Jerrell KYM Sol, MD;  Location: [facility] ENDOSCOPY;  Service: Endoscopy;  Laterality: N/A;   COLONOSCOPY WITH PROPOFOL  N/A 08/25/2017   Procedure: COLONOSCOPY WITH PROPOFOL ;  Surgeon: Therisa Bi, MD;  Location: [facility] ENDOSCOPY;  Service: Gastroenterology;  Laterality: N/A;   ESOPHAGOGASTRODUODENOSCOPY  01/07/2012   Procedure: ESOPHAGOGASTRODUODENOSCOPY (EGD);  Surgeon: Lynwood LITTIE Celestia Mickey., MD;  Location: [facility] ENDOSCOPY;  Service: Endoscopy;  Laterality: N/A;   ESOPHAGOGASTRODUODENOSCOPY (EGD) WITH PROPOFOL  N/A  08/25/2017   Procedure: ESOPHAGOGASTRODUODENOSCOPY (EGD) WITH PROPOFOL ;  Surgeon: Therisa Bi, MD;  Location: [facility] ENDOSCOPY;  Service: Gastroenterology;  Laterality: N/A;   ESOPHAGOGASTRODUODENOSCOPY (EGD) WITH PROPOFOL  N/A 12/04/2022   Procedure: ESOPHAGOGASTRODUODENOSCOPY (EGD) WITH PROPOFOL ;  Surgeon: Albertus Gordy HERO, MD;  Location: [facility] ENDOSCOPY;  Service: Gastroenterology;  Laterality: N/A;   VIDEO BRONCHOSCOPY Bilateral 05/14/2016   Procedure: VIDEO BRONCHOSCOPY WITHOUT FLUORO;  Surgeon: Lamar GORMAN Chris, MD;  Location: [facility] ENDOSCOPY;  Service: Cardiopulmonary;  Laterality: Bilateral;    Family History: Family History  Problem Relation Age of Onset   Breast cancer Mother        multiple types of cancer   Stomach cancer Mother    Throat cancer Mother    Melanoma Mother    Kidney disease Father     Social History  reports that she has been smoking cigarettes. She started smoking about 50 years ago. She has a 84 pack-year smoking history. She has never used smokeless tobacco. She reports current alcohol use of about 35.0 standard drinks of alcohol per week. She reports that she does not use drugs.  Allergies[1]  Medications  Current Medications[2]  Vitals   Vitals:   05/30/24 0115 05/30/24 0130 05/30/24 0145 05/30/24 0200  BP: (!) 110/59 (!) 118/58 (!) 106/56 123/81  Pulse:    83  Resp:    16  Temp:      TempSrc:      SpO2:    99%  Weight:      Height:        Body mass index is 21.79 kg/m.   Physical Exam   General: Laying comfortably in bed; intubated and sedated on versed  and precedex . HENT: Normal oropharynx and mucosa. Normal external appearance of ears and nose.  Neck: Supple, no pain or tenderness  CV: No JVD. No peripheral edema.  Pulmonary: Symmetric Chest rise. Normal respiratory effort.  Abdomen: Soft to touch, non-tender.  Ext: No cyanosis, edema, or deformity  Skin: No rash. Normal palpation of skin.   Musculoskeletal: Normal digits and nails by  inspection. No clubbing.   Neurologic Examination  Mental status/Cognition: opens eyes to loud voice and tactile stimulation. Makes eye contact but does not follow commands. Speech/language: intubated, mute, does not follow commands. Cranial nerves:   CN II Pupils equal and reactive to light, unable to assess for VF deficits but does make brief eye contact.   CN III,IV,VI No gaze prefernce or deviation, EOMI grossly intact to dolls eyes but hard to say with patient actively resisting eye opening.   CN V Corneals intact BL   CN VII Symmetric facial grimace.   CN VIII Makes eye contact to speech   CN IX & X Cough and gag intact   CN XI Head is midline. Doe  turn head to left and right when attemping nares stimulation with a Qtip   CN XII Does not protrude tongue on command.   Sensory/Motor:  Muscle bulk: normal, tone normal. No jerking or twithcing noted concerning for seizures In restraints but to noxious stimuli, noted to have antigravity movements in BL uppers. Withdraws BL lowers to noxious stimuli and has antigravity movements in BL lowers.  Coordination/Complex Motor:  Unable to assess.  Labs/Imaging/Neurodiagnostic studies   CBC:  Recent Labs  Lab 06-07-24 0943 05/29/24 0453 05/29/24 1407  WBC 15.9* 18.0*  --   NEUTROABS 14.2*  --   --   HGB 18.0* 17.2* 13.9  HCT 52.1* 49.0* 41.0  MCV 93.0 91.1  --   PLT 327 284  --    Basic Metabolic Panel:  Lab Results  Component Value Date   NA 129 (L) 05/29/2024   K 4.0 05/29/2024   CO2 18 (L) 05/29/2024   GLUCOSE 104 (H) 05/29/2024   BUN 17 05/29/2024   CREATININE 0.90 05/29/2024   CALCIUM  7.4 (L) 05/29/2024   GFRNONAA >60 05/29/2024   GFRAA 49 (L) 01/02/2020   Lipid Panel:  Lab Results  Component Value Date   LDLCALC 75 12/13/2019   HgbA1c: No results found for: HGBA1C Urine Drug Screen:     Component Value Date/Time   LABOPIA NEGATIVE 2024-06-07 1203   COCAINSCRNUR NEGATIVE June 07, 2024 1203   COCAINSCRNUR NONE  DETECTED 11/11/2022 0340   LABBENZ POSITIVE (A) 2024/06/07 1203   AMPHETMU POSITIVE (A) 06/07/2024 1203   THCU NEGATIVE 06/07/24 1203   LABBARB NEGATIVE 06/07/2024 1203    Alcohol Level     Component Value Date/Time   ETH <15 Jun 07, 2024 1203   INR  Lab Results  Component Value Date   INR 1.0 03/17/2024   APTT  Lab Results  Component Value Date   APTT 41 (H) 11/09/2022   AED levels: No results found for: PHENYTOIN, ZONISAMIDE, LAMOTRIGINE, LEVETIRACETA  CT Head without contrast(Personally reviewed): CTH was negative for a large hypodensity concerning for a large territory infarct or hyperdensity concerning for an ICH  CT angio Head and Neck with contrast(Personally reviewed): pending  MRI Brain(Personally reviewed): 1. Acute left parietal and occipital cortical infarcts. Additional small acute infarcts in the posterior left frontal white matter and left basal ganglia. 2. Diffuse edema in the cerebellum bilaterally and also in the medial thalami, potentially due to posterior reversible encephalopathy syndrome (PRES), cerebellitis/encephalitis, or ADEM. Alcoholic encephalopathy is also a consideration for the thalamic findings.  Neurodiagnostics cEEG:  pending  ASSESSMENT   [name] is a 63 y.o. female with hx of COPD, tobacco use, GERD, hyperlipidemia, chronic pain syndrome, alcohol use disorder, hypothyroid, HTN who presented to the ED confused and combative and intubated. UDS positive for amphetamines. Neuro exam on sedation with versed  and precedex  is non focal. MRI brain with acute L parietal and occipital cortical infarcts al;ong with additional L frontal white matter and L basal ganglia infarct and noted diffuse edema in BL cerebellum, BL medial thalami. CSF with mild pleocytosis with negative CSF cultures and pending meningitis/encephalitis panel.  Differential is broad including PRES, ADEM, infectious/paraneoplastic and autommune encephalitis. I  suspect that her likely etiology is PRES or ?infectious encephalitis. Thou, not confirmed with family as I was unable to get in touch with them, she seems to have had a quick decline and per chart review, confusion started 2 days ago on the evening of 05/27/24. This would be odd for autoimmune/paraneoplastic process that  usually are subacute. She was also hypertensive on arrival. She was febrile and septic on arrival with no source identified yet and meningitis/encephalitis panel pending.  RECOMMENDATIONS  - continue Empiric meningitis coverage. - Thiamine  high dose replacement protocol. - meningitis/encephalitis panel added to remaining CSF and I spoke with lab over phone to get them running. - anti TPO and TGA antibody, Anti MOG ab, Autoimmnue encephalopathy panel, oligoclonal bands and IgG index. - cEEG in AM.  For noted strokes which I suspect are likely secondary to PRES: - Frequent Neuro checks per stroke unit protocol - Recommend brain imaging with MRI Brain without contrast - Recommend Vascular imaging with CTA head and neck - Recommend obtaining TTE  - Recommend obtaining Lipid panel with LDL - Please start statin if LDL > 70 - Recommend HbA1c to evaluate for diabetes and how well it is controlled. - Antithrombotic - aspirin  81mg  daily. - Recommend DVT ppx - SBP goal - aim for normotension. I suspect stroke  - Recommend Telemetry monitoring for arrythmia - Recommend bedside swallow screen prior to PO intake. - Stroke education booklet - Recommend PT/OT/SLP consult  ______________________________________________________________________  This patient is critically ill and at significant risk of neurological worsening, death and care requires constant monitoring of vital signs, hemodynamics,respiratory and cardiac monitoring, neurological assessment, discussion with family, other specialists and medical decision making of high complexity. I spent 60 minutes of neurocritical care time   in the care of  this patient. This was time spent independent of any time provided by nurse practitioner or PA.  Venie Montesinos [facility] 05/30/2024  4:32 AM   Signed, Vicky Schleich, MD [facility]     [1] No Known Allergies [2]  Current Facility-Administered Medications:    0.9 %  sodium chloride  infusion, 250 mL, Intravenous, Continuous, Paliwal, Aditya, MD, Last Rate: 10 mL/hr at 05/30/24 0200, Infusion Verify at 05/30/24 0200   acetaminophen  (OFIRMEV ) IV 1,000 mg, 1,000 mg, Intravenous, Q6H PRN, Bowser, Ronnald BRAVO, NP   acyclovir  (ZOVIRAX ) 500 mg in dextrose  5 % 100 mL IVPB, 500 mg, Intravenous, Q12H, Mannam, Praveen, MD, Stopped at 05/30/24 0044   ampicillin  (OMNIPEN) 2 g in sodium chloride  0.9 % 100 mL IVPB, 2 g, Intravenous, Q6H, Mannam, Praveen, MD, Stopped at 05/30/24 0007   cefTRIAXone  (ROCEPHIN ) 2 g in sodium chloride  0.9 % 100 mL IVPB, 2 g, Intravenous, Q12H, Mannam, Praveen, MD, Stopped at 05/29/24 1843   Chlorhexidine  Gluconate Cloth 2 % PADS 6 each, 6 each, Topical, Daily, Desai, Rahul P, PA-C, 6 each at 05/29/24 0827   dexmedetomidine  (PRECEDEX ) 400 MCG/100ML (4 mcg/mL) infusion, 0-1.2 mcg/kg/hr, Intravenous, Titrated, Bowser, Grace E, NP, Last Rate: 9.46 mL/hr at 05/30/24 0200, 0.8 mcg/kg/hr at 05/30/24 0200   docusate (COLACE) 50 MG/5ML liquid 100 mg, 100 mg, Per Tube, BID, Mannam, Praveen, MD, 100 mg at 05/29/24 2337   fentaNYL  (SUBLIMAZE ) bolus via infusion 25-100 mcg, 25-100 mcg, Intravenous, Q15 min PRN, Mannam, Praveen, MD   fentaNYL  in NS 250mL (10mcg/ml) infusion-PREMIX, 0-400 mcg/hr, Intravenous, Continuous, Mannam, Praveen, MD, Last Rate: 2.5 mL/hr at 05/30/24 0200, 25 mcg/hr at 05/30/24 0200   folic acid  injection 1 mg, 1 mg, Intravenous, Daily, Danton Purchase T, MD, 1 mg at 05/29/24 2338   hydrALAZINE  (APRESOLINE ) injection 10-20 mg, 10-20 mg, Intravenous, Q4H PRN, Bowser, Ronnald BRAVO, NP   ipratropium-albuterol  (DUONEB)  0.5-2.5 (3) MG/3ML nebulizer solution 3 mL, 3 mL, Nebulization, Q6H PRN, Bowser, Grace E, NP   labetalol  (NORMODYNE ) injection 5-10 mg, 5-10 mg,  Intravenous, Q2H PRN, Bowser, Grace E, NP, 10 mg at 05/29/24 1245   lactated ringers  infusion, , Intravenous, Continuous, Emmette Nidia NOVAK, RPH, Stopped at 05/29/24 1952   midazolam  PF (VERSED ) injection 1-2 mg, 1-2 mg, Intravenous, Q1H PRN, Bowser, Grace E, NP, 2 mg at 05/30/24 0207   nicotine  (NICODERM CQ  - dosed in mg/24 hours) patch 14 mg, 14 mg, Transdermal, Daily, Desai, Rahul P, PA-C, 14 mg at 05/29/24 0831   norepinephrine  (LEVOPHED ) 4mg  in (0.016 mg/mL) premix infusion, 0-10 mcg/min, Intravenous, Titrated, Paliwal, Aditya, MD, Stopped at 05/30/24 0102   Oral care mouth rinse, 15 mL, Mouth Rinse, Q2H, Bowser, Grace E, NP, 15 mL at 05/30/24 0153   Oral care mouth rinse, 15 mL, Mouth Rinse, PRN, Bowser, Grace E, NP   polyethylene glycol (MIRALAX  / GLYCOLAX ) packet 17 g, 17 g, Per Tube, Daily, Mannam, Praveen, MD   thiamine  (VITAMIN B1) injection 100 mg, 100 mg, Intravenous, Daily, Danton Purchase T, MD, 100 mg at 05/29/24 2337   vancomycin  (VANCOREADY) IVPB 500 mg/100 mL, 500 mg, Intravenous, Q12H, Mannam, Praveen, MD, Stopped at 05/30/24 [phone]

## 2024-05-30 NOTE — Progress Notes (Signed)
 EEG placement no skin breakdown, atrium is monitoring, push button pressed

## 2024-05-30 NOTE — Progress Notes (Signed)
 0030- This RN was contacted by [facility] asking to speak to the patients provider  regarding a critical MRI result. This RN provided [facility] with the contact information for the CCM provider on tonight through [facility].

## 2024-05-30 NOTE — Progress Notes (Signed)
 [id number] Kerns MD notified regarding patients HR sustaining in the 40s-50s with occasional drops into the 30s. No new orders at this time.

## 2024-05-30 NOTE — Progress Notes (Signed)
 OT Cancellation Note  Patient Details Name: [name] MRN: [id number] DOB: Aug 17, 1960   Cancelled Treatment:    Reason Eval/Treat Not Completed: Patient not medically ready currently intubating. Awaiting plans for potential extubation.   [name], OTR/L [facility] Acute Rehabilitation Office: [phone]   [name] 05/30/2024, 7:24 AM

## 2024-05-30 NOTE — Progress Notes (Signed)
 VAST consult received to place midline. Reached out to unit RN to determine reason for request (pt currently receiving vasopressors). Unit nurse reported patient has removed numerous IVs and needs a CTA. Educated unit nurse that CTA can be completed with midline which is a long PIV, but that a midline can be pulled out as easily as a shorter PIV; also educated that vasopressors and vesicants CANNOT be administered via midline. Advised that VAST RN will come assess patient's vasculature and place the most appropriate line for patient's current treatment.

## 2024-05-31 ENCOUNTER — Inpatient Hospital Stay (HOSPITAL_COMMUNITY)

## 2024-05-31 DIAGNOSIS — R4182 Altered mental status, unspecified: Secondary | ICD-10-CM | POA: Diagnosis not present

## 2024-05-31 DIAGNOSIS — R569 Unspecified convulsions: Secondary | ICD-10-CM | POA: Diagnosis not present

## 2024-05-31 DIAGNOSIS — I161 Hypertensive emergency: Secondary | ICD-10-CM

## 2024-05-31 DIAGNOSIS — R579 Shock, unspecified: Secondary | ICD-10-CM

## 2024-05-31 DIAGNOSIS — Z720 Tobacco use: Secondary | ICD-10-CM

## 2024-05-31 DIAGNOSIS — F1010 Alcohol abuse, uncomplicated: Secondary | ICD-10-CM

## 2024-05-31 DIAGNOSIS — I10 Essential (primary) hypertension: Secondary | ICD-10-CM | POA: Diagnosis not present

## 2024-05-31 DIAGNOSIS — G935 Compression of brain: Secondary | ICD-10-CM

## 2024-05-31 DIAGNOSIS — G0400 Acute disseminated encephalitis and encephalomyelitis, unspecified: Secondary | ICD-10-CM | POA: Diagnosis not present

## 2024-05-31 DIAGNOSIS — J449 Chronic obstructive pulmonary disease, unspecified: Secondary | ICD-10-CM

## 2024-05-31 DIAGNOSIS — I6783 Posterior reversible encephalopathy syndrome: Secondary | ICD-10-CM | POA: Diagnosis not present

## 2024-05-31 LAB — CBC
HCT: 41.5 % (ref 36.0–46.0)
Hemoglobin: 14.2 g/dL (ref 12.0–15.0)
MCH: 32.2 pg (ref 26.0–34.0)
MCHC: 34.2 g/dL (ref 30.0–36.0)
MCV: 94.1 fL (ref 80.0–100.0)
Platelets: 226 K/uL (ref 150–400)
RBC: 4.41 MIL/uL (ref 3.87–5.11)
RDW: 14.4 % (ref 11.5–15.5)
WBC: 12.2 K/uL — ABNORMAL HIGH (ref 4.0–10.5)
nRBC: 0 % (ref 0.0–0.2)

## 2024-05-31 LAB — BASIC METABOLIC PANEL WITH GFR
Anion gap: 22 — ABNORMAL HIGH (ref 5–15)
BUN: 16 mg/dL (ref 8–23)
CO2: 17 mmol/L — ABNORMAL LOW (ref 22–32)
Calcium: 8.9 mg/dL (ref 8.9–10.3)
Chloride: 101 mmol/L (ref 98–111)
Creatinine, Ser: 0.85 mg/dL (ref 0.44–1.00)
GFR, Estimated: >60 mL/min
Glucose, Bld: 54 mg/dL — ABNORMAL LOW (ref 70–99)
Potassium: 3.8 mmol/L (ref 3.5–5.1)
Sodium: 140 mmol/L (ref 135–145)

## 2024-05-31 MED ORDER — LOSARTAN POTASSIUM 50 MG PO TABS
25.0000 mg | ORAL_TABLET | Freq: Every day | ORAL | Status: DC
  Administered 2024-05-31: 25 mg via ORAL
  Filled 2024-05-31: qty 1

## 2024-05-31 MED ORDER — LEVOTHYROXINE SODIUM 88 MCG PO TABS
88.0000 ug | ORAL_TABLET | Freq: Every day | ORAL | Status: DC
  Administered 2024-05-31 – 2024-06-02 (×3): 88 ug via ORAL
  Filled 2024-05-31 (×3): qty 1

## 2024-05-31 MED ORDER — ENOXAPARIN SODIUM 40 MG/0.4ML IJ SOSY
40.0000 mg | PREFILLED_SYRINGE | Freq: Every day | INTRAMUSCULAR | Status: DC
  Administered 2024-05-31 – 2024-06-02 (×3): 40 mg via SUBCUTANEOUS
  Filled 2024-05-31 (×3): qty 0.4

## 2024-05-31 MED ORDER — PIPERACILLIN-TAZOBACTAM 3.375 G IVPB
3.3750 g | Freq: Three times a day (TID) | INTRAVENOUS | Status: DC
  Administered 2024-05-31 – 2024-06-02 (×6): 3.375 g via INTRAVENOUS
  Filled 2024-05-31 (×6): qty 50

## 2024-05-31 MED ORDER — ATORVASTATIN CALCIUM 40 MG PO TABS: 40.0000 mg | ORAL_TABLET | Freq: Every day | ORAL | Status: DC

## 2024-05-31 MED ORDER — ASPIRIN 81 MG PO CHEW
81.0000 mg | CHEWABLE_TABLET | Freq: Every day | ORAL | Status: DC
  Administered 2024-06-01 – 2024-06-02 (×2): 81 mg via ORAL
  Filled 2024-05-31 (×2): qty 1

## 2024-05-31 MED ORDER — ATORVASTATIN CALCIUM 40 MG PO TABS
40.0000 mg | ORAL_TABLET | Freq: Every day | ORAL | Status: DC
  Administered 2024-06-01 – 2024-06-02 (×2): 40 mg via ORAL
  Filled 2024-05-31 (×2): qty 1

## 2024-05-31 MED ORDER — FOLIC ACID 1 MG PO TABS
1.0000 mg | ORAL_TABLET | Freq: Every day | ORAL | Status: DC
  Administered 2024-06-01 – 2024-06-02 (×2): 1 mg via ORAL
  Filled 2024-05-31 (×2): qty 1

## 2024-05-31 MED ORDER — THIAMINE MONONITRATE 100 MG PO TABS
250.0000 mg | ORAL_TABLET | Freq: Every day | ORAL | Status: DC
  Administered 2024-06-01 – 2024-06-02 (×2): 250 mg via ORAL
  Filled 2024-05-31 (×2): qty 3

## 2024-05-31 MED ORDER — THIAMINE MONONITRATE 100 MG PO TABS: 100.0000 mg | ORAL_TABLET | Freq: Every day | ORAL | Status: DC

## 2024-05-31 NOTE — Plan of Care (Addendum)
 9177: Neurology provider Dr. Merrianne rounded on pt.  See provider note. 0825:  Dr. Theophilus rounded on pt.  See provider note.  1215:  Pt transported to 2W Room 01 via wheelchair.  Fiance, Billy, followed.  Billy in possession of pts cell phone, glasses and dentures.  Pt left in care of oncoming RN, Deana showing no signs of distress.  Problem: Education: Goal: Knowledge of General Education information will improve Description: Including pain rating scale, medication(s)/side effects and non-pharmacologic comfort measures 05/31/2024 0720 by Ofelia Richerd BROCKS, RN Outcome: Progressing 05/31/2024 0720 by Ofelia Richerd BROCKS, RN Outcome: Progressing   Problem: Health Behavior/Discharge Planning: Goal: Ability to manage health-related needs will improve 05/31/2024 0720 by Ofelia Richerd BROCKS, RN Outcome: Progressing 05/31/2024 0720 by Ofelia Richerd BROCKS, RN Outcome: Progressing   Problem: Clinical Measurements: Goal: Ability to maintain clinical measurements within normal limits will improve 05/31/2024 0720 by Ofelia Richerd BROCKS, RN Outcome: Progressing 05/31/2024 0720 by Ofelia Richerd BROCKS, RN Outcome: Progressing Goal: Will remain free from infection 05/31/2024 0720 by Ofelia Richerd BROCKS, RN Outcome: Progressing 05/31/2024 0720 by Ofelia Richerd BROCKS, RN Outcome: Progressing Goal: Diagnostic test results will improve 05/31/2024 0720 by Ofelia Richerd BROCKS, RN Outcome: Progressing 05/31/2024 0720 by Ofelia Richerd BROCKS, RN Outcome: Progressing Goal: Respiratory complications will improve 05/31/2024 0720 by Ofelia Richerd BROCKS, RN Outcome: Progressing 05/31/2024 0720 by Ofelia Richerd BROCKS, RN Outcome: Progressing Goal: Cardiovascular complication will be avoided 05/31/2024 0720 by Ofelia Richerd BROCKS, RN Outcome: Progressing 05/31/2024 0720 by Ofelia Richerd BROCKS, RN Outcome: Progressing   Problem: Activity: Goal: Risk for activity intolerance will  decrease 05/31/2024 0720 by Ofelia Richerd BROCKS, RN Outcome: Progressing 05/31/2024 0720 by Ofelia Richerd BROCKS, RN Outcome: Progressing   Problem: Nutrition: Goal: Adequate nutrition will be maintained 05/31/2024 0720 by Ofelia Richerd BROCKS, RN Outcome: Progressing 05/31/2024 0720 by Ofelia Richerd BROCKS, RN Outcome: Progressing   Problem: Coping: Goal: Level of anxiety will decrease 05/31/2024 0720 by Ofelia Richerd BROCKS, RN Outcome: Progressing 05/31/2024 0720 by Ofelia Richerd BROCKS, RN Outcome: Progressing   Problem: Elimination: Goal: Will not experience complications related to bowel motility 05/31/2024 0720 by Ofelia Richerd BROCKS, RN Outcome: Progressing 05/31/2024 0720 by Ofelia Richerd BROCKS, RN Outcome: Progressing Goal: Will not experience complications related to urinary retention 05/31/2024 0720 by Ofelia Richerd BROCKS, RN Outcome: Progressing 05/31/2024 0720 by Ofelia Richerd BROCKS, RN Outcome: Progressing   Problem: Pain Managment: Goal: General experience of comfort will improve and/or be controlled 05/31/2024 0720 by Ofelia Richerd BROCKS, RN Outcome: Progressing 05/31/2024 0720 by Ofelia Richerd BROCKS, RN Outcome: Progressing   Problem: Safety: Goal: Ability to remain free from injury will improve 05/31/2024 0720 by Ofelia Richerd BROCKS, RN Outcome: Progressing 05/31/2024 0720 by Ofelia Richerd BROCKS, RN Outcome: Progressing   Problem: Skin Integrity: Goal: Risk for impaired skin integrity will decrease 05/31/2024 0720 by Ofelia Richerd BROCKS, RN Outcome: Progressing 05/31/2024 0720 by Ofelia Richerd BROCKS, RN Outcome: Progressing   Problem: Activity: Goal: Ability to tolerate increased activity will improve 05/31/2024 0720 by Ofelia Richerd BROCKS, RN Outcome: Progressing 05/31/2024 0720 by Ofelia Richerd BROCKS, RN Outcome: Progressing   Problem: Respiratory: Goal: Ability to maintain a clear airway and adequate ventilation will  improve 05/31/2024 0720 by Ofelia Richerd BROCKS, RN Outcome: Progressing 05/31/2024 0720 by Ofelia Richerd BROCKS, RN Outcome: Progressing   Problem: Role Relationship: Goal: Method of communication will improve 05/31/2024 0720 by Ofelia Richerd BROCKS, RN Outcome: Progressing 05/31/2024 0720 by Ofelia Richerd BROCKS, RN Outcome: Progressing   Problem:  Safety: Goal: Non-violent Restraint(s) 05/31/2024 0720 by Ofelia Richerd BROCKS, RN Outcome: Progressing 05/31/2024 0720 by Ofelia Richerd BROCKS, RN Outcome: Progressing   Problem: Education: Goal: Knowledge of disease or condition will improve 05/31/2024 0720 by Ofelia Richerd BROCKS, RN Outcome: Progressing 05/31/2024 0720 by Ofelia Richerd BROCKS, RN Outcome: Progressing Goal: Knowledge of secondary prevention will improve (MUST DOCUMENT ALL) 05/31/2024 0720 by Ofelia Richerd BROCKS, RN Outcome: Progressing 05/31/2024 0720 by Ofelia Richerd BROCKS, RN Outcome: Progressing Goal: Knowledge of patient specific risk factors will improve (DELETE if not current risk factor) 05/31/2024 0720 by Ofelia Richerd BROCKS, RN Outcome: Progressing 05/31/2024 0720 by Ofelia Richerd BROCKS, RN Outcome: Progressing   Problem: Ischemic Stroke/TIA Tissue Perfusion: Goal: Complications of ischemic stroke/TIA will be minimized 05/31/2024 0720 by Ofelia Richerd BROCKS, RN Outcome: Progressing 05/31/2024 0720 by Ofelia Richerd BROCKS, RN Outcome: Progressing   Problem: Coping: Goal: Will verbalize positive feelings about self 05/31/2024 0720 by Ofelia Richerd BROCKS, RN Outcome: Progressing 05/31/2024 0720 by Ofelia Richerd BROCKS, RN Outcome: Progressing Goal: Will identify appropriate support needs Outcome: Progressing   Problem: Health Behavior/Discharge Planning: Goal: Ability to manage health-related needs will improve Outcome: Progressing Goal: Goals will be collaboratively established with patient/family Outcome: Progressing   Problem:  Self-Care: Goal: Ability to participate in self-care as condition permits will improve Outcome: Progressing Goal: Verbalization of feelings and concerns over difficulty with self-care will improve Outcome: Progressing Goal: Ability to communicate needs accurately will improve Outcome: Progressing   Problem: Nutrition: Goal: Risk of aspiration will decrease Outcome: Progressing Goal: Dietary intake will improve Outcome: Progressing

## 2024-05-31 NOTE — Progress Notes (Signed)
 "  NAME:  [name], MRN:  [id number], DOB:  1960/07/22, LOS: 3 ADMISSION DATE:  05/28/2024, CONSULTATION DATE:  05/28/24 REFERRING MD:  Garrick CHIEF COMPLAINT:  AMS   History of Present Illness:  Pt is encephelopathic; therefore, this HPI is obtained from chart review. [name] is a 63 y.o. female who has a PMH as below. She presented to [facility] ED 05/28/24 via EMS for AMS, headaches, gait disturbance 2 days prior. Fiance noted AMS the night prior which did not resolve by day of presentation.  She came to ED where she was agitated, somewhat combative, confused, moaning non sensible words. CT head was negative. UDS positive for benzos and amphetamines. She was hypertensive to 190s systolic and tachycardic to 120s. She had fever to 101.2 rectally. She received Ativan  and Benadryl  without improvement.  On exam, she is profoundly hypovolemic and is being fluid resuscitated. Her pupils are dilated and are reactive to light. She has NO meningismus and I am able to flex/extend her neck and move it laterally without obvious resistance or noticeable discomfort.  Due to her AMS, PCCM asked to admit. At the time of our evaluation, she does not appear to have any ICU needs and appears stable for SDU admission. She did receive 20mg  Labetalol  with improvement in HR from 120s to 80s and SBP 190s to 180s.  Pertinent  Medical History:  has Hypothyroidism; GI bleed; Alcohol abuse; AKI (acute kidney injury); Protein-calorie malnutrition, severe; Essential hypertension; Lung nodules; COPD (chronic obstructive pulmonary disease) (HCC); Hypercalcemia; Macrocytosis; Orthostasis; Chronic abdominal pain; Insomnia; Cramps of lower extremity; Aortic atherosclerosis; GERD (gastroesophageal reflux disease); Benign hypertensive kidney disease with chronic kidney disease; Chronic low back pain (1ry area of Pain) (Bilateral) w/o sciatica; Stage 3 chronic kidney disease (HCC); Acute respiratory failure with hypoxia (HCC);  Chronic obstructive pulmonary disease (HCC); COPD with acute exacerbation (HCC); Acquired hypothyroidism; Insomnia due to other mental disorder; Lesion of epiglottis; Chronic pain syndrome; Pharmacologic therapy; Disorder of skeletal system; Problems influencing health status; Lumbar facet syndrome (Bilateral) (R>L); Chronic sacroiliac joint pain (Left); Abnormal drug screen (12/12/2019); Substance use disorder; Long term prescription benzodiazepine use; Chronic, continuous use of opioids; Amphetamine use; Intractable nausea and vomiting; Abdominal pain; UTI (urinary tract infection); Diarrhea; Gastroenteritis due to COVID-19 virus; Dehydration; Iron  deficiency anemia; Elevated lipase; Mixed hyperlipidemia; Severe sepsis (HCC); Pneumococcal pneumonia; Leg ulcer, left (HCC); GI bleeding; Heme positive stool; Acute gastric ulcer without hemorrhage or perforation; Duodenal ulcer; Duodenal stenosis; COPD exacerbation (HCC); and Acute metabolic encephalopathy on their problem list.  Significant Hospital Events: Including procedures, antibiotic start and stop dates in addition to other pertinent events   12/22 admit. 12/23 transferred to ICU, intubated, LP and MRI performed 12/24 extubated   Interim History / Subjective:   Extubated yesterday without issue.  Has remained afebrile.  Mental status is improved.  Objective:  Blood pressure (!) 143/75, pulse (!) 110, temperature 98.3 F (36.8 C), temperature source Oral, resp. rate 16, height 4' 10 (1.473 m), weight 49.3 kg, SpO2 100%.    Vent Mode: PSV;CPAP FiO2 (%):  [40 %] 40 % PEEP:  [5 cmH20] 5 cmH20 Pressure Support:  [5 cmH20-8 cmH20] 5 cmH20   Intake/Output Summary (Last 24 hours) at 05/31/2024 0946 Last data filed at 05/31/2024 0400 Gross per 24 hour  Intake 461.29 ml  Output 2400 ml  Net -1938.71 ml   Filed Weights   05/29/24 0558 05/30/24 0406 05/31/24 0500  Weight: 47.3 kg 53.2 kg 49.3 kg   Physical  Exam: Awake, responsive with no  focal deficits Pupils equal and reactive Heart rate regular rate and rhythm Lungs are clear Extremities with no edema  Resolved   Metabolic acidosis  Lactic acidosis  Assessment & Plan:  Acute encephalopathy- unclear etiology and likely multifactorial  Possible encephalitis / meningitis  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  Likely etiology is PRES in the setting of amphetamine use..  Patient admits to taking ecstasy pill from her coworker. CSF studies are unremarkable Taken off meningitis coverage.  Continue Zosyn  for 7 days Further stroke workup per neurology  Acute respiratory insufficiency 2/2 encephalopathy above Endotracheally intubated COPD Tobacco use disorder  P Stable postextubation As needed nebs  Possible septic shock +/- hypovolemic shock  -unclear etiology, possible colitis on CT but preceding sx not c/w this as far as we know. Some perinephric stranding but UA ok. Lungs pretty clear. CSF cx neg so far  P Improved.  Follow cultures Zosyn   HTN / Hypotension  -some concern for possible PRES as etiology for strokes.  HTN was fairly labile, spiking w agitation spells and very sensitive to PRN hydral. Since, became abruptly hypotensive req IVF and pressors  -?hypotension as sedation related vs shock state  P -goal normotension  Restart home losartan   Possible colitis -CT w finding that might be c/w colitis. Is having loose stools. Doesn't sound like this was a presenting problem, and had not been notified of any loose stools prior to bowel reg. P Antibiotics as above  Hx hypothyroidism - Restart home Synthroid   LUE IV infiltration -LR infiltration  P -supportive care elevate extremity   Stable for transfer out of ICU  Signature:   [name]  MD [facility] See Amion for pager  If no response to pager , please call [phone] until 7pm After 7:00 pm call Elink  [phone] 05/31/2024, 9:55 AM   "

## 2024-05-31 NOTE — Procedures (Addendum)
 Patient Name: [name]  MRN: [id number]  Epilepsy Attending: Arlin MALVA Krebs  Referring Physician/Provider: Khaliqdina, Salman, MD Duration: 05/31/2023 9045 to 05/31/2024 1025  Patient history: 63yo F with ams. EEG to evaluate for seizure  Level of alertness: Awake, asleep  AEDs during EEG study: None  Technical aspects: This EEG study was done with scalp electrodes positioned according to the 10-20 International system of electrode placement. Electrical activity was reviewed with band pass filter of 1-70Hz , sensitivity of 7 uV/mm, display speed of 100mm/sec with a 60Hz  notched filter applied as appropriate. EEG data were recorded continuously and digitally stored.  Video monitoring was available and reviewed as appropriate.  Description: The posterior dominant rhythm consists of 7 Hz activity of moderate voltage (25-35 uV) seen predominantly in posterior head regions, symmetric and reactive to eye opening and eye closing. Sleep was characterized by vertex waves, sleep spindles (12 to 14 Hz), maximal frontocentral region.  There is continuous low amplitude 3 to 6 Hz theta-delta slowing in left hemisphere. Hyperventilation and photic stimulation were not performed.     ABNORMALITY - Continuous slow, left hemisphere - Background slow  IMPRESSION: This study is suggestive of cortical dysfunction arising from left hemisphere likely secondary to underlying structural abnormality. Additionally there is generalized cerebral dysfunction (encephalopathy). No seizures or epileptiform discharges were seen throughout the recording.  Wyatte Dames O Keelin Neville

## 2024-05-31 NOTE — Progress Notes (Signed)
 NEUROLOGY CONSULT FOLLOW UP NOTE   Date of service: May 31, 2024 Patient Name: [name] MRN:  [id number] DOB:  March 10, 1961  Interval Hx/subjective  States that she has not been using any amphetamines. After discussing with the patient her positive amphetamine result on UDS, she states that someone at work gave her a pill to help her have a better day at work. 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   Vitals   Vitals:   05/31/24 0500 05/31/24 0600 05/31/24 0700 05/31/24 0800  BP: 137/61 (!) 145/61 130/64   Pulse: (!) 105 (!) 108 (!) 101 (!) 115  Resp: 16 17 (!) 26 (!) 33  Temp:    98.3 F (36.8 C)  TempSrc:    Oral  SpO2: 100% 99% 100% 99%  Weight: 49.3 kg     Height:         Body mass index is 22.72 kg/m.  Physical Exam   Constitutional: Appears frail and older than stated age Psych: Affect is mildly dysthymic Eyes: No scleral injection.  HENT: No OP obstrucion.  Head: Normocephalic. EEG leads are in place Respiratory: Effort normal, non-labored breathing.  Ext: No edema  Neurologic Examination   Mental Status: Awake and alert. Oriented x 5. Speech fluent with intact naming and comprehension.  Cranial Nerves: II: Visual fields intact bilaterally. No extinction to DSS.  Pupils in ambient light: Right pupil is slightly ovoid and 3 mm. Left pupil round and 3 mm Pupil reactivity: Both pupils constrict to 2 mm with penlight III,IV, VI: No ptosis. EOMI. No nystagmus. VII: Smile symmetric VIII: Hearing intact to conversation IX,X: Mild hoarseness  XI: Symmetric XII: Midline tongue extension Motor: Right : Upper extremity   5/5    Left:     Upper extremity   5/5  Lower extremity   5/5     Lower extremity   5/5 Sensory: Light touch intact throughout, bilaterally Deep Tendon Reflexes: 2+ and symmetric throughout Cerebellar: No ataxia with FNF bilaterally, but has difficulty targeting with RUE and becomes confused when asked to point with her right index finger, not  her whole hand Gait: Deferred   Medications Current Medications[1]  Labs and Diagnostic Imaging   CBC:  Recent Labs  Lab 05/28/24 0943 05/29/24 0453 05/29/24 1407 05/30/24 0504  WBC 15.9* 18.0*  --  10.7*  NEUTROABS 14.2*  --   --   --   HGB 18.0* 17.2* 13.9 12.8  HCT 52.1* 49.0* 41.0 37.5  MCV 93.0 91.1  --  94.2  PLT 327 284  --  172    Basic Metabolic Panel:  Lab Results  Component Value Date   NA 134 (L) 05/30/2024   K 3.1 (L) 05/30/2024   CO2 22 05/30/2024   GLUCOSE 101 (H) 05/30/2024   BUN 17 05/30/2024   CREATININE 0.84 05/30/2024   CALCIUM  8.2 (L) 05/30/2024   GFRNONAA >60 05/30/2024   GFRAA 49 (L) 01/02/2020   Lipid Panel:  Lab Results  Component Value Date   LDLCALC 92 05/30/2024   HgbA1c:  Lab Results  Component Value Date   HGBA1C 5.2 05/30/2024   Urine Drug Screen:     Component Value Date/Time   LABOPIA NEGATIVE 05/28/2024 1203   COCAINSCRNUR NEGATIVE 05/28/2024 1203   COCAINSCRNUR NONE DETECTED 11/11/2022 0340   LABBENZ POSITIVE (A) 05/28/2024 1203   AMPHETMU POSITIVE (A) 05/28/2024 1203   THCU NEGATIVE 05/28/2024 1203   LABBARB NEGATIVE 05/28/2024 1203    Alcohol  Level     Component Value Date/Time   [employer] <15 05/28/2024 1203   INR  Lab Results  Component Value Date   INR 1.0 03/17/2024   APTT  Lab Results  Component Value Date   APTT 41 (H) 11/09/2022   CT Head without contrast: 1. No acute intracranial abnormality. 2. Scattered periventricular and subcortical white matter low-density changes compatible with chronic microvascular ischemic change. 3. Small remote lacunar infarct in the left basal ganglia.   CT angio Head and Neck with contrast: 1. Expected CT appearance of the brain with scattered hypodensity. No hemorrhagic transformation or intracranial mass effect. CT appearance of the left lateral occipital lobe infarct has progressed over the past 2 days. 2. Negative for large vessel occlusion. Widespread aortic  arch and carotid atherosclerosis. But no hemodynamically significant Large vessel stenosis. There is a moderate Right PCA P2 stenosis. 3. But positive for general hyper-dynamic appearance of left hemisphere branch enhancement (series 15 image 18), suggesting left hemisphere hyper perfusion (such as can be seen with seizure activity).    MRI Brain (Personally reviewed): 1. Acute left parietal and occipital cortical infarcts. Additional small acute infarcts in the posterior left frontal white matter and left basal ganglia. 2. Diffuse edema in the cerebellum bilaterally and also in the medial thalami, potentially due to posterior reversible encephalopathy syndrome (PRES), cerebellitis/encephalitis, or ADEM. Alcoholic encephalopathy is also a consideration for the thalamic findings.  TTE: 1. Left ventricular ejection fraction, by estimation, is 65 to 70%. The  left ventricle has normal function. The left ventricle has no regional  wall motion abnormalities. Left ventricular diastolic parameters are  consistent with Grade I diastolic dysfunction (impaired relaxation).   2. Right ventricular systolic function is normal. The right ventricular  size is normal. There is mildly elevated pulmonary artery systolic  pressure. The estimated right ventricular systolic pressure is 40.3 mmHg.   3. Left atrial size was mildly dilated.   4. The mitral valve is normal in structure. Trivial mitral valve  regurgitation. No evidence of mitral stenosis.   5. The aortic valve is tricuspid. There is mild calcification of the  aortic valve. Aortic valve regurgitation is moderate. No aortic stenosis  is present. Aortic regurgitation PHT measures 352 msec.   6. The inferior vena cava is normal in size with <50% respiratory  variability, suggesting right atrial pressure of 8 mmHg.   Assessment  63 y.o. female with a PMHx of COPD, tobacco use, GERD, hyperlipidemia, chronic pain syndrome, alcohol use disorder,  hypothyroid and HTN who presented to the ED in the morning on 12/22 confused and combative. Fiance said that the patient had complained of staggering gait 2 days prior to presenting and that the night before presenting she had become altered and had complained of a headache. When he woke up, he said she was moaning and not responding to his questions so he called EMS. Upon EMS arrival the patient became combative and was given Haldol  5 mg and Versed  5 mg IM. UDS positive for benzodiazepine and amphetamine. LP was performed, revealing CSF with mild pleocytosis in the context of a bloody tap, with negative CSF cultures and negative meningitis/encephalitis PCR panel. - Exam today reveals an awake, alert and oriented female appearing greater than her stated age. She has no visual field cut, but did have targeting deficit on the right with FNF. No clinical seizure-like activity appreciated.  - LTM EEG report for this morning: Continuous slow, left hemisphere; Background slow. This study is suggestive  of cortical dysfunction arising from left hemisphere likely secondary to underlying structural abnormality. Additionally there is generalized cerebral dysfunction (encephalopathy). No seizures or epileptiform discharges were seen throughout the recording. - CTA of head and neck: Expected CT appearance of the brain with scattered hypodensity. No hemorrhagic transformation or intracranial mass effect. CT appearance of the left lateral occipital lobe infarct has progressed over the past 2 days. Negative for large vessel occlusion. Widespread aortic arch and carotid atherosclerosis. But no hemodynamically significant Large vessel stenosis. There is a moderate Right PCA P2 stenosis. But positive for general hyper-dynamic appearance of left hemisphere branch enhancement (series 15 image 18), suggesting left hemisphere hyper perfusion (such as can be seen with seizure activity). - Differential on initial presentation was  broad, including PRES, ADEM, paraneoplastic and autommune encephalitis. 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 Her rapid decline would be atypical for an autoimmune/paraneoplastic process. She was also hypertensive on arrival, which would be more consistent with PRES.   Recommendations  For PRES versus autoimmune encephalitis versus severe metabolic syndrome (thiamine  deficiency, severe hypoglycemia): - Thiamine  high dose replacement protocol. - Discontinuing LTM EEG - Anti TPO and TGA antibody, Anti MOG ab, Autoimmnue encephalopathy panel, oligoclonal bands and IgG index. - Monitor CBG.   For noted strokes which we suspect are most likely secondary to PRES: - Frequent Neuro checks per stroke unit protocol - Antithrombotic - aspirin  81mg  daily. - Recommend DVT ppx - Recommend Telemetry monitoring for arrythmia - Recommend bedside swallow screen prior to PO intake. - Recommend PT/OT/SLP consult ______________________________________________________________________   [name], Maria Burkman, MD [facility]     [1]  Current Facility-Administered Medications:    acetaminophen  (OFIRMEV ) IV 1,000 mg, 1,000 mg, Intravenous, Q6H PRN, Mannam, Praveen, MD   aspirin  chewable tablet 81 mg, 81 mg, Per Tube, Daily, 81 mg at 05/31/24 0901 **OR** aspirin  suppository 300 mg, 300 mg, Rectal, Daily, Paytes, Austin A, RPH   atorvastatin  (LIPITOR) tablet 40 mg, 40 mg, Per Tube, q1800, Paytes, Austin A, RPH, 40 mg at 05/30/24 1753   Chlorhexidine  Gluconate Cloth 2 % PADS 6 each, 6 each, Topical, Daily, Desai, Rahul P, PA-C, 6 each at 05/30/24 9060   feeding supplement (BOOST / RESOURCE BREEZE) liquid 1 Container, 1 Container, Oral, TID BM, Mannam, Praveen, MD, 1 Container at 05/31/24 0901   folic acid  injection 1 mg, 1 mg, Intravenous, Daily, Danton Reyes DASEN, MD, 1 mg at 05/30/24 1641   hydrALAZINE  (APRESOLINE ) injection 5-10 mg, 5-10  mg, Intravenous, Q4H PRN, Bowser, Ronnald BRAVO, NP, 10 mg at 05/31/24 0406   ipratropium-albuterol  (DUONEB) 0.5-2.5 (3) MG/3ML nebulizer solution 3 mL, 3 mL, Nebulization, Q6H PRN, Bowser, Ronnald BRAVO, NP   lactated ringers  infusion, , Intravenous, Continuous, Salam, Savannah B, RPH, Stopped at 05/29/24 1952   nicotine  (NICODERM CQ  - dosed in mg/24 hours) patch 14 mg, 14 mg, Transdermal, Daily, Desai, Rahul P, PA-C, 14 mg at 05/31/24 9096   Oral care mouth rinse, 15 mL, Mouth Rinse, Q2H, Bowser, Grace E, NP, 15 mL at 05/31/24 0831   Oral care mouth rinse, 15 mL, Mouth Rinse, PRN, Bowser, Grace E, NP   piperacillin -tazobactam (ZOSYN ) IVPB 3.375 g, 3.375 g, Intravenous, Q8H, Bowser, Grace E, NP, Last Rate: 12.5 mL/hr at 05/31/24 0902, 3.375 g at 05/31/24 0902   sodium chloride  flush (NS) 0.9 % injection 10-40 mL, 10-40 mL, Intracatheter, Q12H, Mannam, Praveen, MD, 10 mL at 05/30/24 2132   sodium chloride   flush (NS) 0.9 % injection 10-40 mL, 10-40 mL, Intracatheter, PRN, Mannam, Praveen, MD   thiamine  (VITAMIN B1) 500 mg in sodium chloride  0.9 % 50 mL IVPB, 500 mg, Intravenous, Q8H, Last Rate: 110 mL/hr at 05/31/24 0552, 500 mg at 05/31/24 0552 **FOLLOWED BY** [START ON 06/01/2024] thiamine  (VITAMIN B1) 250 mg in sodium chloride  0.9 % 50 mL IVPB, 250 mg, Intravenous, Daily **FOLLOWED BY** [START ON 06/07/2024] thiamine  (VITAMIN B1) injection 100 mg, 100 mg, Intravenous, Daily, Khaliqdina, Salman, MD

## 2024-05-31 NOTE — Progress Notes (Signed)
 LTM EEG discontinued - no skin breakdown at [facility].

## 2024-05-31 NOTE — TOC Initial Note (Signed)
 Transition of Care [facility]) - Initial/Assessment Note    Patient Details  Name: [name] MRN: [id number] Date of Birth: [birth year]  Transition of Care [facility]) CM/SW Contact:    Lauraine FORBES Saa, LCSWA Phone Number: 05/31/2024, 3:03 PM  Clinical Narrative:                  3:03 PM CSW introduced self and role to patient. Patient confirmed she resides at home with her significant other, [name]. Patient confirmed that she does not have SNF or HH history. Patient informed CSW that she does not use DME, including nebulizer machine, at home. Patient confirmed PCP and insurance coverage. [facility] confirmed [facility] [phone] [place]. No TOC needs identified at this time. TOC will continue to followl.  Expected Discharge Plan: Home/Self Care Barriers to Discharge: Continued Medical Work up   Patient Goals and CMS Choice            Expected Discharge Plan and Services       Living arrangements for the past 2 months: Single Family Home                                      Prior Living Arrangements/Services Living arrangements for the past 2 months: Single Family Home Lives with:: Significant Other Patient language and need for interpreter reviewed:: Yes        Need for Family Participation in Patient Care: No (Comment)     Criminal Activity/Legal Involvement Pertinent to Current Situation/Hospitalization: No - Comment as needed  Activities of Daily Living      Permission Sought/Granted Permission sought to share information with : Family Supports Permission granted to share information with : No (Contact information)  Share Information with NAME: [name]     Permission granted to share info w Relationship: Significant Other  Permission granted to share info w Contact Information: [phone]  Emotional Assessment Appearance:: Appears stated age Attitude/Demeanor/Rapport: Engaged Affect (typically observed): Adaptable, Accepting,  Appropriate, Calm, Stable, Pleasant Orientation: : Oriented to Self, Oriented to Place, Oriented to  Time, Oriented to Situation Alcohol / Substance Use: Not Applicable Psych Involvement: No (comment)  Admission diagnosis:  Acute febrile illness [R50.9] Acute encephalopathy [G93.40] Severe sepsis (HCC) [A41.9, R65.20] Acute metabolic encephalopathy [G93.41] Patient Active Problem List   Diagnosis Date Noted   Acute metabolic encephalopathy 05/28/2024   COPD exacerbation (HCC) 03/16/2024   Acute gastric ulcer without hemorrhage or perforation 12/04/2022   Duodenal ulcer 12/04/2022   Duodenal stenosis 12/04/2022   GI bleeding 12/03/2022   Heme positive stool 12/03/2022   Leg ulcer, left (HCC) 11/14/2022   Pneumococcal pneumonia 11/10/2022   Severe sepsis (HCC) 11/09/2022   Intractable nausea and vomiting 05/30/2022   Abdominal pain 05/30/2022   UTI (urinary tract infection) 05/30/2022   Diarrhea 05/30/2022   Gastroenteritis due to COVID-19 virus 05/30/2022   Dehydration 05/30/2022   Iron  deficiency anemia 05/30/2022   Elevated lipase 05/30/2022   Mixed hyperlipidemia 05/30/2022   Abnormal drug screen (12/12/2019) 01/13/2020   Substance use disorder 01/13/2020   Long term prescription benzodiazepine use 01/13/2020   Chronic, continuous use of opioids 01/13/2020   Amphetamine use 01/13/2020   Lumbar facet syndrome (Bilateral) (R>L) 12/12/2019   Chronic sacroiliac joint pain (Left) 12/12/2019   Chronic pain syndrome 12/11/2019   Pharmacologic therapy 12/11/2019   Disorder of skeletal system 12/11/2019   Problems influencing health status  12/11/2019   Benign hypertensive kidney disease with chronic kidney disease 11/07/2019   Insomnia 09/12/2019   Cramps of lower extremity 09/12/2019   Aortic atherosclerosis 09/12/2019   GERD (gastroesophageal reflux disease) 09/12/2019   Acute respiratory failure with hypoxia (HCC) 06/05/2019   COPD with acute exacerbation (HCC) 06/05/2019    Acquired hypothyroidism 06/05/2019   Chronic low back pain (1ry area of Pain) (Bilateral) w/o sciatica 08/16/2018   Stage 3 chronic kidney disease (HCC) 08/16/2018   Insomnia due to other mental disorder 08/16/2018   Lesion of epiglottis 08/10/2018   Chronic abdominal pain 01/03/2018   Orthostasis 08/22/2017   Hypercalcemia 08/15/2017   Macrocytosis 08/15/2017   Lung nodules 05/07/2016   COPD (chronic obstructive pulmonary disease) (HCC) 05/07/2016   Chronic obstructive pulmonary disease (HCC) 05/07/2016   Essential hypertension 07/16/2013   Protein-calorie malnutrition, severe 01/12/2013   AKI (acute kidney injury) 01/11/2013   GI bleed 01/07/2012   Alcohol abuse 01/07/2012   Hypothyroidism 03/03/2010   PCP:  [facility]:   [id number] [address], [place] - [address] [place] [address] Phone: [phone] Fax: [phone]     Social Drivers of Health (SDOH) Social History: SDOH Screenings   Food Insecurity: No Food Insecurity (12/03/2022)  Housing: Low Risk (12/03/2022)  Transportation Needs: No Transportation Needs (12/03/2022)  Utilities: At Risk (12/03/2022)  Tobacco Use: High Risk (05/28/2024)   SDOH Interventions:     Readmission Risk Interventions    12/05/2022    8:09 AM 11/11/2022   11:36 AM  Readmission Risk Prevention Plan  Transportation Screening Complete Complete  Medication Review [employer]) Complete Complete  PCP or Specialist appointment within 3-5 days of discharge Complete Complete  HRI or Home Care Consult Complete   SW Recovery Care/Counseling Consult Complete Complete  Palliative Care Screening Not Applicable Not Applicable  Skilled Nursing Facility Not Applicable Not Applicable

## 2024-05-31 NOTE — Progress Notes (Signed)
 LTM maint complete - no skin breakdown under: Q2,Q6,J7

## 2024-06-01 ENCOUNTER — Other Ambulatory Visit: Payer: Self-pay

## 2024-06-01 DIAGNOSIS — G0400 Acute disseminated encephalitis and encephalomyelitis, unspecified: Secondary | ICD-10-CM | POA: Diagnosis not present

## 2024-06-01 DIAGNOSIS — I10 Essential (primary) hypertension: Secondary | ICD-10-CM | POA: Diagnosis not present

## 2024-06-01 DIAGNOSIS — G9341 Metabolic encephalopathy: Secondary | ICD-10-CM | POA: Diagnosis not present

## 2024-06-01 DIAGNOSIS — I6783 Posterior reversible encephalopathy syndrome: Secondary | ICD-10-CM | POA: Diagnosis not present

## 2024-06-01 LAB — BASIC METABOLIC PANEL WITH GFR
Anion gap: 15 (ref 5–15)
BUN: 10 mg/dL (ref 8–23)
CO2: 23 mmol/L (ref 22–32)
Calcium: 9.4 mg/dL (ref 8.9–10.3)
Chloride: 103 mmol/L (ref 98–111)
Creatinine, Ser: 0.77 mg/dL (ref 0.44–1.00)
GFR, Estimated: >60 mL/min
Glucose, Bld: 81 mg/dL (ref 70–99)
Potassium: 3.3 mmol/L — ABNORMAL LOW (ref 3.5–5.1)
Sodium: 141 mmol/L (ref 135–145)

## 2024-06-01 LAB — CBC
HCT: 38.9 % (ref 36.0–46.0)
Hemoglobin: 13.2 g/dL (ref 12.0–15.0)
MCH: 31.8 pg (ref 26.0–34.0)
MCHC: 33.9 g/dL (ref 30.0–36.0)
MCV: 93.7 fL (ref 80.0–100.0)
Platelets: 271 K/uL (ref 150–400)
RBC: 4.15 MIL/uL (ref 3.87–5.11)
RDW: 14 % (ref 11.5–15.5)
WBC: 10.3 K/uL (ref 4.0–10.5)
nRBC: 0 % (ref 0.0–0.2)

## 2024-06-01 LAB — LEGIONELLA PNEUMOPHILA SEROGP 1 UR AG: L. pneumophila Serogp 1 Ur Ag: NEGATIVE

## 2024-06-01 LAB — CYTOLOGY - NON PAP

## 2024-06-01 MED ORDER — AMLODIPINE BESYLATE 5 MG PO TABS
5.0000 mg | ORAL_TABLET | Freq: Every day | ORAL | Status: DC
  Administered 2024-06-01 – 2024-06-02 (×2): 5 mg via ORAL
  Filled 2024-06-01 (×2): qty 1

## 2024-06-01 MED ORDER — POTASSIUM CHLORIDE 20 MEQ PO PACK
40.0000 meq | PACK | Freq: Two times a day (BID) | ORAL | Status: AC
  Administered 2024-06-01 (×2): 40 meq via ORAL
  Filled 2024-06-01 (×2): qty 2

## 2024-06-01 MED ORDER — LOSARTAN POTASSIUM 50 MG PO TABS
50.0000 mg | ORAL_TABLET | Freq: Every day | ORAL | Status: DC
  Administered 2024-06-01 – 2024-06-02 (×2): 50 mg via ORAL
  Filled 2024-06-01 (×2): qty 1

## 2024-06-01 NOTE — Progress Notes (Signed)
 " PROGRESS NOTE    [name]  FMW:[id number] DOB: 02-22-1961 DOA: 05/28/2024 PCP: [facility]   Brief Narrative:    [name] is a 63 y.o. female who has a PMH as below. She presented to [facility] ED 05/28/24 via EMS for AMS, headaches, gait disturbance 2 days prior. Fiance noted AMS the night prior which did not resolve by day of presentation.   She came to ED where she was agitated, somewhat combative, confused, moaning non sensible words. CT head was negative. UDS positive for benzos and amphetamines. She was hypertensive to 190s systolic and tachycardic to 120s. She had fever to 101.2 rectally. She received Ativan  and Benadryl  without improvement.  On exam she was profoundly hypovolemic and fluid resuscitated. Maintenance IV coverage was provided on admission.  Patient was severely agitated.  She was moved to ICU, intubated for LP on 12/23, extubated 12/24.  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 Likely etiology is PRES in the setting of amphetamine use..  Patient admits to taking ecstasy pill from her coworker. CSF studies are unremarkable Taken off meningitis coverage.  Currently receiving Zosyn  with concern for some colitis.  Culture negative. Patient currently alert and oriented, conversant, ambulatory.   Acute respiratory insufficiency 2/2 encephalopathy above Endotracheally intubated COPD Tobacco use disorder  P Stable postextubation As needed nebs   Possible septic shock +/- hypovolemic shock  -unclear etiology, possible colitis on CT but preceding  sx not c/w this as far as we know. Some perinephric stranding but UA ok. Lungs pretty clear. CSF cx neg so far  P Improved.  Follow cultures Zosyn    HTN / Hypotension  -some concern for possible PRES as etiology for strokes.  HTN was fairly labile, spiking w agitation spells and very sensitive to PRN hydral. Since, became abruptly hypotensive req IVF and pressors   - Blood pressure today 190 systolic Will increase losartan  to 50 mg daily and add amlodipine  5 mg daily  Possible colitis -CT w finding that might be c/w colitis. Is having loose stools. Doesn't sound like this was a presenting problem, and had not been notified of any loose stools prior to bowel reg. P Antibiotics as above   Hx hypothyroidism - Restart home Synthroid    Lovenox  for DVT prophylaxis Full code  Subjective:  Patient seen and examined at the bedside.  Alert and oriented not in any acute distress.  Wants to go home.  Blood pressure elevated more than 190s.  `  Objective: Vitals:   06/01/24 0907 06/01/24 1000 06/01/24 1230 06/01/24 1307  BP: (!) 191/83 (!) 145/84 (!) 181/81 (!) 181/81  Pulse:      Resp:      Temp:      TempSrc:      SpO2:      Weight:      Height:        Intake/Output Summary (Last 24 hours) at 06/01/2024 1432 Last data filed at 06/01/2024 0557 Gross per 24 hour  Intake 929.25 ml  Output --  Net 929.25 ml   Filed Weights   05/30/24 0406 05/31/24 0500 06/01/24 0446  Weight: 53.2  kg 49.3 kg 47.5 kg    Examination:  General: Alert, oriented, anxious Chest: Clear CVs: S1, S2, no murmur, regular rhythm Abdomen: Soft, nontender   Data Reviewed: I have personally reviewed following labs and imaging studies  CBC: Recent Labs  Lab 05/28/24 0943 05/29/24 0453 05/29/24 1407 05/30/24 0504 05/31/24 1009 06/01/24 0555  WBC 15.9* 18.0*  --  10.7* 12.2* 10.3  NEUTROABS 14.2*  --   --   --   --   --   HGB 18.0* 17.2* 13.9 12.8 14.2 13.2  HCT 52.1* 49.0* 41.0 37.5 41.5 38.9   MCV 93.0 91.1  --  94.2 94.1 93.7  PLT 327 284  --  172 226 271   Basic Metabolic Panel: Recent Labs  Lab 05/29/24 0453 05/29/24 1407 05/29/24 1820 05/30/24 0336 05/31/24 1009 06/01/24 0555  NA 132* 133* 129* 134* 140 141  K 3.4* 2.7* 4.0 3.1* 3.8 3.3*  CL 98  --  100 102 101 103  CO2 15*  --  18* 22 17* 23  GLUCOSE 107*  --  104* 101* 54* 81  BUN 15  --  17 17 16 10   CREATININE 0.88  --  0.90 0.84 0.85 0.77  CALCIUM  9.2  --  7.4* 8.2* 8.9 9.4  MG 1.3*  --   --  2.6*  --   --   PHOS 2.3*  --   --  2.3*  --   --    GFR: Estimated Creatinine Clearance: 47.7 mL/min (by C-G formula based on SCr of 0.77 mg/dL). Liver Function Tests: Recent Labs  Lab 05/28/24 0943 05/30/24 0336  AST 33 23  ALT 7 9  ALKPHOS 136* 68  BILITOT 0.5 0.3  PROT 7.9 4.8*  ALBUMIN 4.6 2.9*   No results for input(s): LIPASE, AMYLASE in the last 168 hours. Recent Labs  Lab 05/30/24 0336  AMMONIA 32   Coagulation Profile: No results for input(s): INR, PROTIME in the last 168 hours. Cardiac Enzymes: Recent Labs  Lab 05/28/24 0943  CKTOTAL 141   BNP (last 3 results) No results for input(s): PROBNP in the last 8760 hours. HbA1C: Recent Labs    05/30/24 0504  HGBA1C 5.2   CBG: Recent Labs  Lab 05/28/24 0910  GLUCAP 159*   Lipid Profile: Recent Labs    05/30/24 0504  CHOL 157  HDL 38*  LDLCALC 92  TRIG 862  CHOLHDL 4.2   Thyroid  Function Tests: No results for input(s): TSH, T4TOTAL, FREET4, T3FREE, THYROIDAB in the last 72 hours. Anemia Panel: No results for input(s): VITAMINB12, FOLATE, FERRITIN, TIBC, IRON , RETICCTPCT in the last 72 hours. Sepsis Labs: Recent Labs  Lab 05/28/24 1504 05/28/24 2108 05/29/24 1527 05/30/24 0940  LATICACIDVEN 2.8* 3.0* 4.5* 0.9    Recent Results (from the past 240 hours)  Blood Culture (Routine X 2)     Status: None (Preliminary result)   Collection Time: 05/28/24  9:43 AM   Specimen: BLOOD  Result  Value Ref Range Status   Specimen Description BLOOD SITE NOT SPECIFIED  Final   Special Requests   Final    BOTTLES DRAWN AEROBIC ONLY Blood Culture results may not be optimal due to an inadequate volume of blood received in culture bottles   Culture   Final    NO GROWTH 4 DAYS Performed at [facility], [address] [place], [place] [address]    Report Status PENDING  Incomplete  Resp panel by RT-PCR (RSV, Flu A&B, Covid)  Anterior Nasal Swab     Status: None   Collection Time: 05/28/24 10:07 AM   Specimen: Anterior Nasal Swab  Result Value Ref Range Status   SARS Coronavirus 2 by RT PCR NEGATIVE NEGATIVE Final   Influenza A by PCR NEGATIVE NEGATIVE Final   Influenza B by PCR NEGATIVE NEGATIVE Final    Comment: (NOTE) The Xpert Xpress SARS-CoV-2/FLU/RSV plus assay is intended as an aid in the diagnosis of influenza from Nasopharyngeal swab specimens and should not be used as a sole basis for treatment. Nasal washings and aspirates are unacceptable for Xpert Xpress SARS-CoV-2/FLU/RSV testing.  Fact Sheet for Patients: bloggercourse.com  Fact Sheet for Healthcare Providers: seriousbroker.it  This test is not yet approved or cleared by the [place]  FDA and has been authorized for detection and/or diagnosis of SARS-CoV-2 by FDA under an Emergency Use Authorization (EUA). This EUA will remain in effect (meaning this test can be used) for the duration of the COVID-19 declaration under Section 564(b)(1) of the Act, 21 U.S.C. section 360bbb-3(b)(1), unless the authorization is terminated or revoked.     Resp Syncytial Virus by PCR NEGATIVE NEGATIVE Final    Comment: (NOTE) Fact Sheet for Patients: bloggercourse.com  Fact Sheet for Healthcare Providers: seriousbroker.it  This test is not yet approved or cleared by the [place]  FDA and has been authorized for  detection and/or diagnosis of SARS-CoV-2 by FDA under an Emergency Use Authorization (EUA). This EUA will remain in effect (meaning this test can be used) for the duration of the COVID-19 declaration under Section 564(b)(1) of the Act, 21 U.S.C. section 360bbb-3(b)(1), unless the authorization is terminated or revoked.  Performed at [facility], [address], [place], [place] [address]   MRSA Next Gen by PCR, Nasal     Status: None   Collection Time: 05/28/24  5:00 PM   Specimen: Nasal Mucosa; Nasal Swab  Result Value Ref Range Status   MRSA by PCR Next Gen NOT DETECTED NOT DETECTED Final    Comment: (NOTE) The GeneXpert MRSA Assay (FDA approved for NASAL specimens only), is one component of a comprehensive MRSA colonization surveillance program. It is not intended to diagnose MRSA infection nor to guide or monitor treatment for MRSA infections. Test performance is not FDA approved in patients less than 25 years old. Performed at [facility], [address], [place], [place] [address]   Blood Culture (Routine X 2)     Status: None (Preliminary result)   Collection Time: 05/28/24  9:08 PM   Specimen: BLOOD  Result Value Ref Range Status   Specimen Description BLOOD SITE NOT SPECIFIED  Final   Special Requests   Final    BOTTLES DRAWN AEROBIC AND ANAEROBIC Blood Culture results may not be optimal due to an inadequate volume of blood received in culture bottles   Culture   Final    NO GROWTH 4 DAYS Performed at [facility], [address], [place], [place] [address]    Report Status PENDING  Incomplete  CSF culture w Gram Stain     Status: None (Preliminary result)   Collection Time: 05/29/24 10:54 AM   Specimen: CSF; Cerebrospinal Fluid  Result Value Ref Range Status   Specimen Description CSF  Final   Special Requests LUMBAR PUNCTURE  Final   Gram Stain   Final    WBC PRESENT, PREDOMINANTLY PMN NO ORGANISMS SEEN CYTOSPIN SMEAR    Culture   Final  NO GROWTH 3 DAYS Performed at [facility], [address], [place], [place] [address]    Report Status PENDING  Incomplete  VZV PCR, CSF     Status: None   Collection Time: 05/29/24 10:54 AM   Specimen: Cerebrospinal Fluid  Result Value Ref Range Status   VZV PCR, CSF Negative Negative Final    Comment: (NOTE) No Varicella Zoster Virus DNA detected. Performed At: [facility] [address] [place], [place] [id number] Jennette Shorter MD Ey:[id number]   Culture, fungus without smear     Status: None (Preliminary result)   Collection Time: 05/29/24 10:54 AM   Specimen: CSF; Cerebrospinal Fluid  Result Value Ref Range Status   Specimen Description CSF  Final   Special Requests Normal  Final   Culture   Final    NO FUNGUS ISOLATED AFTER 2 DAYS Performed at [facility], [address] [place], [place] [address]    Report Status PENDING  Incomplete         Radiology Studies: Overnight EEG with video Result Date: 05/31/2024 Shelton Arlin KIDD, MD     05/31/2024 12:28 PM Patient Name: ADITRI LOUISCHARLES MRN: [id number] Epilepsy Attending: Arlin KIDD Shelton Referring Physician/Provider: Khaliqdina, Salman, MD Duration: 05/31/2023 0954 to 05/31/2024 1025 Patient history: 63yo F with ams. EEG to evaluate for seizure Level of alertness: Awake, asleep AEDs during EEG study: None Technical aspects: This EEG study was done with scalp electrodes positioned according to the 10-20 International system of electrode placement. Electrical activity was reviewed with band pass filter of 1-70Hz , sensitivity of 7 uV/mm, display speed of 38mm/sec with a 60Hz  notched filter applied as appropriate. EEG data were recorded continuously and digitally stored.  Video monitoring was available and reviewed as appropriate. Description: The posterior dominant rhythm consists of 7 Hz activity of moderate voltage (25-35 uV) seen predominantly in posterior head regions, symmetric and reactive to eye  opening and eye closing. Sleep was characterized by vertex waves, sleep spindles (12 to 14 Hz), maximal frontocentral region.  There is continuous low amplitude 3 to 6 Hz theta-delta slowing in left hemisphere. Hyperventilation and photic stimulation were not performed.   ABNORMALITY - Continuous slow, left hemisphere - Background slow IMPRESSION: This study is suggestive of cortical dysfunction arising from left hemisphere likely secondary to underlying structural abnormality. Additionally there is generalized cerebral dysfunction (encephalopathy). No seizures or epileptiform discharges were seen throughout the recording. Priyanka O Yadav        Scheduled Meds:  amLODipine   5 mg Oral Daily   aspirin   81 mg Oral Daily   atorvastatin   40 mg Oral Daily   Chlorhexidine  Gluconate Cloth  6 each Topical Daily   enoxaparin  (LOVENOX ) injection  40 mg Subcutaneous Daily   feeding supplement  1 Container Oral TID BM   folic acid   1 mg Oral Daily   levothyroxine   88 mcg Oral Q0600   losartan   50 mg Oral Daily   nicotine   14 mg Transdermal Daily   mouth rinse  15 mL Mouth Rinse Q2H   potassium chloride   40 mEq Oral BID   sodium chloride  flush  10-40 mL Intracatheter Q12H   thiamine   250 mg Oral Daily   Followed by   NOREEN ON 06/07/2024] thiamine   100 mg Oral Daily   Continuous Infusions:  lactated ringers  125 mL/hr at 06/01/24 0842   piperacillin -tazobactam (ZOSYN )  IV 3.375 g (06/01/24 0845)  Derryl Duval, MD [facility] 06/01/2024, 2:32 PM   "

## 2024-06-01 NOTE — Plan of Care (Signed)

## 2024-06-01 NOTE — Evaluation (Signed)
 Physical Therapy Evaluation Patient Details Name: [name] MRN: [id number] DOB: 1960/09/02 Today's Date: 06/01/2024  History of Present Illness  63 yo F adm 05/28/24 combative, HA with AMS. Intubated in ED. 12/23 LP. MRI(+) L parietal, frontal, basal ganglia and Occipital cortical infarcts with extensive edema in Bil cerebellum, medial thalami and Bil occipital lobes. 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 She lives with her fiance and dog in a single story home with 5 steps to enter. During evaluation, completed all mobility tasks IND with no need for DME. HR remained elevated throughout session 114-118, education on PLB and calming strategies. Pt does not have any additional PT needs at this time; PT will sign off. Seated: BP 153/74, HR 114 Standing: BP 145/84, HR 118      If plan is discharge home, recommend the following:     Can travel by private vehicle        Equipment Recommendations None recommended by PT  Recommendations for Other Services       Functional Status Assessment       Precautions / Restrictions Precautions Precautions: None Restrictions Weight Bearing Restrictions Per Provider Order: No      Mobility  Bed Mobility Overal bed mobility: Independent                  Transfers Overall transfer level: Independent Equipment used: None                    Ambulation/Gait Ambulation/Gait assistance: Independent Gait Distance (Feet): 500 Feet Assistive device: None   Gait velocity: normal        Stairs            Wheelchair Mobility     Tilt Bed    Modified Rankin (Stroke Patients Only)       Balance Overall balance assessment: Independent                                           Pertinent  Vitals/Pain Pain Assessment Pain Assessment: No/denies pain    Home Living Family/patient expects to be discharged to:: Private residence Living Arrangements: Spouse/significant other Available Help at Discharge: Family Type of Home: Mobile home Home Access: Stairs to enter Entrance Stairs-Rails: Left;Right;Can reach both Entrance Stairs-Number of Steps: 5   Home Layout: One level Home Equipment: [place] - single point Additional Comments: lives with finance,    Prior Function Prior Level of Function : Independent/Modified Independent;Working/employed;Driving;History of Falls (last six months)             Mobility Comments: works FT as a financial risk analyst, ADLs Comments: IND with ADLs/IADLs     Extremity/Trunk Assessment   Upper Extremity Assessment Upper Extremity Assessment: Defer to OT evaluation    Lower Extremity Assessment Lower Extremity Assessment: Overall WFL for tasks assessed    Cervical / Trunk Assessment Cervical / Trunk Assessment: Normal  Communication   Communication Communication: No apparent difficulties    Cognition Arousal: Alert Behavior During Therapy: WFL for tasks assessed/performed   PT - Cognitive impairments: No apparent impairments  Following commands: Intact       Cueing Cueing Techniques: Verbal cues     General Comments      Exercises     Assessment/Plan    PT Assessment Patient does not need any further PT services  PT Problem List         PT Treatment Interventions      PT Goals (Current goals can be found in the Care Plan section)  Acute Rehab PT Goals Patient Stated Goal: go home PT Goal Formulation: With patient Time For Goal Achievement: 06/08/24 Potential to Achieve Goals: Good    Frequency       Co-evaluation               AM-PAC PT 6 Clicks Mobility  Outcome Measure Help needed turning from your back to your side while in a flat bed without using bedrails?: None Help  needed moving from lying on your back to sitting on the side of a flat bed without using bedrails?: None Help needed moving to and from a bed to a chair (including a wheelchair)?: None Help needed standing up from a chair using your arms (e.g., wheelchair or bedside chair)?: None Help needed to walk in hospital room?: None Help needed climbing 3-5 steps with a railing? : None 6 Click Score: 24    End of Session   Activity Tolerance: Patient tolerated treatment well Patient left: in bed Nurse Communication: Mobility status PT Visit Diagnosis: Muscle weakness (generalized) (M62.81)    Time: 9048-8989 PT Time Calculation (min) (ACUTE ONLY): 19 min   Charges:   PT Evaluation $PT Eval Low Complexity: 1 Low PT Treatments $Gait Training: 8-22 mins PT General Charges $$ ACUTE PT VISIT: 1 Visit         Isaiah H. Kiah Keay, PT, DPT   [employer] 06/01/2024, 10:14 AM

## 2024-06-01 NOTE — Plan of Care (Signed)
" °  Problem: Pain Managment: Goal: General experience of comfort will improve and/or be controlled Outcome: Progressing   Problem: Role Relationship: Goal: Method of communication will improve Outcome: Progressing   Problem: Nutrition: Goal: Dietary intake will improve Outcome: Progressing   "

## 2024-06-01 NOTE — Progress Notes (Addendum)
 NEUROLOGY CONSULT FOLLOW UP NOTE   Date of service: June 01, 2024 Patient Name: [name] MRN:  [id number] DOB:  Feb 17, 1961  Interval Hx/subjective   States she is 100% back to normal an eager to go home. Has a PCP appt next week. No focal deficit noted on exam. She has been ambulating in her room without difficulty  Vitals   Vitals:   05/31/24 1835 05/31/24 2343 06/01/24 0446 06/01/24 0537  BP: 130/84 (!) 142/76  (!) 168/88  Pulse: (!) 101 61  97  Resp: 16 18  18   Temp: 97.6 F (36.4 C) 97.8 F (36.6 C)  97.7 F (36.5 C)  TempSrc: Oral Oral  Oral  SpO2: 100% 98%  100%  Weight:   47.5 kg   Height:         Body mass index is 21.89 kg/m.  Physical Exam   Constitutional: Appears frail and older than stated age Psych: Affect is mildly dysthymic Eyes: No scleral injection.  HENT: No OP obstrucion.  Head: Normocephalic. EEG leads are in place Respiratory: Effort normal, non-labored breathing.  Ext: No edema  Neurologic Examination   Mental Status: Awake and alert. Oriented x 5. Speech fluent with intact naming and comprehension.  Cranial Nerves: II: Visual fields intact bilaterally. No extinction to DSS. PERRL 3mm bilaterally.  III,IV, VI: No ptosis. EOMI. No nystagmus. VII: Smile symmetric VIII: Hearing intact to conversation IX,X: Mild hoarseness  XI: Symmetric XII: Midline tongue extension Motor: Right : Upper extremity   5/5    Left:     Upper extremity   5/5  Lower extremity   5/5     Lower extremity   5/5 Sensory: Light touch intact throughout, bilaterally Deep Tendon Reflexes: 2+ and symmetric throughout Cerebellar: No ataxia with FNF or HKS Gait: Gait steady   Medications Current Medications[1]  Labs and Diagnostic Imaging   CBC:  Recent Labs  Lab 05/28/24 0943 05/29/24 0453 05/31/24 1009 06/01/24 0555  WBC 15.9*   < > 12.2* 10.3  NEUTROABS 14.2*  --   --   --   HGB 18.0*   < > 14.2 13.2  HCT 52.1*   < > 41.5 38.9  MCV 93.0   < >  94.1 93.7  PLT 327   < > 226 271   < > = values in this interval not displayed.    Basic Metabolic Panel:  Lab Results  Component Value Date   NA 141 06/01/2024   K 3.3 (L) 06/01/2024   CO2 23 06/01/2024   GLUCOSE 81 06/01/2024   BUN 10 06/01/2024   CREATININE 0.77 06/01/2024   CALCIUM  9.4 06/01/2024   GFRNONAA >60 06/01/2024   GFRAA 49 (L) 01/02/2020   Lipid Panel:  Lab Results  Component Value Date   LDLCALC 92 05/30/2024   HgbA1c:  Lab Results  Component Value Date   HGBA1C 5.2 05/30/2024   Urine Drug Screen:     Component Value Date/Time   LABOPIA NEGATIVE 05/28/2024 1203   COCAINSCRNUR NEGATIVE 05/28/2024 1203   COCAINSCRNUR NONE DETECTED 11/11/2022 0340   LABBENZ POSITIVE (A) 05/28/2024 1203   AMPHETMU POSITIVE (A) 05/28/2024 1203   THCU NEGATIVE 05/28/2024 1203   LABBARB NEGATIVE 05/28/2024 1203    Alcohol Level     Component Value Date/Time   [facility] <15 05/28/2024 1203   INR  Lab Results  Component Value Date   INR 1.0 03/17/2024   APTT  Lab Results  Component Value Date  APTT 41 (H) 11/09/2022   Lab Results  Component Value Date   TUBENUMBER 1 05/29/2024   TUBENUMBER 4 05/29/2024   COLORCSF PINK (A) 05/29/2024   COLORCSF PINK (A) 05/29/2024   APPEARCSF TURBID (A) 05/29/2024   APPEARCSF HAZY (A) 05/29/2024   SUPERNATCSF COLORLESS 05/29/2024   SUPERNATCSF COLORLESS 05/29/2024   RBCCOUNTCSF 3,000 (H) 05/29/2024   RBCCOUNTCSF 885 (H) 05/29/2024   WBCCSF 45 (HH) 05/29/2024   WBCCSF 9 (H) 05/29/2024   EOSCSF 0 05/29/2024    Lab Results  Component Value Date   GLUCCSF 76 (H) 05/29/2024   PROTEINCSF 76 (H) 05/29/2024    Meningitis encephalitis panel - Pending   Cytology - In process  CT Head without contrast: 1. No acute intracranial abnormality. 2. Scattered periventricular and subcortical white matter low-density changes compatible with chronic microvascular ischemic change. 3. Small remote lacunar infarct in the left basal  ganglia.   CT angio Head and Neck with contrast: 1. Expected CT appearance of the brain with scattered hypodensity. No hemorrhagic transformation or intracranial mass effect. CT appearance of the left lateral occipital lobe infarct has progressed over the past 2 days.  2. Negative for large vessel occlusion. Widespread aortic arch and carotid atherosclerosis. But no hemodynamically significant Large vessel stenosis. There is a moderate Right PCA P2 stenosis. 3. But positive for general hyper-dynamic appearance of left hemisphere branch enhancement (series 15 image 18), suggesting left hemisphere hyper perfusion (such as can be seen with seizure activity).  MRI Brain (Personally reviewed): 1. Acute left parietal and occipital cortical infarcts. Additional small acute infarcts in the posterior left frontal white matter and left basal ganglia. 2. Diffuse edema in the cerebellum bilaterally and also in the medial thalami, potentially due to posterior reversible encephalopathy syndrome (PRES), cerebellitis/encephalitis, or ADEM. Alcoholic encephalopathy is also a consideration for the thalamic findings.  TTE: 1. Left ventricular ejection fraction, by estimation, is 65 to 70%. The left ventricle has normal function. The left ventricle has no regional  wall motion abnormalities. Left ventricular diastolic parameters are  consistent with Grade I diastolic dysfunction (impaired relaxation).    2. Right ventricular systolic function is normal. The right ventricular size is normal. There is mildly elevated pulmonary artery systolic  pressure. The estimated right ventricular systolic pressure is 40.3 mmHg.   3. Left atrial size was mildly dilated.   4. The mitral valve is normal in structure. Trivial mitral valve regurgitation. No evidence of mitral stenosis.   5. The aortic valve is tricuspid. There is mild calcification of the aortic valve. Aortic valve regurgitation is moderate. No aortic stenosis  is  present. Aortic regurgitation PHT measures 352 msec.   6. The inferior vena cava is normal in size with <50% respiratory variability, suggesting right atrial pressure of 8 mmHg.   Assessment  63 y.o. female with a PMHx of COPD, tobacco use, GERD, hyperlipidemia, chronic pain syndrome, alcohol use disorder, hypothyroid and HTN who presented to the ED in the morning on 12/22 confused and combative. Fiance said that the patient had complained of staggering gait 2 days prior to presenting and that the night before presenting she had become altered and had complained of a headache. When he woke up, he said she was moaning and not responding to his questions so he called EMS. Upon EMS arrival the patient became combative and was given Haldol  5 mg and Versed  5 mg IM. UDS positive for benzodiazepine and amphetamine. LP was performed, revealing CSF with mild pleocytosis in the  context of a bloody tap, with negative CSF cultures and negative meningitis/encephalitis PCR panel. - Exam today reveals an awake, alert and oriented female appearing greater than her stated age. She has no visual field cut, but did have targeting deficit on the right with FNF. No clinical seizure-like activity appreciated.  - LTM EEG report for this morning: Continuous slow, left hemisphere; Background slow. This study is suggestive of cortical dysfunction arising from left hemisphere likely secondary to underlying structural abnormality. Additionally there is generalized cerebral dysfunction (encephalopathy). No seizures or epileptiform discharges were seen throughout the recording. - CTA of head and neck: Expected CT appearance of the brain with scattered hypodensity. No hemorrhagic transformation or intracranial mass effect. CT appearance of the left lateral occipital lobe infarct has progressed over the past 2 days. Negative for large vessel occlusion. Widespread aortic arch and carotid atherosclerosis. But no hemodynamically significant  Large vessel stenosis. There is a moderate Right PCA P2 stenosis. But positive for general hyper-dynamic appearance of left hemisphere branch enhancement (series 15 image 18), suggesting left hemisphere hyper perfusion (such as can be seen with seizure activity). - LP findings are most consistent with a bloody tap. Meningitis/encephalitis PCR panel is negative. No growth in CSF x 3 days and no organisms seen.  - Differential on initial presentation was broad, including PRES, ADEM, paraneoplastic and autommune encephalitis. 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 Her rapid decline would be atypical for an autoimmune/paraneoplastic process. She was also hypertensive on arrival, which would be more consistent with PRES.   Recommendations  For PRES versus autoimmune encephalitis versus severe metabolic syndrome (thiamine  deficiency, severe hypoglycemia): - Thiamine  high dose replacement protocol. - Autoimmnue encephalopathy panel pending - Monitor CBG.  - Recommended that she no longer take pills that are offered to her by associates in the context of being amphetamine positive (states she was given a pill to make her feel better at work, shortly before onset of the symptoms with which she presented). Advised patient that PRES with brain swelling can occur secondary to severe HTN in the setting of amphetamine use.    For noted strokes which we suspect are most likely secondary to PRES: - Frequent Neuro checks per stroke unit protocol - Antithrombotic - aspirin  81mg  daily. - Recommend DVT ppx - Recommend Telemetry monitoring for arrythmia - PT/OT/ST per protocol  - Outpatient Neurology follow up ______________________________________________________________________  Electronically signed: Dr. Manhattan Mccuen       [1]  Current Facility-Administered Medications:    aspirin  chewable tablet 81 mg, 81 mg, Oral, Daily **OR** [DISCONTINUED]  aspirin  suppository 300 mg, 300 mg, Rectal, Daily, Paytes, Austin A, RPH   atorvastatin  (LIPITOR) tablet 40 mg, 40 mg, Oral, Daily, Chen, Lydia D, RPH   Chlorhexidine  Gluconate Cloth 2 % PADS 6 each, 6 each, Topical, Daily, Desai, Rahul P, PA-C, 6 each at 05/31/24 1001   enoxaparin  (LOVENOX ) injection 40 mg, 40 mg, Subcutaneous, Daily, Mannam, Praveen, MD, 40 mg at 05/31/24 1613   feeding supplement (BOOST / RESOURCE BREEZE) liquid 1 Container, 1 Container, Oral, TID BM, Mannam, Praveen, MD, 1 Container at 05/31/24 2102   folic acid  (FOLVITE ) tablet 1 mg, 1 mg, Oral, Daily, Chen, Lydia D, [facility]   hydrALAZINE  (APRESOLINE ) injection 5-10 mg, 5-10 mg, Intravenous, Q4H PRN, Bowser, Grace E, NP, 5 mg at 05/31/24 1008   ipratropium-albuterol  (DUONEB) 0.5-2.5 (3) MG/3ML nebulizer solution 3 mL, 3 mL, Nebulization, Q6H PRN, Bowser, Grace E,  NP   lactated ringers  infusion, , Intravenous, Continuous, Emmette Nidia NOVAK, RPH, Last Rate: 125 mL/hr at 05/31/24 1825, Infusion Verify at 05/31/24 1825   levothyroxine  (SYNTHROID ) tablet 88 mcg, 88 mcg, Oral, Q0600, Mannam, Praveen, MD, 88 mcg at 06/01/24 0539   losartan  (COZAAR ) tablet 50 mg, 50 mg, Oral, Daily, Sigdel, Santosh, MD   nicotine  (NICODERM CQ  - dosed in mg/24 hours) patch 14 mg, 14 mg, Transdermal, Daily, Desai, Rahul P, PA-C, 14 mg at 05/31/24 9096   Oral care mouth rinse, 15 mL, Mouth Rinse, Q2H, Bowser, Grace E, NP, 15 mL at 06/01/24 0600   Oral care mouth rinse, 15 mL, Mouth Rinse, PRN, Bowser, Ronnald BRAVO, NP   piperacillin -tazobactam (ZOSYN ) IVPB 3.375 g, 3.375 g, Intravenous, Q8H, Mannam, Praveen, MD, Last Rate: 12.5 mL/hr at 06/01/24 0110, 3.375 g at 06/01/24 0110   potassium chloride  (KLOR-CON ) packet 40 mEq, 40 mEq, Oral, BID, Sigdel, Santosh, MD   sodium chloride  flush (NS) 0.9 % injection 10-40 mL, 10-40 mL, Intracatheter, Q12H, Mannam, Praveen, MD, 10 mL at 05/31/24 2103   sodium chloride  flush (NS) 0.9 % injection 10-40 mL, 10-40 mL,  Intracatheter, PRN, Mannam, Praveen, MD   thiamine  (VITAMIN B1) tablet 250 mg, 250 mg, Oral, Daily **FOLLOWED BY** [START ON 06/07/2024] thiamine  (VITAMIN B1) tablet 100 mg, 100 mg, Oral, Daily, Chen, Lydia D, [facility]

## 2024-06-01 NOTE — Progress Notes (Signed)
 OT Cancellation Note - OT Screen and Signing Off  Patient Details Name: [name] MRN: [id number] DOB: 25-Nov-1960   Cancelled Treatment:    Reason Eval/Treat Not Completed: OT screened, no needs identified, will sign off (OT screen complete. Pt currrently demonstrating ability to complete ADLs and functional mobility Independently. Also discussed pt with PT. No skilled OT or equipment needs identified at this time. OT is signing off.)  Margarie Rockey HERO., OTR/L, MA Acute Rehab [phone]   Margarie FORBES Horns 06/01/2024, 10:32 AM

## 2024-06-02 DIAGNOSIS — G9341 Metabolic encephalopathy: Secondary | ICD-10-CM | POA: Diagnosis not present

## 2024-06-02 DIAGNOSIS — I6783 Posterior reversible encephalopathy syndrome: Secondary | ICD-10-CM | POA: Diagnosis present

## 2024-06-02 LAB — CULTURE, BLOOD (ROUTINE X 2)
Culture: NO GROWTH
Culture: NO GROWTH

## 2024-06-02 LAB — CSF CULTURE W GRAM STAIN: Culture: NO GROWTH

## 2024-06-02 MED ORDER — LOSARTAN POTASSIUM 50 MG PO TABS
50.0000 mg | ORAL_TABLET | Freq: Once | ORAL | Status: AC
  Administered 2024-06-02: 50 mg via ORAL
  Filled 2024-06-02: qty 1

## 2024-06-02 MED ORDER — LOSARTAN POTASSIUM 100 MG PO TABS: 100.0000 mg | ORAL_TABLET | Freq: Every day | ORAL | 0 refills | Status: AC

## 2024-06-02 MED ORDER — LOSARTAN POTASSIUM 50 MG PO TABS: 100.0000 mg | ORAL_TABLET | Freq: Every day | ORAL | Status: DC

## 2024-06-02 MED ORDER — FOLIC ACID 1 MG PO TABS: 1.0000 mg | ORAL_TABLET | Freq: Every day | ORAL | 2 refills | Status: AC

## 2024-06-02 MED ORDER — THIAMINE HCL 100 MG PO TABS: ORAL_TABLET | ORAL | 0 refills | Status: AC

## 2024-06-02 MED ORDER — AMLODIPINE BESYLATE 5 MG PO TABS: 5.0000 mg | ORAL_TABLET | Freq: Every day | ORAL | 0 refills | Status: AC

## 2024-06-02 MED ORDER — ASPIRIN 81 MG PO CHEW: 81.0000 mg | CHEWABLE_TABLET | Freq: Every day | ORAL | 3 refills | Status: AC

## 2024-06-02 NOTE — Evaluation (Signed)
 Speech Language Pathology Evaluation Patient Details Name: [name] MRN: [id number] DOB: 1961/02/08 Today's Date: 06/02/2024 Time: 9149-9086 SLP Time Calculation (min) (ACUTE ONLY): 23 min  Problem List:  Patient Active Problem List   Diagnosis Date Noted   Acute metabolic encephalopathy 05/28/2024   COPD exacerbation (HCC) 03/16/2024   Acute gastric ulcer without hemorrhage or perforation 12/04/2022   Duodenal ulcer 12/04/2022   Duodenal stenosis 12/04/2022   GI bleeding 12/03/2022   Heme positive stool 12/03/2022   Leg ulcer, left (HCC) 11/14/2022   Pneumococcal pneumonia 11/10/2022   Severe sepsis (HCC) 11/09/2022   Intractable nausea and vomiting 05/30/2022   Abdominal pain 05/30/2022   UTI (urinary tract infection) 05/30/2022   Diarrhea 05/30/2022   Gastroenteritis due to COVID-19 virus 05/30/2022   Dehydration 05/30/2022   Iron  deficiency anemia 05/30/2022   Elevated lipase 05/30/2022   Mixed hyperlipidemia 05/30/2022   Abnormal drug screen (12/12/2019) 01/13/2020   Substance use disorder 01/13/2020   Long term prescription benzodiazepine use 01/13/2020   Chronic, continuous use of opioids 01/13/2020   Amphetamine use 01/13/2020   Lumbar facet syndrome (Bilateral) (R>L) 12/12/2019   Chronic sacroiliac joint pain (Left) 12/12/2019   Chronic pain syndrome 12/11/2019   Pharmacologic therapy 12/11/2019   Disorder of skeletal system 12/11/2019   Problems influencing health status 12/11/2019   Benign hypertensive kidney disease with chronic kidney disease 11/07/2019   Insomnia 09/12/2019   Cramps of lower extremity 09/12/2019   Aortic atherosclerosis 09/12/2019   GERD (gastroesophageal reflux disease) 09/12/2019   Acute respiratory failure with hypoxia (HCC) 06/05/2019   COPD with acute exacerbation (HCC) 06/05/2019   Acquired hypothyroidism 06/05/2019   Chronic low back pain (1ry area of Pain) (Bilateral) w/o sciatica 08/16/2018   Stage 3 chronic kidney disease  (HCC) 08/16/2018   Insomnia due to other mental disorder 08/16/2018   Lesion of epiglottis 08/10/2018   Chronic abdominal pain 01/03/2018   Orthostasis 08/22/2017   Hypercalcemia 08/15/2017   Macrocytosis 08/15/2017   Lung nodules 05/07/2016   COPD (chronic obstructive pulmonary disease) (HCC) 05/07/2016   Chronic obstructive pulmonary disease (HCC) 05/07/2016   Essential hypertension 07/16/2013   Protein-calorie malnutrition, severe 01/12/2013   AKI (acute kidney injury) 01/11/2013   GI bleed 01/07/2012   Alcohol abuse 01/07/2012   Hypothyroidism 03/03/2010   Past Medical History:  Past Medical History:  Diagnosis Date   Acute diverticulitis 07/16/2013   Acute renal failure 07/16/2013   Arthritis    hands, back, knees   Colitis 07/16/2013   Diverticulitis    Elevated CEA 08/15/2017   GERD (gastroesophageal reflux disease)    GIB (gastrointestinal bleeding) 01/07/2012   Hypertension    somewhat controlled, taking medication   Hypothyroidism    Intractable nausea and vomiting 05/30/2022   Lesion of epiglottis    Macrocytosis without anemia 08/15/2017   Orthostasis 08/22/2017   Past Surgical History:  Past Surgical History:  Procedure Laterality Date   ABDOMINAL HYSTERECTOMY     APPENDECTOMY     BIOPSY  12/04/2022   Procedure: BIOPSY;  Surgeon: Albertus Gordy HERO, MD;  Location: [facility] ENDOSCOPY;  Service: Gastroenterology;;   CHOLECYSTECTOMY     COLONOSCOPY  01/09/2012   Procedure: COLONOSCOPY;  Surgeon: Jerrell KYM Sol, MD;  Location: [facility] ENDOSCOPY;  Service: Endoscopy;  Laterality: N/A;   COLONOSCOPY WITH PROPOFOL  N/A 08/25/2017   Procedure: COLONOSCOPY WITH PROPOFOL ;  Surgeon: Therisa Bi, MD;  Location: [facility] ENDOSCOPY;  Service: Gastroenterology;  Laterality: N/A;   ESOPHAGOGASTRODUODENOSCOPY  01/07/2012  Procedure: ESOPHAGOGASTRODUODENOSCOPY (EGD);  Surgeon: Lynwood LITTIE Celestia Mickey., MD;  Location[facility] ENDOSCOPY;  Service: Endoscopy;  Laterality: N/A;   ESOPHAGOGASTRODUODENOSCOPY (EGD)  WITH PROPOFOL  N/A 08/25/2017   Procedure: ESOPHAGOGASTRODUODENOSCOPY (EGD) WITH PROPOFOL ;  Surgeon: Therisa Bi, MD;  Location: [facility] ENDOSCOPY;  Service: Gastroenterology;  Laterality: N/A;   ESOPHAGOGASTRODUODENOSCOPY (EGD) WITH PROPOFOL  N/A 12/04/2022   Procedure: ESOPHAGOGASTRODUODENOSCOPY (EGD) WITH PROPOFOL ;  Surgeon: Albertus Gordy HERO, MD;  Location: [facility] ENDOSCOPY;  Service: Gastroenterology;  Laterality: N/A;   VIDEO BRONCHOSCOPY Bilateral 05/14/2016   Procedure: VIDEO BRONCHOSCOPY WITHOUT FLUORO;  Surgeon: Lamar GORMAN Chris, MD;  Location: [facility] ENDOSCOPY;  Service: Cardiopulmonary;  Laterality: Bilateral;   HPI:  63 year old with history of hypothyroidism, AKI, protein calorie malnutrition, COPD with altered mental status, fevers and concern for sepsis on 05/28/24.  Noted to be agitated, confused in the ED.  CT head negative.  UDS shows benzos and amphetamines.  IV fluids for elevated lactic acid.  PCCM consulted for help with management.  MRI head revealed Acute left parietal and occipital cortical infarcts. Additional small acute  infarcts in the posterior left frontal white matter and left basal ganglia.  ST consulted for speech/language cognitive assessment.   Assessment / Plan / Recommendation Clinical Impression  Recommend ST f/u at next venue of care if pt does not return to baseline functional level per family/pt report and OT/PT recommendations.  Speech/language cognitive assessment grossly within normal limits.  Pt administered portions of the [employer] Mental Status Examination (SLUMS) and Cognistat with pt Ox4, able to follow sustained attention tasks without difficulty and speech intelligible within simple conversation.  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  Memory recall for a paragraph elicited 100% accuracy and pt awareness for deficits/anticipatory  awareness accurate for functional tasks.  Pt able to solve simple problem solving tasks without verbal cueing provided.  Suspect pt is at baseline level of functioning, but no family available to confirm or deny.  ST will s/o in acute setting as no needs identified within verbal expression, auditory comprehension and/or cognition.  Thank you for this consult.    SLP Assessment  SLP Recommendation/Assessment: All further Speech Language Pathology needs can be addressed in the next venue of care (prn if symptoms persist) SLP Visit Diagnosis: Cognitive communication deficit (R41.841)     Assistance Recommended at Discharge  Other (comment) (TBD)  Functional Status Assessment Patient has had a recent decline in their functional status and demonstrates the ability to make significant improvements in function in a reasonable and predictable amount of time.  Frequency and Duration Other (Comment) (evaluation only)         SLP Evaluation Cognition  Overall Cognitive Status: No family/caregiver present to determine baseline cognitive functioning Arousal/Alertness: Awake/alert Orientation Level: Oriented X4 Attention: Sustained Sustained Attention: Appears intact Memory: Appears intact Awareness: Appears intact Problem Solving: Appears intact (for simple situations) Safety/Judgment: Appears intact       Comprehension  Auditory Comprehension Overall Auditory Comprehension: Appears within functional limits for tasks assessed Commands: Not tested Conversation: Simple Visual Recognition/Discrimination Discrimination: Within Function Limits Reading Comprehension Reading Status: Within funtional limits (for simple information/environmental signs)    Expression Expression Primary Mode of Expression: Verbal Verbal Expression Overall Verbal Expression: Appears within functional limits for tasks assessed Level of Generative/Spontaneous Verbalization: Conversation Naming: No  impairment Pragmatics: No impairment Non-Verbal Means of Communication: Not applicable Written Expression  Dominant Hand: Right Written Expression: Not tested   Oral / Motor  Oral Motor/Sensory Function Overall Oral Motor/Sensory Function: Within functional limits Motor Speech Overall Motor Speech: Appears within functional limits for tasks assessed Respiration: Within functional limits Phonation: Normal Resonance: Within functional limits Articulation: Within functional limitis Intelligibility: Intelligible Motor Planning: Within functional limits Motor Speech Errors: Not applicable            Pat Aleathea Pugmire,M.S.,CCC-SLP 06/02/2024, 9:45 AM

## 2024-06-02 NOTE — Discharge Summary (Signed)
 Physician Discharge Summary  [name] FMW:[id number] DOB: 1960/10/12 DOA: 05/28/2024  PCP: [facility]  Admit date: 05/28/2024 Discharge date: 06/02/2024  Admitted From: Home Disposition: Home  Recommendations for Outpatient Follow-up:  Follow up with PCP in 1 week with repeat CBC/BMP.  Review antihypertensives/strict blood pressure management Follow-up with neurology in 3 to 4 weeks for abnormal MRI/pres.  Follow-up on autoimmune panel Follow up in ED if recurrence of symptoms Counseled to avoid alcohol, illicit drugs  Home Health: No Equipment/Devices: None  Discharge Condition: Stable CODE STATUS: Full Diet recommendation: Heart healthy  Brief/Interim Summary:  [name] is a 63 y.o. female who has a PMH as below. She presented to [facility] ED [facility]/28/24 via EMS for AMS, headaches, gait disturbance 2 days prior. Fiance noted AMS the night prior which did not resolve by day of presentation.   She came to ED where she was agitated, somewhat combative, confused, moaning non sensible words. CT head was negative. UDS positive for benzos and amphetamines. Patient later admitted taking ecstasy pill from her coworker.   She was hypertensive to 190s systolic and tachycardic to 120s. She had fever to 101.2 rectally.  On exam she was profoundly hypovolemic and fluid resuscitated. 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  Patient was severely agitated.  She was moved to ICU, intubated for LP on 12/23, extubated 12/24.  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    Symptoms gradually improved.  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  Additionally small acute infarct in the posterior left frontal white matter and left basal ganglia.  Diffuse edema in the cerebellum bilaterally and also in the medial thalami potentially due to PR ES, cerebritis/encephalitis or ADEM.   Alcoholic encephalopathy is also a consideration however, no changes.  EEG was negative for epileptic activity  Evaluated by neurology.  Her MRI changes were overall thought to be secondary to uncontrolled blood pressure/PR ES  She was initiated on high-dose thiamine , also started on aspirin .  Blood pressure management.  Transferred to hospitalist service on 12/26.  Discharged on 12/27.  Hypertension: Was on losartan  25 mg daily at home.  Increase to 100 mg daily with additional amlodipine  5 mg daily .  Need to follow-up with PCP for further adjustment.  Possible colitis on CT, received Zosyn  in hospital however no antibiotics on discharge.   Discharge Diagnoses:  Principal Problem:   Acute metabolic encephalopathy Active Problems:   Chronic pain syndrome   Substance use disorder   AKI (acute kidney injury)   Alcohol abuse   COPD (chronic obstructive pulmonary disease) (HCC)   PRES (posterior reversible encephalopathy syndrome)    Discharge Instructions  Discharge Instructions     Ambulatory referral to Neurology   Complete by: As directed    An appointment is requested in approximately: 4 weeks   Call MD for:  difficulty breathing, headache or visual disturbances   Complete by: As directed    Call MD for:  extreme fatigue   Complete by: As directed    Call MD for:  persistant dizziness or light-headedness   Complete by: As directed    Call MD for:  persistant nausea and vomiting   Complete by: As directed    Diet - low sodium heart healthy   Complete by: As directed    Discharge instructions   Complete by: As directed    1. Avoid alcohol and illicit drugs 2. Follow up  with PCP for BP management.  3. Follow up with neurology   Increase activity slowly   Complete by: As directed    No wound care   Complete by: As directed       Allergies as of 06/02/2024   No Known Allergies      Medication List     STOP taking these medications    predniSONE  20 MG  tablet Commonly known as: DELTASONE        TAKE these medications    albuterol  108 (90 Base) MCG/ACT inhaler Commonly known as: VENTOLIN  HFA Inhale 2 puffs into the lungs every 6 (six) hours as needed for wheezing or shortness of breath.   albuterol  (2.5 MG/3ML) 0.083% nebulizer solution Commonly known as: PROVENTIL  Take 3 mLs (2.5 mg total) by nebulization every 4 (four) hours as needed for shortness of breath.   amLODipine  5 MG tablet Commonly known as: NORVASC  Take 1 tablet (5 mg total) by mouth daily. Start taking on: June 03, 2024   amphetamine-dextroamphetamine 5 MG tablet Commonly known as: ADDERALL Take 5 mg by mouth daily.   aspirin  81 MG chewable tablet Chew 1 tablet (81 mg total) by mouth daily. Start taking on: June 03, 2024   Biotin 5 MG Tabs Take 1 tablet by mouth daily.   Cholecalciferol  125 MCG (5000 UT) Tabs Take 10,000 Units by mouth daily.   cyclobenzaprine  10 MG tablet Commonly known as: FLEXERIL  Take 10 mg by mouth 2 (two) times daily as needed.   ferrous sulfate  325 (65 FE) MG tablet Take 1 tablet (325 mg total) by mouth 2 (two) times daily with a meal.   folic acid  1 MG tablet Commonly known as: FOLVITE  Take 1 tablet (1 mg total) by mouth daily. Start taking on: June 03, 2024   HYDROcodone -acetaminophen  10-325 MG tablet Commonly known as: NORCO Take 1 tablet by mouth 3 (three) times daily as needed.   levothyroxine  88 MCG tablet Commonly known as: SYNTHROID  Take 88 mcg by mouth daily.   losartan  100 MG tablet Commonly known as: COZAAR  Take 1 tablet (100 mg total) by mouth daily. Start taking on: June 03, 2024 What changed:  medication strength how much to take   naloxone 4 MG/0.1ML Liqd nasal spray kit Commonly known as: NARCAN Place 1 spray into the nose once.   pregabalin 50 MG capsule Commonly known as: LYRICA Take 50 mg by mouth 2 (two) times daily.   thiamine  100 MG tablet Commonly known as: VITAMIN  B1 Take 2 tablets daily for 5 days followed by 1 tablet daily   traZODone  50 MG tablet Commonly known as: DESYREL  Take 50-100 mg by mouth at bedtime.        Follow-up Information     [facility]. Schedule an appointment as soon as possible for a visit in 1 week(s).   Contact information: [address] [place] [address] [phone]         [facility]. Schedule an appointment as soon as possible for a visit in 4 week(s).   Specialty: Neurology Contact information: [address] [place]  [address] [phone]               Allergies[1]  Consultations:    Procedures/Studies:     Subjective:   Discharge Exam: Vitals:   06/02/24 0851 06/02/24 0937  BP: (!) 189/77 (!) 165/82  Pulse:    Resp:    Temp:    SpO2:  General: Pt is alert, awake, not in acute distress Cardiovascular: rate controlled, S1/S2 + Respiratory: bilateral decreased breath sounds at bases Abdominal: Soft, NT, ND, bowel sounds + Extremities: no edema, no cyanosis    The results of significant diagnostics from this hospitalization (including imaging, microbiology, ancillary and laboratory) are listed below for reference.     Microbiology: Recent Results (from the past 240 hours)  Blood Culture (Routine X 2)     Status: None   Collection Time: 05/28/24  9:43 AM   Specimen: BLOOD  Result Value Ref Range Status   Specimen Description BLOOD SITE NOT SPECIFIED  Final   Special Requests   Final    BOTTLES DRAWN AEROBIC ONLY Blood Culture results may not be optimal due to an inadequate volume of blood received in culture bottles   Culture   Final    NO GROWTH 5 DAYS Performed at [facility], [address], [place], [place] [address]    Report Status 06/02/2024 FINAL  Final  Resp panel by RT-PCR (RSV, Flu A&B, Covid) Anterior Nasal Swab     Status: None   Collection Time: 05/28/24 10:07 AM    Specimen: Anterior Nasal Swab  Result Value Ref Range Status   SARS Coronavirus 2 by RT PCR NEGATIVE NEGATIVE Final   Influenza A by PCR NEGATIVE NEGATIVE Final   Influenza B by PCR NEGATIVE NEGATIVE Final    Comment: (NOTE) The Xpert Xpress SARS-CoV-2/FLU/RSV plus assay is intended as an aid in the diagnosis of influenza from Nasopharyngeal swab specimens and should not be used as a sole basis for treatment. Nasal washings and aspirates are unacceptable for Xpert Xpress SARS-CoV-2/FLU/RSV testing.  Fact Sheet for Patients: bloggercourse.com  Fact Sheet for Healthcare Providers: seriousbroker.it  This test is not yet approved or cleared by the [place]  FDA and has been authorized for detection and/or diagnosis of SARS-CoV-2 by FDA under an Emergency Use Authorization (EUA). This EUA will remain in effect (meaning this test can be used) for the duration of the COVID-19 declaration under Section 564(b)(1) of the Act, 21 U.S.C. section 360bbb-3(b)(1), unless the authorization is terminated or revoked.     Resp Syncytial Virus by PCR NEGATIVE NEGATIVE Final    Comment: (NOTE) Fact Sheet for Patients: bloggercourse.com  Fact Sheet for Healthcare Providers: seriousbroker.it  This test is not yet approved or cleared by the [place]  FDA and has been authorized for detection and/or diagnosis of SARS-CoV-2 by FDA under an Emergency Use Authorization (EUA). This EUA will remain in effect (meaning this test can be used) for the duration of the COVID-19 declaration under Section 564(b)(1) of the Act, 21 U.S.C. section 360bbb-3(b)(1), unless the authorization is terminated or revoked.  Performed at [facility], [address], [place], [place] [address]   MRSA Next Gen by PCR, Nasal     Status: None   Collection Time: 05/28/24  5:00 PM   Specimen: Nasal Mucosa; Nasal  Swab  Result Value Ref Range Status   MRSA by PCR Next Gen NOT DETECTED NOT DETECTED Final    Comment: (NOTE) The GeneXpert MRSA Assay (FDA approved for NASAL specimens only), is one component of a comprehensive MRSA colonization surveillance program. It is not intended to diagnose MRSA infection nor to guide or monitor treatment for MRSA infections. Test performance is not FDA approved in patients less than 38 years old. Performed at [facility], [address], [place], [place] [address]   Blood Culture (  Routine X 2)     Status: None   Collection Time: 05/28/24  9:08 PM   Specimen: BLOOD  Result Value Ref Range Status   Specimen Description BLOOD SITE NOT SPECIFIED  Final   Special Requests   Final    BOTTLES DRAWN AEROBIC AND ANAEROBIC Blood Culture results may not be optimal due to an inadequate volume of blood received in culture bottles   Culture   Final    NO GROWTH 5 DAYS Performed at [facility], [address] [place], [place] [address]    Report Status 06/02/2024 FINAL  Final  CSF culture w Gram Stain     Status: None   Collection Time: 05/29/24 10:54 AM   Specimen: CSF; Cerebrospinal Fluid  Result Value Ref Range Status   Specimen Description CSF  Final   Special Requests LUMBAR PUNCTURE  Final   Gram Stain   Final    WBC PRESENT, PREDOMINANTLY PMN NO ORGANISMS SEEN CYTOSPIN SMEAR    Culture   Final    NO GROWTH 3 DAYS Performed at [facility], [address], [place], [place] [address]    Report Status 06/02/2024 FINAL  Final  VZV PCR, CSF     Status: None   Collection Time: 05/29/24 10:54 AM   Specimen: Cerebrospinal Fluid  Result Value Ref Range Status   VZV PCR, CSF Negative Negative Final    Comment: (NOTE) No Varicella Zoster Virus DNA detected. Performed At: [facility] [address] [place], [place] [id number] Jennette Shorter MD Ey:[id number]   Culture, fungus without smear     Status: None (Preliminary result)    Collection Time: 05/29/24 10:54 AM   Specimen: CSF; Cerebrospinal Fluid  Result Value Ref Range Status   Specimen Description CSF  Final   Special Requests Normal  Final   Culture   Final    NO FUNGUS ISOLATED AFTER 3 DAYS Performed at [facility], [address] [place], [place] [address]    Report Status PENDING  Incomplete     Labs: BNP (last 3 results) No results for input(s): BNP in the last 8760 hours. Basic Metabolic Panel: Recent Labs  Lab 05/29/24 0453 05/29/24 1407 05/29/24 1820 05/30/24 0336 05/31/24 1009 06/01/24 0555  NA 132* 133* 129* 134* 140 141  K 3.4* 2.7* 4.0 3.1* 3.8 3.3*  CL 98  --  100 102 101 103  CO2 15*  --  18* 22 17* 23  GLUCOSE 107*  --  104* 101* 54* 81  BUN 15  --  17 17 16 10   CREATININE 0.88  --  0.90 0.84 0.85 0.77  CALCIUM  9.2  --  7.4* 8.2* 8.9 9.4  MG 1.3*  --   --  2.6*  --   --   PHOS 2.3*  --   --  2.3*  --   --    Liver Function Tests: Recent Labs  Lab 05/28/24 0943 05/30/24 0336  AST 33 23  ALT 7 9  ALKPHOS 136* 68  BILITOT 0.5 0.3  PROT 7.9 4.8*  ALBUMIN 4.6 2.9*   No results for input(s): LIPASE, AMYLASE in the last 168 hours. Recent Labs  Lab 05/30/24 0336  AMMONIA 32   CBC: Recent Labs  Lab 05/28/24 0943 05/29/24 0453 05/29/24 1407 05/30/24 0504 05/31/24 1009 06/01/24 0555  WBC 15.9* 18.0*  --  10.7* 12.2* 10.3  NEUTROABS 14.2*  --   --   --   --   --  HGB 18.0* 17.2* 13.9 12.8 14.2 13.2  HCT 52.1* 49.0* 41.0 37.5 41.5 38.9  MCV 93.0 91.1  --  94.2 94.1 93.7  PLT 327 284  --  172 226 271   Cardiac Enzymes: Recent Labs  Lab 05/28/24 0943  CKTOTAL 141   BNP: Invalid input(s): POCBNP CBG: Recent Labs  Lab 05/28/24 0910  GLUCAP 159*   D-Dimer No results for input(s): DDIMER in the last 72 hours. Hgb A1c No results for input(s): HGBA1C in the last 72 hours. Lipid Profile No results for input(s): CHOL, HDL, LDLCALC, TRIG, CHOLHDL, LDLDIRECT in the last 72  hours. Thyroid  function studies No results for input(s): TSH, T4TOTAL, T3FREE, THYROIDAB in the last 72 hours.  Invalid input(s): FREET3 Anemia work up No results for input(s): VITAMINB12, FOLATE, FERRITIN, TIBC, IRON , RETICCTPCT in the last 72 hours. Urinalysis    Component Value Date/Time   COLORURINE YELLOW 05/28/2024 1203   APPEARANCEUR CLEAR 05/28/2024 1203   APPEARANCEUR Cloudy 03/28/2013 1640   LABSPEC 1.020 05/28/2024 1203   LABSPEC 1.021 03/28/2013 1640   PHURINE 6.5 05/28/2024 1203   GLUCOSEU NEGATIVE 05/28/2024 1203   GLUCOSEU Negative 03/28/2013 1640   HGBUR MODERATE (A) 05/28/2024 1203   BILIRUBINUR NEGATIVE 05/28/2024 1203   BILIRUBINUR Negative 03/28/2013 1640   KETONESUR NEGATIVE 05/28/2024 1203   PROTEINUR >300 (A) 05/28/2024 1203   UROBILINOGEN 0.2 12/18/2014 1704   NITRITE NEGATIVE 05/28/2024 1203   LEUKOCYTESUR NEGATIVE 05/28/2024 1203   LEUKOCYTESUR Trace 03/28/2013 1640   Sepsis Labs Recent Labs  Lab 05/29/24 0453 05/30/24 0504 05/31/24 1009 06/01/24 0555  WBC 18.0* 10.7* 12.2* 10.3   Microbiology Recent Results (from the past 240 hours)  Blood Culture (Routine X 2)     Status: None   Collection Time: 05/28/24  9:43 AM   Specimen: BLOOD  Result Value Ref Range Status   Specimen Description BLOOD SITE NOT SPECIFIED  Final   Special Requests   Final    BOTTLES DRAWN AEROBIC ONLY Blood Culture results may not be optimal due to an inadequate volume of blood received in culture bottles   Culture   Final    NO GROWTH 5 DAYS Performed at [facility], [address] [place], [place] [address]    Report Status 06/02/2024 FINAL  Final  Resp panel by RT-PCR (RSV, Flu A&B, Covid) Anterior Nasal Swab     Status: None   Collection Time: 05/28/24 10:07 AM   Specimen: Anterior Nasal Swab  Result Value Ref Range Status   SARS Coronavirus 2 by RT PCR NEGATIVE NEGATIVE Final   Influenza A by PCR NEGATIVE NEGATIVE Final    Influenza B by PCR NEGATIVE NEGATIVE Final    Comment: (NOTE) The Xpert Xpress SARS-CoV-2/FLU/RSV plus assay is intended as an aid in the diagnosis of influenza from Nasopharyngeal swab specimens and should not be used as a sole basis for treatment. Nasal washings and aspirates are unacceptable for Xpert Xpress SARS-CoV-2/FLU/RSV testing.  Fact Sheet for Patients: bloggercourse.com  Fact Sheet for Healthcare Providers: seriousbroker.it  This test is not yet approved or cleared by the [place]  FDA and has been authorized for detection and/or diagnosis of SARS-CoV-2 by FDA under an Emergency Use Authorization (EUA). This EUA will remain in effect (meaning this test can be used) for the duration of the COVID-19 declaration under Section 564(b)(1) of the Act, 21 U.S.C. section 360bbb-3(b)(1), unless the authorization is terminated or revoked.     Resp Syncytial Virus by PCR NEGATIVE  NEGATIVE Final    Comment: (NOTE) Fact Sheet for Patients: bloggercourse.com  Fact Sheet for Healthcare Providers: seriousbroker.it  This test is not yet approved or cleared by the [place]  FDA and has been authorized for detection and/or diagnosis of SARS-CoV-2 by FDA under an Emergency Use Authorization (EUA). This EUA will remain in effect (meaning this test can be used) for the duration of the COVID-19 declaration under Section 564(b)(1) of the Act, 21 U.S.C. section 360bbb-3(b)(1), unless the authorization is terminated or revoked.  Performed at [facility], [address] [place], [place] [address]   MRSA Next Gen by PCR, Nasal     Status: None   Collection Time: 05/28/24  5:00 PM   Specimen: Nasal Mucosa; Nasal Swab  Result Value Ref Range Status   MRSA by PCR Next Gen NOT DETECTED NOT DETECTED Final    Comment: (NOTE) The GeneXpert MRSA Assay (FDA approved for NASAL  specimens only), is one component of a comprehensive MRSA colonization surveillance program. It is not intended to diagnose MRSA infection nor to guide or monitor treatment for MRSA infections. Test performance is not FDA approved in patients less than 32 years old. Performed at [facility], [address], [place], [place] [address]   Blood Culture (Routine X 2)     Status: None   Collection Time: 05/28/24  9:08 PM   Specimen: BLOOD  Result Value Ref Range Status   Specimen Description BLOOD SITE NOT SPECIFIED  Final   Special Requests   Final    BOTTLES DRAWN AEROBIC AND ANAEROBIC Blood Culture results may not be optimal due to an inadequate volume of blood received in culture bottles   Culture   Final    NO GROWTH 5 DAYS Performed at [facility], [address], [place], [place] [address]    Report Status 06/02/2024 FINAL  Final  CSF culture w Gram Stain     Status: None   Collection Time: 05/29/24 10:54 AM   Specimen: CSF; Cerebrospinal Fluid  Result Value Ref Range Status   Specimen Description CSF  Final   Special Requests LUMBAR PUNCTURE  Final   Gram Stain   Final    WBC PRESENT, PREDOMINANTLY PMN NO ORGANISMS SEEN CYTOSPIN SMEAR    Culture   Final    NO GROWTH 3 DAYS Performed at [facility], [address] [place], [place] [address]    Report Status 06/02/2024 FINAL  Final  VZV PCR, CSF     Status: None   Collection Time: 05/29/24 10:54 AM   Specimen: Cerebrospinal Fluid  Result Value Ref Range Status   VZV PCR, CSF Negative Negative Final    Comment: (NOTE) No Varicella Zoster Virus DNA detected. Performed At: [facility] [address] [place], [place] [id number] Jennette Shorter MD Ey:[id number]   Culture, fungus without smear     Status: None (Preliminary result)   Collection Time: 05/29/24 10:54 AM   Specimen: CSF; Cerebrospinal Fluid  Result Value Ref Range Status   Specimen Description CSF  Final   Special Requests  Normal  Final   Culture   Final    NO FUNGUS ISOLATED AFTER 3 DAYS Performed at [facility], [address], [place], [place] [address]    Report Status PENDING  Incomplete     Time coordinating discharge: 35 minutes  SIGNED:   Derryl Duval, MD  [facility] 06/02/2024, 4:46 PM      [  1] No Known Allergies

## 2024-06-02 NOTE — Plan of Care (Signed)
" °  Problem: Education: Goal: Knowledge of General Education information will improve Description: Including pain rating scale, medication(s)/side effects and non-pharmacologic comfort measures Outcome: Progressing   Problem: Health Behavior/Discharge Planning: Goal: Ability to manage health-related needs will improve Outcome: Progressing   Problem: Clinical Measurements: Goal: Ability to maintain clinical measurements within normal limits will improve Outcome: Progressing Goal: Will remain free from infection Outcome: Progressing Goal: Diagnostic test results will improve Outcome: Progressing Goal: Respiratory complications will improve Outcome: Progressing Goal: Cardiovascular complication will be avoided Outcome: Progressing   Problem: Activity: Goal: Risk for activity intolerance will decrease Outcome: Progressing   Problem: Nutrition: Goal: Adequate nutrition will be maintained Outcome: Progressing   Problem: Coping: Goal: Level of anxiety will decrease Outcome: Progressing   Problem: Elimination: Goal: Will not experience complications related to bowel motility Outcome: Progressing Goal: Will not experience complications related to urinary retention Outcome: Progressing   Problem: Pain Managment: Goal: General experience of comfort will improve and/or be controlled Outcome: Progressing   Problem: Safety: Goal: Ability to remain free from injury will improve Outcome: Progressing   Problem: Skin Integrity: Goal: Risk for impaired skin integrity will decrease Outcome: Progressing   Problem: Activity: Goal: Ability to tolerate increased activity will improve Outcome: Progressing   Problem: Respiratory: Goal: Ability to maintain a clear airway and adequate ventilation will improve Outcome: Progressing   Problem: Role Relationship: Goal: Method of communication will improve Outcome: Progressing   Problem: Safety: Goal: Non-violent Restraint(s) Outcome:  Progressing   Problem: Education: Goal: Knowledge of disease or condition will improve Outcome: Progressing Goal: Knowledge of secondary prevention will improve (MUST DOCUMENT ALL) Outcome: Progressing Goal: Knowledge of patient specific risk factors will improve (DELETE if not current risk factor) Outcome: Progressing   Problem: Ischemic Stroke/TIA Tissue Perfusion: Goal: Complications of ischemic stroke/TIA will be minimized Outcome: Progressing   Problem: Coping: Goal: Will verbalize positive feelings about self Outcome: Progressing Goal: Will identify appropriate support needs Outcome: Progressing   Problem: Health Behavior/Discharge Planning: Goal: Ability to manage health-related needs will improve Outcome: Progressing Goal: Goals will be collaboratively established with patient/family Outcome: Progressing   Problem: Self-Care: Goal: Ability to participate in self-care as condition permits will improve Outcome: Progressing Goal: Verbalization of feelings and concerns over difficulty with self-care will improve Outcome: Progressing Goal: Ability to communicate needs accurately will improve Outcome: Progressing   Problem: Nutrition: Goal: Risk of aspiration will decrease Outcome: Progressing Goal: Dietary intake will improve Outcome: Progressing   "

## 2024-06-04 LAB — MISC LABCORP TEST (SEND OUT): Labcorp test code: 505625

## 2024-06-12 ENCOUNTER — Encounter: Payer: Self-pay | Admitting: Urgent Care

## 2024-06-18 NOTE — Progress Notes (Unsigned)
 " Cardiology Office Note   Date:  06/19/2024  ID:  [name], DOB 10/04/1960, MRN [id number] PCP: [facility]  [facility] Providers Cardiologist:  Caron Poser, MD     History of Present Illness [name] is a 64 y.o. female PMH HTN, hypothyroid, GERD, tobacco use who presents for DOE.  Patient had a recent hospitalization 05/28/2024 - 06/02/2024 for altered mental status.  UDS was positive for benzodiazepines and amphetamines.  Patient eventually admitted to taking MDMA.  Hypertensive and febrile upon presentation.  Based on MRI findings and neurology evaluation, presentation felt to be most consistent with PRES. last LDL 92 05/2024.  Patient reports dyspnea since her hospitalization.  She has a long history of tobacco use.  CT scan showed emphysema.  Denies any orthopnea or significant LE edema.  Relevant CVD History -TTE 05/2024 LVEF 65 to 70%, grade 1 diastolic dysfunction, normal RV size and function, estimated PASP 40 mmHg, trivial MR, moderate AI - CTA chest 05/2024 aortic atherosclerosis and three-vessel CAC. Emphysema.   ROS: Pt denies any chest discomfort, jaw pain, arm pain, palpitations, syncope, presyncope, orthopnea, PND, or LE edema.  Studies Reviewed I have independently reviewed the patient's ECG, previous cardiac testing, previous CT scan, previous medical records, previous blood work.  Physical Exam VS:  BP 100/72 (BP Location: Left Arm, Patient Position: Sitting, Cuff Size: Normal)   Pulse 90   Ht 4' 10 (1.473 m)   Wt 107 lb (48.5 kg)   SpO2 97%   BMI 22.36 kg/m        Wt Readings from Last 3 Encounters:  06/19/24 107 lb (48.5 kg)  06/01/24 104 lb 11.5 oz (47.5 kg)  03/15/24 111 lb (50.3 kg)    GEN: No acute distress. NECK: No JVD; No carotid bruits. CARDIAC: RRR, no murmurs, rubs, gallops. RESPIRATORY: Diminished breath sounds. EXTREMITIES:  Warm and well-perfused. No edema.  ASSESSMENT AND PLAN DOE Pulmonary  hypertension seen on echocardiogram Patient presents with dyspnea on exertion which is relatively undifferentiated.  CT scan from recent hospitalization demonstrates emphysema; she also has a very long smoking history.  Her scan also shows three-vessel coronary artery calcifications and aortic atherosclerosis.  Echocardiogram showed preserved ejection fraction with grade 1 diastolic dysfunction.  She is euvolemic on exam and not really endorsing any obvious signs of clinical heart failure such as edema or orthopnea.  Echo does show mild pulmonary hypertension, which could just be from emphysema.  Plan: - Stress PET to evaluate for evidence of obstructive CAD; I think a coronary CTA would be difficult to interpret given severe three-vessel coronary artery calcifications - Can consider RHC down the road to further characterize pulmonary hypertension/symptoms if no alternative causes of dyspnea are identified or if she is persistently dyspneic  COPD Recent CT scan shows emphysema.  She was a longtime smoker.  This seems like a likely cause of her current symptoms.  Will plan to refer her to pulmonology for further care.  CAC Aortic atherosclerosis HLD As above, severe three-vessel CAC and aortic atherosclerosis seen on recent CT scan.  LDL 92 05/2024.  Plan: - Continue ASA 81 mg daily - Start Crestor  10 mg daily; goal LDL less than 70.  Will plan to recheck at next visit.  Moderate AI Seen on TTE 05/2024. Will plan annual surveillance for now.     Informed Consent   The risks [chest pain, shortness of breath, cardiac arrhythmias, dizziness, blood pressure fluctuations, myocardial infarction, stroke/transient ischemic attack,  nausea, vomiting, allergic reaction, radiation exposure, metallic taste sensation and life-threatening complications (estimated to be 1 in 10,000)], benefits (risk stratification, diagnosing coronary artery disease, treatment guidance) and alternatives of a cardiac PET  stress test were discussed in detail with [name] and she agrees to proceed.     Dispo: RTC 3 months or sooner as needed  Signed, Caron Poser, MD  "

## 2024-06-19 ENCOUNTER — Ambulatory Visit

## 2024-06-19 VITALS — BP 100/72 | HR 90 | Ht <= 58 in | Wt 107.0 lb

## 2024-06-19 DIAGNOSIS — I2510 Atherosclerotic heart disease of native coronary artery without angina pectoris: Secondary | ICD-10-CM | POA: Insufficient documentation

## 2024-06-19 DIAGNOSIS — I351 Nonrheumatic aortic (valve) insufficiency: Secondary | ICD-10-CM | POA: Insufficient documentation

## 2024-06-19 DIAGNOSIS — R0609 Other forms of dyspnea: Secondary | ICD-10-CM | POA: Diagnosis not present

## 2024-06-19 DIAGNOSIS — E782 Mixed hyperlipidemia: Secondary | ICD-10-CM | POA: Insufficient documentation

## 2024-06-19 DIAGNOSIS — I700 Atherosclerosis of aorta: Secondary | ICD-10-CM | POA: Diagnosis not present

## 2024-06-19 DIAGNOSIS — I272 Pulmonary hypertension, unspecified: Secondary | ICD-10-CM | POA: Diagnosis not present

## 2024-06-19 DIAGNOSIS — J432 Centrilobular emphysema: Secondary | ICD-10-CM | POA: Diagnosis not present

## 2024-06-19 MED ORDER — ROSUVASTATIN CALCIUM 10 MG PO TABS: 10.0000 mg | ORAL_TABLET | Freq: Every day | ORAL | 3 refills | Status: AC

## 2024-06-19 NOTE — Patient Instructions (Addendum)
 Medication Instructions:  Your physician recommends the following medication changes.  START TAKING:Rosuvastatin  (CRESTOR ) 10 mg once daily  *If you need a refill on your cardiac medications before your next appointment, please call your pharmacy*  Lab Work: No labs ordered today  If you have labs (blood work) drawn today and your tests are completely normal, you will receive your results only by: MyChart Message (if you have MyChart) OR A paper copy in the mail If you have any lab test that is abnormal or we need to change your treatment, we will call you to review the results.  Testing/Procedures:   Please report to Radiology at the [facility] Main Entrance 30 minutes early for your test.  [address] [place], [place] [address]                         OR   Please report to Radiology at [facility] Main Entrance, medical mall, 30 mins prior to your test.  [address]  [place], [place]  How to Prepare for Your Cardiac PET/CT Stress Test:  Nothing to eat or drink, except water, 3 hours prior to arrival time.  NO caffeine/decaffeinated products, or chocolate 12 hours prior to arrival. (Please note decaffeinated beverages (teas/coffees) still contain caffeine).  If you have caffeine within 12 hours prior, the test will need to be rescheduled.  Medication instructions:  You may take your remaining medications with water.  NO perfume, cologne or lotion on chest or abdomen area. FEMALES - Please avoid wearing dresses to this appointment.  Total time is 1 to 2 hours; you may want to bring reading material for the waiting time.  In preparation for your appointment, medication and supplies will be purchased.  Appointment availability is limited, so if you need to cancel or reschedule, please call the Radiology Department Scheduler at [phone] 24 hours in advance to avoid a cancellation fee of $100.00  What to Expect When you  Arrive:  Once you arrive and check in for your appointment, you will be taken to a preparation room within the Radiology Department.  A technologist or Nurse will obtain your medical history, verify that you are correctly prepped for the exam, and explain the procedure.  Afterwards, an IV will be started in your arm and electrodes will be placed on your skin for EKG monitoring during the stress portion of the exam. Then you will be escorted to the PET/CT scanner.  There, staff will get you positioned on the scanner and obtain a blood pressure and EKG.  During the exam, you will continue to be connected to the EKG and blood pressure machines.  A small, safe amount of a radioactive tracer will be injected in your IV to obtain a series of pictures of your heart along with an injection of a stress agent.    After your Exam:  It is recommended that you eat a meal and drink a caffeinated beverage to counter act any effects of the stress agent.  Drink plenty of fluids for the remainder of the day and urinate frequently for the first couple of hours after the exam.  Your doctor will inform you of your test results within 7-10 business days.  For more information and frequently asked questions, please visit our website: [email or web address]  For questions about your test or how to prepare for your test, please call: Cardiac Imaging Nurse Navigators Office: [phone]  ZIO XT- Long Term Monitor Instructions  Your physician has requested you wear a ZIO patch monitor for 14 days.  This is a single patch monitor. [employer] supplies one patch monitor per enrollment. Additional stickers are not available. Please do not apply patch if you will be having a Nuclear Stress Test, Echocardiogram, Cardiac CT, MRI, or Chest Xray during the period you would be wearing the monitor. The patch cannot be worn during these tests. You cannot remove and re-apply the ZIO XT patch monitor.  Your ZIO patch  monitor will be mailed 3 day USPS to your address on file. It may take 3-5 days to receive your monitor after you have been enrolled. Once you have received your monitor, please review the enclosed instructions. Your monitor has already been registered assigning a specific monitor serial number to you.  Billing and Patient Assistance Program Information  We have supplied [employer] with any of your insurance information on file for billing purposes.  [employer] offers a sliding scale Patient Assistance Program for patients that do not have insurance, or whose insurance does not completely cover the cost of the ZIO monitor.  You must apply for the Patient Assistance Program to qualify for this discounted rate.  To apply, please call [employer] at [phone], select option 4, select option 2, ask to apply for Patient Assistance Program. Meredeth will ask your household income, and how many people are in your household. They will quote your out-of-pocket cost based on that information. [employer] will also be able to set up a 67-month, interest-free payment plan if needed.  Applying the monitor  Hold abrader disc by orange tab. Rub abrader in 40 strokes over the upper left chest as indicated in your monitor instructions.  Clean area with 4 enclosed alcohol pads. Let dry.  Apply patch as indicated in monitor instructions. Patch will be placed under collarbone on left side of chest with arrow pointing upward.  Rub patch adhesive wings for 2 minutes. Remove white label marked 1. Remove the white label marked 2. Rub patch adhesive wings for 2 additional minutes.  While looking in a mirror, press and release button in center of patch. A small green light will flash 3-4 times. This will be your only indicator that the monitor has been turned on.   After Applying Monitor: Do not shower for the first 24 hours. You may shower after the first 24 hours. Keep your back toward the water; no submersion Press the button if  you feel a symptom. You will hear a small click. Record Date, Time and Symptom in the Patient Logbook.   After Completing 14 Days: When you are ready to remove the patch, follow instructions on the last 2 pages of Patient Logbook.  Stick patch monitor into the tabs at the bottom of the return box.  Place Patient Logbook in the blue and white box. Use locking tab on box and tape box closed securely. The blue and white box has prepaid postage on it. Please place it in the mailbox as soon as possible. Your physician should have your test results approximately 7-14 days after the monitor has been mailed back to [facility].   Troubleshooting: Call [facility] at [phone] if you have questions regarding your ZIO XT patch monitor.  Call them immediately if you see an orange light blinking on your monitor.  If your monitor falls off in less than 4 days, contact our Monitor department at [phone].  If your monitor becomes loose or  falls off after 4 days call [employer] at [phone] for suggestions on securing your monitor.   Follow-Up: At [facility], you and your health needs are our priority.  As part of our continuing mission to provide you with exceptional heart care, our providers are all part of one team.  This team includes your primary Cardiologist (physician) and Advanced Practice Providers or APPs (Physician Assistants and Nurse Practitioners) who all work together to provide you with the care you need, when you need it.  Your next appointment:   3 month(s)  Provider:   Caron Poser, MD    We recommend signing up for the patient portal called MyChart.  Sign up information is provided on this After Visit Summary.  MyChart is used to connect with patients for Virtual Visits (Telemedicine).  Patients are able to view lab/test results, encounter notes, upcoming appointments, etc.  Non-urgent messages can be sent to your provider as well.   To learn  more about what you can do with MyChart, go to [email or web address].

## 2024-06-21 ENCOUNTER — Encounter: Payer: Self-pay | Admitting: Urgent Care

## 2024-06-21 LAB — CULTURE, FUNGUS WITHOUT SMEAR: Special Requests: NORMAL

## 2024-06-27 ENCOUNTER — Other Ambulatory Visit: Payer: Self-pay | Admitting: *Deleted

## 2024-07-12 ENCOUNTER — Telehealth: Payer: Self-pay

## 2024-07-12 ENCOUNTER — Ambulatory Visit: Admitting: Internal Medicine

## 2024-07-12 ENCOUNTER — Ambulatory Visit: Payer: Self-pay

## 2024-07-12 ENCOUNTER — Encounter: Payer: Self-pay | Admitting: Internal Medicine

## 2024-07-12 ENCOUNTER — Encounter (HOSPITAL_BASED_OUTPATIENT_CLINIC_OR_DEPARTMENT_OTHER): Payer: Self-pay | Admitting: Pulmonary Disease

## 2024-07-12 ENCOUNTER — Encounter: Payer: Self-pay | Admitting: Emergency Medicine

## 2024-07-12 VITALS — BP 118/60 | HR 86 | Temp 97.9°F | Ht <= 58 in | Wt 108.0 lb

## 2024-07-12 DIAGNOSIS — R0683 Snoring: Secondary | ICD-10-CM

## 2024-07-12 DIAGNOSIS — R5383 Other fatigue: Secondary | ICD-10-CM

## 2024-07-12 DIAGNOSIS — I2510 Atherosclerotic heart disease of native coronary artery without angina pectoris: Secondary | ICD-10-CM | POA: Diagnosis not present

## 2024-07-12 DIAGNOSIS — R0609 Other forms of dyspnea: Secondary | ICD-10-CM | POA: Diagnosis not present

## 2024-07-12 DIAGNOSIS — G4710 Hypersomnia, unspecified: Secondary | ICD-10-CM

## 2024-07-12 DIAGNOSIS — F17200 Nicotine dependence, unspecified, uncomplicated: Secondary | ICD-10-CM

## 2024-07-12 MED ORDER — FLUTICASONE-SALMETEROL 500-50 MCG/ACT IN AEPB: 1.0000 | INHALATION_SPRAY | Freq: Two times a day (BID) | RESPIRATORY_TRACT | 5 refills | Status: AC

## 2024-07-12 NOTE — Progress Notes (Signed)
 " [facility] [place] [facility] Consultation      Date: 07/12/2024,   MRN# [id number] [name] 1960/11/14    CHIEF COMPLAINT:   Assessment of shortness of breath Assessment for sleep apnea   HISTORY OF PRESENT ILLNESS  64 y.o. Y female female presented to [facility] ED 05/28/24 via EMS for AMS, headaches, gait disturbance 2 days   She came to ED where she was agitated, somewhat combative, confused, moaning non sensible words. CT head was negative. UDS positive for benzos and amphetamines. Patient later admitted taking ecstasy pill from her coworker.    +hypertensive to 190s systolic and tachycardic to 120s. She had fever to 101.2 rectally.   On exam she was profoundly hypovolemic and fluid resuscitated. 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  Patient was severely agitated.  She was moved to ICU, intubated for LP on 12/23, extubated 12/24.  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   Symptoms gradually improved.  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  Additionally small acute infarct in the posterior left frontal white matter and left basal ganglia.  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 Alcoholic encephalopathy is also a consideration however, no changes.  EEG was negative for epileptic activity   Evaluated by neurology.  Her MRI changes were overall thought to be secondary to uncontrolled blood pressure/PR ES   She was initiated on high-dose thiamine , also started on aspirin .  Blood pressure management.   Patient  has been having sleep problems for many years Patient has been having excessive daytime sleepiness for a long time Patient has been having extreme fatigue and tiredness, lack of energy     07/12/2024    1:00 PM  Results of the Epworth flowsheet  Sitting and reading 3   Watching TV 0  Sitting, inactive in a public place (e.g. a theatre or a meeting) 0  As a passenger in a car for an hour without a break 0  Lying down to rest in the afternoon when circumstances permit 3  Sitting and talking to someone 3  Sitting quietly after a lunch without alcohol 3  In a car, while stopped for a few minutes in traffic 0  Total score 12    Patient has extensive smoking history 1 pack a day for the last 40 years Patient has increased work of breathing and shortness of breath Have explained to her that she likely has diagnosis of underlying COPD We will need further evaluation and testing We will start inhaled corticosteroids and long-acting beta agonist At this time no evidence of COPD exacerbation    PAST MEDICAL HISTORY   Past Medical History:  Diagnosis Date   Acute diverticulitis 07/16/2013   Acute renal failure 07/16/2013   Arthritis    hands, back, knees   Colitis 07/16/2013   Diverticulitis    Elevated CEA 08/15/2017   GERD (gastroesophageal reflux disease)    GIB (gastrointestinal bleeding) 01/07/2012   Hypertension    somewhat controlled, taking medication   Hypothyroidism    Intractable nausea and vomiting 05/30/2022   Lesion of epiglottis    Macrocytosis without anemia 08/15/2017   Orthostasis 08/22/2017     SURGICAL HISTORY   Past Surgical History:  Procedure Laterality Date   ABDOMINAL HYSTERECTOMY     APPENDECTOMY     BIOPSY  12/04/2022   Procedure: BIOPSY;  Surgeon: Albertus Heinz  CHRISTELLA, MD;  Location: [facility] ENDOSCOPY;  Service: Gastroenterology;;   CHOLECYSTECTOMY     COLONOSCOPY  01/09/2012   Procedure: COLONOSCOPY;  Surgeon: Jerrell KYM Sol, MD;  Location: [facility] ENDOSCOPY;  Service: Endoscopy;  Laterality: N/A;   COLONOSCOPY WITH PROPOFOL  N/A 08/25/2017   Procedure: COLONOSCOPY WITH PROPOFOL ;  Surgeon: Therisa Bi, MD;  Location: [facility] ENDOSCOPY;  Service: Gastroenterology;  Laterality: N/A;   ESOPHAGOGASTRODUODENOSCOPY   01/07/2012   Procedure: ESOPHAGOGASTRODUODENOSCOPY (EGD);  Surgeon: Lynwood LITTIE Celestia Mickey., MD;  Location: [facility] ENDOSCOPY;  Service: Endoscopy;  Laterality: N/A;   ESOPHAGOGASTRODUODENOSCOPY (EGD) WITH PROPOFOL  N/A 08/25/2017   Procedure: ESOPHAGOGASTRODUODENOSCOPY (EGD) WITH PROPOFOL ;  Surgeon: Therisa Bi, MD;  Location: [facility] ENDOSCOPY;  Service: Gastroenterology;  Laterality: N/A;   ESOPHAGOGASTRODUODENOSCOPY (EGD) WITH PROPOFOL  N/A 12/04/2022   Procedure: ESOPHAGOGASTRODUODENOSCOPY (EGD) WITH PROPOFOL ;  Surgeon: Albertus Gordy CHRISTELLA, MD;  Location: [facility] ENDOSCOPY;  Service: Gastroenterology;  Laterality: N/A;   VIDEO BRONCHOSCOPY Bilateral 05/14/2016   Procedure: VIDEO BRONCHOSCOPY WITHOUT FLUORO;  Surgeon: Lamar GORMAN Chris, MD;  Location: [facility] ENDOSCOPY;  Service: Cardiopulmonary;  Laterality: Bilateral;     FAMILY HISTORY   Family History  Problem Relation Age of Onset   Breast cancer Mother        multiple types of cancer   Stomach cancer Mother    Throat cancer Mother    Melanoma Mother    Kidney disease Father      SOCIAL HISTORY   Social History[1]   MEDICATIONS    Home Medication:  Current Outpatient Rx   Order #: [id number] Class: Normal   Order #: [id number] Class: Historical Med   Order #: [id number] Class: Normal   Order #: [id number] Class: Historical Med   Order #: [id number] Class: Normal   Order #: [id number] Class: Historical Med   Order #: [id number] Class: Historical Med   Order #: [id number] Class: Historical Med   Order #: [id number] Class: Normal   Order #: [id number] Class: Normal   Order #: [id number] Class: Historical Med   Order #: [id number] Class: Historical Med   Order #: [id number] Class: Normal   Order #: [id number] Class: Historical Med   Order #: [id number] Class: Historical Med   Order #: [id number] Class: Normal   Order #: [id number] Class: Normal   Order #: [id number] Class: Historical Med    Current Medication: Current Medications[2]    ALLERGIES    Patient has no known allergies.   BP 118/60   Pulse 86   Temp 97.9 F (36.6 C)   Ht 4' 10 (1.473 m)   Wt 108 lb (49 kg)   SpO2 96%   BMI 22.57 kg/m    Review of Systems: Gen:  Denies  fever, sweats, chills weight loss  HEENT: Denies blurred vision, double vision, ear pain, eye pain, hearing loss, nose bleeds, sore throat Cardiac:  No dizziness, chest pain or heaviness, chest tightness,edema, No JVD Resp:   No cough, -sputum production, +shortness of breath,-wheezing, -hemoptysis,  Other:  All other systems negative   Physical Examination:   General Appearance: No distress  EYES PERRLA, EOM intact.   NECK Supple, No JVD Pulmonary: normal breath sounds, No wheezing.  CardiovascularNormal S1,S2.  No m/r/g.   Abdomen: Benign, Soft, non-tender. Neurology UE/LE 5/5 strength, no focal deficits Ext pulses intact, cap refill intact ALL OTHER ROS ARE NEGATIVE      IMAGING    LONG TERM MONITOR (3-14 DAYS) Result Date: 07/12/2024 Franne LITTIE Banter had a 14-day Zio monitor which was within normal limits. The monitor revealed predominantly sinus rhythm with mean HR of 94 bpm (range 50 -  133). There were rare PACs and rare supraventricular couplets/triplets. There were rare PVCs and rare ventricular couplets/triplets. There were 2 patient triggers which corresponded to sinus rhythm. There was a single 5 beat salvo of SVT (max HR 164 bpm), likely pAT. 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 with a history of acute strokes with extensive smoking history with progressive shortness of breath and dyspnea exertion are likely related to underlying COPD, associated with excessive daytime sleepiness  Assessment of COPD Recommend pulmonary function testing Lets plan to start inhaled corticosteroid and long-acting beta agonist Continue  albuterol  as needed No indication for antibiotics or prednisone  at this time Avoid Allergens and Irritants Avoid secondhand smoke Avoid SICK contacts Recommend  Masking  when appropriate Recommend Keep up-to-date with vaccinations    Assessment of sleep apnea Recommend home sleep test for further evaluation    Active smoker smoking cessation strongly advised Smoking Assessment and Cessation Counseling Upon further questioning, Patient smokes 1/4 pack I have advised patient to quit/stop smoking as soon as possible due to high risk for multiple medical problems Patient is willing to quit smoking  I have advised patient that we can assist and have options of Nicotine  replacement therapy. I also advised patient on behavioral therapy and can provide oral medication therapy in conjunction with the other therapies Follow up next Office visit  for assessment of smoking cessation Smoking cessation counseling advised for 4 minutes    Extensive smoking history high risk for cancer Recommend lung low-dose CT scan screening referral program      MEDICATION ADJUSTMENTS/LABS AND TESTS ORDERED: Start Wixela 2 puffs twice daily Rinse mouth after use Obtain pulmonary function testing Obtain home sleep study Low-dose CT chest referral lung cancer screening   CURRENT MEDICATIONS REVIEWED AT LENGTH WITH PATIENT TODAY   Patient  satisfied with Plan of action and management. All questions answered   Follow up 3 months   I spent a total of 65 minutes dedicated to the care of this patient on the date of this encounter to include pre-visit review of records, face-to-face time with the patient discussing conditions above, post visit ordering of testing, clinical documentation with the electronic health record, making appropriate referrals as documented, and communicating necessary information to the patient's healthcare team.    The Patient requires high complexity decision making for  assessment and support, frequent evaluation and titration of therapies, application of advanced monitoring technologies and extensive interpretation of multiple databases.  Patient satisfied with Plan of action and management. All questions answered    Nickolas Alm Cellar, M.D.  [facility]  Medical Director [facility]                  [1] Social History Tobacco Use   Smoking status: Some Days    Current packs/day: 0.25    Average packs/day: 2.0 packs/day for 42.0 years (84.0 ttl pk-yrs)    Types: Cigarettes    Start date: 07/06/1973    Last attempt to quit: 07/07/2015   Smokeless tobacco: Never   Tobacco comments:    Patient states she smoke 1-2 cigarettes a day 07/12/24 although she is currently wearing a patch.  Vaping Use   Vaping status: Former  Substance Use Topics   Alcohol use: Yes    Alcohol/week: 35.0 standard drinks of alcohol    Types:  7 Glasses of wine, 28 Shots of liquor per week   Drug use: No  [2]  Current Outpatient Medications:    albuterol  (PROVENTIL ) (2.5 MG/3ML) 0.083% nebulizer solution, Take 3 mLs (2.5 mg total) by nebulization every 4 (four) hours as needed for shortness of breath., Disp: 75 mL, Rfl: 2   albuterol  (VENTOLIN  HFA) 108 (90 Base) MCG/ACT inhaler, Inhale 2 puffs into the lungs every 6 (six) hours as needed for wheezing or shortness of breath., Disp: , Rfl:    amLODipine  (NORVASC ) 5 MG tablet, Take 1 tablet (5 mg total) by mouth daily., Disp: 30 tablet, Rfl: 0   amphetamine-dextroamphetamine (ADDERALL) 5 MG tablet, Take 5 mg by mouth daily., Disp: , Rfl:    aspirin  81 MG chewable tablet, Chew 1 tablet (81 mg total) by mouth daily., Disp: 90 tablet, Rfl: 3   Biotin 5 MG TABS, Take 1 tablet by mouth daily., Disp: , Rfl:    Cholecalciferol  125 MCG (5000 UT) TABS, Take 10,000 Units by mouth daily., Disp: , Rfl:    cyclobenzaprine  (FLEXERIL ) 10 MG tablet, Take 10 mg by mouth 2 (two)  times daily as needed., Disp: , Rfl:    ferrous sulfate  325 (65 FE) MG tablet, Take 1 tablet (325 mg total) by mouth 2 (two) times daily with a meal., Disp: 60 tablet, Rfl: 0   folic acid  (FOLVITE ) 1 MG tablet, Take 1 tablet (1 mg total) by mouth daily., Disp: 30 tablet, Rfl: 2   HYDROcodone -acetaminophen  (NORCO) 10-325 MG tablet, Take 1 tablet by mouth 3 (three) times daily as needed., Disp: , Rfl:    levothyroxine  (SYNTHROID ) 88 MCG tablet, Take 88 mcg by mouth daily., Disp: , Rfl:    losartan  (COZAAR ) 100 MG tablet, Take 1 tablet (100 mg total) by mouth daily., Disp: 30 tablet, Rfl: 0   naloxone (NARCAN) nasal spray 4 mg/0.1 mL, Place 1 spray into the nose once., Disp: , Rfl:    pregabalin (LYRICA) 50 MG capsule, Take 50 mg by mouth 2 (two) times daily., Disp: , Rfl:    rosuvastatin  (CRESTOR ) 10 MG tablet, Take 1 tablet (10 mg total) by mouth daily., Disp: 90 tablet, Rfl: 3   thiamine  (VITAMIN B1) 100 MG tablet, Take 2 tablets daily for 5 days followed by 1 tablet daily, Disp: 60 tablet, Rfl: 0   traZODone  (DESYREL ) 50 MG tablet, Take 50-100 mg by mouth at bedtime., Disp: , Rfl:  "

## 2024-07-12 NOTE — Telephone Encounter (Signed)
 Copied from CRM [id number]. Topic: Clinical - Prescription Issue >> Jul 12, 2024  3:48 PM Dedra B wrote: Reason for CRM: Patient said her insurance will not cover fluticasone -salmeterol (WIXELA INHUB) 500-50 MCG/ACT AEPB.

## 2024-07-12 NOTE — Patient Instructions (Addendum)
 Please Stop Smoking You've been referred to the Lung Cancer Screening Program for Annual Chest CT's. They will call you to get set up. Please complete your Home Sleep Study, or let us  know if insurance denies and we can switch to an In-Lab sleep study. Please schedule your Breathing Test at the front desk to be completed prior to your follow up visit in 3 months.           P.S. Check out Doc Brayton since you're interested in new shoes.

## 2024-07-13 ENCOUNTER — Other Ambulatory Visit: Payer: Self-pay | Admitting: *Deleted

## 2024-07-13 ENCOUNTER — Encounter: Payer: Self-pay | Admitting: Urgent Care

## 2024-07-13 DIAGNOSIS — I351 Nonrheumatic aortic (valve) insufficiency: Secondary | ICD-10-CM

## 2024-07-13 DIAGNOSIS — I2510 Atherosclerotic heart disease of native coronary artery without angina pectoris: Secondary | ICD-10-CM

## 2024-07-13 DIAGNOSIS — I700 Atherosclerosis of aorta: Secondary | ICD-10-CM

## 2024-07-13 NOTE — Progress Notes (Signed)
 Insurance denied Coronary CT scan; Dr. Argentina ordered Myoview in hopes insurance approves procedure for patient to evaluate pt's CAD.

## 2024-07-19 ENCOUNTER — Other Ambulatory Visit

## 2024-08-27 ENCOUNTER — Ambulatory Visit: Admitting: Neurology

## 2024-09-18 ENCOUNTER — Ambulatory Visit

## 2024-10-18 ENCOUNTER — Ambulatory Visit: Admitting: Internal Medicine

## 2024-10-18 ENCOUNTER — Encounter
